# Patient Record
Sex: Female | Born: 1950
Health system: Southern US, Community
[De-identification: ages and names within clinical notes are randomized; demographics above are authoritative.]

## PROBLEM LIST (undated history)

## (undated) DIAGNOSIS — I2510 Atherosclerotic heart disease of native coronary artery without angina pectoris: Secondary | ICD-10-CM

## (undated) DIAGNOSIS — I10 Essential (primary) hypertension: Secondary | ICD-10-CM

## (undated) DIAGNOSIS — M1990 Unspecified osteoarthritis, unspecified site: Secondary | ICD-10-CM

## (undated) DIAGNOSIS — D649 Anemia, unspecified: Secondary | ICD-10-CM

## (undated) DIAGNOSIS — K5790 Diverticulosis of intestine, part unspecified, without perforation or abscess without bleeding: Secondary | ICD-10-CM

## (undated) DIAGNOSIS — K219 Gastro-esophageal reflux disease without esophagitis: Secondary | ICD-10-CM

## (undated) DIAGNOSIS — R519 Headache, unspecified: Secondary | ICD-10-CM

## (undated) DIAGNOSIS — K589 Irritable bowel syndrome without diarrhea: Secondary | ICD-10-CM

## (undated) DIAGNOSIS — M109 Gout, unspecified: Secondary | ICD-10-CM

## (undated) DIAGNOSIS — F40232 Fear of other medical care: Secondary | ICD-10-CM

## (undated) DIAGNOSIS — R000 Tachycardia, unspecified: Secondary | ICD-10-CM

## (undated) DIAGNOSIS — R011 Cardiac murmur, unspecified: Secondary | ICD-10-CM

## (undated) DIAGNOSIS — Z5189 Encounter for other specified aftercare: Secondary | ICD-10-CM

## (undated) DIAGNOSIS — K5792 Diverticulitis of intestine, part unspecified, without perforation or abscess without bleeding: Secondary | ICD-10-CM

## (undated) DIAGNOSIS — Z8719 Personal history of other diseases of the digestive system: Secondary | ICD-10-CM

## (undated) HISTORY — PX: CATARACT EXTRACTION: SUR2

## (undated) HISTORY — DX: Anemia, unspecified: D64.9

## (undated) HISTORY — DX: Irritable bowel syndrome, unspecified: K58.9

## (undated) HISTORY — DX: Diverticulitis of intestine, part unspecified, without perforation or abscess without bleeding: K57.92

## (undated) HISTORY — DX: Personal history of other diseases of the digestive system: Z87.19

## (undated) HISTORY — PX: PTCA: SHX146

## (undated) HISTORY — DX: Gastro-esophageal reflux disease without esophagitis: K21.9

## (undated) HISTORY — PX: REPLACEMENT TOTAL KNEE: SUR1224

## (undated) HISTORY — PX: VAGINAL HYSTERECTOMY: SUR661

## (undated) HISTORY — DX: Atherosclerotic heart disease of native coronary artery without angina pectoris: I25.10

## (undated) HISTORY — DX: Diverticulosis of intestine, part unspecified, without perforation or abscess without bleeding: K57.90

## (undated) HISTORY — DX: Encounter for other specified aftercare: Z51.89

## (undated) HISTORY — PX: COLON SURGERY: SHX602

## (undated) HISTORY — DX: Unspecified osteoarthritis, unspecified site: M19.90

---

## 1998-08-07 HISTORY — PX: COLONOSCOPY: SHX174

## 1999-04-18 ENCOUNTER — Other Ambulatory Visit: Admission: RE | Admit: 1999-04-18 | Discharge: 1999-04-18 | Payer: Self-pay | Admitting: Gastroenterology

## 1999-04-18 ENCOUNTER — Encounter (INDEPENDENT_AMBULATORY_CARE_PROVIDER_SITE_OTHER): Payer: Self-pay | Admitting: Specialist

## 2000-05-23 ENCOUNTER — Emergency Department (HOSPITAL_COMMUNITY): Admission: EM | Admit: 2000-05-23 | Discharge: 2000-05-23 | Payer: Self-pay | Admitting: Emergency Medicine

## 2001-08-07 HISTORY — PX: CHOLECYSTECTOMY: SHX55

## 2003-04-28 ENCOUNTER — Other Ambulatory Visit: Admission: RE | Admit: 2003-04-28 | Discharge: 2003-04-28 | Payer: Self-pay | Admitting: Family Medicine

## 2004-06-14 ENCOUNTER — Emergency Department (HOSPITAL_COMMUNITY): Admission: EM | Admit: 2004-06-14 | Discharge: 2004-06-14 | Payer: Self-pay | Admitting: Emergency Medicine

## 2004-07-15 ENCOUNTER — Ambulatory Visit (HOSPITAL_COMMUNITY): Admission: RE | Admit: 2004-07-15 | Discharge: 2004-07-15 | Payer: Self-pay | Admitting: *Deleted

## 2004-08-07 DIAGNOSIS — Z8719 Personal history of other diseases of the digestive system: Secondary | ICD-10-CM

## 2004-08-07 HISTORY — PX: COLONOSCOPY: SHX174

## 2004-08-07 HISTORY — DX: Personal history of other diseases of the digestive system: Z87.19

## 2004-08-16 ENCOUNTER — Ambulatory Visit: Payer: Self-pay | Admitting: Gastroenterology

## 2004-08-16 ENCOUNTER — Inpatient Hospital Stay (HOSPITAL_COMMUNITY): Admission: EM | Admit: 2004-08-16 | Discharge: 2004-08-22 | Payer: Self-pay | Admitting: Emergency Medicine

## 2004-09-01 ENCOUNTER — Ambulatory Visit: Payer: Self-pay | Admitting: Gastroenterology

## 2005-04-17 ENCOUNTER — Ambulatory Visit: Payer: Self-pay | Admitting: Gastroenterology

## 2005-11-13 ENCOUNTER — Ambulatory Visit: Payer: Self-pay | Admitting: Gastroenterology

## 2005-12-05 ENCOUNTER — Ambulatory Visit: Payer: Self-pay | Admitting: Family Medicine

## 2006-01-02 ENCOUNTER — Ambulatory Visit: Payer: Self-pay | Admitting: Family Medicine

## 2006-09-03 ENCOUNTER — Inpatient Hospital Stay (HOSPITAL_COMMUNITY): Admission: RE | Admit: 2006-09-03 | Discharge: 2006-09-06 | Payer: Self-pay | Admitting: Orthopedic Surgery

## 2006-09-26 ENCOUNTER — Encounter: Admission: RE | Admit: 2006-09-26 | Discharge: 2006-11-08 | Payer: Self-pay | Admitting: Orthopedic Surgery

## 2007-08-08 DIAGNOSIS — IMO0001 Reserved for inherently not codable concepts without codable children: Secondary | ICD-10-CM

## 2007-08-08 DIAGNOSIS — Z5189 Encounter for other specified aftercare: Secondary | ICD-10-CM

## 2007-08-08 HISTORY — PX: JOINT REPLACEMENT: SHX530

## 2007-08-08 HISTORY — DX: Encounter for other specified aftercare: Z51.89

## 2007-08-08 HISTORY — DX: Reserved for inherently not codable concepts without codable children: IMO0001

## 2009-07-21 ENCOUNTER — Encounter (INDEPENDENT_AMBULATORY_CARE_PROVIDER_SITE_OTHER): Payer: Self-pay | Admitting: *Deleted

## 2010-03-23 ENCOUNTER — Telehealth: Payer: Self-pay | Admitting: Gastroenterology

## 2010-08-28 ENCOUNTER — Encounter: Payer: Self-pay | Admitting: Family Medicine

## 2010-09-06 NOTE — Progress Notes (Signed)
Summary: Schedule Colonoscopy  Phone Note Outgoing Call Call back at [facility] Phone [phone]   Call placed by: Harlow Mares CMA [facility]),  March 23, 2010 11:34 AM Call placed to: Patient Summary of Call: called patient she states she never recieved a letter about her colonoscopy. She has no insurance so i gave her the number for patient assistance and she will call them and call our office back to schedule her colonoscopy if she can get some assistance.  Initial call taken by: Harlow Mares CMA [facility]),  March 23, 2010 11:36 AM

## 2010-10-17 ENCOUNTER — Telehealth (INDEPENDENT_AMBULATORY_CARE_PROVIDER_SITE_OTHER): Payer: Self-pay | Admitting: *Deleted

## 2010-10-25 NOTE — Progress Notes (Signed)
  Phone Note Other Incoming   Request: Send information Summary of Call: Request for records received from [facility]. Request forwarded to [employer].  10-2007

## 2010-10-31 ENCOUNTER — Ambulatory Visit (HOSPITAL_COMMUNITY)
Admission: RE | Admit: 2010-10-31 | Discharge: 2010-10-31 | Disposition: A | Discharge status: Home / Self Care | Payer: Self-pay | Source: Ambulatory Visit | Attending: Family Medicine | Admitting: Family Medicine

## 2010-10-31 ENCOUNTER — Other Ambulatory Visit (HOSPITAL_COMMUNITY): Payer: Self-pay | Admitting: Family Medicine

## 2010-10-31 DIAGNOSIS — M545 Low back pain, unspecified: Secondary | ICD-10-CM

## 2010-12-23 NOTE — Op Note (Signed)
NAME:  Stephanie Hammond, Stephanie Hammond             ACCOUNT NO.:  [id number]   MEDICAL RECORD NO.:  [id number]          PATIENT TYPE:  INP   LOCATION:  5029                         FACILITY:  [facility]   PHYSICIAN:  Loreta Ave, M.D. DATE OF BIRTH:  1951-06-14   DATE OF PROCEDURE:  DATE OF DISCHARGE:                               OPERATIVE REPORT   PREOPERATIVE DIAGNOSIS:  Endstage degenerative arthritis, both knees,  right greater than left.   POSTOPERATIVE DIAGNOSIS:  Endstage degenerative arthritis, both knees,  right greater than left.   OPERATION/PROCEDURE:  1. Right total knee replacement with Stryker prosthesis. Cemented #4      Triathlon femoral component pegged, posterior stabilized.  Cemented      #4 tibial component with a 9-mm polyethylene posterior stabilized      insert. Resurfacing 32 mm x 10 mm medial offset patellar component.      Soft tissue balancing.  2. Intraarticular injection left knee with Depo-Medrol and Marcaine.   SURGEON:  Loreta Ave, M.D.   ASSISTANT:  Genene Churn. Barry Dienes, [place] .  Present throughout the entire case.   ANESTHESIA:  General.   ESTIMATED BLOOD LOSS:  Minimal.   TOURNIQUET TIME:  1 hour 30 minutes.   SPECIMENS:  None.   CULTURES:  None.   COMPLICATIONS:  None.   DRESSINGS:  Soft compressive with knee immobilizer.   DRAINS:  Hemovac x1.   PROCEDURE:  The patient brought to the operating room and after adequate  anesthesia had been obtained, both knees examined.  Moderate exogenous  obesity.  Both knees full extension flexion limited to about 100 degrees  both sides, both in slight varus.  Attention turned to the left knee.  Under sterile technique, injected intraarticular with Depo-Medrol 80 mg  and Marcaine 0.5% with epinephrine 4 mL.  Band-aid applied.   Attention to the right knee.  Tourniquet applied.  Prepped and draped in  the usual sterile fashion.  Exsanguinated with elevation Esmarch,  tourniquet to 350 mmHg. A skin incision  above the patella down to the  tibial tubercle.  The skin and subcutaneous tissue divided.  Her body  habitus and obesity really did not allow for a minimally invasive  approach.  Medial arthrotomy extending up into the quadriceps tendon.  Knee exposed.  There are grade IV changes throughout.  Remnants of  menisci, cruciate ligament, loose body.  Articular spurs menisci  excised.  Distal femoral cut with intramedullary guide resecting 10 mm  set at 5 degrees of valgus.  Epicondylar axis marked.  Sized for #4  component.  Jigs put in place, definitive cuts made.   Attention turned to the tibia.  Extramedullary guide, 3-degree posterior  is slope cut, resecting out 13 mm off the peak of the tibial spine.  Sized to a #4 component.  The patella was everted, measured and the  posterior 10 mm resected.  Sized and drilled for a 32-mm component.  All  recess examined.  All remnants of spurs, menisci removed.  Wound  irrigated.  Trials put in place; #4 on the femur, #4 on the tibia and  the  32-mm on the patella with a 9-mm insert.  Full extension and full  flexion.  Good alignment, good stability, good patellofemoral  tracking.  Tibia was marked for rotation and then reamed for the tibial component.  All trials removed. Copious irrigation with the pulse irrigating device.  Cement prepared placed on all components.  All components firmly seated.  Excessive cement removed.  Polyethylene attached to the tibial component  and the knee reduced.  Once the cement hardened, the knee was  reexamined.  Full extension and full flexion good.  Alignment good.  Stability good.  Patellofemoral tracking.  Hemovac placed through the a  simple stab wound.  Wound irrigated.  Arthrotomy closed with #1 Vicryl,  skin and subcutaneous tissue with Vicryl and staples.  Knee injected  with Marcaine and a Hemovac clamp.  Sterile compressive dressing  applied.  Tourniquet deflated and removed.  Knee immobilizer applied.   Anesthesia reversed.  Brought to recovery room.  Tolerated surgery well,  no complications.      Loreta Ave, M.D.  Electronically Signed     DFM/MEDQ  D:  09/04/2006  T:  09/05/2006  Job:  [id number]

## 2010-12-23 NOTE — Discharge Summary (Signed)
NAME:  Stephanie Hammond, Stephanie Hammond             ACCOUNT NO.:  [id number]   MEDICAL RECORD NO.:  [id number]          PATIENT TYPE:  INP   LOCATION:  5029                         FACILITY:  [facility]   PHYSICIAN:  Loreta Ave, M.D. DATE OF BIRTH:  1950-08-17   DATE OF ADMISSION:  09/03/2006  DATE OF DISCHARGE:  09/06/2006                               DISCHARGE SUMMARY   FINAL DIAGNOSES:  1. Status post right total knee replacement for end-stage degenerative      joint disease.  2. Diverticulosis.   HISTORY OF PRESENT ILLNESS:  A 60 year old black female with a history  of end-stage degenerative joint disease, right knee, with chronic pain.  Presented to our office for prep/evaluation for total knee replacement.  She had progressive worsening pain which failed conservative treatment.  Significant decrease in her daily activities.   HOSPITAL COURSE:  On September 03, 2006, the patient was taken to the  [facility] OR, and right total knee replacement procedure performed.   SURGEON:  Dr. Reuel Boom.  __________   ASSISTANT:  Zonia Kief, PA-C.   ANESTHESIA:  General.   SPECIMENS:  None.   ESTIMATED BLOOD LOSS:  Minimal.   TOURNIQUET TIME:  80 minutes.   Hemovac drain placed x1.  There were no surgical or anesthesia  complications, and patient was transferred to recovery in stable  condition.  October 05, 2006 patient doing well.  Vital signs stable.  Afebrile.  Hemoglobin 11.0.  He has a history of diverticulosis and  previous admission for GI bleed.  She was given Lovenox 40 mg subcu  injections for DVT prophylaxis.  __________ Neurovascularly intact.  Started PT/OT.  September 05, 2006 patient doing well, with good pain  control.  No specific complaints __________.  Vital signs stable.  Afebrile.  Hemoglobin 10.1.  Wound looks good.  Staples intact.  Discontinue Hemovac drain.  No signs of infection.  Calf nontender.  Neurovascularly intact.  Discontinue the PCA, Foley.  Saline lock IV.  September 06, 2006 patient doing well.  Progressed good with therapy, and  states that she is ready to go home.  Temp 97.6.  Pulse 104.  Respirations 20.  Blood pressure 135/75.  Wound looked good, with  staples intact.  No drainage or signs of infection.  Calf nontender.   CONDITION:  Good and stable.   DISPOSITION:  Discharged home.   DISCHARGE MEDICATIONS:  1. Percocet 7.5/325 tabs p.o. q.4-6 hours p.r.n. for pain.  2. Lovenox 40 mg subcu injection daily x3 weeks for DVT prophylaxis.  3. Robaxin 500 mg 1 tab p.o. q.6 hours p.r.n. for spasms.   DISCHARGE INSTRUCTIONS:  Patient will work with home health PT and OT to  improve range of motion, strength, and ambulation.  Daily dressing  changes with 4 x 4, gauze and tape.  Follow up in 2 weeks postop for  recheck.  Return sooner if needed.      Loreta Ave, M.D.  Electronically Signed     DFM/MEDQ  D:  01/07/2007  T:  01/07/2007  Job:  [id number]

## 2010-12-23 NOTE — Discharge Summary (Signed)
NAMEMarland Kitchen  SAHANA, GIANNATTASIO             ACCOUNT NO.:  [id number]   MEDICAL RECORD NO.:  [id number]          PATIENT TYPE:  INP   LOCATION:  0163                         FACILITY[facility]  [facility]   PHYSICIAN:  Wilhemina Bonito. Marina Goodell, M.D. [facility] OF BIRTH:  Apr 13, 1951   DATE OF ADMISSION:  08/16/2004  DATE OF DISCHARGE:  08/22/2004                                 DISCHARGE SUMMARY   ADMITTING DIAGNOSES:  47.  A 60 year old female with an acute gastrointestinal bleed, suspect      diverticular, rule out nonsteroidal anti-inflammatory drugs-induced      small bowel or colonic ulcer or possibly arterial venous malformation      with bleeding aggravated by nonsteroidal anti-inflammatory drugs use.  2.  Coronary artery disease, status post PTCA approximately 10 years ago.  3.  Osteoarthritis.  4.  Status post hysterectomy and cholecystectomy.   DISCHARGE DIAGNOSES:  1.  Stable status post acute major lower gastrointestinal bleed,      diverticular.  2.  Severe anemia secondary to above.  3.  Syncopal episode in hospital secondary to above.  4.  History of colon polyps.  5.  Coronary artery disease, status post PTCA approximately 10 years ago.  6.  Osteoarthritis.  7.  Status post hysterectomy and cholecystectomy.   PROCEDURE:  Colonoscopy.   CONSULTATIONS:  None.   PROCEDURES:  None.   BRIEF HISTORY:  Stephanie Hammond is a pleasant 87 year old African-American female,  known to the [facility] and also had had prior  colonoscopy at [facility] within the past few years.  She relates that she had  been taking Advil on a regular basis for knee pain up to 4 tablets per day  and had also been on Naprosyn which she stopped approximately a week ago.  She had a cortisone injection in her knee last week which had been somewhat  helpful.  She had not had any previous problems with bleeding but was told  at the time of previous colonoscopy that she had two polyps which were  removed.  At this time, she  had had abrupt onset of painless, dark red blood  per rectum at about 3 p.m. and then had six further episodes over the next  few hours.  She was seen by Dr. Montey Hora in [place] and then sent to  the [facility] Emergency Room for evaluation and admission.  On admission,  her hemoglobin was 12.8, BUN was 16, coags were unremarkable.  She was  tachycardic but otherwise hemodynamically stable.   LABORATORY STUDIES:  On January 10, hemoglobin 12.8, hematocrit 36.4, MCV  89.5, platelets 383.  Serial values were obtained.  On January 12,  hemoglobin was 9.7, hematocrit of 27.6.  On January 13, hemoglobin 7.4,  hematocrit of 20.  On January 14, hemoglobin 11.5, hematocrit of 32.6 and on  January 15, hemoglobin 10.5, hematocrit of 29.4 and on January 16,  hemoglobin 9.9, hematocrit 28.2, platelets 291, pro time 12.7, INR 0.9,  electrolytes within normal limits.  BUN on admission 16, creatinine 0.9,  albumin 3.  Liver function studies normal.  UA showed 3-6 WBCs, 0-2  RBCs.   HOSPITAL COURSE:  The patient was admitted to the service of Dr. Claudette Head, who was covering on call.  She was initially placed on telemetry  unit, started with an IV fluid bolus, and then continued on maintenance IV  fluids.  She was covered with IV Protonix and had serial H&Hs. drawn.  Initial hemoglobin was 12.8.  The following morning, she was feeling fine,  had had two bloody stools during the night but no dizziness, shortness of  breath, etc.  Hemoglobin had dropped from 12.8 down to 9.8.  We did discuss  transfusion but have held off at her request.  On January 12, hemoglobin was  quite stable at 9.7.  We reviewed her office records showing she had had  colonoscopy in September 2000 showing marked diverticulosis of the left  colon and two right colon polyps which were removed.  It was decided that  she was due for follow up colonoscopy, and the patient preferred to proceed  with this while in the hospital.   On the evening of January 12, she was  prepping for colonoscopy and had an episode of weakness while in the  bathroom and then apparent syncope.  She was attended to by the nurses, was  arousable, actively having bowel movements, and then had an episode of  vomiting and thereafter stabilized.  Her hemoglobin was repeated stat and at  that time, was 7.4.  She was transfused 2 units of packed RBCs and  transferred to the step-down unit, kept at bed rest, and eventually was  given a third unit of packed RBCs.  She did not have any further bleeding  thereafter, underwent colonoscopy with Dr. Marina Goodell on the 13th showing severe  pandiverticulosis, with blood scattered throughout the colon but no  significant active bleeding.  She was watched for another 48 hours in the  hospital, had no further evidence of bleeding.  Her diet was gradually  advanced.  She tolerated this without difficulty and was discharged home on  the 16th in a stable and improved condition.  She was asked to avoid all  aspirin and NSAIDs, to remain out of work until the week of January 23.  She  was to call for any problems with recurrent bleeding and to follow up with  Dr. Russella Dar in the office on Thursday, January 26, at 10 a.m.  She will need  repeat hemoglobin at that time.  Again, hemoglobin on discharge was 9.9.      AE/MEDQ  D:  08/31/2004  T:  08/31/2004  Job:  [id number]

## 2010-12-23 NOTE — H&P (Signed)
NAME:  Stephanie Hammond, Stephanie Hammond             ACCOUNT NO.:  [id number]   MEDICAL RECORD NO.:  [id number]          PATIENT TYPE:  INP   LOCATION:  0345                         FACILITY:  [facility]   PHYSICIAN:  Malcolm T. Russella Dar, M.D. [facility] OF BIRTH:  1951/01/10   DATE OF ADMISSION:  08/16/2004  DATE OF DISCHARGE:                                HISTORY & PHYSICAL   CHIEF COMPLAINT:  Acute onset of rectal bleeding.   HISTORY:  Stephanie Hammond is a pleasant 60 year old African American female known to  [facility], Dr. Elease Hashimoto, and also had had a previous  colonoscopy at [facility] within the past few years.  The patient relates that  she has been taking Advil on a regular basis for knee pain, up to four/day.  She had also been on Naprosyn, which she stopped one week ago.  She had had  a cortisone injection in her knee last week, which was somewhat helpful.  The patient has not had any prior problems with bleeding.  She says that she  was told that she had two polyps at the time of her colonoscopy and that  these were removed.  At this point, she developed abrupt onset of painless  dark red blood and clots/rectum at about three p.m. and had about six  episodes over a four hour period.  She was seen acutely by Dr. Montey Hora  in [place] and then sent to [facility] Emergency Room for evaluation and  admission.  She was seen by Dr. Russella Dar and evaluated in the ER and admitted  with an acute lower GI bleed for supportive management.   The patient did undergo a cardiac catheterization in December 2005 that  showed normal coronaries.  Apparently she did have an angioplasty  approximately 10 years ago.  The patient was hemodynamically stable at the  time of admission but tachycardiac with a pulse of 125.  Admission  hemoglobin was 12.8, BUN 16.  Coags within normal limits.   MEDICATIONS:  None on a regular basis.  1.  Advil and Naprosyn as above, both of which she has for the most part  stopped, though says that she did take three Advil yesterday.   ALLERGIES:  PENICILLIN, which causes hives and swelling.   PAST MEDICAL HISTORY:  Pertinent for:  1.  Osteoarthritis.  2.  Coronary artery disease status post PTCA approximately 10 years ago.  3.  Smoker.  4.  Colon polyps.  5.  She is status post a remote hysterectomy done about 29 years ago.  6.  Cholecystectomy 2003.   SOCIAL HISTORY:  The patient is married and lives with her husband.  She is  employed as a Surveyor, mining for [employer].  She is a  smoker, 1-1/2 packs/day.  No ETOH.   FAMILY HISTORY:  Negative for colon cancer or polyps.  Otherwise negative  per patient.   REVIEW OF SYSTEMS:  CARDIOVASCULAR:  The patient denies any chest pain or  anginal symptoms.  PULMONARY:  Negative for cough, shortness of breath, or  sputum production.  GENITOURINARY:  Denies.  GASTROINTESTINAL:  As  above.  MUSCULOSKELETAL:  Pertinent for bilateral knee pain, for which she has been  taking anti-inflammatories.   LABORATORY STUDIES:  On admission, WBC 7.7, hemoglobin 12.8, hematocrit of  36.4, MCV of 89.5, platelets 383.  Pro time 12.7, INR of 0.9.  Sodium 140,  potassium 4.4, BUN 16, creatinine 0.9, albumin 3.  Liver function studies  normal.   PHYSICAL EXAMINATION:  GENERAL:  A well-developed obese African American  female in no acute distress.  Alert and oriented x3.  Pleasant.  VITAL SIGNS:  Blood pressure 125/70, pulse 125, respirations 16, temperature  99.2.  HEENT:  Nontraumatic, normocephalic.  EOMI.  PERRLA.  Sclerae anicteric.  NECK:  No JVD or bruits.  CARDIOVASCULAR:  Regular rate and rhythm with S1 and S2.  Tachycardiac.  CHEST:  Clear.  ABDOMEN:  Large, soft, nontender, nondistended.  Normal active bowel sounds.  No palpable mass or hepatosplenomegaly.  RECTAL:  Without lesion.  Bright red blood/rectum per Dr. Dimple Casey and not  repeated.  NEUROLOGIC:  Grossly nonfocal.   IMPRESSION:  68.  A  60 year old female with an acute gastrointestinal bleed.  Suspect      diverticular.  Rule out NSAID induced small bowel or colonic ulcer or      possibly arteriovenous malformations, with the bleeding aggravated by      NSAID use.  Doubt upper gastrointestinal bleed at this time but must be      considered with NSAID use.  2.  Coronary artery disease status post percutaneous transluminal coronary      angioplasty 10 years ago.  3.  Osteoarthritis.  4.  Status post hysterectomy and cholecystectomy.   PLAN:  The patient is admitted to the service of Dr. Claudette Head for IV  fluid hydration, serial H&H, and transfusions as indicated.  We will obtain  her previous colonoscopy report and then depending on the timing of this and  her course, decide on the need for repeat colonoscopy.  If her BUN rises, we  will also need to consider an EGD to rule out an upper source, though doubt  at present.  For details please see the orders.      AE/MEDQ  D:  08/17/2004  T:  08/17/2004  Job:  [id number]   cc:   Magnus Sinning. Dimple Casey, M.D.  [address] [place]  [place] [address]  Fax: [phone]

## 2010-12-23 NOTE — H&P (Signed)
NAME:  Stephanie Hammond, Stephanie Hammond             ACCOUNT NO.:  [id number]   MEDICAL RECORD NO.:  [id number]          PATIENT TYPE:  EMS   LOCATION:  MAJO                         FACILITY:  [facility]   PHYSICIAN:  Vesta Mixer, M.D. DATE OF BIRTH:  [birth year]   DATE OF ADMISSION:  06/14/2004  DATE OF DISCHARGE:                                HISTORY & PHYSICAL   Ms. Tolson is a 60 year old female with a previous history of coronary  artery disease. She is status post percutaneous transluminal coronary  angioplasty approximately ten years ago. She is admitted  to the ER now with  episodes of chest pain.   The patient has been seen by her primary medical doctor for years. She has  not had any recurrent chest pain since her angioplasty 10 years ago. She  does not get any regular exercise. She occasionally has some chest  discomfort when she brings the groceries in or if she climbs stairs.   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 The pain was associated with  some diaphoresis, but did not radiate. It was not really associated with any  shortness of breath. The pain subsided after several minutes and she  presented to her doctor's office at [facility]. She  was transferred to the [facility] emergency room for further evaluation.   She has been pain free since she arrived. She did not require any  nitroglycerin.   CURRENT MEDICATIONS:  None.   ALLERGIES:  PENICILLIN.   PAST MEDICAL HISTORY:  History of coronary artery disease. She is status  post PTCA approximately ten years ago.   SOCIAL HISTORY:  The patient smokes one and a half packs of cigarettes a  day. She does not drink alcohol.   REVIEW OF SYSTEMS:  The patient denies having any problems with eyes, nose,  or throat. She denies any heat or cold intolerance, weight gain or weight  loss. She denies any cough or sputum production. Review  of systems is  reviewed and is essentially negative.   PHYSICAL EXAMINATION:  GENERAL: She is a middle-age female in no acute  distress. She is alert and oriented times three, and her mood and affect are  normal.  VITAL SIGNS: Heart rate is 80, blood pressure 124/71, and she is afebrile.  HEENT: 2+ carotids. She has no bruits. There is no JVD or thyromegaly.  LUNGS: Clear to auscultation.  HEART: Regular rate. S1 and S2. No murmurs, rubs, or gallops.  ABDOMEN: Good bowel sounds and is nontender.  EXTREMITIES: She has no clubbing, cyanosis, or edema.  NEUROLOGIC: Nonfocal.   Her EKG reveals a normal sinus rhythm. She has no ST-T wave changes. Her  initial point of care cardiac markers are negative.   Will admit her for observation. Will get a stress Cardiolite study in the  morning (most likely an adenosine Cardiolite). She seems to be at fairly low  risk for the time being, but she does have a history of coronary artery  disease in the past. We will collect  cardiac enzymes and will proceed with  an adenosine Cardiolite tomorrow.       PJN/MEDQ  D:  06/14/2004  T:  06/14/2004  Job:  [id number]   cc:   [facility]

## 2010-12-23 NOTE — Cardiovascular Report (Signed)
NAMEMarland Hammond  AVORY, RAHIMI             ACCOUNT NO.:  [id number]   MEDICAL RECORD NO.:  [id number]          PATIENT TYPE:  OIB   LOCATION:  2899                         FACILITY:  [facility]   PHYSICIAN:  Vesta Mixer, M.D. DATE OF BIRTH:  02-Jan-1951   DATE OF PROCEDURE:  07/15/2004  DATE OF DISCHARGE:                              CARDIAC CATHETERIZATION   Ms. Carbonell is a 60 year old female with history of chest pains.  She  recently had a stress Cardiolite study which revealed an anterior lateral  defect.  It was difficult to tell whether this was due to a breast artifact  versus ischemia or previous infarct.  She reported having had an angioplasty  in the past.  She was scheduled for a left heart catheterization for further  evaluation.   PROCEDURE:  Left heart catheterization with coronary angiography.   The right femoral artery was easily cannulated using the modified Seldinger  technique.   HEMODYNAMICS:  The left ventricular pressure was 133/21 with an aortic  pressure of 133/82.   CORONARY ANGIOGRAPHY:  1.  Left main coronary artery is smooth and normal.  2.  The left anterior descending artery is smooth and normal.  There is      moderate size diagonal vessel which is also smooth and normal.  3.  The left circumflex vessel is normal vessel.  It is fairly tortuous.  4.  The right coronary artery is relatively small, but is otherwise normal.      It gives off a very small posterior lateral branch and a posterior      descending.   The left ventriculogram was performed in a 30 RAO position. It reveals  normal left ventricular systolic function with ejection fraction of around  70%.  There is no mitral regurgitation.   COMPLICATIONS:  None.   CONCLUSION:  1.  Smooth and normal coronary arteries.  2.  Normal left ventricular systolic function.   We will continue with medical therapy.      PJN/MEDQ  D:  07/15/2004  T:  07/15/2004  Job:  [id number]   cc:   [facility]

## 2011-03-02 ENCOUNTER — Encounter: Payer: Self-pay | Admitting: Gastroenterology

## 2011-04-03 ENCOUNTER — Ambulatory Visit: Payer: Self-pay | Admitting: Gastroenterology

## 2011-04-04 ENCOUNTER — Encounter: Payer: Self-pay | Admitting: Gastroenterology

## 2011-04-04 ENCOUNTER — Encounter: Payer: Self-pay | Admitting: Internal Medicine

## 2012-01-18 ENCOUNTER — Other Ambulatory Visit (HOSPITAL_COMMUNITY): Payer: Self-pay | Admitting: Family Medicine

## 2012-01-18 DIAGNOSIS — Z139 Encounter for screening, unspecified: Secondary | ICD-10-CM

## 2012-01-30 ENCOUNTER — Ambulatory Visit (HOSPITAL_COMMUNITY): Payer: Self-pay

## 2012-02-16 ENCOUNTER — Encounter: Payer: Self-pay | Admitting: Gastroenterology

## 2012-03-26 ENCOUNTER — Ambulatory Visit: Payer: Self-pay | Admitting: Family Medicine

## 2012-03-28 ENCOUNTER — Encounter: Payer: Self-pay | Admitting: Family Medicine

## 2012-03-28 ENCOUNTER — Ambulatory Visit (INDEPENDENT_AMBULATORY_CARE_PROVIDER_SITE_OTHER): Payer: Medicare PPO | Admitting: Family Medicine

## 2012-03-28 VITALS — BP 150/90 | HR 110 | Resp 15 | Ht 63.0 in | Wt 223.8 lb

## 2012-03-28 DIAGNOSIS — Z720 Tobacco use: Secondary | ICD-10-CM

## 2012-03-28 DIAGNOSIS — E119 Type 2 diabetes mellitus without complications: Secondary | ICD-10-CM

## 2012-03-28 DIAGNOSIS — M5137 Other intervertebral disc degeneration, lumbosacral region: Secondary | ICD-10-CM

## 2012-03-28 DIAGNOSIS — E1149 Type 2 diabetes mellitus with other diabetic neurological complication: Secondary | ICD-10-CM | POA: Insufficient documentation

## 2012-03-28 DIAGNOSIS — G8929 Other chronic pain: Secondary | ICD-10-CM | POA: Insufficient documentation

## 2012-03-28 DIAGNOSIS — F17200 Nicotine dependence, unspecified, uncomplicated: Secondary | ICD-10-CM | POA: Insufficient documentation

## 2012-03-28 DIAGNOSIS — E669 Obesity, unspecified: Secondary | ICD-10-CM

## 2012-03-28 DIAGNOSIS — M25569 Pain in unspecified knee: Secondary | ICD-10-CM

## 2012-03-28 DIAGNOSIS — IMO0001 Reserved for inherently not codable concepts without codable children: Secondary | ICD-10-CM

## 2012-03-28 DIAGNOSIS — R000 Tachycardia, unspecified: Secondary | ICD-10-CM

## 2012-03-28 DIAGNOSIS — I2510 Atherosclerotic heart disease of native coronary artery without angina pectoris: Secondary | ICD-10-CM

## 2012-03-28 DIAGNOSIS — R030 Elevated blood-pressure reading, without diagnosis of hypertension: Secondary | ICD-10-CM

## 2012-03-28 DIAGNOSIS — M5136 Other intervertebral disc degeneration, lumbar region: Secondary | ICD-10-CM | POA: Insufficient documentation

## 2012-03-28 MED ORDER — TRAMADOL HCL 50 MG PO TABS: 50.0000 mg | ORAL_TABLET | Freq: Three times a day (TID) | ORAL | Status: DC | PRN

## 2012-03-28 NOTE — Assessment & Plan Note (Signed)
Start ultram

## 2012-03-28 NOTE — Assessment & Plan Note (Signed)
Pt with CAD disease, normal Cath in 2005. No ASA no beta blocker no statin. Pt interestingly did not remember her heart catherizations or CAD.

## 2012-03-28 NOTE — Patient Instructions (Signed)
Get the labs done fasting  Start the medication for back pain three times a day as needed Continue current medications  Work on the smoking! F/U 3 weeks for medications and labs

## 2012-03-28 NOTE — Assessment & Plan Note (Signed)
Pt states she has had chronic tachycardia, she would benefit from Beta blockage to with history of CAD

## 2012-03-28 NOTE — Progress Notes (Signed)
Subjective:    Patient ID: Stephanie Hammond, female    DOB: 1951/06/02, 61 y.o.   MRN: [id number]  HPI Pt here to establish care, previous PCP [facility], she has also seen Dr. Mirna Mires for 1 visit. Medications Reviewed and history reviewed Overdue for Mammogram  DM- currently on lantus 30 units and metformin, she takes her insulin sporadically depending on her fasting blood sugar. Last A1C unknown, no ACEI No history of HTN per pt, no hyperlipidemia per pt, though records report history of CAD with PTCA in 1990's, pt states she forgot about that history She also has history of multiple colon polyps and was treated for GI bleed in 2006 from bleeding polyp, she has recently reestablished with Dr. Russella Dar in [place]. 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  Review of Systems  GEN- denies fatigue, fever, weight loss,weakness, recent illness HEENT- denies eye drainage, change in vision, nasal discharge, CVS- denies chest pain, palpitations RESP- denies SOB, cough, wheeze ABD- denies N/V, change in stools, abd pain GU- denies dysuria, hematuria, dribbling, incontinence MSK- +joint pain, muscle aches, injury Neuro- denies headache, dizziness, syncope, seizure activity      Objective:   Physical Exam GEN- NAD, alert and oriented x3 HEENT- PERRL, EOMI, non injected sclera, pink conjunctiva, MMM, oropharynx clear Neck- Supple, no thryomegaly CVS- Tachycardic HR 110, no murmur RESP-CTAB ABD-NABS,soft, NT,ND Back- TTP lumbar region, antalgic gait, Stiff ROM  EXT- No edema Pulses- Radial, DP- 2+ Psych-normal affect and mood        Assessment & Plan:

## 2012-03-28 NOTE — Assessment & Plan Note (Signed)
Based on todays exam she likley has HTN, recheck at visit, labs to be done, will start ACEI next visit

## 2012-03-28 NOTE — Assessment & Plan Note (Signed)
She is not complaint with her Lantus prescribing, obtain A1C and then adjust regimen

## 2012-04-15 ENCOUNTER — Encounter: Payer: Self-pay | Admitting: Gastroenterology

## 2012-04-18 ENCOUNTER — Ambulatory Visit: Payer: Medicare PPO | Admitting: Family Medicine

## 2012-04-25 ENCOUNTER — Encounter: Payer: Self-pay | Admitting: Gastroenterology

## 2012-04-25 ENCOUNTER — Ambulatory Visit (AMBULATORY_SURGERY_CENTER): Payer: Medicare Other | Admitting: *Deleted

## 2012-04-25 VITALS — Ht 63.0 in | Wt 222.0 lb

## 2012-04-25 DIAGNOSIS — Z1211 Encounter for screening for malignant neoplasm of colon: Secondary | ICD-10-CM

## 2012-04-25 MED ORDER — MOVIPREP 100 G PO SOLR: ORAL | Status: DC

## 2012-04-25 NOTE — Progress Notes (Signed)
Stephanie Hammond denies her sister had colon cancer.  I questioned her about it and she said she may have had polyps but not colon cancer.

## 2012-05-02 ENCOUNTER — Ambulatory Visit: Payer: Medicare PPO | Admitting: Family Medicine

## 2012-05-03 ENCOUNTER — Telehealth: Payer: Self-pay | Admitting: Gastroenterology

## 2012-05-03 NOTE — Telephone Encounter (Signed)
No charge this time but will not support another late cancellation without charge

## 2012-05-07 ENCOUNTER — Encounter: Payer: Self-pay | Admitting: Gastroenterology

## 2012-05-09 ENCOUNTER — Telehealth: Payer: Self-pay | Admitting: Gastroenterology

## 2012-05-09 NOTE — Telephone Encounter (Signed)
Yes charge. See note from 9/27

## 2012-05-13 ENCOUNTER — Encounter: Payer: Self-pay | Admitting: Gastroenterology

## 2012-05-27 LAB — COMPREHENSIVE METABOLIC PANEL
ALT: 20 U/L (ref 0–35)
AST: 18 U/L (ref 0–37)
Albumin: 4.5 g/dL (ref 3.5–5.2)
Alkaline Phosphatase: 115 U/L (ref 39–117)
BUN: 13 mg/dL (ref 6–23)
CO2: 27 mEq/L (ref 19–32)
Calcium: 10 mg/dL (ref 8.4–10.5)
Chloride: 105 mEq/L (ref 96–112)
Creat: 0.95 mg/dL (ref 0.50–1.10)
Glucose, Bld: 93 mg/dL (ref 70–99)
Potassium: 5 mEq/L (ref 3.5–5.3)
Sodium: 142 mEq/L (ref 135–145)
Total Bilirubin: 0.4 mg/dL (ref 0.3–1.2)
Total Protein: 7.8 g/dL (ref 6.0–8.3)

## 2012-05-27 LAB — CBC
HCT: 40.2 % (ref 36.0–46.0)
Hemoglobin: 14.1 g/dL (ref 12.0–15.0)
MCH: 31.2 pg (ref 26.0–34.0)
MCHC: 35.1 g/dL (ref 30.0–36.0)
MCV: 88.9 fL (ref 78.0–100.0)
Platelets: 400 10*3/uL (ref 150–400)
RBC: 4.52 MIL/uL (ref 3.87–5.11)
RDW: 14 % (ref 11.5–15.5)
WBC: 5.8 10*3/uL (ref 4.0–10.5)

## 2012-05-27 LAB — LIPID PANEL
Cholesterol: 181 mg/dL (ref 0–200)
HDL: 42 mg/dL (ref >39)
LDL Cholesterol: 119 mg/dL — ABNORMAL HIGH (ref 0–99)
Total CHOL/HDL Ratio: 4.3 Ratio
Triglycerides: 102 mg/dL (ref <150)
VLDL: 20 mg/dL (ref 0–40)

## 2012-05-27 LAB — HEMOGLOBIN A1C
Hgb A1c MFr Bld: 5.5 % (ref <5.7)
Mean Plasma Glucose: 111 mg/dL (ref <117)

## 2012-05-27 LAB — TSH: TSH: 1.384 u[IU]/mL (ref 0.350–4.500)

## 2012-06-07 ENCOUNTER — Ambulatory Visit (INDEPENDENT_AMBULATORY_CARE_PROVIDER_SITE_OTHER): Payer: Medicare Other | Admitting: Family Medicine

## 2012-06-07 ENCOUNTER — Encounter: Payer: Self-pay | Admitting: Family Medicine

## 2012-06-07 VITALS — BP 170/90 | HR 106 | Resp 15 | Ht 63.0 in | Wt 225.0 lb

## 2012-06-07 DIAGNOSIS — I10 Essential (primary) hypertension: Secondary | ICD-10-CM

## 2012-06-07 DIAGNOSIS — M51369 Other intervertebral disc degeneration, lumbar region without mention of lumbar back pain or lower extremity pain: Secondary | ICD-10-CM

## 2012-06-07 DIAGNOSIS — E119 Type 2 diabetes mellitus without complications: Secondary | ICD-10-CM

## 2012-06-07 DIAGNOSIS — I2510 Atherosclerotic heart disease of native coronary artery without angina pectoris: Secondary | ICD-10-CM

## 2012-06-07 DIAGNOSIS — Z1211 Encounter for screening for malignant neoplasm of colon: Secondary | ICD-10-CM

## 2012-06-07 DIAGNOSIS — M5137 Other intervertebral disc degeneration, lumbosacral region: Secondary | ICD-10-CM

## 2012-06-07 DIAGNOSIS — M5136 Other intervertebral disc degeneration, lumbar region: Secondary | ICD-10-CM

## 2012-06-07 DIAGNOSIS — Z1239 Encounter for other screening for malignant neoplasm of breast: Secondary | ICD-10-CM

## 2012-06-07 MED ORDER — SIMVASTATIN 10 MG PO TABS: 10.0000 mg | ORAL_TABLET | Freq: Every evening | ORAL | Status: DC

## 2012-06-07 MED ORDER — HYDROCODONE-ACETAMINOPHEN 5-325 MG PO TABS: 1.0000 | ORAL_TABLET | Freq: Four times a day (QID) | ORAL | Status: DC | PRN

## 2012-06-07 MED ORDER — LISINOPRIL-HYDROCHLOROTHIAZIDE 20-12.5 MG PO TABS: 1.0000 | ORAL_TABLET | Freq: Every day | ORAL | Status: DC

## 2012-06-07 NOTE — Patient Instructions (Addendum)
Stop the lantus Continue metformin twice a day  If your fastings are > 200 call me  New blood pressure medication once a day in morning New pain pill Cholesterol medication at bedtime  Referral to see Dr. Eduard Clos Referral to Dr. Darrick Penna for Colonoscopy  Mammogram ( Tues/Thurs)  F/U Physical with PAP Smear 4 weeks

## 2012-06-07 NOTE — Progress Notes (Signed)
Subjective:    Patient ID: [name], female    DOB: Jun 17, 1951, 61 y.o.   MRN: [id number]  HPI Patient here to follow chronic medical problems. She's also here to review her lab work. She is complaining of worsening back pain. This at the tramadol does not help. She has degenerative disc disease in her lower back. No new radicular symptoms Diabetes mellitus her blood sugars fasting are very good in the low 100s or less. Many days she does not even take the Lantus because she states her blood sugars too low and often she will only take one metformin if she thinks her blood sugars too low. She's been checked her blood pressure at home and noticed it is been very high. She's also been having a mild headache with the elevated blood pressure.   Review of Systems  GEN- denies fatigue, fever, weight loss,weakness, recent illness HEENT- denies eye drainage, change in vision, nasal discharge, CVS- denies chest pain, palpitations RESP- denies SOB, cough, wheeze ABD- denies N/V, change in stools, abd pain GU- denies dysuria, hematuria, dribbling, incontinence MSK- + joint pain, muscle aches, injury Neuro- denies headache, dizziness, syncope, seizure activity      Objective:   Physical Exam GEN- NAD, alert and oriented x3 HEENT- PERRL, EOMI, non injected sclera, pink conjunctiva, MMM, oropharynx clear, fundoscopic  Neck- Supple,  CVS- Tachycardic , no murmur RESP-CTAB ABD-NABS,soft, NT,ND Back- TTP lumbar region, antalgic gait,Decreased ROM , neg SLR EXT- No edema Pulses- Radial, DP- 2+        Assessment & Plan:

## 2012-06-09 DIAGNOSIS — I10 Essential (primary) hypertension: Secondary | ICD-10-CM | POA: Insufficient documentation

## 2012-06-09 NOTE — Assessment & Plan Note (Signed)
A1C shows excellent control, she is being over treated although she is not consistent with medications anyway I will stop her lantus all together, she will continue metformin twice a day Recheck in 3 months

## 2012-06-09 NOTE — Assessment & Plan Note (Signed)
Start lisinopril HCTZ once a day 

## 2012-06-09 NOTE — Assessment & Plan Note (Addendum)
Worsening back pain, low dose narcotic Vicodin given, refer for epidural injections vs other modalities, Dr. West Bali has been on NSAIDS, ultram

## 2012-06-09 NOTE — Assessment & Plan Note (Signed)
LDL > 100, goal around 70-80, start low dose statin

## 2012-06-12 ENCOUNTER — Telehealth: Payer: Self-pay

## 2012-06-12 NOTE — Telephone Encounter (Signed)
Patient aware.

## 2012-06-12 NOTE — Telephone Encounter (Signed)
SHe needs to continue current dose of medication. She can take either 1-2 tablets but be conscious of how many pills she has. She also try taking 1 pain pill with ibuprofen ( 400-600mg ) over the counter

## 2012-06-13 ENCOUNTER — Telehealth: Payer: Self-pay | Admitting: Family Medicine

## 2012-06-13 NOTE — Telephone Encounter (Signed)
Patient is aware 

## 2012-06-19 ENCOUNTER — Telehealth: Payer: Self-pay | Admitting: *Deleted

## 2012-06-19 NOTE — Telephone Encounter (Signed)
Stephanie Hammond called today regarding the letter she received from you regarding her colonoscopy. She would like for you to call her back. Thanks.

## 2012-06-19 NOTE — Telephone Encounter (Signed)
Called pt. She said she has had 3 colonoscopies previously. She is having a lot of diarrhea. OV on 07/11/2012 with Gerrit Halls, NP at 9:00 AM. ( Pt wanted it on that date).

## 2012-06-20 ENCOUNTER — Telehealth: Payer: Self-pay

## 2012-06-20 NOTE — Telephone Encounter (Signed)
She is calling for a refill on hydrocodone. She got #45 on Nov 1 (11 day supply) When can she get a refill?

## 2012-06-20 NOTE — Telephone Encounter (Signed)
There are refills already on the bottle, but like previous message she needs to make this last, She should have appt with Dr. Eduard Clos this week.

## 2012-06-24 ENCOUNTER — Other Ambulatory Visit: Payer: Self-pay

## 2012-06-24 ENCOUNTER — Telehealth: Payer: Self-pay | Admitting: Family Medicine

## 2012-06-24 MED ORDER — METFORMIN HCL 500 MG PO TABS: 500.0000 mg | ORAL_TABLET | Freq: Two times a day (BID) | ORAL | Status: DC

## 2012-06-24 MED ORDER — TRAMADOL HCL 50 MG PO TABS: 50.0000 mg | ORAL_TABLET | Freq: Three times a day (TID) | ORAL | Status: DC | PRN

## 2012-06-24 NOTE — Telephone Encounter (Signed)
Patient only needed metformin and tramadol sent in and decided to have them sent to [facility].

## 2012-06-24 NOTE — Telephone Encounter (Signed)
Stephanie Hammond spoke with patient

## 2012-07-09 ENCOUNTER — Encounter: Payer: Medicare Other | Admitting: Family Medicine

## 2012-07-11 ENCOUNTER — Ambulatory Visit: Payer: Medicare PPO | Admitting: Gastroenterology

## 2012-08-12 ENCOUNTER — Telehealth: Payer: Self-pay | Admitting: Family Medicine

## 2012-08-12 NOTE — Telephone Encounter (Signed)
Patient is supposed to call back and state where she wants her medications sent to through her new insurance

## 2012-08-15 ENCOUNTER — Telehealth: Payer: Self-pay

## 2012-08-15 ENCOUNTER — Other Ambulatory Visit: Payer: Self-pay

## 2012-08-15 ENCOUNTER — Other Ambulatory Visit: Payer: Self-pay | Admitting: Family Medicine

## 2012-08-15 MED ORDER — SIMVASTATIN 10 MG PO TABS: 10.0000 mg | ORAL_TABLET | Freq: Every evening | ORAL | Status: DC

## 2012-08-15 MED ORDER — GLUCOSE BLOOD VI STRP: ORAL_STRIP | Status: DC

## 2012-08-15 MED ORDER — METFORMIN HCL 500 MG PO TABS: 500.0000 mg | ORAL_TABLET | Freq: Two times a day (BID) | ORAL | Status: DC

## 2012-08-15 MED ORDER — OMEPRAZOLE 20 MG PO CPDR: DELAYED_RELEASE_CAPSULE | ORAL | Status: DC

## 2012-08-15 NOTE — Telephone Encounter (Signed)
Advise and send in maxzide 25mg  one daily , stop the zestoretic, may be allergic. Thirty day supply written historically, send to local pharmacy. She needs eval by Dr Jeanice Lim in approx 3 weeks on the new med. She needs to sched that appt. I will also fwd the message to PCP so she is aware of what is happening with the pt

## 2012-08-15 NOTE — Telephone Encounter (Signed)
Pt called and stated that her BP med has been causing her to "cough her head off" Stopped taking it as of yesterday. Advised Dr would not be in until Monday but I would see if there was an alternative that could be sent [facility] (on lisinopril HCTZ)

## 2012-08-15 NOTE — Telephone Encounter (Signed)
Request addressed

## 2012-08-16 ENCOUNTER — Other Ambulatory Visit: Payer: Self-pay

## 2012-08-16 MED ORDER — TRIAMTERENE-HCTZ 37.5-25 MG PO TABS: 1.0000 | ORAL_TABLET | Freq: Every day | ORAL | Status: DC

## 2012-08-19 ENCOUNTER — Telehealth: Payer: Self-pay | Admitting: Family Medicine

## 2012-08-19 NOTE — Telephone Encounter (Signed)
Pt has appt on 1/21 will review BP at that time and CPE

## 2012-08-19 NOTE — Telephone Encounter (Signed)
Sent to [facility]

## 2012-08-22 ENCOUNTER — Other Ambulatory Visit: Payer: Self-pay

## 2012-08-22 ENCOUNTER — Telehealth: Payer: Self-pay | Admitting: Family Medicine

## 2012-08-22 MED ORDER — METFORMIN HCL 500 MG PO TABS: 500.0000 mg | ORAL_TABLET | Freq: Two times a day (BID) | ORAL | Status: DC

## 2012-08-22 NOTE — Telephone Encounter (Signed)
Med sent.

## 2012-08-27 ENCOUNTER — Encounter: Payer: Medicare PPO | Admitting: Family Medicine

## 2012-08-29 ENCOUNTER — Telehealth: Payer: Self-pay | Admitting: Family Medicine

## 2012-08-30 MED ORDER — GLUCOSE BLOOD VI STRP: ORAL_STRIP | Status: DC

## 2012-08-30 NOTE — Telephone Encounter (Signed)
Resent in again with not that pt uses true result meter

## 2012-09-05 ENCOUNTER — Ambulatory Visit: Payer: Medicare PPO | Admitting: Family Medicine

## 2012-09-12 ENCOUNTER — Encounter: Payer: Self-pay | Admitting: Family Medicine

## 2012-09-12 ENCOUNTER — Ambulatory Visit (INDEPENDENT_AMBULATORY_CARE_PROVIDER_SITE_OTHER): Payer: Medicare Other | Admitting: Family Medicine

## 2012-09-12 VITALS — BP 124/78 | HR 98 | Resp 16 | Ht 63.0 in | Wt 216.0 lb

## 2012-09-12 DIAGNOSIS — M51369 Other intervertebral disc degeneration, lumbar region without mention of lumbar back pain or lower extremity pain: Secondary | ICD-10-CM

## 2012-09-12 DIAGNOSIS — F329 Major depressive disorder, single episode, unspecified: Secondary | ICD-10-CM

## 2012-09-12 DIAGNOSIS — I10 Essential (primary) hypertension: Secondary | ICD-10-CM

## 2012-09-12 DIAGNOSIS — I2510 Atherosclerotic heart disease of native coronary artery without angina pectoris: Secondary | ICD-10-CM

## 2012-09-12 DIAGNOSIS — E119 Type 2 diabetes mellitus without complications: Secondary | ICD-10-CM

## 2012-09-12 DIAGNOSIS — F17200 Nicotine dependence, unspecified, uncomplicated: Secondary | ICD-10-CM

## 2012-09-12 DIAGNOSIS — M5137 Other intervertebral disc degeneration, lumbosacral region: Secondary | ICD-10-CM

## 2012-09-12 DIAGNOSIS — E669 Obesity, unspecified: Secondary | ICD-10-CM

## 2012-09-12 DIAGNOSIS — M5136 Other intervertebral disc degeneration, lumbar region: Secondary | ICD-10-CM

## 2012-09-12 DIAGNOSIS — G8929 Other chronic pain: Secondary | ICD-10-CM

## 2012-09-12 DIAGNOSIS — M25569 Pain in unspecified knee: Secondary | ICD-10-CM

## 2012-09-12 DIAGNOSIS — Z720 Tobacco use: Secondary | ICD-10-CM

## 2012-09-12 DIAGNOSIS — R197 Diarrhea, unspecified: Secondary | ICD-10-CM

## 2012-09-12 DIAGNOSIS — M51379 Other intervertebral disc degeneration, lumbosacral region without mention of lumbar back pain or lower extremity pain: Secondary | ICD-10-CM

## 2012-09-12 MED ORDER — FLUOXETINE HCL 10 MG PO CAPS: 10.0000 mg | ORAL_CAPSULE | Freq: Every day | ORAL | Status: DC

## 2012-09-12 MED ORDER — OXYCODONE-ACETAMINOPHEN 5-325 MG PO TABS: 1.0000 | ORAL_TABLET | Freq: Four times a day (QID) | ORAL | Status: DC | PRN

## 2012-09-12 NOTE — Patient Instructions (Signed)
Get the labs done  Stop the metformin Start percocet  Start prozac for your mood  F/U 4 weeks for recheck

## 2012-09-13 ENCOUNTER — Encounter: Payer: Self-pay | Admitting: Family Medicine

## 2012-09-13 DIAGNOSIS — R197 Diarrhea, unspecified: Secondary | ICD-10-CM | POA: Insufficient documentation

## 2012-09-13 DIAGNOSIS — F329 Major depressive disorder, single episode, unspecified: Secondary | ICD-10-CM | POA: Insufficient documentation

## 2012-09-13 NOTE — Progress Notes (Signed)
Subjective:    Patient ID: Stephanie Hammond, female    DOB: 1950-10-27, 62 y.o.   MRN: [id number]  HPI  Patient here to follow chronic medical problems. She's been having pain in all of her joints including her lower back she is out of Vicodin she states it does not last very long in her system. Anti-inflammatories also did not help her She tells me today that she's been on a lot of stress and has been depressed for the past couple months. She was in jeopardy a forklift and on her house therefore she took a part-time job to try to help make ends meet she does not have support from her husband and she is too much prior to ask her children for any help other they have been using steam. She's not had much food at home but did not want to go to food [place]. She has lost 9 pounds mostly due to not eating very well. She is not sleeping as well and finds herself getting very upset.  She admits to diarrhea up to 4-5 times a day for the past couple months as well. She has not been checking her blood sugar on a regular basis but states the past couple weeks she has checked she had a high of 200. ABG in office 167 her last A1c was 5.5%   Review of Systems   GEN- denies fatigue, fever, weight loss,weakness, recent illness HEENT- denies eye drainage, change in vision, nasal discharge, CVS- denies chest pain, palpitations RESP- denies SOB, cough, wheeze ABD- denies N/V, +change in stools, abd pain GU- denies dysuria, hematuria, dribbling, incontinence MSK- +joint pain, muscle aches, injury Neuro- denies headache, dizziness, syncope, seizure activity      Objective:   Physical Exam GEN- NAD, alert and oriented x3 HEENT- PERRL, EOMI, non injected sclera, pink conjunctiva, MMM, oropharynx clear Neck- Supple,  CVS- RRR, no murmur RESP-CTAB ABD-NABS,soft,NT,ND EXT- No edema Pulses- Radial, DP- 2+ Psych-depressed affect, not anxious, no SI, no hallucinations       Assessment & Plan:

## 2012-09-13 NOTE — Assessment & Plan Note (Signed)
This current episode of depression has been from finances and he and told that she will be losing her home. She also has no support with her husband has been a long time discussing her options with her today discuss with her the food [employer] that are available as well as she's taken up the phone calling her son who congenitally help take care of her she was to find her way out of this situation. We will start her on Prozac 10 mg issue really wants something for her nerves I do not think that a benzodiazepine would be good especially with her narcotic use right now so I prefer to  stay away from this

## 2012-09-13 NOTE — Assessment & Plan Note (Signed)
She's not had her colonoscopy but I think that the diarrhea is due to the metformin I will discontinue this. Her exam is benign

## 2012-09-13 NOTE — Assessment & Plan Note (Signed)
She is on pain contract I will change her to Percocet #30 she will take one no more than 2 tablets a day

## 2012-09-13 NOTE — Assessment & Plan Note (Signed)
Unchanged and continues to smoke cigarettes

## 2012-09-13 NOTE — Assessment & Plan Note (Signed)
She has lost 9 pounds but due to depression and not having food to eat

## 2012-09-13 NOTE — Assessment & Plan Note (Signed)
She has been taking her medications on a regular basis even though she's had difficulty with her finances we'll continue her on statin drug as well as aspirin

## 2012-09-13 NOTE — Addendum Note (Signed)
Addended by: Milinda Antis F on: 09/13/2012 01:02 PM   Modules accepted: Orders

## 2012-09-13 NOTE — Assessment & Plan Note (Signed)
Her last A1c was 5.5% she is overcorrected I will stop the metformin we will obtain another A1c and she said she had a few random elevated blood sugars

## 2012-09-13 NOTE — Assessment & Plan Note (Signed)
Blood pressure is well-controlled no change the medication 

## 2012-09-13 NOTE — Assessment & Plan Note (Signed)
Unchanged per above

## 2012-09-23 ENCOUNTER — Other Ambulatory Visit: Payer: Self-pay | Admitting: Family Medicine

## 2012-09-26 ENCOUNTER — Other Ambulatory Visit: Payer: Self-pay | Admitting: Family Medicine

## 2012-09-26 MED ORDER — OXYCODONE-ACETAMINOPHEN 5-325 MG PO TABS: 1.0000 | ORAL_TABLET | Freq: Two times a day (BID) | ORAL | Status: DC | PRN

## 2012-09-26 NOTE — Telephone Encounter (Signed)
Spoke with pt about her pain meds, has been taking Twice a day,on pain contract Reiterated instructions, there was miscommunication

## 2012-10-18 LAB — BASIC METABOLIC PANEL
BUN: 27 mg/dL — ABNORMAL HIGH (ref 6–23)
Creat: 0.98 mg/dL (ref 0.50–1.10)

## 2012-10-18 LAB — CBC
Hemoglobin: 13.7 g/dL (ref 12.0–15.0)
Platelets: 349 10*3/uL (ref 150–400)
RBC: 4.4 MIL/uL (ref 3.87–5.11)
WBC: 5.6 10*3/uL (ref 4.0–10.5)

## 2012-10-18 LAB — HEMOGLOBIN A1C
Hgb A1c MFr Bld: 6.3 % — ABNORMAL HIGH (ref <5.7)
Mean Plasma Glucose: 134 mg/dL — ABNORMAL HIGH (ref <117)

## 2012-10-21 ENCOUNTER — Other Ambulatory Visit: Payer: Self-pay

## 2012-10-21 MED ORDER — OXYCODONE-ACETAMINOPHEN 5-325 MG PO TABS: 1.0000 | ORAL_TABLET | Freq: Two times a day (BID) | ORAL | Status: DC | PRN

## 2012-11-04 ENCOUNTER — Encounter: Payer: Medicare Other | Admitting: Family Medicine

## 2012-11-07 ENCOUNTER — Ambulatory Visit: Payer: Medicare Other | Admitting: Family Medicine

## 2012-11-14 ENCOUNTER — Encounter: Payer: Self-pay | Admitting: Family Medicine

## 2012-11-14 ENCOUNTER — Ambulatory Visit (HOSPITAL_COMMUNITY)
Admission: RE | Admit: 2012-11-14 | Discharge: 2012-11-14 | Disposition: A | Discharge status: Home / Self Care | Payer: Medicare Other | Source: Ambulatory Visit | Attending: Family Medicine | Admitting: Family Medicine

## 2012-11-14 ENCOUNTER — Ambulatory Visit (INDEPENDENT_AMBULATORY_CARE_PROVIDER_SITE_OTHER): Payer: Medicare Other | Admitting: Family Medicine

## 2012-11-14 VITALS — BP 140/72 | HR 101 | Resp 16 | Ht 63.0 in | Wt 218.4 lb

## 2012-11-14 DIAGNOSIS — E1142 Type 2 diabetes mellitus with diabetic polyneuropathy: Secondary | ICD-10-CM

## 2012-11-14 DIAGNOSIS — E1149 Type 2 diabetes mellitus with other diabetic neurological complication: Secondary | ICD-10-CM

## 2012-11-14 DIAGNOSIS — Z0000 Encounter for general adult medical examination without abnormal findings: Secondary | ICD-10-CM

## 2012-11-14 DIAGNOSIS — Z1239 Encounter for other screening for malignant neoplasm of breast: Secondary | ICD-10-CM

## 2012-11-14 DIAGNOSIS — Z720 Tobacco use: Secondary | ICD-10-CM

## 2012-11-14 DIAGNOSIS — Z1231 Encounter for screening mammogram for malignant neoplasm of breast: Secondary | ICD-10-CM | POA: Insufficient documentation

## 2012-11-14 DIAGNOSIS — E119 Type 2 diabetes mellitus without complications: Secondary | ICD-10-CM

## 2012-11-14 DIAGNOSIS — F17200 Nicotine dependence, unspecified, uncomplicated: Secondary | ICD-10-CM

## 2012-11-14 DIAGNOSIS — E1140 Type 2 diabetes mellitus with diabetic neuropathy, unspecified: Secondary | ICD-10-CM

## 2012-11-14 MED ORDER — GLIPIZIDE ER 5 MG PO TB24: 5.0000 mg | ORAL_TABLET | Freq: Every day | ORAL | Status: DC

## 2012-11-14 MED ORDER — GABAPENTIN 300 MG PO CAPS: 300.0000 mg | ORAL_CAPSULE | Freq: Three times a day (TID) | ORAL | Status: DC

## 2012-11-14 MED ORDER — OXYCODONE-ACETAMINOPHEN 5-325 MG PO TABS: 1.0000 | ORAL_TABLET | Freq: Two times a day (BID) | ORAL | Status: DC | PRN

## 2012-11-14 NOTE — Assessment & Plan Note (Signed)
Start gabapentin at bedtime.

## 2012-11-14 NOTE — Assessment & Plan Note (Signed)
Continue to encourage smoking cessation she's not ready to quit at this time

## 2012-11-14 NOTE — Patient Instructions (Addendum)
Stop metformin Continue other medications New life scan meter Check on Mail order Start gabapentin at bedtime F/U 3 months

## 2012-11-14 NOTE — Assessment & Plan Note (Addendum)
Her A1c shows pretty well-controlled. I would discontinue the metformin and put her on long-acting glipizide 5 mg. Urine micro

## 2012-11-14 NOTE — Progress Notes (Signed)
Subjective:    Patient ID: Stephanie Hammond, female    DOB: Oct 01, 1950, 62 y.o.   MRN: [id number]  HPI  Subjective:   Patient presents for Medicare Annual/Subsequent preventive examination.   Mammogram done this AM  Restart metformin because CBG were 300, diarrhea came back after starting. She also complains of worsening tingling and numbness in her feet and hands  Review Past Medical/Family/Social: Please see EMR No PAP Smear needed- hysterecomty    Risk Factors  Current exercise habits: walks some Dietary issues discussed: does not watch diet   Cardiac risk factors: Obesity (BMI >= 30 kg/m2).   Depression Screen  (Note: if answer to either of the following is "Yes", a more complete depression screening is indicated)  Over the past two weeks, have you felt down, depressed or hopeless? Yes Over the past two weeks, have you felt little interest or pleasure in doing things? No Have you lost interest or pleasure in daily life? No Do you often feel hopeless? No Do you cry easily over simple problems? No   Activities of Daily Living  In your present state of health, do you have any difficulty performing the following activities?:  Driving? No  Managing money? No  Feeding yourself? No  Getting from bed to chair? No  Climbing a flight of stairs? No  Preparing food and eating?: No  Bathing or showering? No  Getting dressed: No  Getting to the toilet? No  Using the toilet:No  Moving around from place to place: No  In the past year have you fallen or had a near fall?:No  Are you sexually active? No  Do you have more than one partner? No   Hearing Difficulties: No  Do you often ask people to speak up or repeat themselves? No  Do you experience ringing or noises in your ears? No Do you have difficulty understanding soft or whispered voices? No  Do you feel that you have a problem with memory? No Do you often misplace items? No  Do you feel safe at home? Yes  Cognitive  Testing  Alert? Yes Normal Appearance?Yes  Oriented to person? Yes Place? Yes  Time? Yes  Recall of three objects? Yes  Can perform simple calculations? Yes  Displays appropriate judgment?Yes  Can read the correct time from a watch face?Yes   List the Names of Other Physician/Practitioners you currently use: None    Indicate any recent Medical Services you may have received from other than [facility] providers in the past year (date may be approximate).   Screening Tests / Date Colonoscopy  - UTD         Mammogram - Done today Tetanus/tdap- insurance will not cover   Assessment:    Annual wellness medicare exam   Plan:    During the course of the visit the patient was educated and counseled about appropriate screening and preventive services including:  Screening mammography - today  Screen + for depression. From previous visits, on SSRI doing well, will keep at same dose. Diet review for nutrition referral? Yes ____ Not Indicated __x__  -- will work on with pt- finances and transportation issues Patient Instructions (the written plan) was given to the patient.  Medicare Attestation  I have personally reviewed:  The patient's medical and social history  Their use of alcohol, tobacco or illicit drugs  Their current medications and supplements  The patient's functional ability including ADLs,fall risks, home safety risks, cognitive, and hearing and visual impairment  Diet  and physical activities  The patient's weight, height, BMI, and visual acuity have been recorded in the chart. I have made referrals, counseling, and provided education to the patient based on review of the above and I have provided the patient with a written personalized care plan for preventive services.       Review of Systems  GEN- denies fatigue, fever, weight loss,weakness, recent illness HEENT- denies eye drainage, change in vision, nasal discharge, CVS- denies chest pain, palpitations RESP- denies  SOB, cough, wheeze ABD- denies N/V, change in stools, abd pain GU- denies dysuria, hematuria, dribbling, incontinence MSK- denies joint pain, muscle aches, injury Neuro- denies headache, dizziness, syncope, seizure activity      Objective:   Physical Exam GEN- NAD, alert and oriented x3 HEENT- PERRL, EOMI, non injected sclera, pink conjunctiva, MMM, oropharynx clear Neck- Supple, CVS- RRR, no murmur RESP-CTAB EXT- No edema Pulses- Radial, DP- 2+        Assessment & Plan:

## 2012-11-15 LAB — MICROALBUMIN / CREATININE URINE RATIO
Creatinine, Urine: 35.1 mg/dL
Microalb Creat Ratio: 14.2 mg/g (ref 0.0–30.0)

## 2012-11-21 ENCOUNTER — Telehealth: Payer: Self-pay | Admitting: Family Medicine

## 2012-11-25 ENCOUNTER — Other Ambulatory Visit: Payer: Self-pay

## 2012-11-25 MED ORDER — SIMVASTATIN 10 MG PO TABS: 10.0000 mg | ORAL_TABLET | Freq: Every evening | ORAL | Status: DC

## 2012-11-25 MED ORDER — OMEPRAZOLE 20 MG PO CPDR: DELAYED_RELEASE_CAPSULE | ORAL | Status: DC

## 2012-11-26 ENCOUNTER — Telehealth: Payer: Self-pay | Admitting: Family Medicine

## 2012-11-26 MED ORDER — SIMVASTATIN 10 MG PO TABS: 10.0000 mg | ORAL_TABLET | Freq: Every evening | ORAL | Status: DC

## 2012-11-26 MED ORDER — OMEPRAZOLE 20 MG PO CPDR: DELAYED_RELEASE_CAPSULE | ORAL | Status: DC

## 2012-11-26 NOTE — Telephone Encounter (Signed)
Refill sent in

## 2012-11-26 NOTE — Telephone Encounter (Signed)
Sent in

## 2012-11-28 ENCOUNTER — Other Ambulatory Visit: Payer: Self-pay

## 2012-12-03 ENCOUNTER — Telehealth: Payer: Self-pay | Admitting: Family Medicine

## 2012-12-03 NOTE — Telephone Encounter (Signed)
No I do not do any mail orders for this narcotic, she has to sign for it

## 2012-12-03 NOTE — Telephone Encounter (Signed)
Is this possible? I know she has to sign for it but would she be able to mail it off?

## 2012-12-03 NOTE — Telephone Encounter (Signed)
Patient aware.

## 2012-12-23 ENCOUNTER — Other Ambulatory Visit: Payer: Self-pay

## 2012-12-23 MED ORDER — OXYCODONE-ACETAMINOPHEN 5-325 MG PO TABS: 1.0000 | ORAL_TABLET | Freq: Two times a day (BID) | ORAL | Status: DC | PRN

## 2012-12-26 ENCOUNTER — Telehealth: Payer: Self-pay

## 2012-12-26 NOTE — Telephone Encounter (Signed)
Pt called to schedule colonoscopy. Was referred by Dr. Jeanice Lim in 06/2012. She is having problems with abdominal pain and diarrhea. Ov with Gerrit Halls, NP on 01/07/2013 at 2:00 PM.

## 2013-01-02 ENCOUNTER — Other Ambulatory Visit: Payer: Self-pay | Admitting: Family Medicine

## 2013-01-07 ENCOUNTER — Ambulatory Visit: Payer: Medicare Other | Admitting: Gastroenterology

## 2013-01-09 ENCOUNTER — Other Ambulatory Visit: Payer: Self-pay | Admitting: Gastroenterology

## 2013-01-09 ENCOUNTER — Ambulatory Visit (INDEPENDENT_AMBULATORY_CARE_PROVIDER_SITE_OTHER): Payer: Medicare Other | Admitting: Gastroenterology

## 2013-01-09 ENCOUNTER — Encounter: Payer: Self-pay | Admitting: Gastroenterology

## 2013-01-09 VITALS — BP 109/62 | HR 112 | Temp 97.4°F | Ht 63.0 in | Wt 218.8 lb

## 2013-01-09 DIAGNOSIS — K589 Irritable bowel syndrome without diarrhea: Secondary | ICD-10-CM | POA: Insufficient documentation

## 2013-01-09 MED ORDER — PEG 3350-KCL-NA BICARB-NACL 420 G PO SOLR: 4000.0000 mL | ORAL | Status: DC

## 2013-01-09 MED ORDER — DICYCLOMINE HCL 10 MG PO CAPS: 10.0000 mg | ORAL_CAPSULE | Freq: Three times a day (TID) | ORAL | Status: DC

## 2013-01-09 NOTE — Progress Notes (Signed)
Primary Care Physician:  Milinda Antis, MD Primary Gastroenterologist:  Dr. Darrick Penna   Chief Complaint  Patient presents with  . Colonoscopy  . Diarrhea    HPI:   62 year old female who presents today for a surveillance colonoscopy. She denies a family history of colon cancer. Her last colonoscopy was in 2006 by Dr. Marina Goodell. She reports lower abdominal discomfort, "hurts", has to run to bathroom after she eats. Notes loose stool after eating. Can't eat lettuce. Chronic. At least a year. Has hx of IBS. Has hx of IBS-D. Loose stools not new, but she notes abdominal pain is a new thing. She rocks in the bed at night. Was taking husband's Vicodin to ease the pain. Using back pain medication to ease the pain. No rectal bleeding. Reports weight loss. Gone from size 22-24 to size 18. Feels like stools are much more frequent. Coffee worsens. Per records, she has lost 5 lbs since Aug 2013.   No upper GI symptoms. On PPI.   Past Medical History  Diagnosis Date  . IBS (irritable bowel syndrome)   . GERD (gastroesophageal reflux disease)   . Anemia   . History of GI diverticular bleed 08/2004  . Diverticulitis   . Diverticulosis   . CAD (coronary artery disease)   . Osteoarthritis   . Diabetes mellitus   . Blood transfusion 2009    after GI bleed    Past Surgical History  Procedure Laterality Date  . Ptca    . Vaginal hysterectomy    . Cholecystectomy  2003  . Replacement total knee      right   . Colonoscopy  2000    Dr. Russella Dar: marked diverticulosis, difficult procedure due to adhesions, polyps benign  . Colonoscopy  2006    Dr. Marina Goodell: severe pandiverticulosis    Current Outpatient Prescriptions  Medication Sig Dispense Refill  . FLUoxetine (PROZAC) 10 MG capsule TAKE 1 CAPSULE (10 MG TOTAL)  BY MOUTH DAILY.  30 capsule  1  . gabapentin (NEURONTIN) 300 MG capsule Take 1 capsule (300 mg total) by mouth 3 (three) times daily.  90 capsule  1  . glipiZIDE (GLUCOTROL XL) 5 MG 24 hr  tablet Take 1 tablet (5 mg total) by mouth daily.  30 tablet  6  . omeprazole (PRILOSEC) 20 MG capsule One tab daily  30 capsule  2  . oxyCODONE-acetaminophen (PERCOCET) 5-325 MG per tablet Take 1 tablet by mouth 2 (two) times daily as needed for pain.  60 tablet  0  . simvastatin (ZOCOR) 10 MG tablet Take 1 tablet (10 mg total) by mouth every evening.  30 tablet  3  . triamterene-hydrochlorothiazide (MAXZIDE-25) 37.5-25 MG per tablet TAKE ONE TABLET BY MOUTH ONE TIME DAILY  30 tablet  3   No current facility-administered medications for this visit.    Allergies as of 01/09/2013 - Review Complete 01/09/2013  Allergen Reaction Noted  . Ace inhibitors Cough 08/15/2012  . Penicillins Itching and Swelling 03/28/2011    Family History  Problem Relation Age of Onset  . Colon polyps Sister   . Cancer Mother     lung   . Cancer Father     stomach   . Stomach cancer Father     questionable  . Colon cancer Neg Hx     History   Social History  . Marital Status: Married    Spouse Name: N/A    Number of Children: N/A  . Years of Education: N/A  Occupational History  . Not on file.   Social History Main Topics  . Smoking status: Current Every Day Smoker -- 1.00 packs/day  . Smokeless tobacco: Never Used  . Alcohol Use: No  . Drug Use: No  . Sexually Active: Not on file   Other Topics Concern  . Not on file   Social History Narrative  . No narrative on file    Review of Systems: Gen: SEE HPI CV: Denies chest pain, heart palpitations, peripheral edema, syncope.  Resp: Denies shortness of breath at rest or with exertion. Denies wheezing or cough.  GI: SEE HPI GU : Denies urinary burning, urinary frequency, urinary hesitancy MS: +joint pain, arthritis Derm: Denies rash, itching, dry skin Psych: Denies depression, anxiety, memory loss, and confusion Heme: Denies bruising, bleeding, and enlarged lymph nodes.  Physical Exam: BP 109/62  Pulse 112  Temp(Src) 97.4 F (36.3  C) (Oral)  Ht 5\' 3"  (1.6 m)  Wt 218 lb 12.8 oz (99.247 kg)  BMI 38.77 kg/m2 General:   Alert and oriented. Pleasant and cooperative. Well-nourished and well-developed.  Head:  Normocephalic and atraumatic. Eyes:  Without icterus, sclera clear and conjunctiva pink.  Ears:  Normal auditory acuity. Nose:  No deformity, discharge,  or lesions. Mouth:  No deformity or lesions, oral mucosa pink.  Neck:  Supple, without mass or thyromegaly. Lungs:  Clear to auscultation bilaterally. No wheezes, rales, or rhonchi. No distress.  Heart:  S1, S2 present without murmurs appreciated.  Abdomen:  +BS, soft, reports TTP lower abdomen with palpation but NO rebound or guarding, no peritoneal signs. Non-distended. Obese.   Rectal:  Deferred  Msk:  Symmetrical without gross deformities. Normal posture. Extremities:  Without clubbing or edema. Neurologic:  Alert and  oriented x4;  grossly normal neurologically. Skin:  Intact without significant lesions or rashes. Cervical Nodes:  No significant cervical adenopathy. Psych:  Alert and cooperative. Normal mood and affect.   Lab Results  Component Value Date   WBC 5.6 09/12/2012   HGB 13.7 09/12/2012   HCT 39.8 09/12/2012   MCV 90.5 09/12/2012   PLT 349 09/12/2012   Lab Results  Component Value Date   ALT 20 05/27/2012   AST 18 05/27/2012   ALKPHOS 115 05/27/2012   BILITOT 0.4 05/27/2012   Lab Results  Component Value Date   TSH 1.384 05/27/2012

## 2013-01-09 NOTE — Progress Notes (Signed)
CC PCP 

## 2013-01-09 NOTE — Patient Instructions (Addendum)
Please complete the stool sample as soon as possible and return to the lab.  I have sent a medication called "Bentyl" to your pharmacy. Take this with meals and at bedtime to help reduce stomach cramping, loose stools, urgency to go after you eat. Avoid dairy products and fatty foods.  We have set you up for a colonoscopy with Dr. Darrick Penna in the near future. Further recommendations to follow.

## 2013-01-09 NOTE — Assessment & Plan Note (Signed)
62 year old female presents today with a history of IBS-D per her report, noting an increase in loose stools and constant lower abdominal discomfort that is chronic. Notes postprandial urgency as well. No rectal bleeding noted. 5 lbs weight loss since Aug 2013. 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 No warning signs on physical exam today, and her abdominal exam is fairly benign although she notes diffuse abdominal tenderness with palpation. Likely dealing with IBS issues, unable to rule out infectious process. Labs to include CBC, HFP, and TSH unrevealing. As of note, abdominal pain unchanged by eating. Last TCS in 2006 with pancolonic diverticulosis, remote history of polyps in 2000.   Cdiff PCR, Stool culture, Giardia Proceed with colonoscopy with Dr. Darrick Penna in the near future. The risks, benefits, and alternatives have been discussed in detail with the patient. They state understanding and desire to proceed.  Bentyl qac and hs Consider CT abd/pelvis if no findings on TCS

## 2013-01-14 ENCOUNTER — Encounter (HOSPITAL_COMMUNITY): Payer: Self-pay | Admitting: Pharmacy Technician

## 2013-01-18 LAB — STOOL CULTURE

## 2013-01-20 NOTE — Progress Notes (Signed)
Quick Note:  Stool studies reviewed and are negative. Proceed with TCS as planned. Continue Bentyl as ordered. ______

## 2013-01-20 NOTE — Progress Notes (Signed)
Quick Note:  Called and informed pt. ______ 

## 2013-01-22 ENCOUNTER — Telehealth: Payer: Self-pay | Admitting: Family Medicine

## 2013-01-23 MED ORDER — OXYCODONE-ACETAMINOPHEN 5-325 MG PO TABS: 1.0000 | ORAL_TABLET | Freq: Two times a day (BID) | ORAL | Status: DC | PRN

## 2013-01-23 NOTE — Telephone Encounter (Signed)
Okay to refill, She will have to come to get

## 2013-01-23 NOTE — Telephone Encounter (Signed)
Med printed out and ready for pick up

## 2013-01-23 NOTE — Telephone Encounter (Signed)
OK to refill

## 2013-02-03 ENCOUNTER — Ambulatory Visit (HOSPITAL_COMMUNITY)
Admission: RE | Admit: 2013-02-03 | Discharge: 2013-02-03 | Disposition: A | Discharge status: Home / Self Care | Payer: Medicare Other | Source: Ambulatory Visit | Attending: Gastroenterology | Admitting: Gastroenterology

## 2013-02-03 ENCOUNTER — Encounter (HOSPITAL_COMMUNITY)
Admission: RE | Disposition: A | Discharge status: Home / Self Care | Payer: Self-pay | Source: Ambulatory Visit | Attending: Gastroenterology

## 2013-02-03 ENCOUNTER — Encounter (HOSPITAL_COMMUNITY): Payer: Self-pay | Admitting: *Deleted

## 2013-02-03 DIAGNOSIS — Z801 Family history of malignant neoplasm of trachea, bronchus and lung: Secondary | ICD-10-CM | POA: Insufficient documentation

## 2013-02-03 DIAGNOSIS — I2510 Atherosclerotic heart disease of native coronary artery without angina pectoris: Secondary | ICD-10-CM | POA: Insufficient documentation

## 2013-02-03 DIAGNOSIS — Z880 Allergy status to penicillin: Secondary | ICD-10-CM | POA: Insufficient documentation

## 2013-02-03 DIAGNOSIS — K648 Other hemorrhoids: Secondary | ICD-10-CM | POA: Insufficient documentation

## 2013-02-03 DIAGNOSIS — Z8371 Family history of colonic polyps: Secondary | ICD-10-CM | POA: Insufficient documentation

## 2013-02-03 DIAGNOSIS — K621 Rectal polyp: Secondary | ICD-10-CM

## 2013-02-03 DIAGNOSIS — Z888 Allergy status to other drugs, medicaments and biological substances status: Secondary | ICD-10-CM | POA: Insufficient documentation

## 2013-02-03 DIAGNOSIS — Z8719 Personal history of other diseases of the digestive system: Secondary | ICD-10-CM | POA: Insufficient documentation

## 2013-02-03 DIAGNOSIS — E119 Type 2 diabetes mellitus without complications: Secondary | ICD-10-CM | POA: Insufficient documentation

## 2013-02-03 DIAGNOSIS — K5730 Diverticulosis of large intestine without perforation or abscess without bleeding: Secondary | ICD-10-CM

## 2013-02-03 DIAGNOSIS — Z86010 Personal history of colonic polyps: Secondary | ICD-10-CM

## 2013-02-03 DIAGNOSIS — Z96659 Presence of unspecified artificial knee joint: Secondary | ICD-10-CM | POA: Insufficient documentation

## 2013-02-03 DIAGNOSIS — Z800 Family history of malignant neoplasm of digestive organs: Secondary | ICD-10-CM | POA: Insufficient documentation

## 2013-02-03 DIAGNOSIS — D126 Benign neoplasm of colon, unspecified: Secondary | ICD-10-CM

## 2013-02-03 DIAGNOSIS — Z79899 Other long term (current) drug therapy: Secondary | ICD-10-CM | POA: Insufficient documentation

## 2013-02-03 DIAGNOSIS — K620 Anal polyp: Secondary | ICD-10-CM

## 2013-02-03 DIAGNOSIS — K219 Gastro-esophageal reflux disease without esophagitis: Secondary | ICD-10-CM | POA: Insufficient documentation

## 2013-02-03 DIAGNOSIS — Z9861 Coronary angioplasty status: Secondary | ICD-10-CM | POA: Insufficient documentation

## 2013-02-03 DIAGNOSIS — D649 Anemia, unspecified: Secondary | ICD-10-CM | POA: Insufficient documentation

## 2013-02-03 DIAGNOSIS — Z83719 Family history of colon polyps, unspecified: Secondary | ICD-10-CM | POA: Insufficient documentation

## 2013-02-03 DIAGNOSIS — D129 Benign neoplasm of anus and anal canal: Secondary | ICD-10-CM | POA: Insufficient documentation

## 2013-02-03 DIAGNOSIS — R197 Diarrhea, unspecified: Secondary | ICD-10-CM | POA: Insufficient documentation

## 2013-02-03 DIAGNOSIS — M2420 Disorder of ligament, unspecified site: Secondary | ICD-10-CM | POA: Insufficient documentation

## 2013-02-03 DIAGNOSIS — M629 Disorder of muscle, unspecified: Secondary | ICD-10-CM | POA: Insufficient documentation

## 2013-02-03 DIAGNOSIS — M1990 Unspecified osteoarthritis, unspecified site: Secondary | ICD-10-CM | POA: Insufficient documentation

## 2013-02-03 DIAGNOSIS — F17200 Nicotine dependence, unspecified, uncomplicated: Secondary | ICD-10-CM | POA: Insufficient documentation

## 2013-02-03 DIAGNOSIS — D128 Benign neoplasm of rectum: Secondary | ICD-10-CM | POA: Insufficient documentation

## 2013-02-03 DIAGNOSIS — R634 Abnormal weight loss: Secondary | ICD-10-CM | POA: Insufficient documentation

## 2013-02-03 DIAGNOSIS — K589 Irritable bowel syndrome without diarrhea: Secondary | ICD-10-CM | POA: Insufficient documentation

## 2013-02-03 HISTORY — PX: COLONOSCOPY: SHX5424

## 2013-02-03 SURGERY — COLONOSCOPY
Anesthesia: Moderate Sedation

## 2013-02-03 MED ORDER — MEPERIDINE HCL 100 MG/ML IJ SOLN
INTRAMUSCULAR | Status: DC | PRN
  Administered 2013-02-03: 50 mg via INTRAVENOUS
  Administered 2013-02-03: 25 mg via INTRAVENOUS

## 2013-02-03 MED ORDER — SODIUM CHLORIDE 0.9 % IV SOLN
INTRAVENOUS | Status: DC
  Administered 2013-02-03: 09:00:00 via INTRAVENOUS

## 2013-02-03 MED ORDER — MEPERIDINE HCL 100 MG/ML IJ SOLN
INTRAMUSCULAR | Status: AC
  Filled 2013-02-03: qty 1

## 2013-02-03 MED ORDER — MIDAZOLAM HCL 5 MG/5ML IJ SOLN
INTRAMUSCULAR | Status: DC | PRN
  Administered 2013-02-03: 1 mg via INTRAVENOUS
  Administered 2013-02-03: 2 mg via INTRAVENOUS
  Administered 2013-02-03: 1 mg via INTRAVENOUS
  Administered 2013-02-03: 2 mg via INTRAVENOUS

## 2013-02-03 MED ORDER — MIDAZOLAM HCL 5 MG/5ML IJ SOLN
INTRAMUSCULAR | Status: AC
  Filled 2013-02-03: qty 10

## 2013-02-03 MED ORDER — STERILE WATER FOR IRRIGATION IR SOLN
Status: DC | PRN
  Administered 2013-02-03: 10:00:00

## 2013-02-03 NOTE — H&P (Signed)
Primary Care Physician:  Milinda Antis, MD Primary Gastroenterologist:  Dr. Darrick Penna  Pre-Procedure History & Physical: HPI:  Stephanie Hammond is a 62 y.o. female here for DIARRHEA/WEIGHT LOSS.   Past Medical History  Diagnosis Date  . IBS (irritable bowel syndrome)   . GERD (gastroesophageal reflux disease)   . Anemia   . History of GI diverticular bleed 08/2004  . Diverticulitis   . Diverticulosis   . CAD (coronary artery disease)   . Osteoarthritis   . Diabetes mellitus   . Blood transfusion 2009    after GI bleed    Past Surgical History  Procedure Laterality Date  . Ptca    . Vaginal hysterectomy    . Cholecystectomy  2003  . Replacement total knee      right   . Colonoscopy  2000    Dr. Russella Dar: marked diverticulosis, difficult procedure due to adhesions, polyps benign  . Colonoscopy  2006    Dr. Marina Goodell: severe pandiverticulosis    Prior to Admission medications   Medication Sig Start Date End Date Taking? Authorizing Provider  dicyclomine (BENTYL) 10 MG capsule Take 1 capsule (10 mg total) by mouth 4 (four) times daily -  before meals and at bedtime. 01/09/13  Yes Nira Retort, NP  FLUoxetine (PROZAC) 10 MG capsule TAKE 1 CAPSULE (10 MG TOTAL)  BY MOUTH DAILY. 01/02/13  Yes Salley Scarlet, MD  gabapentin (NEURONTIN) 300 MG capsule Take 300 mg by mouth 3 (three) times daily.   Yes Historical Provider, MD  glipiZIDE (GLUCOTROL XL) 5 MG 24 hr tablet Take 1 tablet (5 mg total) by mouth daily. 11/14/12  Yes Salley Scarlet, MD  metFORMIN (GLUCOPHAGE) 500 MG tablet Take 500 mg by mouth 2 (two) times daily.   Yes Historical Provider, MD  omeprazole (PRILOSEC) 20 MG capsule One tab daily 11/26/12  Yes Salley Scarlet, MD  oxyCODONE-acetaminophen (PERCOCET) 5-325 MG per tablet Take 1 tablet by mouth 2 (two) times daily as needed for pain. 01/23/13  Yes Salley Scarlet, MD  simvastatin (ZOCOR) 10 MG tablet Take 1 tablet (10 mg total) by mouth every evening. 11/26/12 11/26/13 Yes  Salley Scarlet, MD  triamterene-hydrochlorothiazide [facility]) 37.5-25 MG per tablet TAKE ONE TABLET BY MOUTH ONE TIME DAILY 09/23/12  Yes Salley Scarlet, MD    Allergies as of 01/09/2013 - Review Complete 01/09/2013  Allergen Reaction Noted  . Ace inhibitors Cough 08/15/2012  . Penicillins Itching and Swelling 03/28/2011    Family History  Problem Relation Age of Onset  . Colon polyps Sister   . Cancer Mother     lung   . Cancer Father     stomach   . Stomach cancer Father     questionable  . Colon cancer Neg Hx     History   Social History  . Marital Status: Married    Spouse Name: N/A    Number of Children: N/A  . Years of Education: N/A   Occupational History  . Not on file.   Social History Main Topics  . Smoking status: Current Every Day Smoker -- 1.00 packs/day for 35 years  . Smokeless tobacco: Never Used  . Alcohol Use: No  . Drug Use: No  . Sexually Active: Not on file   Other Topics Concern  . Not on file   Social History Narrative  . No narrative on file    Review of Systems: See HPI, otherwise negative ROS   Physical Exam:  BP 161/76  Pulse 84  Temp(Src) 98.2 F (36.8 C) (Oral)  Resp 19  Ht 5\' 3"  (1.6 m)  Wt 218 lb (98.884 kg)  BMI 38.63 kg/m2  SpO2 96% General:   Alert,  pleasant and cooperative in NAD Head:  Normocephalic and atraumatic. Neck:  Supple; Lungs:  Clear throughout to auscultation.    Heart:  Regular rate and rhythm. Abdomen:  Soft, nontender and nondistended. Normal bowel sounds, without guarding, and without rebound.   Neurologic:  Alert and  oriented x4;  grossly normal neurologically.  Impression/Plan:     Diarrhea/WEIGHT LOSS  PLAN: TCS today

## 2013-02-04 ENCOUNTER — Encounter (HOSPITAL_COMMUNITY): Payer: Self-pay | Admitting: Gastroenterology

## 2013-02-04 NOTE — Op Note (Addendum)
[facility] [address] [place], [address]   COLONOSCOPY PROCEDURE REPORT  PATIENT: Stephanie, Hammond  MR#: [id number] BIRTHDATE: [birth year] , 61  yrs. old GENDER: Female ENDOSCOPIST: Jonette Eva, MD REFERRED XB:[id number] [place], M.D. PROCEDURE DATE:  02/03/2013 PROCEDURE:   Colonoscopy with snare/Cold biopsy polypectomy  AND RANDOM COLD FORCEPS BIOPSY INDICATIONS:unexplained diarrhea and Weight loss.  PERSONAL HX POLYPS(TCS 2000, 2006)BENTYL CAUSED BLURRY VISION. MEDICATIONS: Demerol 75 mg IV and Versed 6 mg IV  DESCRIPTION OF PROCEDURE:    Physical exam was performed.  Informed consent was obtained from the patient after explaining the benefits, risks, and alternatives to procedure.  The patient was connected to monitor and placed in left lateral position. Continuous oxygen was provided by nasal cannula and IV medicine administered through an indwelling cannula.  After administration of sedation and rectal exam, the patients rectum was intubated and the EC-[id number] ([id number])  colonoscope was advanced under direct visualization to the cecum.  The scope was removed slowly by carefully examining the color, texture, anatomy, and integrity mucosa on the way out.  The patient was recovered in endoscopy and discharged home in satisfactory condition.    COLON FINDINGS: Two sessile polyps ranging between 3-34mm in size were found at the cecum and in the transverse colon.  A polypectomy was performed with cold forceps and using snare cautery.  , Two sessile polyps ranging between 3-66mm in size were found in the rectum.  A polypectomy was performed with cold forceps and using snare cautery.  , A normal appearing cecum, ileocecal valve, and appendiceal orifice were identified.  The ascending, hepatic flexure, transverse, splenic flexure, descending, sigmoid colon and rectum appeared unremarkable.  No polyps or cancers were seen. Multiple biopsies were performed.  ,  There was moderate diverticulosis noted throughout the entire examined colon with associated muscular hypertrophy and luminal narrowing.  , and Small internal hemorrhoids were found.  PREP QUALITY: good.   CECAL W/D TIME: 21 minutes  COMPLICATIONS:  PT AGITATED DURING TCS WITH NO RECALL IN POST-OP.  ENDOSCOPIC IMPRESSION: 1.   4 COLORECTAL POLYPS REMOVED 2.   Moderate diverticulosis throughout the entire examined colon 3.   Small internal hemorrhoids  RECOMMENDATIONS: TAKE IMODIUM 30 MINS PRIOR TO MEALS THREE TIMES A DAY. CHEW 2 TUMS WITH MEALS 3 TIMES A DAY.  NO MORE THAN 6 TUMS A DAY. FOLLOW A HIGH FIBER/LOW FAT DIET.  AVOID ITEMS THAT CAUSE BLOATING.  BIOPSY RESULTS SHOULD BE BACK IN 7 DAYS. Next colonoscopy/?OVERTUBE in 5 years.       _______________________________ Rosalie DoctorJonette Eva, MD 02/04/2013 9:09 AM Revised: 02/04/2013 9:09 AM    PATIENT NAME:  Stephanie Hammond MR#: [id number]

## 2013-02-09 ENCOUNTER — Emergency Department (HOSPITAL_COMMUNITY)
Admission: EM | Admit: 2013-02-09 | Discharge: 2013-02-09 | Disposition: A | Discharge status: Home / Self Care | Payer: Medicare Other | Attending: Emergency Medicine | Admitting: Emergency Medicine

## 2013-02-09 ENCOUNTER — Encounter (HOSPITAL_COMMUNITY): Payer: Self-pay | Admitting: Emergency Medicine

## 2013-02-09 DIAGNOSIS — Z8719 Personal history of other diseases of the digestive system: Secondary | ICD-10-CM | POA: Insufficient documentation

## 2013-02-09 DIAGNOSIS — F17200 Nicotine dependence, unspecified, uncomplicated: Secondary | ICD-10-CM | POA: Insufficient documentation

## 2013-02-09 DIAGNOSIS — Z880 Allergy status to penicillin: Secondary | ICD-10-CM | POA: Insufficient documentation

## 2013-02-09 DIAGNOSIS — Z79899 Other long term (current) drug therapy: Secondary | ICD-10-CM | POA: Insufficient documentation

## 2013-02-09 DIAGNOSIS — M1990 Unspecified osteoarthritis, unspecified site: Secondary | ICD-10-CM | POA: Insufficient documentation

## 2013-02-09 DIAGNOSIS — Z862 Personal history of diseases of the blood and blood-forming organs and certain disorders involving the immune mechanism: Secondary | ICD-10-CM | POA: Insufficient documentation

## 2013-02-09 DIAGNOSIS — E119 Type 2 diabetes mellitus without complications: Secondary | ICD-10-CM | POA: Insufficient documentation

## 2013-02-09 DIAGNOSIS — M109 Gout, unspecified: Secondary | ICD-10-CM | POA: Insufficient documentation

## 2013-02-09 DIAGNOSIS — Z96659 Presence of unspecified artificial knee joint: Secondary | ICD-10-CM | POA: Insufficient documentation

## 2013-02-09 DIAGNOSIS — K219 Gastro-esophageal reflux disease without esophagitis: Secondary | ICD-10-CM | POA: Insufficient documentation

## 2013-02-09 DIAGNOSIS — I2510 Atherosclerotic heart disease of native coronary artery without angina pectoris: Secondary | ICD-10-CM | POA: Insufficient documentation

## 2013-02-09 LAB — GLUCOSE, CAPILLARY: Glucose-Capillary: 103 mg/dL — ABNORMAL HIGH (ref 70–99)

## 2013-02-09 MED ORDER — DEXAMETHASONE SODIUM PHOSPHATE 10 MG/ML IJ SOLN
10.0000 mg | Freq: Once | INTRAMUSCULAR | Status: AC
  Administered 2013-02-09: 10 mg via INTRAMUSCULAR
  Filled 2013-02-09: qty 1

## 2013-02-09 MED ORDER — ONDANSETRON HCL 8 MG PO TABS: 8.0000 mg | ORAL_TABLET | Freq: Three times a day (TID) | ORAL | Status: DC | PRN

## 2013-02-09 MED ORDER — OXYCODONE-ACETAMINOPHEN 5-325 MG PO TABS
2.0000 | ORAL_TABLET | Freq: Once | ORAL | Status: AC
  Administered 2013-02-09: 2 via ORAL
  Filled 2013-02-09: qty 2

## 2013-02-09 MED ORDER — PREDNISONE 10 MG PO TABS: ORAL_TABLET | ORAL | Status: DC

## 2013-02-09 NOTE — ED Notes (Signed)
Pt started having pain to left ankle then stopped. lpt now c/o r ankle pain and swelling x 3 days now. Denies injury. Pain to touch and movement. Nad. Denies sob. Hx of gout

## 2013-02-10 NOTE — ED Provider Notes (Signed)
History    CSN: [id number] Arrival date & time 02/09/13  1303  First MD Initiated Contact with Patient 02/09/13 1359     Chief Complaint  Patient presents with  . Ankle Pain   (Consider location/radiation/quality/duration/timing/severity/associated sxs/prior Treatment) HPI Comments: Stephanie Hammond is a 62 y.o. Female presenting with pain and swelling in her right ankle which reminds her of previous gouty flares.  She reports having pain in her left ankle which has resolved,  But flared in her right ankle 3 days ago.  The ankle is tender to touch and movement and weight bearing.  She denies injury, including no fall or trauma,  No puncture wounds or other skin injuries.  She does have a history of diabetes which is very well controlled.  She has been elevating her foot as much as possible and has used ibuprofen which has not relieved her pain.  She states that steroid shots in the past have helped greatly.  She denies fevers, chills, nausea or vomiting and has no other complaints.  Pain is deep, throbbing and does not radiate.     The history is provided by the patient.   Past Medical History  Diagnosis Date  . IBS (irritable bowel syndrome)   . GERD (gastroesophageal reflux disease)   . Anemia   . History of GI diverticular bleed 08/2004  . Diverticulitis   . Diverticulosis   . CAD (coronary artery disease)   . Osteoarthritis   . Diabetes mellitus   . Blood transfusion 2009    after GI bleed   Past Surgical History  Procedure Laterality Date  . Ptca    . Vaginal hysterectomy    . Cholecystectomy  2003  . Replacement total knee      right   . Colonoscopy  2000    Dr. Russella Dar: marked diverticulosis, difficult procedure due to adhesions, polyps benign  . Colonoscopy  2006    Dr. Marina Goodell: severe pandiverticulosis  . Colonoscopy N/A 02/03/2013    Procedure: COLONOSCOPY;  Surgeon: West Bali, MD;  Location: [facility];  Service: Endoscopy;  Laterality: N/A;  9:30    Family History  Problem Relation Age of Onset  . Colon polyps Sister   . Cancer Mother     lung   . Cancer Father     stomach   . Stomach cancer Father     questionable  . Colon cancer Neg Hx    History  Substance Use Topics  . Smoking status: Current Every Day Smoker -- 1.00 packs/day for 35 years  . Smokeless tobacco: Never Used  . Alcohol Use: No   OB History   Grav Para Term Preterm Abortions TAB SAB Ect Mult Living                 Review of Systems  Musculoskeletal: Positive for joint swelling and arthralgias.  Skin: Negative for wound.  Neurological: Negative for weakness and numbness.    Allergies  Ace inhibitors; Bentyl; and Penicillins  Home Medications   Current Outpatient Rx  Name  Route  Sig  Dispense  Refill  . FLUoxetine (PROZAC) 10 MG capsule   Oral   Take 10 mg by mouth every morning.         . gabapentin (NEURONTIN) 300 MG capsule   Oral   Take 300 mg by mouth every morning.          [name] glipiZIDE (GLUCOTROL XL) 5 MG 24 hr tablet   Oral  Take 5 mg by mouth every morning.         [name] ibuprofen (ADVIL,MOTRIN) 200 MG tablet   Oral   Take 400 mg by mouth every 6 (six) hours as needed for pain.         . metFORMIN (GLUCOPHAGE) 500 MG tablet   Oral   Take 500 mg by mouth daily as needed (for high blood glucose levels).          [name] omeprazole (PRILOSEC) 20 MG capsule   Oral   Take 20 mg by mouth every morning.         [name] oxyCODONE-acetaminophen (PERCOCET) 5-325 MG per tablet   Oral   Take 1 tablet by mouth 2 (two) times daily as needed for pain.   60 tablet   0   . simvastatin (ZOCOR) 10 MG tablet   Oral   Take 10 mg by mouth every morning.         . triamterene-hydrochlorothiazide (MAXZIDE-25) 37.5-25 MG per tablet   Oral   Take 1 tablet by mouth every morning.         . ondansetron (ZOFRAN) 8 MG tablet   Oral   Take 1 tablet (8 mg total) by mouth every 8 (eight) hours as needed for nausea.   12 tablet   0   .  predniSONE (DELTASONE) 10 MG tablet      5, 4, 3, 2 then 1 tablet by mouth daily for 5 days total.   15 tablet   0    BP 145/82  Pulse 98  Temp(Src) 98 F (36.7 C) (Oral)  Resp 22  Ht 5\' 3"  (1.6 m)  Wt 218 lb (98.884 kg)  BMI 38.63 kg/m2  SpO2 98% Physical Exam  Constitutional: She appears well-developed and well-nourished.  HENT:  Head: Atraumatic.  Neck: Normal range of motion.  Cardiovascular:  Pulses equal bilaterally  Musculoskeletal: She exhibits edema and tenderness.       Right foot: She exhibits tenderness and swelling. She exhibits normal capillary refill and no deformity.  Edema,  Slight erythema, ttp bilateral right ankle, including anterior ankle and proximal foot.  Tender with light touch.  Slight increased warmth.  Distal sensation intact,  Less than 3 sec cap refill.  Pt displays FROM of the ankle joint, but with discomfort.  No skin injury, punctures, abrasions, etc.  Neurological: She is alert. She has normal strength. She displays normal reflexes. No sensory deficit.  Equal strength  Skin: Skin is warm and dry.  Psychiatric: She has a normal mood and affect.    ED Course  Procedures (including critical care time) Labs Reviewed  GLUCOSE, CAPILLARY - Abnormal; Notable for the following:    Glucose-Capillary 103 (*)    All other components within normal limits   No results found. 1. Gout attack     MDM  Pt was given decadron 10 mg injection, oxycodone.  Prescribed prednisone 5 day taper starting tomorrow.  Encouraged warm compresses,  Elevation.  Recheck by pcp if not improving over the next few days.  She does check her cbg's daily,  Asked to continue this.  Advised that prednisone can elevate blood glucose,  Pt aware.  Exam most c/w gout, with pt who has a h/o same.  Doubt septic joint, cellulitis.  Burgess Amor, PA-C 02/10/13 [phone]

## 2013-02-10 NOTE — ED Provider Notes (Signed)
Medical screening examination/treatment/procedure(s) were performed by non-physician practitioner and as supervising physician I was immediately available for consultation/collaboration.  Arley Garant, MD 02/10/13 0819 

## 2013-02-12 ENCOUNTER — Telehealth: Payer: Self-pay | Admitting: Gastroenterology

## 2013-02-12 NOTE — Telephone Encounter (Signed)
Please call pt. She had simple adenomas removed from her colon.   TAKE IMODIUM 30 MINS PRIOR TO MEALS THREE TIMES A DAY. CHEW 2 TUMS WITH MEALS 3 TIMES A DAY. NO MORE THAN 6 TUMS A DAY.  FOLLOW A HIGH FIBER/LOW FAT DIET. AVOID ITEMS THAT CAUSE BLOATING. SEE INFO BELOW.  OPV IN AUG 2014 E30 DIARRHE/ABDOMINAL PAIN  Next colonoscopy in 5 years.

## 2013-02-13 ENCOUNTER — Other Ambulatory Visit: Payer: Self-pay

## 2013-02-13 NOTE — Telephone Encounter (Signed)
Cc PCP 

## 2013-02-13 NOTE — Telephone Encounter (Signed)
Called and informed pt.  

## 2013-02-18 NOTE — Telephone Encounter (Signed)
Pt is aware of OV on 8/13 at 1030 with SF and reminder in epic for 5 yr tcs

## 2013-02-20 ENCOUNTER — Telehealth: Payer: Self-pay | Admitting: Family Medicine

## 2013-02-20 NOTE — Telephone Encounter (Signed)
?  ok to refill °

## 2013-02-21 MED ORDER — OXYCODONE-ACETAMINOPHEN 5-325 MG PO TABS: 1.0000 | ORAL_TABLET | Freq: Two times a day (BID) | ORAL | Status: DC | PRN

## 2013-02-21 NOTE — Telephone Encounter (Signed)
Med refilled, signed and pt aware

## 2013-02-21 NOTE — Telephone Encounter (Signed)
Okay to fill? 

## 2013-03-03 ENCOUNTER — Ambulatory Visit: Payer: Self-pay | Admitting: Family Medicine

## 2013-03-07 ENCOUNTER — Ambulatory Visit: Payer: Self-pay | Admitting: Family Medicine

## 2013-03-10 ENCOUNTER — Telehealth: Payer: Self-pay | Admitting: Family Medicine

## 2013-03-10 MED ORDER — GABAPENTIN 300 MG PO CAPS: 300.0000 mg | ORAL_CAPSULE | Freq: Every morning | ORAL | Status: DC

## 2013-03-10 NOTE — Telephone Encounter (Signed)
Rx Refilled  

## 2013-03-12 ENCOUNTER — Ambulatory Visit (INDEPENDENT_AMBULATORY_CARE_PROVIDER_SITE_OTHER): Payer: Medicare Other | Admitting: Family Medicine

## 2013-03-12 ENCOUNTER — Encounter: Payer: Self-pay | Admitting: Family Medicine

## 2013-03-12 VITALS — BP 126/78 | HR 108 | Temp 98.4°F | Resp 18 | Wt 227.0 lb

## 2013-03-12 DIAGNOSIS — E669 Obesity, unspecified: Secondary | ICD-10-CM

## 2013-03-12 DIAGNOSIS — E1149 Type 2 diabetes mellitus with other diabetic neurological complication: Secondary | ICD-10-CM

## 2013-03-12 DIAGNOSIS — I10 Essential (primary) hypertension: Secondary | ICD-10-CM

## 2013-03-12 DIAGNOSIS — E119 Type 2 diabetes mellitus without complications: Secondary | ICD-10-CM

## 2013-03-12 DIAGNOSIS — E1140 Type 2 diabetes mellitus with diabetic neuropathy, unspecified: Secondary | ICD-10-CM

## 2013-03-12 DIAGNOSIS — E1142 Type 2 diabetes mellitus with diabetic polyneuropathy: Secondary | ICD-10-CM

## 2013-03-12 DIAGNOSIS — J3489 Other specified disorders of nose and nasal sinuses: Secondary | ICD-10-CM | POA: Insufficient documentation

## 2013-03-12 DIAGNOSIS — E785 Hyperlipidemia, unspecified: Secondary | ICD-10-CM | POA: Insufficient documentation

## 2013-03-12 DIAGNOSIS — M5136 Other intervertebral disc degeneration, lumbar region: Secondary | ICD-10-CM

## 2013-03-12 DIAGNOSIS — M5137 Other intervertebral disc degeneration, lumbosacral region: Secondary | ICD-10-CM

## 2013-03-12 LAB — HEMOGLOBIN A1C, FINGERSTICK: Hgb A1C (fingerstick): 5.8 % — ABNORMAL HIGH (ref <5.7)

## 2013-03-12 MED ORDER — GABAPENTIN 300 MG PO CAPS: ORAL_CAPSULE | ORAL | Status: DC

## 2013-03-12 MED ORDER — MUPIROCIN 2 % EX OINT: TOPICAL_OINTMENT | Freq: Two times a day (BID) | CUTANEOUS | Status: DC

## 2013-03-12 MED ORDER — OXYCODONE-ACETAMINOPHEN 5-325 MG PO TABS: 1.0000 | ORAL_TABLET | Freq: Two times a day (BID) | ORAL | Status: DC | PRN

## 2013-03-12 MED ORDER — TRIAMTERENE-HCTZ 37.5-25 MG PO TABS: 1.0000 | ORAL_TABLET | Freq: Every morning | ORAL | Status: DC

## 2013-03-12 NOTE — Assessment & Plan Note (Addendum)
A1c today looks very good at 5.8%. Unfortunately she does not watch her diet very well therefore her sugars will fluctuate on her. I will continue her at that glipizide 5 mg daily. We'll recheck another A1c at her next visit. If this is still down we will discontinue the glipizide altogether or she coughs with any hypoglycemic symptoms Advised to schedule eye exam

## 2013-03-12 NOTE — Assessment & Plan Note (Signed)
Fasting labs to be done at the lab in [place]

## 2013-03-12 NOTE — Progress Notes (Signed)
Subjective:    Patient ID: Stephanie Hammond, female    DOB: Jul 16, 1951, 62 y.o.   MRN: [id number]  HPI  Patient here to follow chronic medical problems. She continues to have low back pain and often will have pain in both hips. 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 She denies any falls. Denies any radiation of pain down her legs. She does have tingling and numbness in her feet which she is taking gabapentin for for diabetic neuropathy. Her Percocet controls her back pain. Diabetes mellitus her blood sugars have been in the low 100s. She's not had any hypoglycemia. Pending the white sheet her sugars can go up into the 200s this morning she was up to 200 she states she had soda and the mother sugary things last night for dinner. She is a spot on her nose which is been present for the past couple months. She denies any pain or tenderness. She denies any bleeding Review of Systems  GEN- denies fatigue, fever, weight loss,weakness, recent illness HEENT- denies eye drainage, change in vision, nasal discharge, CVS- denies chest pain, palpitations RESP- denies SOB, cough, wheeze ABD- denies N/V, change in stools, abd pain GU- denies dysuria, hematuria, dribbling, incontinence MSK- + joint pain, muscle aches, injury Neuro- denies headache, dizziness, syncope, seizure activity       Objective:   Physical Exam GEN- NAD, alert and oriented x3 HEENT- PERRL, EOMI, non injected sclera, pink conjunctiva, MMM, oropharynx clear Neck- Supple,  CVS- RRR, no murmur RESP-CTAB MSK- TTP lumbar spine and paraspinals, neg SLR, good ROM bilateral Hips  Pain with flexion of back EXT- No edema, Feet- no open lesions Pulses- Radial, DP- 2+ Skin- philtrum and bilateral nares, erythema noted with mild friability of skin where nares connects to philtrum on right side, no pustules noted.        Assessment & Plan:

## 2013-03-12 NOTE — Patient Instructions (Addendum)
Gabapentin increased to 2 tablets at bedtime and 1 tablet in AM and in afternoon Use the bactroban  Get shingles vaccine from the pharmacy Get the labs done fasting on Friday F/U 4 months

## 2013-03-12 NOTE — Assessment & Plan Note (Signed)
Appears to be a skin infection. I will go ahead start her on Bactroban twice a day. Other possibility is underlying fungal infection. We will see how she does with the Bactroban first

## 2013-03-12 NOTE — Assessment & Plan Note (Signed)
Increase gabapentin to 600 mg each bedtime 300 mg twice a day

## 2013-03-12 NOTE — Assessment & Plan Note (Signed)
Her weight is playing a significant part of her back pain and knee pain. She has not been diligent about exercising, she has gained 10 pounds since her last visit

## 2013-03-12 NOTE — Assessment & Plan Note (Signed)
Continued pain. She does not want any further intervention. I will increase her gabapentin at bedtime and her Percocet was refilled

## 2013-03-12 NOTE — Assessment & Plan Note (Signed)
Well controlled 

## 2013-03-18 ENCOUNTER — Encounter: Payer: Self-pay | Admitting: Gastroenterology

## 2013-03-19 ENCOUNTER — Ambulatory Visit: Payer: Medicare Other | Admitting: Gastroenterology

## 2013-04-09 ENCOUNTER — Other Ambulatory Visit: Payer: Self-pay | Admitting: Family Medicine

## 2013-04-09 NOTE — Telephone Encounter (Signed)
Med(s) refilled per Dr. Jeanice Lim

## 2013-04-22 ENCOUNTER — Ambulatory Visit (INDEPENDENT_AMBULATORY_CARE_PROVIDER_SITE_OTHER): Payer: Medicare Other | Admitting: Family Medicine

## 2013-04-22 VITALS — BP 122/80 | HR 90 | Temp 97.5°F | Resp 18 | Ht 63.0 in | Wt 228.0 lb

## 2013-04-22 DIAGNOSIS — E119 Type 2 diabetes mellitus without complications: Secondary | ICD-10-CM

## 2013-04-22 LAB — URINALYSIS, ROUTINE W REFLEX MICROSCOPIC
Bilirubin Urine: NEGATIVE
Glucose, UA: NEGATIVE mg/dL
Hgb urine dipstick: NEGATIVE
Leukocytes, UA: NEGATIVE
Protein, ur: NEGATIVE mg/dL
Urobilinogen, UA: 0.2 mg/dL (ref 0.0–1.0)

## 2013-04-22 LAB — CBC
HCT: 36.3 % (ref 36.0–46.0)
Hemoglobin: 12.7 g/dL (ref 12.0–15.0)
MCH: 31.1 pg (ref 26.0–34.0)
MCHC: 35 g/dL (ref 30.0–36.0)

## 2013-04-22 LAB — COMPREHENSIVE METABOLIC PANEL
AST: 13 U/L (ref 0–37)
Alkaline Phosphatase: 114 U/L (ref 39–117)
BUN: 20 mg/dL (ref 6–23)
Creat: 1.08 mg/dL (ref 0.50–1.10)
Potassium: 5.1 mEq/L (ref 3.5–5.3)

## 2013-04-22 LAB — LIPID PANEL
LDL Cholesterol: 50 mg/dL (ref 0–99)
VLDL: 30 mg/dL (ref 0–40)

## 2013-04-22 LAB — GLUCOSE, FINGERSTICK (STAT): Glucose, fingerstick: 229 mg/dL — ABNORMAL HIGH (ref 70–99)

## 2013-04-22 MED ORDER — GLIPIZIDE ER 10 MG PO TB24: 10.0000 mg | ORAL_TABLET | Freq: Every morning | ORAL | Status: DC

## 2013-04-22 MED ORDER — INSULIN PEN NEEDLE 31G X 5 MM MISC: Status: DC

## 2013-04-22 MED ORDER — OXYCODONE-ACETAMINOPHEN 5-325 MG PO TABS: 1.0000 | ORAL_TABLET | Freq: Two times a day (BID) | ORAL | Status: DC | PRN

## 2013-04-22 NOTE — Assessment & Plan Note (Signed)
She is very reliable and her blood sugars have been extremely elevated despite a normal A1c one month ago. I have checked a urinalysis in the office which does not show any ketosis or glucose urea. I will obtain metabolic panel and CBC today as well. I will go ahead and start her on Lantus 10 units she was shown how to use this in the office as her blood sugars have been staying in the 300 to 400s. In the meantime I will increase her glipizide to 10 mg daily. She's to really watch her sugar intake and also increase her water intake to keep her blood sugars down. We will followup via phone with her blood sugars this week.

## 2013-04-22 NOTE — Progress Notes (Signed)
Subjective:    Patient ID: Stephanie Hammond, female    DOB: 1951/05/12, 61 y.o.   MRN: [id number]  HPI  Patient here secondary to elevated glucose. Over the past couple weeks her blood sugars have been upwards of 200s fasting in 3 to 400s during the day. These blood sugars are typically after meals. Over the weekend her blood sugar was 500 after eating dinner. She does admit to some polydipsia but denies polyuria. She states she feels fatigued and occasionally dizzy but denies any chest pain shortness of breath. She denies any UTI symptoms. She did try taking multiple doses of her glipizide one day but did not notice much difference in her blood sugars. Of note her last A1c was 5.8% last month. She states she has not changed anything with her diet does she then tells me that she ate pistachio putting this morning.   Review of Systems - per above  GEN- +fatigue, fever, weight loss,weakness, recent illness HEENT- denies eye drainage, change in vision, nasal discharge, CVS- denies chest pain, palpitations RESP- denies SOB, cough, wheeze ABD- denies N/V, change in stools, abd pain GU- denies dysuria, hematuria, dribbling, incontinence Neuro- denies headache, +dizziness, syncope, seizure activity       Objective:   Physical Exam GEN- NAD, alert and oriented x3 HEENT- PERRL, EOMI, non injected sclera, pink conjunctiva, MMM, oropharynx clear Neck- Supple, no LAD CVS- RRR, no murmur RESP-CTAB EXT- No edema,  Pulses- Radial 2+ Neuro-CNII-XII in tact      Assessment & Plan:

## 2013-04-22 NOTE — Patient Instructions (Addendum)
Glipizide increased to 10mg  daily Start lantus 10 units at bedtime  We will call in pen needles Take your blood sugars in morning fasting and before meals  F/U 4 weeks

## 2013-04-23 ENCOUNTER — Ambulatory Visit: Payer: Medicare Other | Admitting: Family Medicine

## 2013-04-25 ENCOUNTER — Telehealth: Payer: Self-pay | Admitting: Family Medicine

## 2013-04-25 NOTE — Telephone Encounter (Signed)
Left message to call me Monday morning

## 2013-04-25 NOTE — Telephone Encounter (Signed)
Please call pt and see what blood sugars are running? She is on lantus 10 units

## 2013-04-27 ENCOUNTER — Encounter (HOSPITAL_COMMUNITY): Payer: Self-pay

## 2013-04-27 ENCOUNTER — Emergency Department (HOSPITAL_COMMUNITY)
Admission: EM | Admit: 2013-04-27 | Discharge: 2013-04-27 | Disposition: A | Discharge status: Home / Self Care | Payer: Medicare Other | Attending: Emergency Medicine | Admitting: Emergency Medicine

## 2013-04-27 DIAGNOSIS — R109 Unspecified abdominal pain: Secondary | ICD-10-CM | POA: Insufficient documentation

## 2013-04-27 DIAGNOSIS — Z8739 Personal history of other diseases of the musculoskeletal system and connective tissue: Secondary | ICD-10-CM | POA: Insufficient documentation

## 2013-04-27 DIAGNOSIS — M109 Gout, unspecified: Secondary | ICD-10-CM

## 2013-04-27 DIAGNOSIS — E119 Type 2 diabetes mellitus without complications: Secondary | ICD-10-CM | POA: Insufficient documentation

## 2013-04-27 DIAGNOSIS — Z862 Personal history of diseases of the blood and blood-forming organs and certain disorders involving the immune mechanism: Secondary | ICD-10-CM | POA: Insufficient documentation

## 2013-04-27 DIAGNOSIS — K219 Gastro-esophageal reflux disease without esophagitis: Secondary | ICD-10-CM | POA: Insufficient documentation

## 2013-04-27 DIAGNOSIS — F17200 Nicotine dependence, unspecified, uncomplicated: Secondary | ICD-10-CM | POA: Insufficient documentation

## 2013-04-27 DIAGNOSIS — R079 Chest pain, unspecified: Secondary | ICD-10-CM | POA: Insufficient documentation

## 2013-04-27 DIAGNOSIS — Z880 Allergy status to penicillin: Secondary | ICD-10-CM | POA: Insufficient documentation

## 2013-04-27 DIAGNOSIS — Z794 Long term (current) use of insulin: Secondary | ICD-10-CM | POA: Insufficient documentation

## 2013-04-27 DIAGNOSIS — Z79899 Other long term (current) drug therapy: Secondary | ICD-10-CM | POA: Insufficient documentation

## 2013-04-27 DIAGNOSIS — I2510 Atherosclerotic heart disease of native coronary artery without angina pectoris: Secondary | ICD-10-CM | POA: Insufficient documentation

## 2013-04-27 MED ORDER — COLCHICINE 0.6 MG PO TABS
1.2000 mg | ORAL_TABLET | Freq: Once | ORAL | Status: AC
  Administered 2013-04-27: 1.2 mg via ORAL
  Filled 2013-04-27: qty 2

## 2013-04-27 MED ORDER — METHYLPREDNISOLONE SODIUM SUCC 125 MG IJ SOLR
125.0000 mg | Freq: Once | INTRAMUSCULAR | Status: AC
  Administered 2013-04-27: 125 mg via INTRAMUSCULAR
  Filled 2013-04-27: qty 2

## 2013-04-27 MED ORDER — OXYCODONE-ACETAMINOPHEN 5-325 MG PO TABS
1.0000 | ORAL_TABLET | Freq: Once | ORAL | Status: AC
  Administered 2013-04-27: 1 via ORAL
  Filled 2013-04-27: qty 1

## 2013-04-27 MED ORDER — COLCHICINE 0.6 MG PO TABS: 0.6000 mg | ORAL_TABLET | Freq: Every day | ORAL | Status: DC

## 2013-04-27 NOTE — ED Notes (Signed)
Pt c/o pain in r great toe since Friday.  Denies injury.  Reports history of gout.

## 2013-04-27 NOTE — ED Notes (Signed)
Pt taken to car in wheelchair. Per pt. Request.

## 2013-04-27 NOTE — ED Provider Notes (Signed)
CSN: [id number]     Arrival date & time 04/27/13  0800 History   First MD Initiated Contact with Patient 04/27/13 [phone]     Chief Complaint  Patient presents with  . Toe Pain   (Consider location/radiation/quality/duration/timing/severity/associated sxs/prior Treatment) HPI Comments: Pt reports pain of the right 1st toe that is similar to previous gout pain. No hx of injury. No hx of procedures or operations on the 1st toe.  Patient is a 62 y.o. female presenting with toe pain. The history is provided by the patient.  Toe Pain This is a recurrent problem. The current episode started in the past 7 days. The problem occurs daily. The problem has been gradually worsening. Associated symptoms include abdominal pain, arthralgias, chest pain and joint swelling. Pertinent negatives include no coughing, fever or neck pain. The symptoms are aggravated by walking and standing. She has tried nothing for the symptoms. The treatment provided no relief.    Past Medical History  Diagnosis Date  . IBS (irritable bowel syndrome)   . GERD (gastroesophageal reflux disease)   . Anemia   . History of GI diverticular bleed 08/2004  . Diverticulitis   . Diverticulosis   . CAD (coronary artery disease)   . Osteoarthritis   . Diabetes mellitus   . Blood transfusion 2009    after GI bleed   Past Surgical History  Procedure Laterality Date  . Ptca    . Vaginal hysterectomy    . Cholecystectomy  2003  . Replacement total knee      right   . Colonoscopy  2000    Dr. Russella Dar: marked diverticulosis, difficult procedure due to adhesions, polyps benign  . Colonoscopy  2006    Dr. Marina Goodell: severe pandiverticulosis  . Colonoscopy N/A 02/03/2013    SLF: 4 COLORECTAL POLYPS REMOVED/Moderate diverticulosis throughout the entire examined colon/Small internal hemorrhoids  . Joint replacement     Family History  Problem Relation Age of Onset  . Colon polyps Sister   . Cancer Mother     lung   . Cancer Father      stomach   . Stomach cancer Father     questionable  . Colon cancer Neg Hx    History  Substance Use Topics  . Smoking status: Current Every Day Smoker -- 1.00 packs/day for 35 years  . Smokeless tobacco: Never Used  . Alcohol Use: No   OB History   Grav Para Term Preterm Abortions TAB SAB Ect Mult Living                 Review of Systems  Constitutional: Negative for fever and activity change.       All ROS Neg except as noted in HPI  HENT: Negative for nosebleeds and neck pain.   Eyes: Negative for photophobia and discharge.  Respiratory: Negative for cough, shortness of breath and wheezing.   Cardiovascular: Positive for chest pain. Negative for palpitations.  Gastrointestinal: Positive for abdominal pain. Negative for blood in stool.  Genitourinary: Negative for dysuria, frequency and hematuria.  Musculoskeletal: Positive for joint swelling and arthralgias. Negative for back pain.  Skin: Negative.   Neurological: Negative for dizziness, seizures and speech difficulty.  Psychiatric/Behavioral: Negative for hallucinations and confusion.    Allergies  Ace inhibitors; Bentyl; and Penicillins  Home Medications   Current Outpatient Rx  Name  Route  Sig  Dispense  Refill  . FLUoxetine (PROZAC) 10 MG capsule   Oral   Take 10 mg by mouth  every morning.         . gabapentin (NEURONTIN) 300 MG capsule      Take 1 tablet BID and 2 at bedtime   120 capsule   3     Dose change   . gabapentin (NEURONTIN) 300 MG capsule      TAKE ONE CAPSULE BY MOUTH THREE TIMES DAILY   90 capsule   1   . glipiZIDE (GLUCOTROL XL) 10 MG 24 hr tablet   Oral   Take 1 tablet (10 mg total) by mouth every morning.   30 tablet   3   . ibuprofen (ADVIL,MOTRIN) 200 MG tablet   Oral   Take 400 mg by mouth every 6 (six) hours as needed for pain.         . Insulin Pen Needle 31G X 5 MM MISC      Apply to Lantus pen needles   30 each   3   . mupirocin ointment (BACTROBAN) 2 %    Topical   Apply topically 2 (two) times daily. To nose for 2 weeks   15 g   0   . omeprazole (PRILOSEC) 20 MG capsule   Oral   Take 20 mg by mouth every morning.         [name] oxyCODONE-acetaminophen (PERCOCET) 5-325 MG per tablet   Oral   Take 1 tablet by mouth 2 (two) times daily as needed for pain.   60 tablet   0   . simvastatin (ZOCOR) 10 MG tablet   Oral   Take 10 mg by mouth every morning.         . triamterene-hydrochlorothiazide (MAXZIDE-25) 37.5-25 MG per tablet   Oral   Take 1 tablet by mouth every morning.   30 tablet   3    BP 149/71  Pulse 103  Temp(Src) 98 F (36.7 C) (Oral)  Resp 18  SpO2 96% Physical Exam  Nursing note and vitals reviewed. Constitutional: She is oriented to person, place, and time. She appears well-developed and well-nourished.  Non-toxic appearance.  HENT:  Head: Normocephalic.  Right Ear: Tympanic membrane and external ear normal.  Left Ear: Tympanic membrane and external ear normal.  Eyes: EOM and lids are normal. Pupils are equal, round, and reactive to light.  Neck: Normal range of motion. Neck supple. Carotid bruit is not present.  Cardiovascular: Normal rate, regular rhythm, normal heart sounds, intact distal pulses and normal pulses.   Pulmonary/Chest: Breath sounds normal. No respiratory distress.  Abdominal: Soft. Bowel sounds are normal. There is no tenderness. There is no guarding.  Musculoskeletal: Normal range of motion.  There is pain, swelling and mild redness of the right 1st toe, mostly at the DIP. Pain with movement. DP 2+. No other hot area. No lesions between toes.  Lymphadenopathy:       Head (right side): No submandibular adenopathy present.       Head (left side): No submandibular adenopathy present.    She has no cervical adenopathy.  Neurological: She is alert and oriented to person, place, and time. She has normal strength. No cranial nerve deficit or sensory deficit.  Skin: Skin is warm and dry.   Psychiatric: She has a normal mood and affect. Her speech is normal.    ED Course  Procedures (including critical care time) Labs Review Labs Reviewed - No data to display Imaging Review No results found.  MDM  No diagnosis found. **I have reviewed nursing notes, vital signs, and  all appropriate lab and imaging results for this patient.*  Pt has hx of gout. She reports this pain is similar to previous gout episode. No hx of injury. Plan -  Solumedrol 125 and colchicine given in ED. Percocet tablet given to the patient to take home. Pt has Rx for percocet but has not fill the Rx yet. She will follow up with the primary MD next week.  Kathie Dike, PA-C 04/27/13 [phone]

## 2013-04-28 NOTE — Telephone Encounter (Signed)
Pt called back she went to ED on Sunday with gout pain, her BS spiked to 533 later on that day 429 and then 410, this morning it was 269 5 mins later 250 and 2 hrs later with medicine 180. ED doctor gave her colchryls 0.6mg  she has not taken it yet wants to know if its ok to take with her diabetes.

## 2013-04-28 NOTE — Telephone Encounter (Signed)
Pt aware of message

## 2013-04-28 NOTE — Telephone Encounter (Signed)
She can take the colcrys Have her increase lantus to 15 units at bedtime Continue checking blood sugar, drink more water Write down her readings and bring to the next visit

## 2013-04-30 NOTE — ED Provider Notes (Signed)
Medical screening examination/treatment/procedure(s) were performed by non-physician practitioner and as supervising physician I was immediately available for consultation/collaboration.    Roney Marion, MD 04/30/13 1053

## 2013-05-06 ENCOUNTER — Telehealth: Payer: Self-pay | Admitting: Family Medicine

## 2013-05-06 NOTE — Telephone Encounter (Signed)
Go up to lantus 15 units Okay to complete course of colrys for gout. Sugars should start to even out

## 2013-05-06 NOTE — Telephone Encounter (Signed)
Pt is concerned about her BS running too high her BS readings are as follows:  Sept 19- B-120 L- 135 D- 115 Sept 20- B- 165 L-210 D- 140 Sept 21- B- 145 L-533(had steroid shot this day) Sept 22- B- 429 after 7pm 410 Sept 23- B- 269 L- 210 D-188 Sept 24- B- 169 L- 245 D- 305 Sept 25- B-  151 L- 277 D- 310 after 7pm checked again at 10p 195 Sept 26- B-  227 L- 221 D- 190 Sept 27- B-  332 L- 210 D-  175 Sept 28- B-  155 L- 195 D-  225 Sept 29- B-  191 L-  229 D- 289 Sept 30- B-  289 L- 201  She has been taking colcrys 0.6 mg wants to know if this has anything to do with it?

## 2013-05-06 NOTE — Telephone Encounter (Signed)
Pt said that her sugar keeps getting higher and she wants to talk to the nurse about it. Please call when you get a chance.

## 2013-05-07 NOTE — Telephone Encounter (Signed)
Pt aware of message

## 2013-05-12 ENCOUNTER — Telehealth: Payer: Self-pay | Admitting: Family Medicine

## 2013-05-12 MED ORDER — INSULIN PEN NEEDLE 31G X 5 MM MISC: Status: DC

## 2013-05-12 MED ORDER — INSULIN GLARGINE 100 UNIT/ML SOLOSTAR PEN: 10.0000 [IU] | PEN_INJECTOR | Freq: Every day | SUBCUTANEOUS | Status: DC

## 2013-05-12 NOTE — Telephone Encounter (Signed)
Med refilled and sent to [facility] in [place] per pt request

## 2013-05-23 ENCOUNTER — Encounter: Payer: Self-pay | Admitting: Family Medicine

## 2013-05-23 ENCOUNTER — Ambulatory Visit (INDEPENDENT_AMBULATORY_CARE_PROVIDER_SITE_OTHER): Payer: Medicare Other | Admitting: Family Medicine

## 2013-05-23 VITALS — BP 108/60 | HR 88 | Temp 97.6°F | Resp 18 | Wt 228.0 lb

## 2013-05-23 DIAGNOSIS — E119 Type 2 diabetes mellitus without complications: Secondary | ICD-10-CM

## 2013-05-23 DIAGNOSIS — E1149 Type 2 diabetes mellitus with other diabetic neurological complication: Secondary | ICD-10-CM

## 2013-05-23 DIAGNOSIS — M5137 Other intervertebral disc degeneration, lumbosacral region: Secondary | ICD-10-CM

## 2013-05-23 DIAGNOSIS — M5136 Other intervertebral disc degeneration, lumbar region: Secondary | ICD-10-CM

## 2013-05-23 DIAGNOSIS — E1142 Type 2 diabetes mellitus with diabetic polyneuropathy: Secondary | ICD-10-CM

## 2013-05-23 LAB — GLUCOSE, FINGERSTICK (STAT): Glucose, fingerstick: 80 mg/dL (ref 70–99)

## 2013-05-23 MED ORDER — INSULIN GLARGINE 100 UNIT/ML SOLOSTAR PEN: 15.0000 [IU] | PEN_INJECTOR | Freq: Every day | SUBCUTANEOUS | Status: DC

## 2013-05-23 MED ORDER — OXYCODONE-ACETAMINOPHEN 5-325 MG PO TABS: 1.0000 | ORAL_TABLET | Freq: Two times a day (BID) | ORAL | Status: DC | PRN

## 2013-05-23 NOTE — Patient Instructions (Addendum)
Start the gabapentin at bedtime, then you increase to 1 tablet twice a day  Continue lantus 15 units daily Come in 4 weeks for labs- fasting

## 2013-05-25 NOTE — Progress Notes (Signed)
Subjective:    Patient ID: Stephanie Hammond, female    DOB: March 08, 1951, 62 y.o.   MRN: [id number]  HPI  Pt here to f/u DM. Was started on lantus about 4 weeks, secondary to elevated blood sugars. CBG have ranged from 200-300 fasting and post meals. She was seen in ED about 2 weeks ago and given steroids for gout and CBG elevated to 500's. She is now on Lantus 15 units daily along with her glipizide. Her morning CBG have been 150-170's. No hypoglycemia. She tells me she needs a new meter. As the strips are too expensive OTC?? ALso states she took her husbands metformin the past couple of days, though in the past could not tolerate this. She then tells me she has has been having more sharp pains from her back and that she stopped the gabapentin thinking this was for her blood sugar a few months ago and she was taking too much medicine at that time??  Review of Systems  GEN- denies fatigue, fever, weight loss,weakness, recent illness HEENT- denies eye drainage, change in vision, nasal discharge, CVS- denies chest pain, palpitations RESP- denies SOB, cough, wheeze Endo- denies polyuria, polydipsia MSK- + joint pain, muscle aches, injury Neuro- denies headache, dizziness, syncope, seizure activity       Objective:   Physical Exam GEN-NAD,alert and oriented x 3 Back- mild TTP lumbar region, neg SLR Neuro- normal gait. Senstaion in tact LE, motor equal bilat LE       Assessment & Plan:

## 2013-05-25 NOTE — Assessment & Plan Note (Signed)
Her CBG have really been uncontrolled, until addition of insulin Random CBG in office today 80 She did take 15 units this AM Will obtain A1C in 4 weeks fasting For now continue Lantus and glipizide, given new script for meter

## 2013-05-25 NOTE — Assessment & Plan Note (Addendum)
Restart gabapentin which also helps her diabetic neuropathy Percocet refilled

## 2013-05-26 ENCOUNTER — Telehealth: Payer: Self-pay | Admitting: Family Medicine

## 2013-05-26 ENCOUNTER — Encounter (HOSPITAL_COMMUNITY): Payer: Self-pay | Admitting: Emergency Medicine

## 2013-05-26 ENCOUNTER — Emergency Department (HOSPITAL_COMMUNITY)
Admission: EM | Admit: 2013-05-26 | Discharge: 2013-05-26 | Disposition: A | Discharge status: Home / Self Care | Payer: Medicare Other | Attending: Emergency Medicine | Admitting: Emergency Medicine

## 2013-05-26 DIAGNOSIS — Z862 Personal history of diseases of the blood and blood-forming organs and certain disorders involving the immune mechanism: Secondary | ICD-10-CM | POA: Insufficient documentation

## 2013-05-26 DIAGNOSIS — K219 Gastro-esophageal reflux disease without esophagitis: Secondary | ICD-10-CM | POA: Insufficient documentation

## 2013-05-26 DIAGNOSIS — E119 Type 2 diabetes mellitus without complications: Secondary | ICD-10-CM | POA: Insufficient documentation

## 2013-05-26 DIAGNOSIS — Z880 Allergy status to penicillin: Secondary | ICD-10-CM | POA: Insufficient documentation

## 2013-05-26 DIAGNOSIS — M109 Gout, unspecified: Secondary | ICD-10-CM

## 2013-05-26 DIAGNOSIS — Z79899 Other long term (current) drug therapy: Secondary | ICD-10-CM | POA: Insufficient documentation

## 2013-05-26 DIAGNOSIS — Z794 Long term (current) use of insulin: Secondary | ICD-10-CM | POA: Insufficient documentation

## 2013-05-26 DIAGNOSIS — F17200 Nicotine dependence, unspecified, uncomplicated: Secondary | ICD-10-CM | POA: Insufficient documentation

## 2013-05-26 DIAGNOSIS — I2510 Atherosclerotic heart disease of native coronary artery without angina pectoris: Secondary | ICD-10-CM | POA: Insufficient documentation

## 2013-05-26 DIAGNOSIS — M1990 Unspecified osteoarthritis, unspecified site: Secondary | ICD-10-CM | POA: Insufficient documentation

## 2013-05-26 MED ORDER — COLCHICINE 0.6 MG PO TABS: 0.6000 mg | ORAL_TABLET | Freq: Two times a day (BID) | ORAL | Status: DC

## 2013-05-26 MED ORDER — OXYCODONE-ACETAMINOPHEN 5-325 MG PO TABS: 1.0000 | ORAL_TABLET | ORAL | Status: DC | PRN

## 2013-05-26 MED ORDER — OXYCODONE-ACETAMINOPHEN 5-325 MG PO TABS
1.0000 | ORAL_TABLET | Freq: Once | ORAL | Status: AC
  Administered 2013-05-26: 1 via ORAL
  Filled 2013-05-26: qty 1

## 2013-05-26 MED ORDER — PREDNISONE 20 MG PO TABS: 40.0000 mg | ORAL_TABLET | Freq: Every day | ORAL | Status: DC

## 2013-05-26 MED ORDER — METHYLPREDNISOLONE SODIUM SUCC 125 MG IJ SOLR
125.0000 mg | Freq: Once | INTRAMUSCULAR | Status: AC
  Administered 2013-05-26: 125 mg via INTRAMUSCULAR
  Filled 2013-05-26: qty 2

## 2013-05-26 MED ORDER — COLCHICINE 0.6 MG PO TABS
0.6000 mg | ORAL_TABLET | Freq: Once | ORAL | Status: AC
  Administered 2013-05-26: 0.6 mg via ORAL
  Filled 2013-05-26: qty 1

## 2013-05-26 NOTE — ED Provider Notes (Signed)
CSN: [id number]     Arrival date & time 05/26/13  0214 History   First MD Initiated Contact with Patient 05/26/13 0315     Chief Complaint  Patient presents with  . Foot Pain  . gout?    (Consider location/radiation/quality/duration/timing/severity/associated sxs/prior Treatment) Patient is a 62 y.o. female presenting with lower extremity pain. The history is provided by the patient.  Foot Pain   6-year-old female history of gout comes in with progressive pain in her left ankle which has spread to the left foot. Pain started 2 days ago and has been getting progressively worse. It is worse with palpation and with standing. Nothing makes it better. She has tried oral acetaminophen, ibuprofen, and topical icy hot with no relief. Pain is rated at 9/10. She relates that she had a recent ED visit for gout in her right toe and received an injection and a prescription for colchicine which seemed to give relief.  Past Medical History  Diagnosis Date  . IBS (irritable bowel syndrome)   . GERD (gastroesophageal reflux disease)   . Anemia   . History of GI diverticular bleed 08/2004  . Diverticulitis   . Diverticulosis   . CAD (coronary artery disease)   . Osteoarthritis   . Diabetes mellitus   . Blood transfusion 2009    after GI bleed   Past Surgical History  Procedure Laterality Date  . Ptca    . Vaginal hysterectomy    . Cholecystectomy  2003  . Replacement total knee      right   . Colonoscopy  2000    Dr. Russella Dar: marked diverticulosis, difficult procedure due to adhesions, polyps benign  . Colonoscopy  2006    Dr. Marina Goodell: severe pandiverticulosis  . Colonoscopy N/A 02/03/2013    SLF: 4 COLORECTAL POLYPS REMOVED/Moderate diverticulosis throughout the entire examined colon/Small internal hemorrhoids  . Joint replacement     Family History  Problem Relation Age of Onset  . Colon polyps Sister   . Cancer Mother     lung   . Cancer Father     stomach   . Stomach cancer Father    questionable  . Colon cancer Neg Hx    History  Substance Use Topics  . Smoking status: Current Every Day Smoker -- 1.00 packs/day for 35 years  . Smokeless tobacco: Never Used  . Alcohol Use: No   OB History   Grav Para Term Preterm Abortions TAB SAB Ect Mult Living                 Review of Systems  All other systems reviewed and are negative.    Allergies  Ace inhibitors; Bentyl; and Penicillins  Home Medications   Current Outpatient Rx  Name  Route  Sig  Dispense  Refill  . colchicine 0.6 MG tablet   Oral   Take 1 tablet (0.6 mg total) by mouth daily.   15 tablet   0   . FLUoxetine (PROZAC) 10 MG capsule   Oral   Take 10 mg by mouth every morning.         . gabapentin (NEURONTIN) 300 MG capsule   Oral   Take 300-600 mg by mouth 3 (three) times daily. Take 1 capsule by mouth twice daily and 2 capsules at bedtime.         [name] glipiZIDE (GLUCOTROL XL) 10 MG 24 hr tablet   Oral   Take 1 tablet (10 mg total) by mouth every morning.  30 tablet   3   . Insulin Glargine (LANTUS SOLOSTAR) 100 UNIT/ML SOPN   Subcutaneous   Inject 15 Units into the skin at bedtime.   5 pen   3   . Insulin Pen Needle 31G X 5 MM MISC      Apply to Lantus pen needles   30 each   3   . omeprazole (PRILOSEC) 20 MG capsule   Oral   Take 20 mg by mouth every morning.         [name] oxyCODONE-acetaminophen (PERCOCET) 5-325 MG per tablet   Oral   Take 1 tablet by mouth 2 (two) times daily as needed for pain.   60 tablet   0   . simvastatin (ZOCOR) 10 MG tablet   Oral   Take 10 mg by mouth every morning.         . triamterene-hydrochlorothiazide (MAXZIDE-25) 37.5-25 MG per tablet   Oral   Take 1 tablet by mouth every morning.   30 tablet   3    BP 120/66  Pulse 108  Temp(Src) 98.2 F (36.8 C) (Oral)  Resp 20  Ht 5\' 3"  (1.6 m)  Wt 218 lb (98.884 kg)  BMI 38.63 kg/m2  SpO2 97% Physical Exam  Nursing note and vitals reviewed.  62 year old female, resting  comfortably and in no acute distress. Vital signs are significant for tachycardia with heart rate 108. Oxygen saturation is 97%, which is normal. Head is normocephalic and atraumatic. PERRLA, EOMI. Oropharynx is clear. Neck is nontender and supple without adenopathy or JVD. Back is nontender and there is no CVA tenderness. Lungs are clear without rales, wheezes, or rhonchi. Chest is nontender. Heart has regular rate and rhythm without murmur. Abdomen is soft, flat, nontender without masses or hepatosplenomegaly and peristalsis is normoactive. Extremities have no cyanosis or edema, full range of motion is present. There is mild swelling and warmth of the left ankle extending into the proximal aspect of the left foot with marked tenderness present. This is consistent with gout. Skin is warm and dry without rash. Neurologic: Mental status is normal, cranial nerves are intact, there are no motor or sensory deficits.  ED Course  Procedures (including critical care time)  MDM   1. Gout attack    Acute gouty arthritis involving the left ankle. Old records are reviewed and she had a recent ED visit for gout involving the right first toe. She was treated with methylprednisolone and colchicine and got good relief. She's given a dose of colchicine and methylprednisolone in the ED and senna with prescriptions for prednisone, colchicine, and oxycodone-acetaminophen. Given the fact that she has been having fairly frequent attacks, she probably should have acid level checked to be considered for uric acid lowering therapy if appropriate.    Dione Booze, MD 05/26/13 [phone]

## 2013-05-26 NOTE — Telephone Encounter (Signed)
Please tell pt , I noted she was in the ER She can take the colchicine but do not take the steroids Also she can not have the pain medication filled, as this is a violation of her contract and I just gave her a script for percocet 60 at our visit on Friday.  A uric acid level has been ordered, please advise her to get this drawn here at the clinic so we can see if it is truly gout in her foot

## 2013-05-26 NOTE — Telephone Encounter (Signed)
Pt aware of message

## 2013-05-26 NOTE — ED Notes (Signed)
Patient complaining of pain to left foot, denies injury. Reports history of gout. States recently had pain in right foot from gout.

## 2013-05-28 MED FILL — Oxycodone w/ Acetaminophen Tab 5-325 MG: ORAL | Qty: 6 | Status: AC

## 2013-06-19 ENCOUNTER — Telehealth: Payer: Self-pay | Admitting: *Deleted

## 2013-06-19 NOTE — Telephone Encounter (Signed)
?   Ok to refill, last refill 05/26/13 with quanitiy of 15

## 2013-06-19 NOTE — Telephone Encounter (Signed)
Okay to refill, quantity 30, take 1 daily , refill 1

## 2013-06-20 MED ORDER — COLCHICINE 0.6 MG PO TABS: 0.6000 mg | ORAL_TABLET | Freq: Every day | ORAL | Status: DC

## 2013-06-20 NOTE — Telephone Encounter (Signed)
Meds refilled.

## 2013-06-27 ENCOUNTER — Ambulatory Visit: Payer: Medicare Other | Admitting: Family Medicine

## 2013-06-30 ENCOUNTER — Telehealth: Payer: Self-pay | Admitting: Family Medicine

## 2013-06-30 MED ORDER — OXYCODONE-ACETAMINOPHEN 5-325 MG PO TABS: 1.0000 | ORAL_TABLET | Freq: Two times a day (BID) | ORAL | Status: DC | PRN

## 2013-06-30 NOTE — Telephone Encounter (Signed)
Patient needs her percocet refilled.

## 2013-06-30 NOTE — Telephone Encounter (Signed)
Ok to refill 

## 2013-06-30 NOTE — Telephone Encounter (Signed)
Med printed out, ready for provider signature and pt aware.

## 2013-06-30 NOTE — Telephone Encounter (Signed)
Okay to refill? 

## 2013-07-16 ENCOUNTER — Ambulatory Visit: Payer: Medicare Other | Admitting: Family Medicine

## 2013-07-16 ENCOUNTER — Other Ambulatory Visit: Payer: Self-pay | Admitting: Family Medicine

## 2013-07-16 NOTE — Telephone Encounter (Signed)
OKAY...

## 2013-07-16 NOTE — Telephone Encounter (Signed)
Med refilled.

## 2013-07-16 NOTE — Telephone Encounter (Signed)
Ok to refill 

## 2013-08-04 ENCOUNTER — Telehealth: Payer: Self-pay | Admitting: Family Medicine

## 2013-08-04 NOTE — Telephone Encounter (Signed)
Pt is needing a refill on her Percocet Call back number is [phone]

## 2013-08-04 NOTE — Telephone Encounter (Signed)
Okay to refill? 

## 2013-08-04 NOTE — Telephone Encounter (Signed)
.?   OK to Refill  Last refill 11/24

## 2013-08-05 ENCOUNTER — Telehealth: Payer: Self-pay | Admitting: *Deleted

## 2013-08-05 MED ORDER — OXYCODONE-ACETAMINOPHEN 5-325 MG PO TABS: 1.0000 | ORAL_TABLET | Freq: Two times a day (BID) | ORAL | Status: DC | PRN

## 2013-08-05 NOTE — Telephone Encounter (Signed)
Pt would like a call on her cell [phone] once medication is up front

## 2013-08-05 NOTE — Telephone Encounter (Signed)
Script prjnted signed and pt aware will pick up around 2

## 2013-08-05 NOTE — Telephone Encounter (Signed)
Pt called to see if her prescription was refilled and ready for pick up, script printed and ready for dr approval pt will pick up around 2 today.
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
## 2013-08-13 ENCOUNTER — Other Ambulatory Visit: Payer: Self-pay | Admitting: Family Medicine

## 2013-08-15 ENCOUNTER — Ambulatory Visit: Payer: Medicare Other | Admitting: Family Medicine

## 2013-08-19 ENCOUNTER — Encounter: Payer: Self-pay | Admitting: Family Medicine

## 2013-08-19 ENCOUNTER — Ambulatory Visit (INDEPENDENT_AMBULATORY_CARE_PROVIDER_SITE_OTHER): Payer: Medicare HMO | Admitting: Family Medicine

## 2013-08-19 VITALS — BP 130/68 | HR 98 | Temp 98.0°F | Resp 18 | Ht 61.5 in | Wt 228.0 lb

## 2013-08-19 DIAGNOSIS — M109 Gout, unspecified: Secondary | ICD-10-CM

## 2013-08-19 DIAGNOSIS — E669 Obesity, unspecified: Secondary | ICD-10-CM

## 2013-08-19 DIAGNOSIS — E119 Type 2 diabetes mellitus without complications: Secondary | ICD-10-CM

## 2013-08-19 DIAGNOSIS — E1149 Type 2 diabetes mellitus with other diabetic neurological complication: Secondary | ICD-10-CM

## 2013-08-19 DIAGNOSIS — Z720 Tobacco use: Secondary | ICD-10-CM

## 2013-08-19 DIAGNOSIS — F17200 Nicotine dependence, unspecified, uncomplicated: Secondary | ICD-10-CM

## 2013-08-19 DIAGNOSIS — I10 Essential (primary) hypertension: Secondary | ICD-10-CM

## 2013-08-19 LAB — BASIC METABOLIC PANEL
BUN: 19 mg/dL (ref 6–23)
CALCIUM: 9.5 mg/dL (ref 8.4–10.5)
CO2: 26 meq/L (ref 19–32)
CREATININE: 1.05 mg/dL (ref 0.50–1.10)
Chloride: 104 mEq/L (ref 96–112)
GLUCOSE: 196 mg/dL — AB (ref 70–99)
Potassium: 4.8 mEq/L (ref 3.5–5.3)
SODIUM: 137 meq/L (ref 135–145)

## 2013-08-19 LAB — HEMOGLOBIN A1C, FINGERSTICK: Hgb A1C (fingerstick): 7.5 % — ABNORMAL HIGH (ref <5.7)

## 2013-08-19 LAB — URIC ACID: URIC ACID, SERUM: 8.4 mg/dL — AB (ref 2.4–7.0)

## 2013-08-19 MED ORDER — INSULIN GLARGINE 100 UNIT/ML SOLOSTAR PEN: PEN_INJECTOR | SUBCUTANEOUS | Status: DC

## 2013-08-19 MED ORDER — METFORMIN HCL ER 500 MG PO TB24: 500.0000 mg | ORAL_TABLET | Freq: Every day | ORAL | Status: DC

## 2013-08-19 NOTE — Assessment & Plan Note (Signed)
Her blood pressure is still well-controlled. She has an allergy to ACE inhibitors

## 2013-08-19 NOTE — Assessment & Plan Note (Signed)
Continue to encourage tobacco cessation she's not ready to quit

## 2013-08-19 NOTE — Progress Notes (Signed)
Subjective:    Patient ID: Stephanie Hammond, female    DOB: 12-03-1950, 63 y.o.   MRN: [id number]  HPI Patient here to followup diabetes mellitus. Her last A1c was 5.8% but this was about 5 months ago. She is failed to come get her labs done on multiple occasions. She's currently on Lantus 15 units. Her blood sugars fasting have ranged 150-to mid-200s. In the afternoon she has been up to 300. She has not been watching her diet and has gained 10 pounds since her last visit. She states she would like to retry the metformin because it helps control her sugars better although he did give her diarrhea. Denies polyuria, polydipsia.  She declines flu shot and Pneumovax   Review of Systems  GEN- denies fatigue, fever, weight loss,weakness, recent illness HEENT- denies eye drainage, change in vision, nasal discharge, CVS- denies chest pain, palpitations RESP- denies SOB, cough, wheeze MSK- + joint pain, muscle aches, injury Neuro- denies headache, dizziness, syncope, seizure activity      Objective:   Physical Exam GEN- NAD, alert and oriented x3 HEENT- PERRL, EOMI, non injected sclera, pink conjunctiva, MMM, oropharynx clear Neck- Supple,  CVS- RRR, no murmur RESP-CTAB EXT- No edema Pulses- Radial, DP- 2+        Assessment & Plan:

## 2013-08-19 NOTE — Patient Instructions (Signed)
Start Metformin once a day with breakfast Continue glipizide Increase lantus to 20 units Keep sugar fasting ( 90-120), check 2 hours after meals goal ( < 180) F/U 12 weeks

## 2013-08-19 NOTE — Assessment & Plan Note (Signed)
Discussed diabetic diet and need for exercise. Short-term goals were set. She was given handouts regarding diabetic foods. She declines nutritionist at this time

## 2013-08-19 NOTE — Assessment & Plan Note (Signed)
Her A1c has deteriorated unfortunately we do not have an A1c in the middle for treatment. Increase Lantus to 20 units. Start metformin 500 mg extended release once a day continue glipizide extended release once a day

## 2013-08-21 ENCOUNTER — Telehealth: Payer: Self-pay | Admitting: *Deleted

## 2013-08-21 NOTE — Telephone Encounter (Signed)
Ok to refill 

## 2013-08-22 ENCOUNTER — Emergency Department (HOSPITAL_COMMUNITY): Payer: Medicare HMO

## 2013-08-22 ENCOUNTER — Encounter (HOSPITAL_COMMUNITY): Payer: Self-pay | Admitting: Emergency Medicine

## 2013-08-22 ENCOUNTER — Emergency Department (HOSPITAL_COMMUNITY)
Admission: EM | Admit: 2013-08-22 | Discharge: 2013-08-22 | Disposition: A | Discharge status: Home / Self Care | Payer: Medicare HMO | Attending: Emergency Medicine | Admitting: Emergency Medicine

## 2013-08-22 ENCOUNTER — Other Ambulatory Visit: Payer: Self-pay | Admitting: Family Medicine

## 2013-08-22 DIAGNOSIS — I2510 Atherosclerotic heart disease of native coronary artery without angina pectoris: Secondary | ICD-10-CM | POA: Insufficient documentation

## 2013-08-22 DIAGNOSIS — M79672 Pain in left foot: Secondary | ICD-10-CM

## 2013-08-22 DIAGNOSIS — Z880 Allergy status to penicillin: Secondary | ICD-10-CM | POA: Insufficient documentation

## 2013-08-22 DIAGNOSIS — Z862 Personal history of diseases of the blood and blood-forming organs and certain disorders involving the immune mechanism: Secondary | ICD-10-CM | POA: Insufficient documentation

## 2013-08-22 DIAGNOSIS — K219 Gastro-esophageal reflux disease without esophagitis: Secondary | ICD-10-CM | POA: Insufficient documentation

## 2013-08-22 DIAGNOSIS — F17200 Nicotine dependence, unspecified, uncomplicated: Secondary | ICD-10-CM | POA: Insufficient documentation

## 2013-08-22 DIAGNOSIS — M109 Gout, unspecified: Secondary | ICD-10-CM | POA: Insufficient documentation

## 2013-08-22 DIAGNOSIS — Z794 Long term (current) use of insulin: Secondary | ICD-10-CM | POA: Insufficient documentation

## 2013-08-22 DIAGNOSIS — G8929 Other chronic pain: Secondary | ICD-10-CM | POA: Insufficient documentation

## 2013-08-22 DIAGNOSIS — M25579 Pain in unspecified ankle and joints of unspecified foot: Secondary | ICD-10-CM | POA: Insufficient documentation

## 2013-08-22 DIAGNOSIS — E119 Type 2 diabetes mellitus without complications: Secondary | ICD-10-CM | POA: Insufficient documentation

## 2013-08-22 DIAGNOSIS — Z79899 Other long term (current) drug therapy: Secondary | ICD-10-CM | POA: Insufficient documentation

## 2013-08-22 DIAGNOSIS — M1990 Unspecified osteoarthritis, unspecified site: Secondary | ICD-10-CM | POA: Insufficient documentation

## 2013-08-22 MED ORDER — OXYCODONE-ACETAMINOPHEN 5-325 MG PO TABS: 1.0000 | ORAL_TABLET | ORAL | Status: DC | PRN

## 2013-08-22 MED ORDER — COLCHICINE 0.6 MG PO TABS: 0.6000 mg | ORAL_TABLET | Freq: Two times a day (BID) | ORAL | Status: DC

## 2013-08-22 MED ORDER — OLMESARTAN MEDOXOMIL 20 MG PO TABS: 20.0000 mg | ORAL_TABLET | Freq: Every day | ORAL | Status: DC

## 2013-08-22 MED ORDER — ALLOPURINOL 100 MG PO TABS: 100.0000 mg | ORAL_TABLET | Freq: Every day | ORAL | Status: DC

## 2013-08-22 MED ORDER — OXYCODONE HCL 5 MG PO TABS
5.0000 mg | ORAL_TABLET | Freq: Once | ORAL | Status: AC
  Administered 2013-08-22: 5 mg via ORAL
  Filled 2013-08-22: qty 1

## 2013-08-22 NOTE — ED Notes (Signed)
Pt reports "my gout flaring up" to left foot.

## 2013-08-22 NOTE — Telephone Encounter (Signed)
Pt aware of teh percocet, ED has given her 3 pills to help with the pain, Called dr. Buelah Manis to make sure it was ok snce she is on a pain contract, Dr. D verified it is ok to take.

## 2013-08-22 NOTE — ED Provider Notes (Signed)
CSN: [id number]     Arrival date & time 08/22/13  1437 History   First MD Initiated Contact with Patient 08/22/13 1527     Chief Complaint  Patient presents with  . Gout   (Consider location/radiation/quality/duration/timing/severity/associated sxs/prior Treatment) HPI Stephanie Hammond is a 63 y.o. female who presents to the ED with left foot pain. She was evaluated by her doctor 08/19/2013. They discussed her chronic pain and she is unable to get a refill on pain medication until the end of the month. She thinks she is having a flair up of Gout. She called her doctor today and was given Colchicine. She took one but said it hasn't helped and is here for pain management.    Past Medical History  Diagnosis Date  . IBS (irritable bowel syndrome)   . GERD (gastroesophageal reflux disease)   . Anemia   . History of GI diverticular bleed 08/2004  . Diverticulitis   . Diverticulosis   . CAD (coronary artery disease)   . Osteoarthritis   . Diabetes mellitus   . Blood transfusion 2009    after GI bleed   Past Surgical History  Procedure Laterality Date  . Ptca    . Vaginal hysterectomy    . Cholecystectomy  2003  . Replacement total knee      right   . Colonoscopy  2000    Dr. Fuller Plan: marked diverticulosis, difficult procedure due to adhesions, polyps benign  . Colonoscopy  2006    Dr. Henrene Pastor: severe pandiverticulosis  . Colonoscopy N/A 02/03/2013    SLF: 4 COLORECTAL POLYPS REMOVED/Moderate diverticulosis throughout the entire examined colon/Small internal hemorrhoids  . Joint replacement     Family History  Problem Relation Age of Onset  . Colon polyps Sister   . Cancer Mother     lung   . Cancer Father     stomach   . Stomach cancer Father     questionable  . Colon cancer Neg Hx    History  Substance Use Topics  . Smoking status: Current Every Day Smoker -- 1.00 packs/day for 35 years    Types: Cigarettes  . Smokeless tobacco: Never Used  . Alcohol Use: No   OB  History   Grav Para Term Preterm Abortions TAB SAB Ect Mult Living                 Review of Systems Negative except as stated in HPI  Allergies  Ace inhibitors; Bentyl; and Penicillins  Home Medications   Current Outpatient Rx  Name  Route  Sig  Dispense  Refill  . colchicine 0.6 MG tablet   Oral   Take 1 tablet (0.6 mg total) by mouth 2 (two) times daily.   15 tablet   2   . gabapentin (NEURONTIN) 300 MG capsule   Oral   Take 300-600 mg by mouth 3 (three) times daily. Take 1 capsule by mouth twice daily and 2 capsules at bedtime.         [name] glipiZIDE (GLUCOTROL XL) 10 MG 24 hr tablet   Oral   Take 1 tablet (10 mg total) by mouth every morning.   30 tablet   3   . Insulin Glargine (LANTUS SOLOSTAR) 100 UNIT/ML Solostar Pen      Lantus 20 units at bedtime   5 pen   3   . Insulin Pen Needle 31G X 5 MM MISC      Apply to Lantus pen needles  30 each   3   . metFORMIN (GLUCOPHAGE XR) 500 MG 24 hr tablet   Oral   Take 1 tablet (500 mg total) by mouth daily with breakfast.   30 tablet   3   . olmesartan (BENICAR) 20 MG tablet   Oral   Take 1 tablet (20 mg total) by mouth daily.   30 tablet   3   . omeprazole (PRILOSEC) 20 MG capsule   Oral   Take 20 mg by mouth every morning.         [name] oxyCODONE-acetaminophen (PERCOCET) 5-325 MG per tablet   Oral   Take 1 tablet by mouth 2 (two) times daily as needed.   60 tablet   0   . simvastatin (ZOCOR) 10 MG tablet   Oral   Take 10 mg by mouth every morning.          BP 130/61  Pulse 115  Temp(Src) 98.7 F (37.1 C) (Oral)  Resp 20  Ht 5\' 2"  (1.575 m)  Wt 228 lb (103.42 kg)  BMI 41.69 kg/m2  SpO2 96% Physical Exam  Nursing note and vitals reviewed. Constitutional: She is oriented to person, place, and time. She appears well-developed and well-nourished. No distress.  HENT:  Head: Normocephalic and atraumatic.  Eyes: EOM are normal.  Neck: Neck supple.  Pulmonary/Chest: Effort normal.    Musculoskeletal:       Left foot: She exhibits tenderness. She exhibits normal range of motion, no swelling, no deformity and no laceration.       Feet:  There is tenderness of the dorsal aspect of the foot with palpation. There is no joint tenderness. There is full range of motion of the ankle. Good touch sensation. Patient ambulatory to the ED. Pedal pulse strong, adequate circulation.   Neurological: She is alert and oriented to person, place, and time. No cranial nerve deficit.  Skin: Skin is warm and dry.  Psychiatric: She has a normal mood and affect. Her behavior is normal.    Patient states she usually comes and gets a shot when her pain is this bad. She said she needs one for pain and a shot of cortisone. She request to speak with the MD in charge. Dr. Wyvonnia Dusky in to talk with the patient. Will x-ray the foot. Percocet given for pain.  Dg Foot Complete Left  08/22/2013   CLINICAL DATA:  Pain across top of foot for 1 day with no known injury  EXAM: LEFT FOOT - COMPLETE 3+ VIEW  COMPARISON:  None.  FINDINGS: There is no evidence of fracture or dislocation. There is no evidence of arthropathy or other focal bone abnormality. Soft tissues are unremarkable. Incidental note is made of a small to moderate heel spur.  IMPRESSION: No significant abnormalities   Electronically Signed   By: Skipper Cliche M.D.   On: 08/22/2013 16:00     Patient asked again about a steroid shot. I discussed with the patient that since she is taking Colchicine that I would not want to give cortisone because it will decrease the effect of the Colchicine. She voices understanding. I will give her Percocet # 3 tablets with instructions to follow up with PCP.   ED Course  Procedures   MDM  63 y.o. female with left foot pain. Stable for discharge to follow up with her PCP for further pain management.     Ashley Mur[place], [place] 08/22/13 [phone]

## 2013-08-22 NOTE — Telephone Encounter (Signed)
Having gout flair up need RF Colichicine.  Rf done.  Also still wanting to know about refill Percocet.

## 2013-08-22 NOTE — ED Notes (Signed)
Pain lt foot, thinks is from gout,  Seen by her MD 1/13 . Says she has taken percocet without relief.  Has not taken today. Also took 1 colchicine today.

## 2013-08-22 NOTE — Telephone Encounter (Signed)
Percocet can not be filled until Jan 28th Please give her the lab results, there are some new medications because of her gout

## 2013-08-22 NOTE — Progress Notes (Signed)
LMTRC

## 2013-08-22 NOTE — ED Provider Notes (Signed)
Medical screening examination/treatment/procedure(s) were conducted as a shared visit with non-physician practitioner(s) and myself.  I personally evaluated the patient during the encounter.  TTP L dorsal foot. +2 DP and PT pulse. No erythema or edema.  FROM MTP and ankle joint.  Does not appear consistent with gout as does not involve joint.  EKG Interpretation   None         Ezequiel Essex, MD 08/22/13 2306

## 2013-08-27 ENCOUNTER — Telehealth: Payer: Self-pay | Admitting: *Deleted

## 2013-08-27 MED ORDER — LOSARTAN POTASSIUM 50 MG PO TABS: 50.0000 mg | ORAL_TABLET | Freq: Every day | ORAL | Status: DC

## 2013-08-27 NOTE — Telephone Encounter (Signed)
Pharmacy sent over fax stating that her insurance does not cover the medication and pt has asked to change to generic, per RX benefit plan alternative medication includes Losartan,Diovan, Micardis. Dr. Buelah Manis approved Losartan 50mg  one tablet po qd.

## 2013-08-28 ENCOUNTER — Telehealth: Payer: Self-pay | Admitting: *Deleted

## 2013-08-28 MED ORDER — SIMVASTATIN 10 MG PO TABS: 10.0000 mg | ORAL_TABLET | Freq: Every morning | ORAL | Status: DC

## 2013-08-28 NOTE — Telephone Encounter (Signed)
Medication refilled

## 2013-09-01 ENCOUNTER — Telehealth: Payer: Self-pay | Admitting: Family Medicine

## 2013-09-01 NOTE — Telephone Encounter (Signed)
Okay to refill? 

## 2013-09-01 NOTE — Telephone Encounter (Signed)
?   Ok to refill percocet and gabapentin last refill on percocet 08/05/13 last ov 08/19/13

## 2013-09-01 NOTE — Telephone Encounter (Signed)
Call back number is [phone] Pt is needing a refill on her percocet, gabapentin Pharmacy is [facility] in [place]

## 2013-09-02 MED ORDER — GABAPENTIN 300 MG PO CAPS: ORAL_CAPSULE | ORAL | Status: DC

## 2013-09-02 MED ORDER — OXYCODONE-ACETAMINOPHEN 5-325 MG PO TABS: 1.0000 | ORAL_TABLET | Freq: Two times a day (BID) | ORAL | Status: DC | PRN

## 2013-09-02 NOTE — Telephone Encounter (Signed)
Pt aware of approval and told Rx will be ready for pick up this afternoon.

## 2013-09-04 ENCOUNTER — Telehealth: Payer: Self-pay | Admitting: Family Medicine

## 2013-09-04 NOTE — Telephone Encounter (Signed)
Pt was suppose to have got some lancets she states from when she had her last OV but she never got it Call back number is [phone]

## 2013-09-04 NOTE — Telephone Encounter (Signed)
Pt came in to office today to receive lantus solostar pen

## 2013-09-18 ENCOUNTER — Other Ambulatory Visit: Payer: Self-pay | Admitting: *Deleted

## 2013-09-18 MED ORDER — INSULIN DETEMIR 100 UNIT/ML FLEXPEN: 20.0000 [IU] | PEN_INJECTOR | Freq: Every day | SUBCUTANEOUS | Status: DC

## 2013-09-18 NOTE — Telephone Encounter (Signed)
pts insurance [facility] has provided temporary supply of LANTUS INJ SOLOSTAR pts insurance no longer covers changed med to [facility] pen, pt is aware.

## 2013-09-29 ENCOUNTER — Telehealth: Payer: Self-pay | Admitting: Family Medicine

## 2013-09-29 MED ORDER — OXYCODONE-ACETAMINOPHEN 5-325 MG PO TABS: 1.0000 | ORAL_TABLET | Freq: Two times a day (BID) | ORAL | Status: DC | PRN

## 2013-09-29 NOTE — Telephone Encounter (Signed)
Call back number is [phone] Pt is needing a refill on her percocet and she is wanting to know if it can be ready by the time her friend is seen her today around 10ish

## 2013-09-29 NOTE — Telephone Encounter (Signed)
MED REFILLED AND PT AWARE CAN NOT GET REFILLED UNTIL 02/26

## 2013-09-29 NOTE — Telephone Encounter (Signed)
?   Ok to refill, last refill 09/02/13, last ov 08/19/13

## 2013-09-29 NOTE — Telephone Encounter (Signed)
Okay to refill, can not get filled  until 2/26

## 2013-10-06 ENCOUNTER — Emergency Department (HOSPITAL_COMMUNITY)
Admission: EM | Admit: 2013-10-06 | Discharge: 2013-10-06 | Disposition: A | Discharge status: Home / Self Care | Payer: Medicare HMO | Attending: Emergency Medicine | Admitting: Emergency Medicine

## 2013-10-06 ENCOUNTER — Encounter (HOSPITAL_COMMUNITY): Payer: Self-pay | Admitting: Emergency Medicine

## 2013-10-06 ENCOUNTER — Telehealth: Payer: Self-pay | Admitting: Family Medicine

## 2013-10-06 DIAGNOSIS — Z79899 Other long term (current) drug therapy: Secondary | ICD-10-CM | POA: Insufficient documentation

## 2013-10-06 DIAGNOSIS — Z862 Personal history of diseases of the blood and blood-forming organs and certain disorders involving the immune mechanism: Secondary | ICD-10-CM | POA: Insufficient documentation

## 2013-10-06 DIAGNOSIS — F17200 Nicotine dependence, unspecified, uncomplicated: Secondary | ICD-10-CM | POA: Insufficient documentation

## 2013-10-06 DIAGNOSIS — E119 Type 2 diabetes mellitus without complications: Secondary | ICD-10-CM | POA: Insufficient documentation

## 2013-10-06 DIAGNOSIS — M109 Gout, unspecified: Secondary | ICD-10-CM | POA: Insufficient documentation

## 2013-10-06 DIAGNOSIS — Z794 Long term (current) use of insulin: Secondary | ICD-10-CM | POA: Insufficient documentation

## 2013-10-06 DIAGNOSIS — K219 Gastro-esophageal reflux disease without esophagitis: Secondary | ICD-10-CM | POA: Insufficient documentation

## 2013-10-06 DIAGNOSIS — M1990 Unspecified osteoarthritis, unspecified site: Secondary | ICD-10-CM | POA: Insufficient documentation

## 2013-10-06 DIAGNOSIS — I2510 Atherosclerotic heart disease of native coronary artery without angina pectoris: Secondary | ICD-10-CM | POA: Insufficient documentation

## 2013-10-06 DIAGNOSIS — Z880 Allergy status to penicillin: Secondary | ICD-10-CM | POA: Insufficient documentation

## 2013-10-06 HISTORY — DX: Gout, unspecified: M10.9

## 2013-10-06 LAB — CBG MONITORING, ED: Glucose-Capillary: 117 mg/dL — ABNORMAL HIGH (ref 70–99)

## 2013-10-06 MED ORDER — INDOMETHACIN 50 MG PO CAPS: ORAL_CAPSULE | ORAL | Status: DC

## 2013-10-06 MED ORDER — OMEPRAZOLE 20 MG PO CPDR: 20.0000 mg | DELAYED_RELEASE_CAPSULE | Freq: Every morning | ORAL | Status: DC

## 2013-10-06 MED ORDER — OXYCODONE-ACETAMINOPHEN 5-325 MG PO TABS
1.0000 | ORAL_TABLET | Freq: Once | ORAL | Status: AC
Start: 2013-10-06 — End: 2013-10-06
  Administered 2013-10-06: 1 via ORAL
  Filled 2013-10-06: qty 1

## 2013-10-06 MED ORDER — INDOMETHACIN 25 MG PO CAPS
50.0000 mg | ORAL_CAPSULE | Freq: Once | ORAL | Status: AC
  Administered 2013-10-06: 50 mg via ORAL
  Filled 2013-10-06: qty 2

## 2013-10-06 MED ORDER — OXYCODONE-ACETAMINOPHEN 5-325 MG PO TABS: 1.0000 | ORAL_TABLET | ORAL | Status: DC | PRN

## 2013-10-06 MED ORDER — KETOROLAC TROMETHAMINE 60 MG/2ML IM SOLN
60.0000 mg | Freq: Once | INTRAMUSCULAR | Status: AC
  Administered 2013-10-06: 60 mg via INTRAMUSCULAR
  Filled 2013-10-06: qty 2

## 2013-10-06 NOTE — ED Provider Notes (Signed)
CSN: [id number]     Arrival date & time 10/06/13  0901 History   First MD Initiated Contact with Patient 10/06/13 [phone]     Chief Complaint  Patient presents with  . Gout     (Consider location/radiation/quality/duration/timing/severity/associated sxs/prior Treatment) HPI Comments: Stephanie Hammond is a 63 y.o. Female presenting with a 1 day history of right ankle pain, swelling and redness which is similar to previous flare ups of her gout. She takes allopurinol daily but still has occasional breakthrough flare ups.  She denies injury to the joint and also denies fevers, chills, rash or skin injury at the site.  She has used elevation and warm compresses without relief, stating the weight of the warm towels makes her pain worse.  She is a diabetic,  Her cbgs have been well controlled.     The history is provided by the patient.    Past Medical History  Diagnosis Date  . IBS (irritable bowel syndrome)   . GERD (gastroesophageal reflux disease)   . Anemia   . History of GI diverticular bleed 08/2004  . Diverticulitis   . Diverticulosis   . CAD (coronary artery disease)   . Osteoarthritis   . Diabetes mellitus   . Blood transfusion 2009    after GI bleed  . Gout    Past Surgical History  Procedure Laterality Date  . Ptca    . Vaginal hysterectomy    . Cholecystectomy  2003  . Replacement total knee      right   . Colonoscopy  2000    Dr. Fuller Plan: marked diverticulosis, difficult procedure due to adhesions, polyps benign  . Colonoscopy  2006    Dr. Henrene Pastor: severe pandiverticulosis  . Colonoscopy N/A 02/03/2013    SLF: 4 COLORECTAL POLYPS REMOVED/Moderate diverticulosis throughout the entire examined colon/Small internal hemorrhoids  . Joint replacement     Family History  Problem Relation Age of Onset  . Colon polyps Sister   . Cancer Mother     lung   . Cancer Father     stomach   . Stomach cancer Father     questionable  . Colon cancer Neg Hx    History  Substance  Use Topics  . Smoking status: Current Every Day Smoker -- 1.00 packs/day for 35 years    Types: Cigarettes  . Smokeless tobacco: Never Used  . Alcohol Use: No   OB History   Grav Para Term Preterm Abortions TAB SAB Ect Mult Living   2 2 2       2      Review of Systems  Musculoskeletal: Positive for arthralgias and joint swelling.  Skin: Positive for color change. Negative for rash and wound.  Neurological: Negative for weakness and numbness.      Allergies  Ace inhibitors; Bentyl; and Penicillins  Home Medications   Current Outpatient Rx  Name  Route  Sig  Dispense  Refill  . allopurinol (ZYLOPRIM) 100 MG tablet   Oral   Take 100 mg by mouth daily.         [name] gabapentin (NEURONTIN) 300 MG capsule   Oral   Take 300 mg by mouth daily.         [name] ibuprofen (ADVIL,MOTRIN) 200 MG tablet   Oral   Take 400 mg by mouth every 6 (six) hours as needed for moderate pain.         . Insulin Glargine (LANTUS SOLOSTAR) 100 UNIT/ML Solostar Pen  Lantus 20 units at bedtime   5 pen   3   . losartan (COZAAR) 50 MG tablet   Oral   Take 1 tablet (50 mg total) by mouth daily.   30 tablet   3   . metFORMIN (GLUCOPHAGE XR) 500 MG 24 hr tablet   Oral   Take 1 tablet (500 mg total) by mouth daily with breakfast.   30 tablet   3   . olmesartan (BENICAR) 20 MG tablet   Oral   Take 1 tablet (20 mg total) by mouth daily.   30 tablet   3   . oxyCODONE-acetaminophen (PERCOCET) 5-325 MG per tablet   Oral   Take 1 tablet by mouth 2 (two) times daily as needed.   60 tablet   0     DO NOT REFILL UNTIL 10/02/13   . simvastatin (ZOCOR) 10 MG tablet   Oral   Take 1 tablet (10 mg total) by mouth every morning.   30 tablet   3   . indomethacin (INDOCIN) 50 MG capsule      Take one capsule by mouth every 8 hours until pain is resolved.   15 capsule   0   . Insulin Pen Needle 31G X 5 MM MISC      Apply to Lantus pen needles   30 each   3   . omeprazole (PRILOSEC)  20 MG capsule   Oral   Take 1 capsule (20 mg total) by mouth every morning.   60 capsule   3   . oxyCODONE-acetaminophen (PERCOCET/ROXICET) 5-325 MG per tablet   Oral   Take 1 tablet by mouth every 4 (four) hours as needed for severe pain.   5 tablet   0    BP 149/92  Pulse 108  Temp(Src) 98.5 F (36.9 C) (Oral)  Resp 16  Ht 5\' 3"  (1.6 m)  Wt 228 lb (103.42 kg)  BMI 40.40 kg/m2  SpO2 96% Physical Exam  Nursing note and vitals reviewed. Constitutional: She appears well-developed and well-nourished.  HENT:  Head: Normocephalic.  Cardiovascular: Normal rate and intact distal pulses.  Exam reveals no decreased pulses.   Pulses:      Dorsalis pedis pulses are 2+ on the right side, and 2+ on the left side.       Posterior tibial pulses are 2+ on the right side, and 2+ on the left side.  Musculoskeletal: She exhibits edema and tenderness.       Right ankle: She exhibits decreased range of motion and swelling. She exhibits normal pulse. Tenderness. Lateral malleolus tenderness found. No head of 5th metatarsal and no proximal fibula tenderness found. Achilles tendon normal.  Localized tenderness along lateral and anterior right ankle joint. Erythema without red streaking.  Tender to even light touch.  Pedal pulses intact.  Neurological: She is alert. No sensory deficit.  Skin: Skin is warm, dry and intact. No abrasion and no laceration noted.    ED Course  Procedures (including critical care time) Labs Review Labs Reviewed  CBG MONITORING, ED - Abnormal; Notable for the following:    Glucose-Capillary 117 (*)    All other components within normal limits   Imaging Review No results found.   EKG Interpretation None      MDM   Final diagnoses:  Gout attack    Exam c/w gout as is patients history of previous episodes of same.  No injury, pt denies trauma.  Skin is intact,  Doubt infection.  Pt was placed on indomethacin, few oxycodone given (she can fill her normal  monthly dose of oxycodone from pcp tomorrow, took her last dose this am with this gouty flare).plan f/u with pcp for worsened or prolonged sx,  Advised elevation, heat tx.  The patient appears reasonably screened and/or stabilized for discharge and I doubt any other medical condition or other [facility] requiring further screening, evaluation, or treatment in the ED at this time prior to discharge.     Evalee Jefferson, PA-C 10/07/13 2243

## 2013-10-06 NOTE — Discharge Instructions (Signed)

## 2013-10-06 NOTE — Telephone Encounter (Signed)
Refill appropriate and filled per protocol. 

## 2013-10-06 NOTE — Telephone Encounter (Signed)
Call back number is Hammond Pt is needing a refill on omeprazole (PRILOSEC) 20 MG capsule

## 2013-10-06 NOTE — Addendum Note (Signed)
Addended by: Sheral Flow on: 10/06/2013 04:45 PM   Modules accepted: Orders

## 2013-10-06 NOTE — ED Notes (Signed)
Patient c/o pain to right ankle. Per patient "gout pain." Patient reports taking Allopurinol.

## 2013-10-10 NOTE — ED Provider Notes (Signed)
Medical screening examination/treatment/procedure(s) were performed by non-physician practitioner and as supervising physician I was immediately available for consultation/collaboration.   EKG Interpretation None       Orlie Dakin, MD 10/10/13 1454

## 2013-10-30 ENCOUNTER — Telehealth: Payer: Self-pay | Admitting: *Deleted

## 2013-10-30 MED ORDER — OXYCODONE-ACETAMINOPHEN 5-325 MG PO TABS: 1.0000 | ORAL_TABLET | Freq: Two times a day (BID) | ORAL | Status: DC | PRN

## 2013-10-30 NOTE — Telephone Encounter (Signed)
Ok to refill??  Last office visit 08/19/2013.  Last refill 10/02/2013.

## 2013-10-30 NOTE — Telephone Encounter (Signed)
Okay to refill? 

## 2013-10-30 NOTE — Telephone Encounter (Signed)
Prescription printed and patient made aware to come to office to pick up.  

## 2013-10-31 ENCOUNTER — Other Ambulatory Visit: Payer: Self-pay | Admitting: *Deleted

## 2013-11-05 ENCOUNTER — Encounter: Payer: Self-pay | Admitting: Family Medicine

## 2013-11-11 ENCOUNTER — Other Ambulatory Visit: Payer: Self-pay | Admitting: *Deleted

## 2013-11-11 MED ORDER — SIMVASTATIN 10 MG PO TABS: 10.0000 mg | ORAL_TABLET | Freq: Every morning | ORAL | Status: DC

## 2013-11-11 MED ORDER — LOSARTAN POTASSIUM 50 MG PO TABS: 50.0000 mg | ORAL_TABLET | Freq: Every day | ORAL | Status: DC

## 2013-11-11 NOTE — Telephone Encounter (Signed)
Refill appropriate and filled per protocol. 

## 2013-11-14 ENCOUNTER — Ambulatory Visit: Payer: Medicare HMO | Admitting: Family Medicine

## 2013-11-21 ENCOUNTER — Ambulatory Visit: Payer: Medicare HMO | Admitting: Family Medicine

## 2013-12-01 ENCOUNTER — Ambulatory Visit (INDEPENDENT_AMBULATORY_CARE_PROVIDER_SITE_OTHER): Payer: Medicare HMO | Admitting: Family Medicine

## 2013-12-01 ENCOUNTER — Encounter: Payer: Self-pay | Admitting: Family Medicine

## 2013-12-01 VITALS — BP 138/82 | HR 98 | Temp 98.5°F | Resp 16 | Ht 63.0 in | Wt 223.0 lb

## 2013-12-01 DIAGNOSIS — M5137 Other intervertebral disc degeneration, lumbosacral region: Secondary | ICD-10-CM

## 2013-12-01 DIAGNOSIS — I10 Essential (primary) hypertension: Secondary | ICD-10-CM

## 2013-12-01 DIAGNOSIS — E1140 Type 2 diabetes mellitus with diabetic neuropathy, unspecified: Secondary | ICD-10-CM

## 2013-12-01 DIAGNOSIS — E785 Hyperlipidemia, unspecified: Secondary | ICD-10-CM

## 2013-12-01 DIAGNOSIS — E1149 Type 2 diabetes mellitus with other diabetic neurological complication: Secondary | ICD-10-CM

## 2013-12-01 DIAGNOSIS — E1142 Type 2 diabetes mellitus with diabetic polyneuropathy: Secondary | ICD-10-CM

## 2013-12-01 DIAGNOSIS — M5136 Other intervertebral disc degeneration, lumbar region: Secondary | ICD-10-CM

## 2013-12-01 MED ORDER — OXYCODONE-ACETAMINOPHEN 5-325 MG PO TABS: 1.0000 | ORAL_TABLET | Freq: Two times a day (BID) | ORAL | Status: DC | PRN

## 2013-12-01 NOTE — Assessment & Plan Note (Signed)
Blood pressure looks okay continue current medication

## 2013-12-01 NOTE — Assessment & Plan Note (Signed)
Pain medication refilled

## 2013-12-01 NOTE — Patient Instructions (Signed)
Continue current medications We will call with the lab results F/U 3 months

## 2013-12-01 NOTE — Assessment & Plan Note (Signed)
I will check her A1c continue Levemir as well as metformin for now. She is on ARB and statin drug

## 2013-12-01 NOTE — Progress Notes (Signed)
Patient ID: ABRISH ERNY, female   DOB: 11/05/1950, 63 y.o.   MRN: [id number]   Subjective:    Patient ID: [name], female    DOB: [birth year], 63 y.o.   MRN: [id number]  Patient presents for not feeling well and medication refill  Patient here to follow chronic medical problems. She has no specific concerns. She was seen by her podiatrist and continued to complain of ankle and knee pain he was concern for rheumatoid arthritis therefore she's been set up with rheumatology in May. She still taking her pain medication as prescribed and requests a refill on these.  Diabetes mellitus states that her sugar has been in the 130s fasting she's not had any hypoglycemia. She's been taking Levemir 20 units most nights. She's also taking her metformin  Occasions were reviewed she states she does not have the gabapentin because it cost too much. She is taking her blood pressure medicine cholesterol medicine as prescribed.   Review Of Systems:  GEN- denies fatigue, fever, weight loss,weakness, recent illness HEENT- denies eye drainage, change in vision, nasal discharge, CVS- denies chest pain, palpitations RESP- denies SOB, cough, wheeze ABD- denies N/V, change in stools, abd pain GU- denies dysuria, hematuria, dribbling, incontinence MSK- + joint pain, muscle aches, injury Neuro- denies headache, dizziness, syncope, seizure activity       Objective:    BP 138/82  Pulse 98  Temp(Src) 98.5 F (36.9 C) (Oral)  Resp 16  Ht 5\' 3"  (1.6 m)  Wt 223 lb (101.152 kg)  BMI 39.51 kg/m2 GEN- NAD, alert and oriented x3 HEENT- PERRL, EOMI, non injected sclera, pink conjunctiva, MMM, oropharynx clear Neck- Supple, no thyromegaly CVS- mild tachycardia, no murmur RESP-CTAB EXT- No edema Pulses- Radial, DP- 2+        Assessment & Plan:      Problem List Items Addressed This Visit   Hyperlipidemia   Relevant Orders      LDL Cholesterol, Direct   Essential hypertension, benign   Diabetic neuropathy   Relevant Medications      insulin detemir (LEVEMIR) 100 UNIT/ML injection   Diabetes with neurologic complications - Primary   Relevant Medications      insulin detemir (LEVEMIR) 100 UNIT/ML injection   Other Relevant Orders      CBC with Differential      Comprehensive metabolic panel      Hemoglobin A1c      Microalbumin / creatinine urine ratio      Note: This dictation was prepared with Dragon dictation along with smaller phrase technology. Any transcriptional errors that result from this process are unintentional.

## 2013-12-01 NOTE — Assessment & Plan Note (Signed)
I will have my nurse check into her gabapentin to see why this as not being covered

## 2013-12-02 LAB — CBC WITH DIFFERENTIAL/PLATELET
BASOS ABS: 0.1 10*3/uL (ref 0.0–0.1)
BASOS PCT: 1 % (ref 0–1)
Eosinophils Absolute: 0.2 10*3/uL (ref 0.0–0.7)
Eosinophils Relative: 3 % (ref 0–5)
HEMATOCRIT: 36 % (ref 36.0–46.0)
Hemoglobin: 12.3 g/dL (ref 12.0–15.0)
LYMPHS PCT: 30 % (ref 12–46)
Lymphs Abs: 1.7 10*3/uL (ref 0.7–4.0)
MCH: 30.1 pg (ref 26.0–34.0)
MCHC: 34.2 g/dL (ref 30.0–36.0)
MCV: 88 fL (ref 78.0–100.0)
MONO ABS: 0.4 10*3/uL (ref 0.1–1.0)
Monocytes Relative: 7 % (ref 3–12)
Neutro Abs: 3.4 10*3/uL (ref 1.7–7.7)
Neutrophils Relative %: 59 % (ref 43–77)
PLATELETS: 354 10*3/uL (ref 150–400)
RBC: 4.09 MIL/uL (ref 3.87–5.11)
RDW: 14.4 % (ref 11.5–15.5)
WBC: 5.7 10*3/uL (ref 4.0–10.5)

## 2013-12-02 LAB — COMPREHENSIVE METABOLIC PANEL
ALK PHOS: 117 U/L (ref 39–117)
ALT: 14 U/L (ref 0–35)
AST: 16 U/L (ref 0–37)
Albumin: 4 g/dL (ref 3.5–5.2)
BUN: 15 mg/dL (ref 6–23)
CALCIUM: 9.3 mg/dL (ref 8.4–10.5)
CHLORIDE: 105 meq/L (ref 96–112)
CO2: 23 mEq/L (ref 19–32)
Creat: 0.84 mg/dL (ref 0.50–1.10)
Glucose, Bld: 204 mg/dL — ABNORMAL HIGH (ref 70–99)
Potassium: 4.5 mEq/L (ref 3.5–5.3)
Sodium: 139 mEq/L (ref 135–145)
Total Bilirubin: 0.3 mg/dL (ref 0.2–1.2)
Total Protein: 6.9 g/dL (ref 6.0–8.3)

## 2013-12-02 LAB — LDL CHOLESTEROL, DIRECT: LDL DIRECT: 68 mg/dL

## 2013-12-02 LAB — MICROALBUMIN / CREATININE URINE RATIO
Creatinine, Urine: 128.9 mg/dL
MICROALB UR: 0.5 mg/dL (ref 0.00–1.89)
MICROALB/CREAT RATIO: 3.9 mg/g (ref 0.0–30.0)

## 2013-12-02 LAB — HEMOGLOBIN A1C
Hgb A1c MFr Bld: 6 % — ABNORMAL HIGH (ref <5.7)
Mean Plasma Glucose: 126 mg/dL — ABNORMAL HIGH (ref <117)

## 2013-12-05 NOTE — Progress Notes (Signed)
LMTRC

## 2013-12-29 ENCOUNTER — Other Ambulatory Visit: Payer: Self-pay | Admitting: Family Medicine

## 2013-12-30 NOTE — Telephone Encounter (Signed)
Refill appropriate and filled per protocol. 

## 2013-12-31 ENCOUNTER — Telehealth: Payer: Self-pay | Admitting: Family Medicine

## 2013-12-31 MED ORDER — OXYCODONE-ACETAMINOPHEN 5-325 MG PO TABS: 1.0000 | ORAL_TABLET | Freq: Two times a day (BID) | ORAL | Status: DC | PRN

## 2013-12-31 NOTE — Telephone Encounter (Signed)
Ok to refill??  Last office visit/ refill 12/01/2013.

## 2013-12-31 NOTE — Telephone Encounter (Signed)
Patient requesting rx for her percocet [phone] number to call when ready

## 2013-12-31 NOTE — Telephone Encounter (Signed)
Okay to refill? 

## 2013-12-31 NOTE — Telephone Encounter (Signed)
Prescription printed and patient made aware to come to office to pick up per VM.  

## 2014-01-01 ENCOUNTER — Emergency Department (HOSPITAL_COMMUNITY)
Admission: EM | Admit: 2014-01-01 | Discharge: 2014-01-01 | Disposition: A | Discharge status: Home / Self Care | Payer: Medicare HMO | Attending: Emergency Medicine | Admitting: Emergency Medicine

## 2014-01-01 ENCOUNTER — Encounter (HOSPITAL_COMMUNITY): Payer: Self-pay | Admitting: Emergency Medicine

## 2014-01-01 DIAGNOSIS — Z862 Personal history of diseases of the blood and blood-forming organs and certain disorders involving the immune mechanism: Secondary | ICD-10-CM | POA: Insufficient documentation

## 2014-01-01 DIAGNOSIS — E119 Type 2 diabetes mellitus without complications: Secondary | ICD-10-CM | POA: Insufficient documentation

## 2014-01-01 DIAGNOSIS — Z79899 Other long term (current) drug therapy: Secondary | ICD-10-CM | POA: Insufficient documentation

## 2014-01-01 DIAGNOSIS — Z794 Long term (current) use of insulin: Secondary | ICD-10-CM | POA: Insufficient documentation

## 2014-01-01 DIAGNOSIS — I2510 Atherosclerotic heart disease of native coronary artery without angina pectoris: Secondary | ICD-10-CM | POA: Insufficient documentation

## 2014-01-01 DIAGNOSIS — Z9861 Coronary angioplasty status: Secondary | ICD-10-CM | POA: Insufficient documentation

## 2014-01-01 DIAGNOSIS — Z880 Allergy status to penicillin: Secondary | ICD-10-CM | POA: Insufficient documentation

## 2014-01-01 DIAGNOSIS — M109 Gout, unspecified: Secondary | ICD-10-CM | POA: Insufficient documentation

## 2014-01-01 DIAGNOSIS — F17200 Nicotine dependence, unspecified, uncomplicated: Secondary | ICD-10-CM | POA: Insufficient documentation

## 2014-01-01 DIAGNOSIS — Z8719 Personal history of other diseases of the digestive system: Secondary | ICD-10-CM | POA: Insufficient documentation

## 2014-01-01 DIAGNOSIS — M1990 Unspecified osteoarthritis, unspecified site: Secondary | ICD-10-CM | POA: Insufficient documentation

## 2014-01-01 DIAGNOSIS — Z791 Long term (current) use of non-steroidal anti-inflammatories (NSAID): Secondary | ICD-10-CM | POA: Insufficient documentation

## 2014-01-01 LAB — CBG MONITORING, ED: GLUCOSE-CAPILLARY: 94 mg/dL (ref 70–99)

## 2014-01-01 MED ORDER — METHYLPREDNISOLONE SODIUM SUCC 125 MG IJ SOLR
125.0000 mg | Freq: Once | INTRAMUSCULAR | Status: AC
  Administered 2014-01-01: 125 mg via INTRAMUSCULAR
  Filled 2014-01-01: qty 2

## 2014-01-01 MED ORDER — PREDNISONE 10 MG PO TABS: 20.0000 mg | ORAL_TABLET | Freq: Every day | ORAL | Status: DC

## 2014-01-01 NOTE — Discharge Instructions (Signed)
Gout °Gout is an inflammatory arthritis caused by a buildup of uric acid crystals in the joints. Uric acid is a chemical that is normally present in the blood. When the level of uric acid in the blood is too high it can form crystals that deposit in your joints and tissues. This causes joint redness, soreness, and swelling (inflammation). Repeat attacks are common. Over time, uric acid crystals can form into masses (tophi) near a joint, destroying bone and causing disfigurement. Gout is treatable and often preventable. °CAUSES  °The disease begins with elevated levels of uric acid in the blood. Uric acid is produced by your body when it breaks down a naturally found substance called purines. Certain foods you eat, such as meats and fish, contain high amounts of purines. Causes of an elevated uric acid level include: °· Being passed down from parent to child (heredity). °· Diseases that cause increased uric acid production (such as obesity, psoriasis, and certain cancers). °· Excessive alcohol use. °· Diet, especially diets rich in meat and seafood. °· Medicines, including certain cancer-fighting medicines (chemotherapy), water pills (diuretics), and aspirin. °· Chronic kidney disease. The kidneys are no longer able to remove uric acid well. °· Problems with metabolism. °Conditions strongly associated with gout include: °· Obesity. °· High blood pressure. °· High cholesterol. °· Diabetes. °Not everyone with elevated uric acid levels gets gout. It is not understood why some people get gout and others do not. Surgery, joint injury, and eating too much of certain foods are some of the factors that can lead to gout attacks. °SYMPTOMS  °· An attack of gout comes on quickly. It causes intense pain with redness, swelling, and warmth in a joint. °· Fever can occur. °· Often, only one joint is involved. Certain joints are more commonly involved: °· Base of the big toe. °· Knee. °· Ankle. °· Wrist. °· Finger. °Without  treatment, an attack usually goes away in a few days to weeks. Between attacks, you usually will not have symptoms, which is different from many other forms of arthritis. °DIAGNOSIS  °Your caregiver will suspect gout based on your symptoms and exam. In some cases, tests may be recommended. The tests may include: °· Blood tests. °· Urine tests. °· X-rays. °· Joint fluid exam. This exam requires a needle to remove fluid from the joint (arthrocentesis). Using a microscope, gout is confirmed when uric acid crystals are seen in the joint fluid. °TREATMENT  °There are two phases to gout treatment: treating the sudden onset (acute) attack and preventing attacks (prophylaxis). °· Treatment of an Acute Attack. °· Medicines are used. These include anti-inflammatory medicines or steroid medicines. °· An injection of steroid medicine into the affected joint is sometimes necessary. °· The painful joint is rested. Movement can worsen the arthritis. °· You may use warm or cold treatments on painful joints, depending which works best for you. °· Treatment to Prevent Attacks. °· If you suffer from frequent gout attacks, your caregiver may advise preventive medicine. These medicines are started after the acute attack subsides. These medicines either help your kidneys eliminate uric acid from your body or decrease your uric acid production. You may need to stay on these medicines for a very long time. °· The early phase of treatment with preventive medicine can be associated with an increase in acute gout attacks. For this reason, during the first few months of treatment, your caregiver may also advise you to take medicines usually used for acute gout treatment. Be sure you   understand your caregiver's directions. Your caregiver may make several adjustments to your medicine dose before these medicines are effective.  Discuss dietary treatment with your caregiver or dietitian. Alcohol and drinks high in sugar and fructose and foods  such as meat, poultry, and seafood can increase uric acid levels. Your caregiver or dietician can advise you on drinks and foods that should be limited. HOME CARE INSTRUCTIONS   Do not take aspirin to relieve pain. This raises uric acid levels.  Only take over-the-counter or prescription medicines for pain, discomfort, or fever as directed by your caregiver.  Rest the joint as much as possible. When in bed, keep sheets and blankets off painful areas.  Keep the affected joint raised (elevated).  Apply warm or cold treatments to painful joints. Use of warm or cold treatments depends on which works best for you.  Use crutches if the painful joint is in your leg.  Drink enough fluids to keep your urine clear or pale yellow. This helps your body get rid of uric acid. Limit alcohol, sugary drinks, and fructose drinks.  Follow your dietary instructions. Pay careful attention to the amount of protein you eat. Your daily diet should emphasize fruits, vegetables, whole grains, and fat-free or low-fat milk products. Discuss the use of coffee, vitamin C, and cherries with your caregiver or dietician. These may be helpful in lowering uric acid levels.  Maintain a healthy body weight. SEEK MEDICAL CARE IF:   You develop diarrhea, vomiting, or any side effects from medicines.  You do not feel better in 24 hours, or you are getting worse. SEEK IMMEDIATE MEDICAL CARE IF:   Your joint becomes suddenly more tender, and you have chills or a fever. MAKE SURE YOU:   Understand these instructions.  Will watch your condition.  Will get help right away if you are not doing well or get worse. Document Released: 07/21/2000 Document Revised: 11/18/2012 Document Reviewed: 03/06/2012 [facility] Patient Information 2014 [place].  Elevate and use warm compresses on your foot and toe as discussed.  Use your home oxycodone for pain relief.  Take your next dose of prednisone tomorrow.  Follow up with your  Dr. if symptoms are not improving.

## 2014-01-01 NOTE — ED Provider Notes (Signed)
CSN: [id number]     Arrival date & time 01/01/14  2706 History   First MD Initiated Contact with Patient 01/01/14 1201     Chief Complaint  Patient presents with  . Gout     (Consider location/radiation/quality/duration/timing/severity/associated sxs/prior Treatment) HPI Comments: Stephanie Hammond is a 63 y.o. Female presenting with a 2 day history of pain,  Swelling and increased warmth at her left proximal great toe consistent with prior episodes of gout. She is on daily allopurinol.  She states that when she has a flare, the only helpful medicine has been a steroid shot.  She denies injury.  She is diabetic,  Reporting her blood glucose levels are well controlled, last checked this morning.  She has taken ibuprofen with no relief of symptoms.       The history is provided by the patient.    Past Medical History  Diagnosis Date  . IBS (irritable bowel syndrome)   . GERD (gastroesophageal reflux disease)   . Anemia   . History of GI diverticular bleed 08/2004  . Diverticulitis   . Diverticulosis   . CAD (coronary artery disease)   . Osteoarthritis   . Diabetes mellitus   . Blood transfusion 2009    after GI bleed  . Gout    Past Surgical History  Procedure Laterality Date  . Ptca    . Vaginal hysterectomy    . Cholecystectomy  2003  . Replacement total knee      right   . Colonoscopy  2000    Dr. Fuller Plan: marked diverticulosis, difficult procedure due to adhesions, polyps benign  . Colonoscopy  2006    Dr. Henrene Pastor: severe pandiverticulosis  . Colonoscopy N/A 02/03/2013    SLF: 4 COLORECTAL POLYPS REMOVED/Moderate diverticulosis throughout the entire examined colon/Small internal hemorrhoids  . Joint replacement     Family History  Problem Relation Age of Onset  . Colon polyps Sister   . Cancer Mother     lung   . Cancer Father     stomach   . Stomach cancer Father     questionable  . Colon cancer Neg Hx    History  Substance Use Topics  . Smoking status:  Current Every Day Smoker -- 1.00 packs/day for 35 years    Types: Cigarettes  . Smokeless tobacco: Never Used  . Alcohol Use: No   OB History   Grav Para Term Preterm Abortions TAB SAB Ect Mult Living   2 2 2       2      Review of Systems  Constitutional: Negative for fever and chills.  Gastrointestinal: Negative for nausea.  Musculoskeletal: Positive for arthralgias and joint swelling. Negative for myalgias.  Neurological: Negative for weakness and numbness.      Allergies  Ace inhibitors; Bentyl; and Penicillins  Home Medications   Prior to Admission medications   Medication Sig Start Date End Date Taking? Authorizing Provider  allopurinol (ZYLOPRIM) 100 MG tablet Take 100 mg by mouth daily.   Yes Historical Provider, MD  insulin detemir (LEVEMIR) 100 UNIT/ML injection Inject 15 Units into the skin at bedtime.    Yes Historical Provider, MD  losartan (COZAAR) 50 MG tablet Take 1 tablet (50 mg total) by mouth daily. 11/11/13  Yes Alycia Rossetti, MD  metFORMIN (GLUCOPHAGE-XR) 500 MG 24 hr tablet TAKE 1 TABLET BY MOUTH EVERY DAY WITH BREAKFAST   Yes Alycia Rossetti, MD  simvastatin (ZOCOR) 10 MG tablet Take 1 tablet (10  mg total) by mouth every morning. 11/11/13  Yes Alycia Rossetti, MD  ibuprofen (ADVIL,MOTRIN) 200 MG tablet Take 400 mg by mouth every 6 (six) hours as needed for moderate pain.    Historical Provider, MD  oxyCODONE-acetaminophen (PERCOCET) 5-325 MG per tablet Take 1 tablet by mouth 2 (two) times daily as needed. 12/31/13   Alycia Rossetti, MD  predniSONE (DELTASONE) 10 MG tablet Take 2 tablets (20 mg total) by mouth daily. 01/02/14   Evalee Jefferson, PA-C   BP 149/87  Pulse 95  Temp(Src) 98.7 F (37.1 C)  Resp 20  Ht 5\' 3"  (1.6 m)  Wt 218 lb (98.884 kg)  BMI 38.63 kg/m2  SpO2 96% Physical Exam  Constitutional: She appears well-developed and well-nourished.  HENT:  Head: Atraumatic.  Neck: Normal range of motion.  Cardiovascular:  Pulses equal bilaterally   Musculoskeletal: She exhibits edema and tenderness.  Mild edema at the left mtp joint of left great toe.  Tender to slight touch.  No red streaking,  Mild erythema localized over the joint. Skin intact.  Dorsalis pedal pulse full.    Neurological: She is alert. She has normal strength. She displays normal reflexes. No sensory deficit.  Skin: Skin is warm and dry.  Psychiatric: She has a normal mood and affect.    ED Course  Procedures (including critical care time) Labs Review Labs Reviewed  CBG MONITORING, ED    Imaging Review No results found.   EKG Interpretation None      MDM   Final diagnoses:  Gout attack    Pt with exam and history c/w gouty flare. She was given a solumedrol injection.  Prescribed prednisone short pulse dosing.  Encouraged elevation, warm compresses. She has oxycodone at home for pain.  Prn f/u with pcp recommended if not improving.  Advised to watch cbg's closely while on prednisone.  Pt understands and agrees with plan.    Evalee Jefferson, PA-C 01/02/14 1012

## 2014-01-01 NOTE — ED Notes (Signed)
Complain of gout in left big toe

## 2014-01-02 NOTE — ED Provider Notes (Signed)
Medical screening examination/treatment/procedure(s) were performed by non-physician practitioner and as supervising physician I was immediately available for consultation/collaboration.   EKG Interpretation None        Vincentina Sollers, MD 01/02/14 1543 

## 2014-01-05 ENCOUNTER — Other Ambulatory Visit: Payer: Self-pay | Admitting: Family Medicine

## 2014-01-05 NOTE — Telephone Encounter (Signed)
Medication refilled per protocol. 

## 2014-01-13 ENCOUNTER — Other Ambulatory Visit: Payer: Self-pay | Admitting: Family Medicine

## 2014-01-28 ENCOUNTER — Other Ambulatory Visit: Payer: Self-pay | Admitting: Family Medicine

## 2014-01-28 ENCOUNTER — Telehealth: Payer: Self-pay | Admitting: Family Medicine

## 2014-01-28 MED ORDER — OXYCODONE-ACETAMINOPHEN 5-325 MG PO TABS: 1.0000 | ORAL_TABLET | Freq: Two times a day (BID) | ORAL | Status: DC | PRN

## 2014-01-28 NOTE — Telephone Encounter (Signed)
RX printed, left up front and patient aware to pick up  

## 2014-01-28 NOTE — Telephone Encounter (Signed)
?   OK to Refill - last refill 12/31/2013

## 2014-01-28 NOTE — Telephone Encounter (Signed)
ok 

## 2014-01-28 NOTE — Telephone Encounter (Signed)
[phone]  PT is needing a refill on her oxyCODONE-acetaminophen (PERCOCET) 5-325 MG per tablet

## 2014-02-03 ENCOUNTER — Telehealth: Payer: Self-pay | Admitting: *Deleted

## 2014-02-03 ENCOUNTER — Other Ambulatory Visit: Payer: Self-pay | Admitting: Gastroenterology

## 2014-02-03 DIAGNOSIS — R1032 Left lower quadrant pain: Secondary | ICD-10-CM

## 2014-02-03 DIAGNOSIS — R1031 Right lower quadrant pain: Secondary | ICD-10-CM

## 2014-02-03 DIAGNOSIS — G8929 Other chronic pain: Secondary | ICD-10-CM

## 2014-02-03 NOTE — Telephone Encounter (Signed)
CT ABD/PEL is scheduled for Wednesday July 1st at 2:30 and she is aware

## 2014-02-03 NOTE — Telephone Encounter (Signed)
She needs a CT abd/pelvis due to persistent pain. When I saw her last year, we proceed with a colonoscopy. Per my plan, consider colonoscopy if persistent pain.  Avoid pepto due to constipation.  Make sure she has no shellfish, seafood, iodine allergies.  Proceed with CT abd/pelvis.

## 2014-02-03 NOTE — Telephone Encounter (Signed)
Pt called stating she is having a lot of trouble with her lower abd her BMs are normal she was taking pepto to help but it started stopping her up, pt said she has lower abd pain. I made pt a appt 03/05/14 with Vicente Males and pt is aware, pt would like to know if there is something she can take until this appointment. Please advise [phone]

## 2014-02-04 ENCOUNTER — Ambulatory Visit (HOSPITAL_COMMUNITY): Payer: Medicare HMO

## 2014-02-04 NOTE — Telephone Encounter (Signed)
I called pt and she does not have any allergies to shellfish, seafood or iodine. ( but she said the CT has been rescheduled to [facility] 02/09/2014. ).

## 2014-02-05 ENCOUNTER — Ambulatory Visit (HOSPITAL_COMMUNITY): Payer: Medicare HMO

## 2014-02-09 ENCOUNTER — Encounter (HOSPITAL_COMMUNITY): Payer: Self-pay

## 2014-02-09 ENCOUNTER — Ambulatory Visit (HOSPITAL_COMMUNITY)
Admission: RE | Admit: 2014-02-09 | Discharge: 2014-02-09 | Disposition: A | Discharge status: Home / Self Care | Payer: Medicare HMO | Source: Ambulatory Visit | Attending: Gastroenterology | Admitting: Gastroenterology

## 2014-02-09 DIAGNOSIS — G8929 Other chronic pain: Secondary | ICD-10-CM

## 2014-02-09 DIAGNOSIS — R1032 Left lower quadrant pain: Secondary | ICD-10-CM

## 2014-02-09 DIAGNOSIS — K5730 Diverticulosis of large intestine without perforation or abscess without bleeding: Secondary | ICD-10-CM | POA: Insufficient documentation

## 2014-02-09 DIAGNOSIS — R1031 Right lower quadrant pain: Secondary | ICD-10-CM | POA: Insufficient documentation

## 2014-02-09 DIAGNOSIS — D739 Disease of spleen, unspecified: Secondary | ICD-10-CM | POA: Insufficient documentation

## 2014-02-09 DIAGNOSIS — K449 Diaphragmatic hernia without obstruction or gangrene: Secondary | ICD-10-CM | POA: Insufficient documentation

## 2014-02-09 LAB — POCT I-STAT CREATININE: CREATININE: 0.9 mg/dL (ref 0.50–1.10)

## 2014-02-09 MED ORDER — IOHEXOL 300 MG/ML  SOLN
100.0000 mL | Freq: Once | INTRAMUSCULAR | Status: AC | PRN
Start: 2014-02-09 — End: 2014-02-09
  Administered 2014-02-09: 100 mL via INTRAVENOUS

## 2014-02-10 ENCOUNTER — Other Ambulatory Visit: Payer: Self-pay | Admitting: Gastroenterology

## 2014-02-10 MED ORDER — CIPROFLOXACIN HCL 500 MG PO TABS: 500.0000 mg | ORAL_TABLET | Freq: Two times a day (BID) | ORAL | Status: DC

## 2014-02-10 MED ORDER — METRONIDAZOLE 500 MG PO TABS: 500.0000 mg | ORAL_TABLET | Freq: Three times a day (TID) | ORAL | Status: DC

## 2014-02-10 NOTE — Progress Notes (Signed)
Quick Note:  Called and informed pt. ______ 

## 2014-02-10 NOTE — Progress Notes (Signed)
Quick Note:  With her abdominal discomfort and CT findings unable to exclude diverticulitis, will provide course of abx.  Will do Cipro and Flagyl. Let's obtain PR on Thursday. ______

## 2014-02-14 ENCOUNTER — Other Ambulatory Visit: Payer: Self-pay | Admitting: *Deleted

## 2014-02-14 DIAGNOSIS — E119 Type 2 diabetes mellitus without complications: Secondary | ICD-10-CM

## 2014-02-14 DIAGNOSIS — E1140 Type 2 diabetes mellitus with diabetic neuropathy, unspecified: Secondary | ICD-10-CM | POA: Insufficient documentation

## 2014-02-14 DIAGNOSIS — I10 Essential (primary) hypertension: Secondary | ICD-10-CM

## 2014-02-14 DIAGNOSIS — E785 Hyperlipidemia, unspecified: Secondary | ICD-10-CM

## 2014-02-27 ENCOUNTER — Telehealth: Payer: Self-pay | Admitting: Family Medicine

## 2014-02-27 MED ORDER — OXYCODONE-ACETAMINOPHEN 5-325 MG PO TABS: 1.0000 | ORAL_TABLET | Freq: Two times a day (BID) | ORAL | Status: DC | PRN

## 2014-02-27 NOTE — Telephone Encounter (Signed)
Patient is requesting refill on percocet said she did not need it until Monday  Please call her when ready at [phone]

## 2014-02-27 NOTE — Telephone Encounter (Signed)
Prescription printed and patient made aware to come to office to pick up.  

## 2014-02-27 NOTE — Telephone Encounter (Signed)
Okay 

## 2014-02-27 NOTE — Telephone Encounter (Signed)
Ok to refill??  Last office visit 12/01/2013.  Last refill 01/28/2014.

## 2014-03-04 NOTE — Telephone Encounter (Signed)
REVIEWED.  

## 2014-03-05 ENCOUNTER — Ambulatory Visit: Payer: Medicare HMO | Admitting: Gastroenterology

## 2014-03-09 ENCOUNTER — Telehealth: Payer: Self-pay | Admitting: Family Medicine

## 2014-03-09 NOTE — Telephone Encounter (Signed)
noted 

## 2014-03-09 NOTE — Telephone Encounter (Signed)
Bonnita Nasuti a nurse from [place] calling to talk with you regarding patients simvastatin  Please call her back at [phone] ext [phone]

## 2014-03-09 NOTE — Telephone Encounter (Signed)
Call placed to [facility] with [place].   Reports that patient has not filled Zocor since 11/2013.  Advised that she has placed calls to patient with no return call.   MD to be made aware.

## 2014-03-19 ENCOUNTER — Other Ambulatory Visit: Payer: Medicare HMO

## 2014-03-23 ENCOUNTER — Other Ambulatory Visit: Payer: Medicare HMO

## 2014-03-23 ENCOUNTER — Ambulatory Visit: Payer: Medicare HMO | Admitting: Family Medicine

## 2014-03-23 ENCOUNTER — Telehealth: Payer: Self-pay | Admitting: Family Medicine

## 2014-03-23 DIAGNOSIS — M109 Gout, unspecified: Secondary | ICD-10-CM

## 2014-03-23 DIAGNOSIS — E119 Type 2 diabetes mellitus without complications: Secondary | ICD-10-CM

## 2014-03-23 DIAGNOSIS — E1149 Type 2 diabetes mellitus with other diabetic neurological complication: Secondary | ICD-10-CM

## 2014-03-23 DIAGNOSIS — E785 Hyperlipidemia, unspecified: Secondary | ICD-10-CM

## 2014-03-23 DIAGNOSIS — I10 Essential (primary) hypertension: Secondary | ICD-10-CM

## 2014-03-23 LAB — BASIC METABOLIC PANEL
BUN: 15 mg/dL (ref 6–23)
CHLORIDE: 106 meq/L (ref 96–112)
CO2: 25 meq/L (ref 19–32)
CREATININE: 0.78 mg/dL (ref 0.50–1.10)
Calcium: 9.2 mg/dL (ref 8.4–10.5)
GLUCOSE: 138 mg/dL — AB (ref 70–99)
Potassium: 4.8 mEq/L (ref 3.5–5.3)
Sodium: 139 mEq/L (ref 135–145)

## 2014-03-23 LAB — LIPID PANEL
CHOL/HDL RATIO: 2.9 ratio
CHOLESTEROL: 111 mg/dL (ref 0–200)
HDL: 38 mg/dL — AB (ref >39)
LDL Cholesterol: 52 mg/dL (ref 0–99)
Triglycerides: 103 mg/dL (ref <150)
VLDL: 21 mg/dL (ref 0–40)

## 2014-03-23 LAB — CBC WITH DIFFERENTIAL/PLATELET
Basophils Absolute: 0.1 10*3/uL (ref 0.0–0.1)
Basophils Relative: 1 % (ref 0–1)
Eosinophils Absolute: 0.1 10*3/uL (ref 0.0–0.7)
Eosinophils Relative: 2 % (ref 0–5)
HEMATOCRIT: 35.1 % — AB (ref 36.0–46.0)
HEMOGLOBIN: 12.2 g/dL (ref 12.0–15.0)
Lymphocytes Relative: 33 % (ref 12–46)
Lymphs Abs: 2 10*3/uL (ref 0.7–4.0)
MCH: 30.7 pg (ref 26.0–34.0)
MCHC: 34.8 g/dL (ref 30.0–36.0)
MCV: 88.4 fL (ref 78.0–100.0)
MONO ABS: 0.4 10*3/uL (ref 0.1–1.0)
MONOS PCT: 7 % (ref 3–12)
NEUTROS ABS: 3.5 10*3/uL (ref 1.7–7.7)
Neutrophils Relative %: 57 % (ref 43–77)
Platelets: 346 10*3/uL (ref 150–400)
RBC: 3.97 MIL/uL (ref 3.87–5.11)
RDW: 14.3 % (ref 11.5–15.5)
WBC: 6.2 10*3/uL (ref 4.0–10.5)

## 2014-03-23 LAB — HEMOGLOBIN A1C
Hgb A1c MFr Bld: 5.9 % — ABNORMAL HIGH (ref <5.7)
Mean Plasma Glucose: 123 mg/dL — ABNORMAL HIGH (ref <117)

## 2014-03-23 LAB — URIC ACID: Uric Acid, Serum: 4.3 mg/dL (ref 2.4–7.0)

## 2014-03-23 NOTE — Telephone Encounter (Signed)
She can not fill until the 22nd

## 2014-03-23 NOTE — Telephone Encounter (Signed)
Ok to refill??  Last office visit 12/01/2013.  Last refill 02/27/2014.

## 2014-03-23 NOTE — Telephone Encounter (Signed)
Patient has appointment on 03/27/2014.  Will print prescription at that time.

## 2014-03-23 NOTE — Telephone Encounter (Signed)
[phone]  Pt has a apt Friday and she would like to pick up her refill on oxyCODONE-acetaminophen (PERCOCET) 5-325 MG per tablet

## 2014-03-25 ENCOUNTER — Ambulatory Visit: Payer: Medicare HMO | Admitting: Family Medicine

## 2014-03-27 ENCOUNTER — Ambulatory Visit (INDEPENDENT_AMBULATORY_CARE_PROVIDER_SITE_OTHER): Payer: Medicare HMO | Admitting: Family Medicine

## 2014-03-27 ENCOUNTER — Encounter: Payer: Self-pay | Admitting: Family Medicine

## 2014-03-27 VITALS — BP 140/64 | HR 90 | Temp 98.4°F | Resp 16 | Ht 62.0 in | Wt 222.0 lb

## 2014-03-27 DIAGNOSIS — E1142 Type 2 diabetes mellitus with diabetic polyneuropathy: Secondary | ICD-10-CM

## 2014-03-27 DIAGNOSIS — G589 Mononeuropathy, unspecified: Secondary | ICD-10-CM

## 2014-03-27 DIAGNOSIS — E785 Hyperlipidemia, unspecified: Secondary | ICD-10-CM

## 2014-03-27 DIAGNOSIS — I10 Essential (primary) hypertension: Secondary | ICD-10-CM

## 2014-03-27 DIAGNOSIS — E1140 Type 2 diabetes mellitus with diabetic neuropathy, unspecified: Secondary | ICD-10-CM

## 2014-03-27 DIAGNOSIS — M5136 Other intervertebral disc degeneration, lumbar region: Secondary | ICD-10-CM

## 2014-03-27 DIAGNOSIS — M5137 Other intervertebral disc degeneration, lumbosacral region: Secondary | ICD-10-CM

## 2014-03-27 DIAGNOSIS — E1141 Type 2 diabetes mellitus with diabetic mononeuropathy: Secondary | ICD-10-CM

## 2014-03-27 DIAGNOSIS — E1149 Type 2 diabetes mellitus with other diabetic neurological complication: Secondary | ICD-10-CM

## 2014-03-27 MED ORDER — LOSARTAN POTASSIUM 100 MG PO TABS: ORAL_TABLET | ORAL | Status: DC

## 2014-03-27 MED ORDER — SIMVASTATIN 10 MG PO TABS: ORAL_TABLET | ORAL | Status: DC

## 2014-03-27 MED ORDER — OXYCODONE-ACETAMINOPHEN 5-325 MG PO TABS: 1.0000 | ORAL_TABLET | Freq: Two times a day (BID) | ORAL | Status: DC | PRN

## 2014-03-27 NOTE — Assessment & Plan Note (Signed)
Back pain controlled with pain medication. Of note the prescription was predicted twice however patient was only given one prescription

## 2014-03-27 NOTE — Assessment & Plan Note (Signed)
With her history of coronary artery disease and diabetes her blood pressure is uncontrolled on increased it was started 100 mg once a day

## 2014-03-27 NOTE — Patient Instructions (Addendum)
Release of records- [facility] in  [facility]) Decrease levemir to 10units Increase losartan to 100mg  once a day  F/U 3 months

## 2014-03-27 NOTE — Addendum Note (Signed)
Addended by: Sheral Flow on: 03/27/2014 02:54 PM   Modules accepted: Orders

## 2014-03-27 NOTE — Progress Notes (Signed)
Patient ID: DEVAN BABINO, female   DOB: 1951-08-05, 63 y.o.   MRN: [id number]   Subjective:    Patient ID: [name], female    DOB: 1951-06-11, 63 y.o.   MRN: [id number]  Patient presents for DM F/U  patient here to follow chronic medical problems. I discussed with her received multiple medications for her insurance company as well as her pharmacy that she was not taken off her medications on a regular basis. She states that she had 90 day supplies of medications and sometimes may be awful week or 2 she is unable to have the money to get them has all of her medicines with the exception of her simvastatin. No specific problems today. One diabetes mellitus her A1c returned at 5.9% she's been taking Levemir 15 units and metformin she's concerned about going off of the Levemir because when she does her blood sugars fluctuate even further. Her blood sugars on her meter today range from 120 to 300s throughout the day. She denies any hypoglycemia symptoms.  Hypertension she does not check her blood pressure on arrival basis she's been taking her losartan as prescribed per report.  She was seen by ophthalmology I do not have the report from this. She requests a refill on her pain medication    Review Of Systems:  GEN- denies fatigue, fever, weight loss,weakness, recent illness HEENT- denies eye drainage, change in vision, nasal discharge, CVS- denies chest pain, palpitations RESP- denies SOB, cough, wheeze ABD- denies N/V, change in stools, abd pain GU- denies dysuria, hematuria, dribbling, incontinence MSK- +joint pain, muscle aches, injury Neuro- denies headache, dizziness, syncope, seizure activity       Objective:    BP 140/64  Pulse 90  Temp(Src) 98.4 F (36.9 C) (Oral)  Resp 16  Ht 5\' 2"  (1.575 m)  Wt 222 lb (100.699 kg)  BMI 40.59 kg/m2 GEN- NAD, alert and oriented x3 HEENT- PERRL, EOMI, non injected sclera, pink conjunctiva, MMM, oropharynx clear Neck- Supple,   CVS- RRR, no murmur RESP-CTAB EXT- No edema Pulses- Radial, DP- 2+        Assessment & Plan:      Problem List Items Addressed This Visit   Diabetes with neurologic complications - Primary   Relevant Medications      losartan (COZAAR) tablet      simvastatin (ZOCOR) tablet      Note: This dictation was prepared with Dragon dictation along with smaller phrase technology. Any transcriptional errors that result from this process are unintentional.

## 2014-03-27 NOTE — Assessment & Plan Note (Signed)
Her cholesterol is at goal. Based on her lab results it appears that she is taking her medication as prescribed

## 2014-03-27 NOTE — Assessment & Plan Note (Signed)
Her diabetes as this is very good control with an A1c of 5.9% however her blood sugars do fluctuate heavily she does not watch what she eats on a regular basis. I will decrease her Levemir 10 units she is very fearful, and off of the Lantus which is what her blood sugars do.

## 2014-04-01 ENCOUNTER — Other Ambulatory Visit: Payer: Self-pay | Admitting: Family Medicine

## 2014-04-01 MED ORDER — INSULIN PEN NEEDLE 31G X 5 MM MISC: Status: DC

## 2014-04-01 NOTE — Telephone Encounter (Signed)
Med sent to pharm and pt aware 

## 2014-04-08 ENCOUNTER — Ambulatory Visit: Payer: Medicare HMO | Admitting: Gastroenterology

## 2014-04-20 ENCOUNTER — Encounter: Payer: Self-pay | Admitting: Gastroenterology

## 2014-05-04 ENCOUNTER — Telehealth: Payer: Self-pay | Admitting: Family Medicine

## 2014-05-04 MED ORDER — OXYCODONE-ACETAMINOPHEN 5-325 MG PO TABS: 1.0000 | ORAL_TABLET | Freq: Two times a day (BID) | ORAL | Status: DC | PRN

## 2014-05-04 NOTE — Telephone Encounter (Signed)
Ok to refill Percocet??  Last office visit/ refill 03/27/2014.

## 2014-05-04 NOTE — Telephone Encounter (Signed)
Prescription printed and patient made aware to come to office to pick up.  

## 2014-05-04 NOTE — Telephone Encounter (Signed)
Patient is calling to get rx for her pain medication  [phone] she said please have this ready for her tomorrow if possible

## 2014-05-04 NOTE — Telephone Encounter (Signed)
ok 

## 2014-05-08 ENCOUNTER — Ambulatory Visit: Payer: Medicare HMO | Admitting: Gastroenterology

## 2014-05-13 ENCOUNTER — Telehealth: Payer: Self-pay | Admitting: Family Medicine

## 2014-05-13 NOTE — Telephone Encounter (Signed)
[facility] Patient needs an rx for blood pressure machine if possible  [employer] told her all we needed to do is fax it over to them

## 2014-05-13 NOTE — Telephone Encounter (Signed)
Prescription sent to pharmacy.

## 2014-05-13 NOTE — Telephone Encounter (Signed)
OK to send order

## 2014-05-13 NOTE — Telephone Encounter (Signed)
Okay to send 

## 2014-05-14 ENCOUNTER — Ambulatory Visit: Payer: Medicare HMO | Admitting: Gastroenterology

## 2014-05-20 ENCOUNTER — Telehealth: Payer: Self-pay | Admitting: Family Medicine

## 2014-05-20 NOTE — Telephone Encounter (Signed)
Patient is calling to talk with you regarding her high blood pressure readings please call her back at [phone]

## 2014-05-20 NOTE — Telephone Encounter (Signed)
Call placed to patient. LMTRC.  

## 2014-05-21 NOTE — Telephone Encounter (Signed)
Call placed to patient.   States that her BP was elevated on 05/20/2014. States that her initial reading was 190/101, and after her medications it was 180/115.  Reports that her readings today were much better at 175/91, and 125/87 after her medications.   Advised to continue to monitor and contact our office if they elevate again.

## 2014-06-01 ENCOUNTER — Other Ambulatory Visit (HOSPITAL_COMMUNITY): Payer: Self-pay | Admitting: Internal Medicine

## 2014-06-01 ENCOUNTER — Other Ambulatory Visit: Payer: Self-pay | Admitting: Family Medicine

## 2014-06-01 DIAGNOSIS — N644 Mastodynia: Secondary | ICD-10-CM

## 2014-06-03 ENCOUNTER — Telehealth: Payer: Self-pay | Admitting: Family Medicine

## 2014-06-03 MED ORDER — OXYCODONE-ACETAMINOPHEN 5-325 MG PO TABS: 1.0000 | ORAL_TABLET | Freq: Two times a day (BID) | ORAL | Status: DC | PRN

## 2014-06-03 NOTE — Telephone Encounter (Signed)
Ok to refill??  Last office visit 03/27/2014.  Last refill 05/04/2014.

## 2014-06-03 NOTE — Telephone Encounter (Signed)
Okay to refill? 

## 2014-06-03 NOTE — Telephone Encounter (Signed)
Prescription printed and patient made aware to come to office to pick up per VM.  

## 2014-06-03 NOTE — Telephone Encounter (Signed)
Patient calling for refill on her percocet  [phone]

## 2014-06-05 ENCOUNTER — Ambulatory Visit: Payer: Medicare HMO | Admitting: Gastroenterology

## 2014-06-08 ENCOUNTER — Encounter: Payer: Self-pay | Admitting: Family Medicine

## 2014-06-09 ENCOUNTER — Encounter: Payer: Self-pay | Admitting: Gastroenterology

## 2014-06-10 ENCOUNTER — Ambulatory Visit: Payer: Medicare HMO | Admitting: Family Medicine

## 2014-06-11 ENCOUNTER — Other Ambulatory Visit: Payer: Self-pay | Admitting: Family Medicine

## 2014-06-11 NOTE — Telephone Encounter (Signed)
Refill appropriate and filled per protocol. 

## 2014-06-14 ENCOUNTER — Encounter (HOSPITAL_COMMUNITY): Payer: Self-pay | Admitting: Emergency Medicine

## 2014-06-14 ENCOUNTER — Emergency Department (HOSPITAL_COMMUNITY)
Admission: EM | Admit: 2014-06-14 | Discharge: 2014-06-14 | Disposition: A | Discharge status: Home / Self Care | Payer: Medicare HMO | Attending: Emergency Medicine | Admitting: Emergency Medicine

## 2014-06-14 ENCOUNTER — Emergency Department (HOSPITAL_COMMUNITY): Payer: Medicare HMO

## 2014-06-14 DIAGNOSIS — M1990 Unspecified osteoarthritis, unspecified site: Secondary | ICD-10-CM | POA: Diagnosis not present

## 2014-06-14 DIAGNOSIS — Z862 Personal history of diseases of the blood and blood-forming organs and certain disorders involving the immune mechanism: Secondary | ICD-10-CM | POA: Diagnosis not present

## 2014-06-14 DIAGNOSIS — M109 Gout, unspecified: Secondary | ICD-10-CM | POA: Insufficient documentation

## 2014-06-14 DIAGNOSIS — Z720 Tobacco use: Secondary | ICD-10-CM | POA: Insufficient documentation

## 2014-06-14 DIAGNOSIS — I2510 Atherosclerotic heart disease of native coronary artery without angina pectoris: Secondary | ICD-10-CM | POA: Insufficient documentation

## 2014-06-14 DIAGNOSIS — E119 Type 2 diabetes mellitus without complications: Secondary | ICD-10-CM | POA: Insufficient documentation

## 2014-06-14 DIAGNOSIS — K589 Irritable bowel syndrome without diarrhea: Secondary | ICD-10-CM

## 2014-06-14 DIAGNOSIS — R1030 Lower abdominal pain, unspecified: Secondary | ICD-10-CM | POA: Diagnosis present

## 2014-06-14 DIAGNOSIS — K580 Irritable bowel syndrome with diarrhea: Secondary | ICD-10-CM | POA: Diagnosis not present

## 2014-06-14 DIAGNOSIS — K219 Gastro-esophageal reflux disease without esophagitis: Secondary | ICD-10-CM | POA: Insufficient documentation

## 2014-06-14 DIAGNOSIS — Z794 Long term (current) use of insulin: Secondary | ICD-10-CM | POA: Insufficient documentation

## 2014-06-14 DIAGNOSIS — Z9889 Other specified postprocedural states: Secondary | ICD-10-CM | POA: Insufficient documentation

## 2014-06-14 DIAGNOSIS — Z79899 Other long term (current) drug therapy: Secondary | ICD-10-CM | POA: Insufficient documentation

## 2014-06-14 DIAGNOSIS — Z9049 Acquired absence of other specified parts of digestive tract: Secondary | ICD-10-CM | POA: Diagnosis not present

## 2014-06-14 DIAGNOSIS — Z880 Allergy status to penicillin: Secondary | ICD-10-CM | POA: Insufficient documentation

## 2014-06-14 DIAGNOSIS — R109 Unspecified abdominal pain: Secondary | ICD-10-CM

## 2014-06-14 DIAGNOSIS — Z90710 Acquired absence of both cervix and uterus: Secondary | ICD-10-CM | POA: Diagnosis not present

## 2014-06-14 LAB — CBC WITH DIFFERENTIAL/PLATELET
BASOS ABS: 0.1 10*3/uL (ref 0.0–0.1)
BASOS PCT: 1 % (ref 0–1)
EOS ABS: 0.2 10*3/uL (ref 0.0–0.7)
EOS PCT: 3 % (ref 0–5)
HEMATOCRIT: 36.4 % (ref 36.0–46.0)
Hemoglobin: 13.3 g/dL (ref 12.0–15.0)
Lymphocytes Relative: 28 % (ref 12–46)
Lymphs Abs: 1.8 10*3/uL (ref 0.7–4.0)
MCH: 31.8 pg (ref 26.0–34.0)
MCHC: 36.5 g/dL — AB (ref 30.0–36.0)
MCV: 87.1 fL (ref 78.0–100.0)
MONO ABS: 0.4 10*3/uL (ref 0.1–1.0)
Monocytes Relative: 7 % (ref 3–12)
Neutro Abs: 4 10*3/uL (ref 1.7–7.7)
Neutrophils Relative %: 61 % (ref 43–77)
Platelets: 347 10*3/uL (ref 150–400)
RBC: 4.18 MIL/uL (ref 3.87–5.11)
RDW: 12.5 % (ref 11.5–15.5)
WBC: 6.5 10*3/uL (ref 4.0–10.5)

## 2014-06-14 LAB — URINALYSIS, ROUTINE W REFLEX MICROSCOPIC
Bilirubin Urine: NEGATIVE
Glucose, UA: NEGATIVE mg/dL
Hgb urine dipstick: NEGATIVE
Ketones, ur: NEGATIVE mg/dL
LEUKOCYTES UA: NEGATIVE
Nitrite: NEGATIVE
PROTEIN: NEGATIVE mg/dL
Specific Gravity, Urine: 1.01 (ref 1.005–1.030)
UROBILINOGEN UA: 0.2 mg/dL (ref 0.0–1.0)
pH: 5.5 (ref 5.0–8.0)

## 2014-06-14 LAB — BASIC METABOLIC PANEL
ANION GAP: 13 (ref 5–15)
BUN: 14 mg/dL (ref 6–23)
CALCIUM: 9.1 mg/dL (ref 8.4–10.5)
CO2: 22 mEq/L (ref 19–32)
CREATININE: 0.81 mg/dL (ref 0.50–1.10)
Chloride: 102 mEq/L (ref 96–112)
GFR, EST AFRICAN AMERICAN: 89 mL/min — AB (ref >90)
GFR, EST NON AFRICAN AMERICAN: 77 mL/min — AB (ref >90)
Glucose, Bld: 162 mg/dL — ABNORMAL HIGH (ref 70–99)
Potassium: 4.1 mEq/L (ref 3.7–5.3)
Sodium: 137 mEq/L (ref 137–147)

## 2014-06-14 MED ORDER — DIPHENOXYLATE-ATROPINE 2.5-0.025 MG PO TABS: 1.0000 | ORAL_TABLET | Freq: Four times a day (QID) | ORAL | Status: DC | PRN

## 2014-06-14 MED ORDER — ONDANSETRON 4 MG PO TBDP: 4.0000 mg | ORAL_TABLET | Freq: Three times a day (TID) | ORAL | Status: DC | PRN

## 2014-06-14 MED ORDER — ONDANSETRON HCL 4 MG/2ML IJ SOLN
4.0000 mg | Freq: Once | INTRAMUSCULAR | Status: AC
  Administered 2014-06-14: 4 mg via INTRAVENOUS
  Filled 2014-06-14: qty 2

## 2014-06-14 MED ORDER — HYDROCODONE-ACETAMINOPHEN 5-325 MG PO TABS: 1.0000 | ORAL_TABLET | ORAL | Status: DC | PRN

## 2014-06-14 MED ORDER — MORPHINE SULFATE 4 MG/ML IJ SOLN
4.0000 mg | INTRAMUSCULAR | Status: DC | PRN
  Administered 2014-06-14: 4 mg via INTRAVENOUS
  Filled 2014-06-14: qty 1

## 2014-06-14 NOTE — ED Notes (Signed)
Patient c/o intermittent diarrhea with lower and pain x2 weeks. Patient does report vomiting x1 on Friday. Denies any nausea or fevers. Per patient occasional pain with urination. Patient reports that she does see a gastroenterologist-Dr Fields and reports a family hx of Chron's disease

## 2014-06-14 NOTE — ED Provider Notes (Signed)
CSN: [id number]     Arrival date & time 06/14/14  1002 History  This chart was scribed for Tanna Furry, MD by Martinique Peace, ED Scribe. The patient was seen in [facility]. The patient's care was started at 10:30 AM.    Chief Complaint  Patient presents with  . Abdominal Pain      The history is provided by the patient. No language interpreter was used.    HPI Comments: Stephanie Hammond is a 63 y.o. female who presents to the Emergency Department complaining of non-radiating lower abdominal pain with associated diarrhea for 2 weeks and one episode of vomiting on Friday. She states whatever she eats, goes right through her. Pt reports she has been told she has diaverticulitis and irritable bowel syndrome. No complaints blood in stools, fever, or urinary issues. Pt has history of gout. She denies any changes in medications, changes in diet, or traveling out of the country. Allergic to penicillin.    Past Medical History  Diagnosis Date  . IBS (irritable bowel syndrome)   . GERD (gastroesophageal reflux disease)   . Anemia   . History of GI diverticular bleed 08/2004  . Diverticulitis   . Diverticulosis   . CAD (coronary artery disease)   . Osteoarthritis   . Diabetes mellitus   . Blood transfusion 2009    after GI bleed  . Gout    Past Surgical History  Procedure Laterality Date  . Ptca    . Vaginal hysterectomy    . Cholecystectomy  2003  . Replacement total knee      right   . Colonoscopy  2000    Dr. Fuller Plan: marked diverticulosis, difficult procedure due to adhesions, polyps benign  . Colonoscopy  2006    Dr. Henrene Pastor: severe pandiverticulosis  . Colonoscopy N/A 02/03/2013    SLF: 4 COLORECTAL POLYPS REMOVED/Moderate diverticulosis throughout the entire examined colon/Small internal hemorrhoids  . Joint replacement     Family History  Problem Relation Age of Onset  . Colon polyps Sister   . Cancer Mother     lung   . Cancer Father     stomach   . Stomach cancer Father      questionable  . Colon cancer Neg Hx    History  Substance Use Topics  . Smoking status: Current Every Day Smoker -- 1.00 packs/day for 35 years    Types: Cigarettes  . Smokeless tobacco: Never Used  . Alcohol Use: No   OB History    Gravida Para Term Preterm AB TAB SAB Ectopic Multiple Living   2 2 2       2      Review of Systems  Constitutional: Negative for fever, chills, diaphoresis, appetite change and fatigue.  HENT: Negative for mouth sores, sore throat and trouble swallowing.   Eyes: Negative for visual disturbance.  Respiratory: Negative for cough, chest tightness, shortness of breath and wheezing.   Cardiovascular: Negative for chest pain.  Gastrointestinal: Positive for vomiting, abdominal pain and diarrhea. Negative for nausea, blood in stool and abdominal distention.  Endocrine: Negative for polydipsia, polyphagia and polyuria.  Genitourinary: Negative for dysuria, frequency and hematuria.  Musculoskeletal: Negative for gait problem.  Skin: Negative for color change, pallor and rash.  Neurological: Negative for dizziness, syncope, light-headedness and headaches.  Hematological: Does not bruise/bleed easily.  Psychiatric/Behavioral: Negative for behavioral problems and confusion.      Allergies  Ace inhibitors; Bentyl; and Penicillins  Home Medications   Prior to Admission  medications   Medication Sig Start Date End Date Taking? Authorizing Provider  allopurinol (ZYLOPRIM) 300 MG tablet Take 300 mg by mouth daily.  05/21/14  Yes Historical Provider, MD  insulin detemir (LEVEMIR) 100 UNIT/ML injection Inject 10 Units into the skin at bedtime.    Yes Historical Provider, MD  losartan (COZAAR) 100 MG tablet Take 100 mg by mouth daily.   Yes Historical Provider, MD  losartan (COZAAR) 100 MG tablet Take 100 mg by mouth daily.   Yes Historical Provider, MD  metFORMIN (GLUCOPHAGE-XR) 500 MG 24 hr tablet Take 500 mg by mouth daily with breakfast.   Yes Historical  Provider, MD  omeprazole (PRILOSEC) 20 MG capsule Take 20 mg by mouth daily.   Yes Historical Provider, MD  oxyCODONE-acetaminophen (PERCOCET/ROXICET) 5-325 MG per tablet Take 1 tablet by mouth 2 (two) times daily as needed (pain).   Yes Historical Provider, MD  simvastatin (ZOCOR) 10 MG tablet Take 10 mg by mouth daily.   Yes Historical Provider, MD  diphenoxylate-atropine (LOMOTIL) 2.5-0.025 MG per tablet Take 1 tablet by mouth 4 (four) times daily as needed for diarrhea or loose stools. 06/14/14   Tanna Furry, MD  HYDROcodone-acetaminophen (NORCO/VICODIN) 5-325 MG per tablet Take 1 tablet by mouth every 4 (four) hours as needed. 06/14/14   Tanna Furry, MD  ibuprofen (ADVIL,MOTRIN) 200 MG tablet Take 400 mg by mouth every 6 (six) hours as needed for moderate pain.    Historical Provider, MD  Insulin Pen Needle 31G X 5 MM MISC Use as Directed QD for insulin injections - DX- 250.00 Patient not taking: Reported on 06/14/2014 04/01/14   Alycia Rossetti, MD  losartan (COZAAR) 100 MG tablet TAKE 1 TABLET BY MOUTH EVERY DAY Patient not taking: Reported on 06/14/2014 03/27/14   Alycia Rossetti, MD  metFORMIN (GLUCOPHAGE-XR) 500 MG 24 hr tablet TAKE 1 TABLET BY MOUTH EVERY DAY WITH BREAKFAST Patient not taking: Reported on 06/14/2014    Alycia Rossetti, MD  ondansetron (ZOFRAN ODT) 4 MG disintegrating tablet Take 1 tablet (4 mg total) by mouth every 8 (eight) hours as needed for nausea. 06/14/14   Tanna Furry, MD  ONE TOUCH ULTRA TEST test strip USE TO CHECK BLOOD GLUCOSE THREE TIMES A DAY AS INSTRUCTED Patient not taking: Reported on 06/14/2014 06/11/14   Alycia Rossetti, MD  oxyCODONE-acetaminophen (PERCOCET) 5-325 MG per tablet Take 1 tablet by mouth 2 (two) times daily as needed. Patient not taking: Reported on 06/14/2014 06/03/14   Alycia Rossetti, MD  simvastatin (ZOCOR) 10 MG tablet TAKE 1 TABLET BY MOUTH EVERY MORNING Patient not taking: Reported on 06/14/2014 03/27/14   Alycia Rossetti, MD   BP 157/80  mmHg  Pulse 93  Temp(Src) 97.8 F (36.6 C) (Oral)  Resp 16  Ht 5\' 3"  (1.6 m)  Wt 218 lb (98.884 kg)  BMI 38.63 kg/m2  SpO2 99% Physical Exam  Constitutional: She is oriented to person, place, and time. She appears well-developed and well-nourished. No distress.  HENT:  Head: Normocephalic.  Eyes: Conjunctivae are normal. Pupils are equal, round, and reactive to light. No scleral icterus.  Neck: Normal range of motion. Neck supple. No thyromegaly present.  Cardiovascular: Normal rate and regular rhythm.  Exam reveals no gallop and no friction rub.   No murmur heard. Pulmonary/Chest: Effort normal and breath sounds normal. No respiratory distress. She has no wheezes. She has no rales.  Abdominal: Soft. Bowel sounds are normal. She exhibits no distension. There is tenderness.  There is no rebound.  Tenderness to suprapubic region. Well healed hysterectomy scar. No obvious ventral hernia.   Musculoskeletal: Normal range of motion.  Neurological: She is alert and oriented to person, place, and time.  Skin: Skin is warm and dry. No rash noted.  Psychiatric: She has a normal mood and affect. Her behavior is normal.    ED Course  Procedures (including critical care time) Labs Review Labs Reviewed  CBC WITH DIFFERENTIAL - Abnormal; Notable for the following:    MCHC 36.5 (*)    All other components within normal limits  BASIC METABOLIC PANEL - Abnormal; Notable for the following:    Glucose, Bld 162 (*)    GFR calc non Af Amer 77 (*)    GFR calc Af Amer 89 (*)    All other components within normal limits  URINE CULTURE  URINALYSIS, ROUTINE W REFLEX MICROSCOPIC    Imaging Review Ct Abdomen Pelvis Wo Contrast  06/14/2014   CLINICAL DATA:  63 year old female with 2 week history of lower abdominal pain and diarrhea  EXAM: CT ABDOMEN AND PELVIS WITHOUT CONTRAST  TECHNIQUE: Multidetector CT imaging of the abdomen and pelvis was performed following the standard protocol without IV  contrast.  COMPARISON:  Prior CT abdomen/pelvis 02/09/2014  FINDINGS: Lower Chest: Small 3 mm pulmonary nodule in the periphery of the left lower lobe is unchanged compared to the most recent CT scan dated 02/09/2014. Correlation with prior CT imaging was not helpful as the region was not covered on the prior imaging. Calcified left infrahilar and periesophageal and gastrohepatic ligament adenopathy suggests old granulomatous disease.  Abdomen: Unremarkable CT appearance of the stomach, duodenum, spleen, adrenal glands and pancreas. Normal hepatic contour and morphology. No discrete hepatic lesion. The gallbladder is surgically absent. No intra or extrahepatic biliary ductal dilatation. Unremarkable appearance of the bilateral kidneys. No focal solid lesion, hydronephrosis or nephrolithiasis.  Extensive colonic diverticulosis. No definite active inflammation. No focal bowel wall thickening or evidence of obstruction.  Pelvis: Surgical changes of prior hysterectomy. The bladder is unremarkable. Pelvic floor laxity.  Bones/Soft Tissues: No acute fracture or aggressive appearing lytic or blastic osseous lesion. Right L5-S1 facet arthropathy.  Vascular: Limited evaluation in the absence of intravenous contrast. Scattered mild atherosclerotic vascular calcifications without aneurysmal dilatation.  IMPRESSION: 1. No acute abnormality in the abdomen or pelvis. 2. Extensive colonic diverticulosis without evidence of active diverticulitis. 3. Incidental note is made of a small 3 mm nodule in the periphery of the left lower lobe which is unchanged dating back to July of 2015. If the patient is at high risk for bronchogenic carcinoma, follow-up chest CT at 1 year is recommended. If the patient is at low risk, no follow-up is needed. This recommendation follows the consensus statement: Guidelines for Management of Small Pulmonary Nodules Detected on CT Scans: A Statement from the Fleischner Society as published in Radiology  2005; 237:395-400. 4. Sequelae of old granulomatous disease involving upper abdominal, paraesophageal and left infrahilar nodal stations. 5. Pelvic floor laxity. 6. Right L5-S1 facet arthropathy. 7. Mild atherosclerotic vascular calcifications.   Electronically Signed   By: Jacqulynn Cadet M.D.   On: 06/14/2014 12:18     EKG Interpretation None     Medications  morphine 4 MG/ML injection 4 mg (4 mg Intravenous Given 06/14/14 1057)  ondansetron (ZOFRAN) injection 4 mg (4 mg Intravenous Given 06/14/14 1057)    10:35 AM- Treatment plan was discussed with patient who verbalizes understanding and agrees.   MDM  Final diagnoses:  Abdominal pain  Irritable bowel syndrome    Findings on ct. Think she is appropriate for outpatient treatment for probable exacerbation of chronic pain, irritable bowel syndrome. Multiple diverticuli, however no diverticulitis, and no white blood cell count elevation.  I personally performed the services described in this documentation, which was scribed in my presence. The recorded information has been reviewed and is accurate.    Tanna Furry, MD 06/14/14 [phone]

## 2014-06-16 ENCOUNTER — Encounter (HOSPITAL_COMMUNITY): Payer: Medicare HMO

## 2014-06-16 LAB — URINE CULTURE

## 2014-06-22 ENCOUNTER — Telehealth: Payer: Self-pay | Admitting: Gastroenterology

## 2014-06-22 ENCOUNTER — Telehealth: Payer: Self-pay

## 2014-06-22 NOTE — Telephone Encounter (Signed)
Patient called back again saying she missed a call from [place]. I told her that DS was at lunch and will have to call her back. [phone]

## 2014-06-22 NOTE — Telephone Encounter (Signed)
PT is aware and is scheduled for urgent ov with Neil Crouch, PA on 06/25/2014.

## 2014-06-22 NOTE — Telephone Encounter (Signed)
LMOM to call.

## 2014-06-22 NOTE — Telephone Encounter (Addendum)
Reviewed records.  CT A/P WITHOUT contrast on 06/14/14. No evidence of diverticulitis. WBC 6500.  Last seen here over one year ago.  We can offer her an urgent spot. But no recommendations without being seen first.

## 2014-06-22 NOTE — Telephone Encounter (Signed)
I spoke to pt and she said she is having a lot of abdominal pain and was told she had a flare of diverticulitis at the ED. They gave her Vicodin and antidiarrhea medication but no antibiotics. I offered her an appt at 2:00 pm on Wed 11/18 ( we had a cancellation). She declined and said her brother in law died and she would have to go to the funeral. Then she said the only time she could come would be Friday and I told her we do not have appt for then.  I told her I would let Neil Crouch, PA advise!

## 2014-06-22 NOTE — Telephone Encounter (Signed)
Patient is scheduled for OV on 12/3 and she is having a flare up with her diverticulitis and wants to know if she can be seen sooner.  She has been to her PCP and to the ED and is not getting any relief. Please advise. [phone]

## 2014-06-22 NOTE — Telephone Encounter (Signed)
See previous note of 06/22/2014.

## 2014-06-22 NOTE — Telephone Encounter (Signed)
I cancelled the appt on 07/09/2014.

## 2014-06-24 ENCOUNTER — Telehealth: Payer: Self-pay | Admitting: Family Medicine

## 2014-06-24 NOTE — Telephone Encounter (Signed)
Call placed to patient. LMTRC.  

## 2014-06-24 NOTE — Telephone Encounter (Signed)
[facility] [address] [phone] Patient is calling to see if dr Buelah Manis could call her something in for bladder infection

## 2014-06-24 NOTE — Telephone Encounter (Signed)
Call placed to patient.   Reports that she has discomfort to vaginal area and urinary frequency.   Denies urinary pain, discharge, foul smelling urine, or discolored urine.   Patient has appointment scheduled for Monday, 06/30/2014.  MD please advise.

## 2014-06-24 NOTE — Telephone Encounter (Signed)
Needs appt to be seen, she can take AZO if she cant come in now

## 2014-06-25 ENCOUNTER — Telehealth: Payer: Self-pay | Admitting: Gastroenterology

## 2014-06-25 ENCOUNTER — Encounter (HOSPITAL_COMMUNITY): Payer: Self-pay | Admitting: Emergency Medicine

## 2014-06-25 ENCOUNTER — Ambulatory Visit (INDEPENDENT_AMBULATORY_CARE_PROVIDER_SITE_OTHER): Payer: Medicare HMO | Admitting: Gastroenterology

## 2014-06-25 ENCOUNTER — Emergency Department (HOSPITAL_COMMUNITY)
Admission: EM | Admit: 2014-06-25 | Discharge: 2014-06-25 | Disposition: A | Discharge status: Home / Self Care | Payer: Medicare HMO | Attending: Emergency Medicine | Admitting: Emergency Medicine

## 2014-06-25 ENCOUNTER — Encounter: Payer: Self-pay | Admitting: Gastroenterology

## 2014-06-25 VITALS — BP 151/82 | HR 104 | Temp 98.2°F | Ht 62.5 in | Wt 220.0 lb

## 2014-06-25 DIAGNOSIS — Z720 Tobacco use: Secondary | ICD-10-CM | POA: Diagnosis not present

## 2014-06-25 DIAGNOSIS — Z90710 Acquired absence of both cervix and uterus: Secondary | ICD-10-CM | POA: Insufficient documentation

## 2014-06-25 DIAGNOSIS — E119 Type 2 diabetes mellitus without complications: Secondary | ICD-10-CM | POA: Diagnosis not present

## 2014-06-25 DIAGNOSIS — M25551 Pain in right hip: Secondary | ICD-10-CM

## 2014-06-25 DIAGNOSIS — M109 Gout, unspecified: Secondary | ICD-10-CM | POA: Diagnosis not present

## 2014-06-25 DIAGNOSIS — Z79899 Other long term (current) drug therapy: Secondary | ICD-10-CM | POA: Insufficient documentation

## 2014-06-25 DIAGNOSIS — Z880 Allergy status to penicillin: Secondary | ICD-10-CM | POA: Insufficient documentation

## 2014-06-25 DIAGNOSIS — K219 Gastro-esophageal reflux disease without esophagitis: Secondary | ICD-10-CM | POA: Insufficient documentation

## 2014-06-25 DIAGNOSIS — Z862 Personal history of diseases of the blood and blood-forming organs and certain disorders involving the immune mechanism: Secondary | ICD-10-CM | POA: Insufficient documentation

## 2014-06-25 DIAGNOSIS — Z794 Long term (current) use of insulin: Secondary | ICD-10-CM | POA: Diagnosis not present

## 2014-06-25 DIAGNOSIS — Z9889 Other specified postprocedural states: Secondary | ICD-10-CM | POA: Diagnosis not present

## 2014-06-25 DIAGNOSIS — Z9089 Acquired absence of other organs: Secondary | ICD-10-CM | POA: Insufficient documentation

## 2014-06-25 DIAGNOSIS — M25559 Pain in unspecified hip: Secondary | ICD-10-CM | POA: Insufficient documentation

## 2014-06-25 DIAGNOSIS — R1031 Right lower quadrant pain: Secondary | ICD-10-CM | POA: Diagnosis present

## 2014-06-25 DIAGNOSIS — I2510 Atherosclerotic heart disease of native coronary artery without angina pectoris: Secondary | ICD-10-CM | POA: Insufficient documentation

## 2014-06-25 DIAGNOSIS — M1990 Unspecified osteoarthritis, unspecified site: Secondary | ICD-10-CM | POA: Insufficient documentation

## 2014-06-25 MED ORDER — PROMETHAZINE HCL 25 MG/ML IJ SOLN
12.5000 mg | Freq: Once | INTRAMUSCULAR | Status: AC
Start: 2014-06-25 — End: 2014-06-25
  Administered 2014-06-25: 12.5 mg via INTRAMUSCULAR

## 2014-06-25 MED ORDER — DEXAMETHASONE SODIUM PHOSPHATE 4 MG/ML IJ SOLN: 10.0000 mg | Freq: Once | INTRAMUSCULAR | Status: DC

## 2014-06-25 MED ORDER — PROMETHAZINE HCL 25 MG/ML IJ SOLN
12.5000 mg | Freq: Once | INTRAMUSCULAR | Status: DC
  Filled 2014-06-25: qty 1

## 2014-06-25 MED ORDER — HYDROMORPHONE HCL 1 MG/ML IJ SOLN
1.0000 mg | Freq: Once | INTRAMUSCULAR | Status: AC
  Administered 2014-06-25: 1 mg via INTRAMUSCULAR
  Filled 2014-06-25: qty 1

## 2014-06-25 NOTE — Assessment & Plan Note (Addendum)
63 year old female with 3 week history of acute onset pain in the pelvic joint area. Aggravated by ambulation. Unrelated to bowel function. CT scan without evidence of diverticulitis or other intra-abdominal normality. I spoke with Dr. Laurence Ferrari, radiologist, who reviewed her CT scan from November 8 and the one in July. There is no change in her pelvic bone area. No evidence of fracture. I discussed the case with Dr. Oneida Alar who also agreed that patient's pain unlikely to be GI in origin. Recommendations were to consider MRI of the pelvic area versus bone scan. Patient has an appointment tomorrow to see her PCP, Dr. Buelah Manis. . She recommended Patient has appointment tomorrow with her PCP. Patient related that she needed immediate relief of her pain and had plans to go to the emergency department. Spoke with the triage nurse and advised of recent workup and current recommendations. Patient was offered a ride to the emergency department versus calling her spouse versus calling 911 but she refused and opted to drive herself to the ER.

## 2014-06-25 NOTE — Telephone Encounter (Signed)
Pt called today asking to be seen earlier today than having to wait until 230. Patient has no showed several times and actually today's OV was moved up from [place] 3 to today. Patient said that she would try to be here this afternoon.

## 2014-06-25 NOTE — Discharge Instructions (Signed)
Your exam today shows that you are having pain in the right inguinal area. We are giving you an injection for pain. Follow up with your doctor tomorrow as scheduled for further evaluation.

## 2014-06-25 NOTE — ED Provider Notes (Signed)
Medical screening examination/treatment/procedure(s) were conducted as a shared visit with non-physician practitioner(s) and myself.  I personally evaluated the patient during the encounter.   EKG Interpretation None      Patient with right groin pain with no history of injury for 3 weeks. Patient had a CT scan of the abdomen and pelvis that was done on November 8 that was negative. Also showed no bony injuries or problems with the hips. Patient will need pain control. Patient has follow-up with her primary care doctor tomorrow. Orthopedic follow-up may be appropriate to further evaluate. CT scan rules out any intra-abdominal process any hernias. And in review with the radiologist no evidence of any bony problems. Patient has good range of motion of the right leg. The pain is in the right groin area.  Fredia Sorrow, MD 06/25/14 1728

## 2014-06-25 NOTE — ED Notes (Signed)
Pt c/o rt groin pain for approximately 3 weeks. Was seen at MDs office today for pain medicine but they couldn't give her any.

## 2014-06-25 NOTE — Patient Instructions (Signed)
1. Please follow up with Dr. Buelah Manis tomorrow. Your symptoms are not likely GI related. I am concerned you may have a bone/tendon/muscle issue. You may ask Dr. Buelah Manis to consider a MRI of the pelvis or bone scan.  2. You should take ibuprofen 600mg  three times per day for inflammation for the next 5 days. Take your percocet as needed.

## 2014-06-25 NOTE — Progress Notes (Signed)
Primary Care Physician: Vic Blackbird, MD  Primary Gastroenterologist:  Barney Drain, MD   Chief Complaint  Patient presents with  . Abdominal Pain  . Diverticulitis    HPI: Stephanie Hammond is a 63 y.o. female here for urgent office visit regarding abdominal pain. She thinks she may have diverticulitis. Last treated for diverticulitis in July based on CT findings. She also has a history of IBS. Last seen, colonoscopy in June 2014. She had diverticulosis, multiple colonic polyps (tubular adenomas). Next colonoscopy planned for 5 year follow-up.  Patient states she began having symptoms 3 weeks ago. Symptoms started acutely without any type of known injury. Pain in the suprapubic region/right groin. Feels like a hot poker. Rates her pain currently as a 50 on a scale of 1-10. Can't walk due to pain. Noted difficulty walking in her office. She had a hard time getting up on the exam table. Symptom has been persistent without any relief. She has tried Advil, Aleve, oxycodone which she has for chronic intermittent back pain. When symptoms first started she did have some postprandial loose stools but this resolved. She is having 1-2 formed stools daily. Denies melena, rectal bleeding. Denies any dysuria. No heartburn. Her pain is brought on by ambulation.  She was seen in emergency department on 06/14/2014. She had a CT of the abdomen and pelvis without contrast without any acute findings. She did have an incidentally noted small 3 mm nodule in the periphery of the left lower lobe stable since July 2015. Further evaluation recommended if at increased risk for bronchogenic carcinoma, patient is a smoker. Her labs are unremarkable.  Current Outpatient Prescriptions  Medication Sig Dispense Refill  . allopurinol (ZYLOPRIM) 300 MG tablet Take 300 mg by mouth daily.   2  . ibuprofen (ADVIL,MOTRIN) 200 MG tablet Take 400 mg by mouth every 6 (six) hours as needed for moderate pain.    [name] insulin  detemir (LEVEMIR) 100 UNIT/ML injection Inject 10 Units into the skin at bedtime.     . Insulin Pen Needle 31G X 5 MM MISC Use as Directed QD for insulin injections - DX- 250.00 100 each 2  . losartan (COZAAR) 100 MG tablet TAKE 1 TABLET BY MOUTH EVERY DAY 30 tablet 6  . metFORMIN (GLUCOPHAGE-XR) 500 MG 24 hr tablet TAKE 1 TABLET BY MOUTH EVERY DAY WITH BREAKFAST 90 tablet 3  . omeprazole (PRILOSEC) 20 MG capsule Take 20 mg by mouth daily.    . ondansetron (ZOFRAN ODT) 4 MG disintegrating tablet Take 1 tablet (4 mg total) by mouth every 8 (eight) hours as needed for nausea. 20 tablet 0  . ONE TOUCH ULTRA TEST test strip USE TO CHECK BLOOD GLUCOSE THREE TIMES A DAY AS INSTRUCTED 100 each 5  . oxyCODONE-acetaminophen (PERCOCET) 5-325 MG per tablet Take 1 tablet by mouth 2 (two) times daily as needed. 60 tablet 0  . simvastatin (ZOCOR) 10 MG tablet TAKE 1 TABLET BY MOUTH EVERY MORNING 90 tablet 2   No current facility-administered medications for this visit.    Allergies as of 06/25/2014 - Review Complete 06/25/2014  Allergen Reaction Noted  . Ace inhibitors Cough 08/15/2012  . Bentyl [dicyclomine hcl] Other (See Comments) 02/03/2013  . Penicillins Itching and Swelling 03/28/2011   Past Medical History  Diagnosis Date  . IBS (irritable bowel syndrome)   . GERD (gastroesophageal reflux disease)   . Anemia   . History of GI diverticular bleed 08/2004  . Diverticulitis   .  Diverticulosis   . CAD (coronary artery disease)   . Osteoarthritis   . Diabetes mellitus   . Blood transfusion 2009    after GI bleed  . Gout    Past Surgical History  Procedure Laterality Date  . Ptca    . Vaginal hysterectomy    . Cholecystectomy  2003  . Replacement total knee      right   . Colonoscopy  2000    Dr. Fuller Plan: marked diverticulosis, difficult procedure due to adhesions, polyps benign  . Colonoscopy  2006    Dr. Henrene Pastor: severe pandiverticulosis  . Colonoscopy N/A 02/03/2013    SLF: 4 COLORECTAL  POLYPS REMOVED/Moderate diverticulosis throughout the entire examined colon/Small internal hemorrhoids  . Joint replacement      ROS:  General: Negative for anorexia, weight loss, fever, chills, fatigue, weakness. ENT: Negative for hoarseness, difficulty swallowing , nasal congestion. CV: Negative for chest pain, angina, palpitations, dyspnea on exertion, peripheral edema.  Respiratory: Negative for dyspnea at rest, dyspnea on exertion, cough, sputum, wheezing.  GI: See history of present illness. GU:  Negative for dysuria, hematuria, urinary incontinence, urinary frequency, nocturnal urination.  Endo: Negative for unusual weight change.    Physical Examination:   BP 151/82 mmHg  Pulse 104  Temp(Src) 98.2 F (36.8 C) (Oral)  Ht 5' 2.5" (1.588 m)  Wt 220 lb (99.791 kg)  BMI 39.57 kg/m2  General: Well-nourished, well-developed in no acute distress. Difficulty ambulating across the room. Patient appeared uncomfortable. Eyes: No icterus. Mouth: Oropharyngeal mucosa moist and pink , no lesions erythema or exudate. Lungs: Clear to auscultation bilaterally.  Heart: Regular rate and rhythm, no murmurs rubs or gallops.  Abdomen: Bowel sounds are normal, nontender, nondistended, no hepatosplenomegaly or masses, no abdominal bruits or hernia , no rebound or guarding.  Pain noted with palpation of the suprapubic area, right groin. No abdominal tenderness noted. Extremities: No lower extremity edema. No clubbing or deformities. Neuro: Alert and oriented x 4   Skin: Warm and dry, no jaundice.   Psych: Alert and cooperative, normal mood and affect.  Labs:  Lab Results  Component Value Date   WBC 6.5 06/14/2014   HGB 13.3 06/14/2014   HCT 36.4 06/14/2014   MCV 87.1 06/14/2014   PLT 347 06/14/2014   Lab Results  Component Value Date   CREATININE 0.81 06/14/2014   BUN 14 06/14/2014   NA 137 06/14/2014   K 4.1 06/14/2014   CL 102 06/14/2014   CO2 22 06/14/2014    Imaging  Studies: Ct Abdomen Pelvis Wo Contrast  06/14/2014   CLINICAL DATA:  63 year old female with 2 week history of lower abdominal pain and diarrhea  EXAM: CT ABDOMEN AND PELVIS WITHOUT CONTRAST  TECHNIQUE: Multidetector CT imaging of the abdomen and pelvis was performed following the standard protocol without IV contrast.  COMPARISON:  Prior CT abdomen/pelvis 02/09/2014  FINDINGS: Lower Chest: Small 3 mm pulmonary nodule in the periphery of the left lower lobe is unchanged compared to the most recent CT scan dated 02/09/2014. Correlation with prior CT imaging was not helpful as the region was not covered on the prior imaging. Calcified left infrahilar and periesophageal and gastrohepatic ligament adenopathy suggests old granulomatous disease.  Abdomen: Unremarkable CT appearance of the stomach, duodenum, spleen, adrenal glands and pancreas. Normal hepatic contour and morphology. No discrete hepatic lesion. The gallbladder is surgically absent. No intra or extrahepatic biliary ductal dilatation. Unremarkable appearance of the bilateral kidneys. No focal solid lesion, hydronephrosis or nephrolithiasis.  Extensive colonic diverticulosis. No definite active inflammation. No focal bowel wall thickening or evidence of obstruction.  Pelvis: Surgical changes of prior hysterectomy. The bladder is unremarkable. Pelvic floor laxity.  Bones/Soft Tissues: No acute fracture or aggressive appearing lytic or blastic osseous lesion. Right L5-S1 facet arthropathy.  Vascular: Limited evaluation in the absence of intravenous contrast. Scattered mild atherosclerotic vascular calcifications without aneurysmal dilatation.  IMPRESSION: 1. No acute abnormality in the abdomen or pelvis. 2. Extensive colonic diverticulosis without evidence of active diverticulitis. 3. Incidental note is made of a small 3 mm nodule in the periphery of the left lower lobe which is unchanged dating back to July of 2015. If the patient is at high risk for  bronchogenic carcinoma, follow-up chest CT at 1 year is recommended. If the patient is at low risk, no follow-up is needed. This recommendation follows the consensus statement: Guidelines for Management of Small Pulmonary Nodules Detected on CT Scans: A Statement from the Fleischner Society as published in Radiology 2005; 237:395-400. 4. Sequelae of old granulomatous disease involving upper abdominal, paraesophageal and left infrahilar nodal stations. 5. Pelvic floor laxity. 6. Right L5-S1 facet arthropathy. 7. Mild atherosclerotic vascular calcifications.   Electronically Signed   By: Jacqulynn Cadet M.D.   On: 06/14/2014 12:18

## 2014-06-25 NOTE — Progress Notes (Signed)
cc'ed to pcp °

## 2014-06-25 NOTE — ED Provider Notes (Signed)
CSN: [id number]     Arrival date & time 06/25/14  1551 History   First MD Initiated Contact with Patient 06/25/14 1651     Chief Complaint  Patient presents with  . Groin Pain     (Consider location/radiation/quality/duration/timing/severity/associated sxs/prior Treatment) Patient is a 63 y.o. female presenting with groin pain. The history is provided by the patient.  Groin Pain This is a new problem. The current episode started 1 to 4 weeks ago. The problem occurs constantly. The problem has been gradually worsening. Pertinent negatives include no abdominal pain, chest pain, chills, coughing, fever, headaches, nausea, rash or vomiting. The symptoms are aggravated by walking and bending. The treatment provided no relief.   Stephanie Hammond is a 63 y.o. female who presents to the ED with right groin pain that started 3 weeks ago. The pain is brought on by ambulation. She was evaluated by her GI doctor today to rule out GI problems. She had a CT on Nov. 8th that showed no diverticulitis or other findings. The doctor spoke with the radiologist today to discuss pelvic bone and hip bone views on the last CT and the radiologist sees noting abnormal on the CT. It was recommended to the patient that she keep her appointment with her doctor tomorrow and discuss possible MR to assess for ligament injury or other problems since the GI work up was negative. The patient told the doctor that she was going to come to the ED for pain medication. The patient took Percocet yesterday without relief.   Past Medical History  Diagnosis Date  . IBS (irritable bowel syndrome)   . GERD (gastroesophageal reflux disease)   . Anemia   . History of GI diverticular bleed 08/2004  . Diverticulitis   . Diverticulosis   . CAD (coronary artery disease)   . Osteoarthritis   . Diabetes mellitus   . Blood transfusion 2009    after GI bleed  . Gout    Past Surgical History  Procedure Laterality Date  . Ptca    .  Vaginal hysterectomy    . Cholecystectomy  2003  . Replacement total knee      right   . Colonoscopy  2000    Dr. Fuller Plan: marked diverticulosis, difficult procedure due to adhesions, polyps benign  . Colonoscopy  2006    Dr. Henrene Pastor: severe pandiverticulosis  . Colonoscopy N/A 02/03/2013    SLF: 4 COLORECTAL POLYPS REMOVED/Moderate diverticulosis throughout the entire examined colon/Small internal hemorrhoids  . Joint replacement     Family History  Problem Relation Age of Onset  . Colon polyps Sister   . Cancer Mother     lung   . Cancer Father     stomach   . Stomach cancer Father     questionable  . Colon cancer Neg Hx    History  Substance Use Topics  . Smoking status: Current Every Day Smoker -- 1.00 packs/day for 35 years    Types: Cigarettes  . Smokeless tobacco: Never Used  . Alcohol Use: No   OB History    Gravida Para Term Preterm AB TAB SAB Ectopic Multiple Living   2 2 2       2      Review of Systems  Constitutional: Negative for fever and chills.  Eyes: Negative for pain and visual disturbance.  Respiratory: Negative for cough, choking and shortness of breath.   Cardiovascular: Negative for chest pain.  Gastrointestinal: Negative for nausea, vomiting and abdominal pain.  Genitourinary: Negative for dysuria, urgency and frequency.  Musculoskeletal: Negative for back pain.       Right inguinal pain  Skin: Negative for rash.  Neurological: Negative for dizziness and headaches.  Psychiatric/Behavioral: Negative for confusion. The patient is not nervous/anxious.       Allergies  Ace inhibitors; Bentyl; and Penicillins  Home Medications   Prior to Admission medications   Medication Sig Start Date End Date Taking? Authorizing Provider  allopurinol (ZYLOPRIM) 300 MG tablet Take 300 mg by mouth daily.  05/21/14   Historical Provider, MD  ibuprofen (ADVIL,MOTRIN) 200 MG tablet Take 400 mg by mouth every 6 (six) hours as needed for moderate pain.    Historical  Provider, MD  insulin detemir (LEVEMIR) 100 UNIT/ML injection Inject 10 Units into the skin at bedtime.     Historical Provider, MD  Insulin Pen Needle 31G X 5 MM MISC Use as Directed QD for insulin injections - DX- 250.00 04/01/14   Alycia Rossetti, MD  losartan (COZAAR) 100 MG tablet TAKE 1 TABLET BY MOUTH EVERY DAY 03/27/14   Alycia Rossetti, MD  metFORMIN (GLUCOPHAGE-XR) 500 MG 24 hr tablet TAKE 1 TABLET BY MOUTH EVERY DAY WITH BREAKFAST    Alycia Rossetti, MD  omeprazole (PRILOSEC) 20 MG capsule Take 20 mg by mouth daily.    Historical Provider, MD  ondansetron (ZOFRAN ODT) 4 MG disintegrating tablet Take 1 tablet (4 mg total) by mouth every 8 (eight) hours as needed for nausea. 06/14/14   Tanna Furry, MD  ONE TOUCH ULTRA TEST test strip USE TO CHECK BLOOD GLUCOSE THREE TIMES A DAY AS INSTRUCTED 06/11/14   Alycia Rossetti, MD  oxyCODONE-acetaminophen (PERCOCET) 5-325 MG per tablet Take 1 tablet by mouth 2 (two) times daily as needed. 06/03/14   Alycia Rossetti, MD  simvastatin (ZOCOR) 10 MG tablet TAKE 1 TABLET BY MOUTH EVERY MORNING 03/27/14   Alycia Rossetti, MD   BP 165/80 mmHg  Pulse 96  Temp(Src) 98.3 F (36.8 C) (Oral)  Resp 18  Ht 5\' 3"  (1.6 m)  Wt 218 lb (98.884 kg)  BMI 38.63 kg/m2  SpO2 99% Physical Exam  Constitutional: She is oriented to person, place, and time. She appears well-developed and well-nourished. No distress.  HENT:  Head: Normocephalic.  Eyes: EOM are normal.  Neck: Neck supple.  Cardiovascular: Normal rate.   Pulmonary/Chest: Effort normal.  Abdominal: Soft. Bowel sounds are normal. There is tenderness.    Tender with palpation and range of motion right inguinal area. No hernia palpated. Increased pain with range of motion of the hip.   Musculoskeletal: Normal range of motion.  Neurological: She is alert and oriented to person, place, and time. No cranial nerve deficit.  Skin: Skin is warm and dry.  Psychiatric: She has a normal mood and affect. Her  behavior is normal.  Nursing note and vitals reviewed.   ED Course  Procedures (including critical care time) Labs Review I discussed this case with Dr. Rogene Houston and he did see the patient as well  MDM  63 y.o. female with right inguinal pain with ambulation and range of motion of the right hip. Will treat pain here in the ED and patient will keep her appointment for follow up with her PCP tomorrow. She will return as needed for worsening symptoms. Stable for discharge with decreased pain after medications. Ambulatory at discharge.      [place], [place] 06/26/14 [phone]

## 2014-06-25 NOTE — Telephone Encounter (Signed)
Patient returned call and made aware.   Appointment scheduled for Friday.

## 2014-06-25 NOTE — ED Notes (Signed)
Pt aware that she can not drive home after medication administration. Patient has called her husband who is on the way to pick her up, states she will wait for him in the waiting room.

## 2014-06-25 NOTE — Telephone Encounter (Signed)
noted 

## 2014-06-26 ENCOUNTER — Encounter: Payer: Self-pay | Admitting: Family Medicine

## 2014-06-26 ENCOUNTER — Ambulatory Visit (INDEPENDENT_AMBULATORY_CARE_PROVIDER_SITE_OTHER): Payer: Medicare HMO | Admitting: Family Medicine

## 2014-06-26 VITALS — BP 122/68 | HR 84 | Temp 98.6°F | Resp 16 | Ht 63.0 in | Wt 216.0 lb

## 2014-06-26 DIAGNOSIS — K5732 Diverticulitis of large intestine without perforation or abscess without bleeding: Secondary | ICD-10-CM

## 2014-06-26 DIAGNOSIS — R1031 Right lower quadrant pain: Secondary | ICD-10-CM

## 2014-06-26 DIAGNOSIS — R309 Painful micturition, unspecified: Secondary | ICD-10-CM

## 2014-06-26 LAB — URINALYSIS, ROUTINE W REFLEX MICROSCOPIC
Glucose, UA: NEGATIVE mg/dL
Hgb urine dipstick: NEGATIVE
Leukocytes, UA: NEGATIVE
Nitrite: NEGATIVE
Protein, ur: NEGATIVE mg/dL
SPECIFIC GRAVITY, URINE: 1.025 (ref 1.005–1.030)
UROBILINOGEN UA: 0.2 mg/dL (ref 0.0–1.0)
pH: 6 (ref 5.0–8.0)

## 2014-06-26 MED ORDER — OXYCODONE-ACETAMINOPHEN 7.5-325 MG PO TABS: 1.0000 | ORAL_TABLET | Freq: Two times a day (BID) | ORAL | Status: DC | PRN

## 2014-06-26 MED ORDER — CIPROFLOXACIN HCL 500 MG PO TABS: 500.0000 mg | ORAL_TABLET | Freq: Two times a day (BID) | ORAL | Status: DC

## 2014-06-26 MED ORDER — METRONIDAZOLE 500 MG PO TABS: 500.0000 mg | ORAL_TABLET | Freq: Two times a day (BID) | ORAL | Status: DC

## 2014-06-26 NOTE — Patient Instructions (Signed)
Take antibiotics as prescribed Get xray of hip F/U as previous

## 2014-06-26 NOTE — Progress Notes (Signed)
Patient ID: Stephanie Hammond, female   DOB: Jul 10, 1951, 63 y.o.   MRN: [id number]   Subjective:    Patient ID: Stephanie Hammond, female    DOB: Aug 31, 1950, 63 y.o.   MRN: [id number]  Patient presents for Painful Urination patient here with ongoing right lower quadrant pain as well as right groin pain. She was seen in the ER on November after he goes similar symptoms the pain is very severe and she can't walk. She had a CT scan done which showed diverticulosis but no diverticulitis as well as her arthritis and back other lesions. A urine culture was done which was normal. She then went to her gastroenterologist couple days ago and she continues pain she's also had diarrhea on and off. Did not think it was GI in origin as she does have underlying syndrome. She was sent to the emergency room because of difficulty walking she was evaluated and given a shot probably will. He has been taking ibuprofen as well as her Percocet with minimal relief. She denies any change in her urine she occasionally has some pressure but no blood in urine.   No injuries, denies falls, no change in back pain Review Of Systems:  GEN- denies fatigue, fever, weight loss,weakness, recent illness HEENT- denies eye drainage, change in vision, nasal discharge, CVS- denies chest pain, palpitations RESP- denies SOB, cough, wheeze ABD- denies N/V, change in stools,+ abd pain GU- denies dysuria, hematuria, dribbling, incontinence MSK- +joint pain, muscle aches, injury Neuro- denies headache, dizziness, syncope, seizure activity       Objective:    BP 122/68 mmHg  Pulse 84  Temp(Src) 98.6 F (37 C) (Oral)  Resp 16  Ht 5\' 3"  (1.6 m)  Wt 216 lb (97.977 kg)  BMI 38.27 kg/m2 GEN- NAD, alert and oriented x3 HEENT- PERRL, EOMI, non injected sclera, pink conjunctiva, MMM, oropharynx clear Neck- Supple, no LAD CVS- RRR, no murmur RESP-CTAB ABD-NABS,soft,TTP RLQ, no rebound, no guarding, no masses,  MSK- SPine NT, TTP right  groin, no hernia no masses, no rash, pain with ROM Right HIP- IR, antalgic gait, strength decreased RLE compared to left EXT- No edema Pulses- Radial 2+        Assessment & Plan:      Problem List Items Addressed This Visit    None    Visit Diagnoses    Pain with urination    -  Primary    Relevant Orders       Urinalysis, Routine w reflex microscopic (Completed)    Diverticulitis of large intestine without perforation or abscess without bleeding        Will treat for possible diverticulitis with the chagne in stools, she is having some financial problems, advised to get Cipro at least, pain meds also increased    Relevant Medications       ciprofloxacin (CIPRO) tablet       metroNIDAZOLE (FLAGYL) tablet       PERCOCET 7.5-325 MG PO TABS    Groin pain, right        possible related to Hip pathology, no injury, vs above for abd pain, CT neg, no appendicitis    Relevant Orders       DG Hip Bilateral W/Pelvis       Note: This dictation was prepared with Dragon dictation along with smaller phrase technology. Any transcriptional errors that result from this process are unintentional.

## 2014-06-29 ENCOUNTER — Ambulatory Visit: Payer: Medicare HMO | Admitting: Family Medicine

## 2014-07-01 NOTE — Progress Notes (Signed)
REVIEWED. AGREE. NO ADDITIONAL RECOMMENDATIONS. 

## 2014-07-01 NOTE — Telephone Encounter (Signed)
REVIEWED.  

## 2014-07-07 ENCOUNTER — Encounter (HOSPITAL_COMMUNITY): Payer: Medicare HMO

## 2014-07-09 ENCOUNTER — Ambulatory Visit: Payer: Medicare HMO | Admitting: Gastroenterology

## 2014-07-14 ENCOUNTER — Other Ambulatory Visit: Payer: Self-pay | Admitting: Family Medicine

## 2014-07-14 DIAGNOSIS — Z1231 Encounter for screening mammogram for malignant neoplasm of breast: Secondary | ICD-10-CM

## 2014-07-21 ENCOUNTER — Encounter (HOSPITAL_COMMUNITY): Payer: Medicare HMO

## 2014-07-21 ENCOUNTER — Telehealth: Payer: Self-pay | Admitting: Family Medicine

## 2014-07-21 MED ORDER — OXYCODONE-ACETAMINOPHEN 7.5-325 MG PO TABS: 1.0000 | ORAL_TABLET | Freq: Two times a day (BID) | ORAL | Status: DC | PRN

## 2014-07-21 MED ORDER — ALLOPURINOL 300 MG PO TABS: 300.0000 mg | ORAL_TABLET | Freq: Every day | ORAL | Status: DC

## 2014-07-21 NOTE — Telephone Encounter (Signed)
Patient is calling to get refill on her percocet and her allopurinol  Please call her at  [phone]

## 2014-07-21 NOTE — Telephone Encounter (Signed)
Okay to refill? 

## 2014-07-21 NOTE — Telephone Encounter (Signed)
Ok to refill Percocet??  Last office visit/ refill 06/26/2014.

## 2014-07-21 NOTE — Telephone Encounter (Signed)
Prescription printed and patient made aware to come to office to pick up.  

## 2014-07-22 ENCOUNTER — Ambulatory Visit (HOSPITAL_COMMUNITY): Payer: Medicare HMO

## 2014-07-28 ENCOUNTER — Encounter: Payer: Self-pay | Admitting: *Deleted

## 2014-08-14 ENCOUNTER — Ambulatory Visit (HOSPITAL_COMMUNITY)
Admission: RE | Admit: 2014-08-14 | Discharge: 2014-08-14 | Disposition: A | Discharge status: Home / Self Care | Payer: Medicare HMO | Source: Ambulatory Visit | Attending: Family Medicine | Admitting: Family Medicine

## 2014-08-14 ENCOUNTER — Other Ambulatory Visit: Payer: Self-pay | Admitting: Family Medicine

## 2014-08-14 DIAGNOSIS — Z1231 Encounter for screening mammogram for malignant neoplasm of breast: Secondary | ICD-10-CM | POA: Diagnosis present

## 2014-08-14 DIAGNOSIS — M25551 Pain in right hip: Secondary | ICD-10-CM | POA: Diagnosis not present

## 2014-08-14 DIAGNOSIS — R1031 Right lower quadrant pain: Secondary | ICD-10-CM

## 2014-08-26 ENCOUNTER — Telehealth: Payer: Self-pay | Admitting: Family Medicine

## 2014-08-26 NOTE — Telephone Encounter (Signed)
Since prescription co-pay cost is being used to go towards deductible, changing prescription will not help.   Call placed to patient to advise. Stephanie Hammond.

## 2014-08-26 NOTE — Telephone Encounter (Signed)
Her LOV and last Labs regarding Diabetes was 03/23/2014 ([facility]) Have pt schedule OV with Dr. Buelah Manis.

## 2014-08-26 NOTE — Telephone Encounter (Signed)
Patient returned call and made aware.   Patient states that she can not afford to pay for insulin.   No other insulin noted on lower tier in formulary.   Please advise.

## 2014-08-26 NOTE — Telephone Encounter (Signed)
Patient is having to pay in full for her insulin which is over 200.00 because of deductible, would like to know if there is anything we can do different for her insulin  [phone]

## 2014-08-26 NOTE — Telephone Encounter (Signed)
Call placed to patient and patient made aware.   Appointment scheduled for 09/04/2014.  MD to be made aware.

## 2014-08-31 NOTE — Telephone Encounter (Signed)
Noted last A1C 5.9%, due for fasting labs anyway, will see at [facility] on Friday

## 2014-09-04 ENCOUNTER — Ambulatory Visit: Payer: Self-pay | Admitting: Family Medicine

## 2014-09-07 ENCOUNTER — Encounter: Payer: Self-pay | Admitting: Family Medicine

## 2014-09-07 ENCOUNTER — Ambulatory Visit (INDEPENDENT_AMBULATORY_CARE_PROVIDER_SITE_OTHER): Payer: Medicare HMO | Admitting: Family Medicine

## 2014-09-07 VITALS — BP 132/74 | HR 88 | Temp 98.4°F | Resp 16 | Ht 63.0 in | Wt 213.0 lb

## 2014-09-07 DIAGNOSIS — E1141 Type 2 diabetes mellitus with diabetic mononeuropathy: Secondary | ICD-10-CM

## 2014-09-07 DIAGNOSIS — M109 Gout, unspecified: Secondary | ICD-10-CM | POA: Insufficient documentation

## 2014-09-07 DIAGNOSIS — E785 Hyperlipidemia, unspecified: Secondary | ICD-10-CM

## 2014-09-07 DIAGNOSIS — I10 Essential (primary) hypertension: Secondary | ICD-10-CM

## 2014-09-07 DIAGNOSIS — M5136 Other intervertebral disc degeneration, lumbar region: Secondary | ICD-10-CM

## 2014-09-07 DIAGNOSIS — M1000 Idiopathic gout, unspecified site: Secondary | ICD-10-CM

## 2014-09-07 DIAGNOSIS — E1140 Type 2 diabetes mellitus with diabetic neuropathy, unspecified: Secondary | ICD-10-CM

## 2014-09-07 DIAGNOSIS — M5410 Radiculopathy, site unspecified: Secondary | ICD-10-CM

## 2014-09-07 LAB — CBC WITH DIFFERENTIAL/PLATELET
BASOS ABS: 0.1 10*3/uL (ref 0.0–0.1)
BASOS PCT: 1 % (ref 0–1)
EOS ABS: 0.1 10*3/uL (ref 0.0–0.7)
EOS PCT: 2 % (ref 0–5)
HEMATOCRIT: 37.1 % (ref 36.0–46.0)
Hemoglobin: 12.7 g/dL (ref 12.0–15.0)
LYMPHS ABS: 1.9 10*3/uL (ref 0.7–4.0)
Lymphocytes Relative: 30 % (ref 12–46)
MCH: 31 pg (ref 26.0–34.0)
MCHC: 34.2 g/dL (ref 30.0–36.0)
MCV: 90.5 fL (ref 78.0–100.0)
MONO ABS: 0.4 10*3/uL (ref 0.1–1.0)
MPV: 9.6 fL (ref 8.6–12.4)
Monocytes Relative: 6 % (ref 3–12)
NEUTROS PCT: 61 % (ref 43–77)
Neutro Abs: 3.8 10*3/uL (ref 1.7–7.7)
Platelets: 377 10*3/uL (ref 150–400)
RBC: 4.1 MIL/uL (ref 3.87–5.11)
RDW: 13.9 % (ref 11.5–15.5)
WBC: 6.3 10*3/uL (ref 4.0–10.5)

## 2014-09-07 LAB — LIPID PANEL
CHOLESTEROL: 113 mg/dL (ref 0–200)
HDL: 38 mg/dL — ABNORMAL LOW (ref >39)
LDL CALC: 53 mg/dL (ref 0–99)
TRIGLYCERIDES: 110 mg/dL (ref <150)
Total CHOL/HDL Ratio: 3 Ratio
VLDL: 22 mg/dL (ref 0–40)

## 2014-09-07 LAB — COMPREHENSIVE METABOLIC PANEL
ALBUMIN: 4.1 g/dL (ref 3.5–5.2)
ALT: 13 U/L (ref 0–35)
AST: 12 U/L (ref 0–37)
Alkaline Phosphatase: 123 U/L — ABNORMAL HIGH (ref 39–117)
BUN: 14 mg/dL (ref 6–23)
CO2: 24 mEq/L (ref 19–32)
CREATININE: 0.8 mg/dL (ref 0.50–1.10)
Calcium: 9.5 mg/dL (ref 8.4–10.5)
Chloride: 105 mEq/L (ref 96–112)
Glucose, Bld: 142 mg/dL — ABNORMAL HIGH (ref 70–99)
Potassium: 4.6 mEq/L (ref 3.5–5.3)
Sodium: 138 mEq/L (ref 135–145)
TOTAL PROTEIN: 6.9 g/dL (ref 6.0–8.3)
Total Bilirubin: 0.4 mg/dL (ref 0.2–1.2)

## 2014-09-07 LAB — HEMOGLOBIN A1C, FINGERSTICK: HEMOGLOBIN A1C, FINGERSTICK: 6.7 % — AB (ref <5.7)

## 2014-09-07 MED ORDER — OXYCODONE-ACETAMINOPHEN 7.5-325 MG PO TABS: 1.0000 | ORAL_TABLET | Freq: Two times a day (BID) | ORAL | Status: DC | PRN

## 2014-09-07 MED ORDER — PREGABALIN 75 MG PO CAPS: 75.0000 mg | ORAL_CAPSULE | Freq: Two times a day (BID) | ORAL | Status: DC

## 2014-09-07 NOTE — Progress Notes (Signed)
Patient ID: Stephanie Hammond, female   DOB: 1951/06/20, 64 y.o.   MRN: [id number]   Subjective:    Patient ID: Stephanie Hammond, female    DOB: 04-20-1951, 64 y.o.   MRN: [id number]  Patient presents for Medication Review  Patient here for medication review. Fourthly she is unable to afford her insulin. She did try taking 2 tablets of metformin which will be 1000 mg however this caused significant diarrhea. She did not bring her meter with her today therefore I do not know what her sugars have been running. She is due for fasting labs for her chronic medical problem including cholesterol and renal function. He agrees to go to diabetes education to also assist her with her weight loss  She has history of gout had a flare over the weekend in her toe she is out of her allopurinol  He continues to have worsening back pain is mostly on the right side with radiates into the buttocks however she gets sharp pain radiating down her left leg which makes her very weak and feels like her legs are going to give out on her. She does have known degenerative changes in her spine. The hydrocodone helps some however she continues to have significant pain which is debilitating, she has neuropathy in feet .there is been no change in bowel or bladder   Review Of Systems:  GEN- denies fatigue, fever, weight loss,weakness, recent illness HEENT- denies eye drainage, change in vision, nasal discharge, CVS- denies chest pain, palpitations RESP- denies SOB, cough, wheeze ABD- denies N/V, change in stools, abd pain GU- denies dysuria, hematuria, dribbling, incontinence MSK- +joint pain, +muscle aches, injury Neuro- denies headache, dizziness, syncope, seizure activity       Objective:    BP 132/74 mmHg  Pulse 88  Temp(Src) 98.4 F (36.9 C) (Oral)  Resp 16  Ht 5\' 3"  (1.6 m)  Wt 213 lb (96.616 kg)  BMI 37.74 kg/m2 GEN- NAD, alert and oriented x3 HEENT- PERRL, EOMI, non injected sclera, pink conjunctiva,  MMM, oropharynx clear CVS- RRR, no murmur RESP-CTAB MSK- TTP lumbar spine, SLR left side, fair ROM hips, spine EXT- No edema Pulses- Radial, DP- 2+        Assessment & Plan:      Problem List Items Addressed This Visit      Unprioritized   Type 2 diabetes mellitus with diabetic neuropathy - Primary   Relevant Orders   Hemoglobin A1C, fingerstick (Completed)   Comprehensive metabolic panel (Completed)   CBC with Differential/Platelet (Completed)   Microalbumin / creatinine urine ratio   Hyperlipidemia   Relevant Orders   Lipid panel (Completed)   Gout   Diabetic neuropathy   DDD (degenerative disc disease), lumbar   Relevant Medications   oxyCODONE-acetaminophen (PERCOCET) 7.5-325 MG per tablet   Back pain with left-sided radiculopathy   Relevant Medications   oxyCODONE-acetaminophen (PERCOCET) 7.5-325 MG per tablet   pregabalin (LYRICA) capsule      Note: This dictation was prepared with Dragon dictation along with smaller phrase technology. Any transcriptional errors that result from this process are unintentional.

## 2014-09-07 NOTE — Assessment & Plan Note (Signed)
MRI lumbar spine Trial of lyrica 75mg  BID Pain meds refilled R/O spinal stenosis, nerve impingment

## 2014-09-07 NOTE — Assessment & Plan Note (Signed)
Restart allopurinol

## 2014-09-07 NOTE — Assessment & Plan Note (Signed)
Well controlled no change to meds 

## 2014-09-07 NOTE — Patient Instructions (Addendum)
Referral to diabetes education in [place] MRI of spine to be done We will call with lab results Low carb, low sugar diet- handouts given Try the Lyrica for nerve pain Your A1C is 6.7% - Continue the Metformin once a day  F/U 3 months

## 2014-09-07 NOTE — Assessment & Plan Note (Signed)
a1c shows good control. Since she is unable to afford the insulin I will have her just take the metformin once a day and we will also put her in diabetic classes and have her watch her diet. She has been known to not watch her diet when she has just been on oral medications and her sugars have shot up

## 2014-09-08 ENCOUNTER — Encounter: Payer: Self-pay | Admitting: *Deleted

## 2014-09-08 ENCOUNTER — Ambulatory Visit: Payer: Medicare HMO | Admitting: Family Medicine

## 2014-09-08 LAB — MICROALBUMIN / CREATININE URINE RATIO: Creatinine, Urine: 19.4 mg/dL

## 2014-09-16 ENCOUNTER — Other Ambulatory Visit (HOSPITAL_COMMUNITY): Payer: Medicare HMO

## 2014-09-18 ENCOUNTER — Ambulatory Visit (HOSPITAL_COMMUNITY)
Admission: RE | Admit: 2014-09-18 | Discharge: 2014-09-18 | Disposition: A | Discharge status: Home / Self Care | Payer: Medicare HMO | Source: Ambulatory Visit | Attending: Family Medicine | Admitting: Family Medicine

## 2014-09-18 DIAGNOSIS — M5416 Radiculopathy, lumbar region: Secondary | ICD-10-CM | POA: Diagnosis not present

## 2014-09-18 DIAGNOSIS — M545 Low back pain: Secondary | ICD-10-CM | POA: Insufficient documentation

## 2014-09-18 DIAGNOSIS — M5410 Radiculopathy, site unspecified: Secondary | ICD-10-CM

## 2014-09-18 DIAGNOSIS — M5136 Other intervertebral disc degeneration, lumbar region: Secondary | ICD-10-CM

## 2014-09-22 ENCOUNTER — Telehealth: Payer: Self-pay | Admitting: Family Medicine

## 2014-09-22 NOTE — Telephone Encounter (Signed)
Patient is calling about mri results  [phone]

## 2014-09-22 NOTE — Telephone Encounter (Signed)
Please review notes on imaging.

## 2014-09-30 ENCOUNTER — Telehealth: Payer: Self-pay | Admitting: Family Medicine

## 2014-09-30 NOTE — Telephone Encounter (Signed)
Call placed to patient.   States that she took BP last night around 4:30pm and noted that her BP was elevated at 149/101. States that she took extra Losartan, and her BP was WNL this morning.   Advised to monitor FSBS BID x1 week and to call with readings.   Verbalized understanding.

## 2014-09-30 NOTE — Telephone Encounter (Signed)
Patient calling with questions about her elevated bp  Please call her at [phone]

## 2014-10-05 ENCOUNTER — Telehealth: Payer: Self-pay | Admitting: Family Medicine

## 2014-10-05 MED ORDER — OXYCODONE-ACETAMINOPHEN 7.5-325 MG PO TABS: 1.0000 | ORAL_TABLET | Freq: Two times a day (BID) | ORAL | Status: DC | PRN

## 2014-10-05 NOTE — Telephone Encounter (Signed)
Patient is calling about getting rx for her percocet   [phone]

## 2014-10-05 NOTE — Telephone Encounter (Signed)
Ok to refill??  Last office visit/ refill 09/07/2014.

## 2014-10-05 NOTE — Telephone Encounter (Signed)
Okay to refill? 

## 2014-10-05 NOTE — Telephone Encounter (Signed)
Prescription printed and patient made aware to come to office to pick up per VM.  

## 2014-10-10 ENCOUNTER — Other Ambulatory Visit: Payer: Self-pay | Admitting: Family Medicine

## 2014-10-12 ENCOUNTER — Telehealth: Payer: Self-pay | Admitting: Family Medicine

## 2014-10-12 NOTE — Telephone Encounter (Signed)
[phone] Patient calling for refill on her metformin  [facility] [place]

## 2014-10-12 NOTE — Telephone Encounter (Signed)
Refill appropriate and filled per protocol. 

## 2014-10-12 NOTE — Telephone Encounter (Signed)
Medication request handled by e-scribe from pharmacy.

## 2014-10-17 ENCOUNTER — Other Ambulatory Visit: Payer: Self-pay | Admitting: Family Medicine

## 2014-10-19 NOTE — Telephone Encounter (Signed)
Medication refilled per protocol. 

## 2014-10-29 ENCOUNTER — Ambulatory Visit: Payer: Medicare HMO | Admitting: Nutrition

## 2014-11-03 ENCOUNTER — Telehealth: Payer: Self-pay | Admitting: Family Medicine

## 2014-11-03 ENCOUNTER — Other Ambulatory Visit: Payer: Self-pay | Admitting: Family Medicine

## 2014-11-03 MED ORDER — OXYCODONE-ACETAMINOPHEN 7.5-325 MG PO TABS: 1.0000 | ORAL_TABLET | Freq: Two times a day (BID) | ORAL | Status: DC | PRN

## 2014-11-03 NOTE — Telephone Encounter (Signed)
Okay to refill? 

## 2014-11-03 NOTE — Telephone Encounter (Signed)
Ok to refill??  Last office visit 09/07/2014.  Last refill 10/05/2014.

## 2014-11-03 NOTE — Telephone Encounter (Signed)
Patient needs refill on percocet  [phone]

## 2014-11-03 NOTE — Telephone Encounter (Signed)
Prescription printed and patient made aware to come to office to pick up per VM.  

## 2014-11-03 NOTE — Telephone Encounter (Signed)
Medication refilled per protocol. 

## 2014-11-12 ENCOUNTER — Ambulatory Visit (INDEPENDENT_AMBULATORY_CARE_PROVIDER_SITE_OTHER): Payer: Medicare HMO

## 2014-11-12 ENCOUNTER — Ambulatory Visit (INDEPENDENT_AMBULATORY_CARE_PROVIDER_SITE_OTHER): Payer: Medicare HMO | Admitting: Orthopedic Surgery

## 2014-11-12 VITALS — BP 161/93 | Ht 63.0 in | Wt 218.0 lb

## 2014-11-12 DIAGNOSIS — M25562 Pain in left knee: Secondary | ICD-10-CM

## 2014-11-12 DIAGNOSIS — S76112A Strain of left quadriceps muscle, fascia and tendon, initial encounter: Secondary | ICD-10-CM

## 2014-11-12 NOTE — Patient Instructions (Signed)
We will schedule MRI for you and call you with results 

## 2014-11-14 ENCOUNTER — Encounter: Payer: Self-pay | Admitting: Orthopedic Surgery

## 2014-11-14 NOTE — Progress Notes (Signed)
Patient ID: Stephanie Hammond, female   DOB: January 22, 1951, 64 y.o.   MRN: [id number] Chief Complaint  Patient presents with  . Knee Pain    left knee pain, no known injury    History: This patient had a knee arthroplasty in 2009 in [place] but no longer wishes to go therefore orthopedic care. She complains of left knee pain locking giving out with dull aching medial and lateral pain. Interestingly though her pain is primarily at the quadriceps tendon insertion although she denies any trauma. She complains of constant 6 out of 10 pain. No treatment today. Medication oxycodone. She has not noticed any improvement. It's worse when she is on it for a significant period of time. She reported for [place] the following review of systems dental issues joint pain back pain stiff joints muscle weakness burning pain in her legs but no other abnormalities  Past Medical History  Diagnosis Date  . IBS (irritable bowel syndrome)   . GERD (gastroesophageal reflux disease)   . Anemia   . History of GI diverticular bleed 08/2004  . Diverticulitis   . Diverticulosis   . CAD (coronary artery disease)   . Osteoarthritis   . Diabetes mellitus   . Blood transfusion 2009    after GI bleed  . Gout     BP 161/93 mmHg  Ht 5\' 3"  (1.6 m)  Wt 218 lb (98.884 kg)  BMI 38.63 kg/m2 Her appearance shows well-developed developed well-nourished female no acute distress She is oriented person place and time Her mood is pleasant her affect is normal She has reasonable gait pattern without significant limp  She has tenderness at the quadriceps tendon primarily and then mild medial joint line and lateral joint line tenderness without effusion she can extend the knee with some weakness there is no defect. The ligaments are stable medial lateral and sagittal plane and motor exam is normal. She has no skin defects sensation intact and pulses are good  Her x-ray I interpreted as 50% loss of cartilage primarily medial  compartment  However this unusual amount of quadriceps tenderness makes me think that she may have a partial quadriceps tendon tear may be degenerative as she does diabetic  Recommend MRI to rule out a quadriceps tendon tear before proceeding with any joint procedures

## 2014-11-16 ENCOUNTER — Encounter: Payer: Self-pay | Admitting: Family Medicine

## 2014-11-24 ENCOUNTER — Ambulatory Visit: Payer: Medicare HMO | Admitting: Family Medicine

## 2014-11-25 ENCOUNTER — Encounter: Payer: Self-pay | Admitting: Family Medicine

## 2014-11-25 ENCOUNTER — Ambulatory Visit (INDEPENDENT_AMBULATORY_CARE_PROVIDER_SITE_OTHER): Payer: Medicare HMO | Admitting: Family Medicine

## 2014-11-25 ENCOUNTER — Other Ambulatory Visit: Payer: Self-pay | Admitting: Family Medicine

## 2014-11-25 VITALS — BP 156/94 | HR 82 | Temp 98.2°F | Resp 14 | Ht 63.0 in | Wt 218.0 lb

## 2014-11-25 DIAGNOSIS — E669 Obesity, unspecified: Secondary | ICD-10-CM | POA: Diagnosis not present

## 2014-11-25 DIAGNOSIS — I10 Essential (primary) hypertension: Secondary | ICD-10-CM

## 2014-11-25 MED ORDER — AMLODIPINE BESYLATE 10 MG PO TABS: 10.0000 mg | ORAL_TABLET | Freq: Every day | ORAL | Status: DC

## 2014-11-25 MED ORDER — OXYCODONE-ACETAMINOPHEN 7.5-325 MG PO TABS: 1.0000 | ORAL_TABLET | Freq: Two times a day (BID) | ORAL | Status: DC | PRN

## 2014-11-25 NOTE — Progress Notes (Signed)
Patient ID: Stephanie Hammond, female   DOB: 1950-11-25, 64 y.o.   MRN: [id number]   Subjective:    Patient ID: Stephanie Hammond, female    DOB: Mar 11, 1951, 64 y.o.   MRN: [id number]  Patient presents for HTN  patient follow-up her blood pressure. For the past week her blood pressure has been quite elevated running 762-263 systolic over 33L to 456Y. She's also had a mild headache but that is now resolved with the use of Tylenol. I'll review her previous blood pressures when she was at the orthopedics office earlier this month her blood pressure was also elevated with systolic 563S. She states that she's been taking her losartan as prescribed I reviewed her medication bottle she has been picking up on a regular basis.  She requested a refill on her pain medication   Review Of Systems:  GEN- denies fatigue, fever, weight loss,weakness, recent illness HEENT- denies eye drainage, change in vision, nasal discharge, CVS- denies chest pain, palpitations RESP- denies SOB, cough, wheeze ABD- denies N/V, change in stools, abd pain GU- denies dysuria, hematuria, dribbling, incontinence MSK- + joint pain, muscle aches, injury Neuro- + headache, dizziness, syncope, seizure activity       Objective:    BP 156/94 mmHg  Pulse 82  Temp(Src) 98.2 F (36.8 C) (Oral)  Resp 14  Ht 5\' 3"  (1.6 m)  Wt 218 lb (98.884 kg)  BMI 38.63 kg/m2 GEN- NAD, alert and oriented x3 HEENT- PERRL, EOMI, non injected sclera, pink conjunctiva, MMM, oropharynx clear Neck- Supple, no thyromegaly CVS- RRR, no murmur RESP-CTAB EXT- No edema Pulses- Radial,  2+        Assessment & Plan:      Problem List Items Addressed This Visit    Obesity   Essential hypertension, benign - Primary   Relevant Medications   amLODipine (NORVASC) 10 MG tablet   Other Relevant Orders   CBC with Differential/Platelet   Basic metabolic panel      Note: This dictation was prepared with Dragon dictation along with smaller  phrase technology. Any transcriptional errors that result from this process are unintentional.

## 2014-11-25 NOTE — Assessment & Plan Note (Signed)
I will add amlodipine 10 mg to her current regimen of losartan. Her headache is now resolved. There is no evidence of any fluid overload. We discussed dietary changes she is also gaining some weight.

## 2014-11-25 NOTE — Patient Instructions (Signed)
We will call with lab results Start norvasc 10mg  along with losartan for blood pressure F/U as previous

## 2014-11-26 NOTE — Telephone Encounter (Signed)
Medication refilled per protocol. 

## 2014-11-27 ENCOUNTER — Ambulatory Visit (HOSPITAL_COMMUNITY): Payer: Medicare HMO

## 2014-12-01 ENCOUNTER — Telehealth: Payer: Self-pay | Admitting: Family Medicine

## 2014-12-01 MED ORDER — METFORMIN HCL ER 500 MG PO TB24: 1000.0000 mg | ORAL_TABLET | Freq: Every day | ORAL | Status: DC

## 2014-12-01 MED ORDER — METFORMIN HCL ER 500 MG PO TB24: 500.0000 mg | ORAL_TABLET | Freq: Every day | ORAL | Status: DC

## 2014-12-01 NOTE — Telephone Encounter (Signed)
Pharmacy is telling her next 90 day Rx can not be filled until 12/20/14. She has already used current 90 day supply.  Unless the get a new prescription for increased doses, they can not fill and pt is out.

## 2014-12-01 NOTE — Telephone Encounter (Signed)
Okay to change script to 2 tablets daily ( 1000mg ) so they will fill

## 2014-12-01 NOTE — Telephone Encounter (Signed)
Pt calling says she has been out of her metformin since Saturday.  Says is not taking QD as instructed because when her sugar is high she will take 2.  Says BS today is 240.  I asked her, does the doctor know she is doing that?  She said YES.  Med list shows metformin was refill 4/21 #90. She said the pharmacy did not have a refill for her and refill not due until 5/15??  I sent new refill to pharmacy, told pt to call back if problem.

## 2014-12-01 NOTE — Telephone Encounter (Signed)
Prescription sent to pharmacy. .   Call placed to patient and patient made aware.  

## 2014-12-03 ENCOUNTER — Ambulatory Visit (HOSPITAL_COMMUNITY)
Admission: RE | Admit: 2014-12-03 | Discharge: 2014-12-03 | Disposition: A | Discharge status: Home / Self Care | Payer: Medicare HMO | Source: Ambulatory Visit | Attending: Orthopedic Surgery | Admitting: Orthopedic Surgery

## 2014-12-03 DIAGNOSIS — S83282A Other tear of lateral meniscus, current injury, left knee, initial encounter: Secondary | ICD-10-CM | POA: Diagnosis not present

## 2014-12-03 DIAGNOSIS — S83242A Other tear of medial meniscus, current injury, left knee, initial encounter: Secondary | ICD-10-CM | POA: Diagnosis not present

## 2014-12-03 DIAGNOSIS — M7122 Synovial cyst of popliteal space [Baker], left knee: Secondary | ICD-10-CM | POA: Insufficient documentation

## 2014-12-03 DIAGNOSIS — M25462 Effusion, left knee: Secondary | ICD-10-CM | POA: Insufficient documentation

## 2014-12-03 DIAGNOSIS — S76112A Strain of left quadriceps muscle, fascia and tendon, initial encounter: Secondary | ICD-10-CM | POA: Diagnosis not present

## 2014-12-03 DIAGNOSIS — M1712 Unilateral primary osteoarthritis, left knee: Secondary | ICD-10-CM | POA: Insufficient documentation

## 2014-12-03 DIAGNOSIS — X58XXXA Exposure to other specified factors, initial encounter: Secondary | ICD-10-CM | POA: Diagnosis not present

## 2014-12-07 ENCOUNTER — Telehealth: Payer: Self-pay | Admitting: Orthopedic Surgery

## 2014-12-07 NOTE — Telephone Encounter (Signed)
Results given   tka vs scope   Needs medical clearance   Knee gives way / h/o back problems   Will call us back   Needs med clearance if tka

## 2014-12-07 NOTE — Telephone Encounter (Signed)
She ahs arthritis and torn cartilage   i ll call her today

## 2014-12-07 NOTE — Telephone Encounter (Signed)
Call received from patient regarding MRI results, states had done 12/03/14, at [facility]  Her ph# is [phone]

## 2014-12-21 ENCOUNTER — Telehealth: Payer: Self-pay | Admitting: Orthopedic Surgery

## 2014-12-21 ENCOUNTER — Encounter: Payer: Self-pay | Admitting: Family Medicine

## 2014-12-21 NOTE — Telephone Encounter (Signed)
Patient called, states wishes to schedule surgery for the end of June, per previous notes.  Need to schedule office visit, or can surgery be set up for approximately then?  Her ph# [phone].  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

## 2014-12-22 NOTE — Telephone Encounter (Signed)
Patient called back to check in regarding whether another appointment is needed, or okay to be added to surgery schedule for end of June.  Since her last appointment was early April, I have scheduled for an appointment in the office, for re-check and discussing of surgery.

## 2014-12-23 NOTE — Telephone Encounter (Signed)
Patient no longer has appointment scheduled and was advised per Arbie Cookey that medical clearance from her PCP

## 2014-12-25 ENCOUNTER — Encounter: Payer: Self-pay | Admitting: *Deleted

## 2014-12-29 ENCOUNTER — Telehealth: Payer: Self-pay | Admitting: Family Medicine

## 2014-12-29 NOTE — Telephone Encounter (Signed)
Ok to refill??  Last office visit/ refill 11/25/2014.

## 2014-12-29 NOTE — Telephone Encounter (Signed)
Patient calling for percocet prescription  [phone]

## 2014-12-30 MED ORDER — OXYCODONE-ACETAMINOPHEN 7.5-325 MG PO TABS: 1.0000 | ORAL_TABLET | Freq: Two times a day (BID) | ORAL | Status: DC | PRN

## 2014-12-30 NOTE — Telephone Encounter (Signed)
Okay to refill? 

## 2014-12-30 NOTE — Telephone Encounter (Signed)
Prescription printed and patient made aware to come to office to pick up after lunch on 12/30/2014.

## 2015-01-07 ENCOUNTER — Ambulatory Visit: Payer: Medicare HMO | Admitting: Orthopedic Surgery

## 2015-01-08 ENCOUNTER — Ambulatory Visit: Payer: Medicare HMO | Admitting: Family Medicine

## 2015-01-08 ENCOUNTER — Other Ambulatory Visit: Payer: Self-pay | Admitting: Family Medicine

## 2015-01-08 ENCOUNTER — Ambulatory Visit: Payer: Medicare HMO | Admitting: Nutrition

## 2015-01-12 ENCOUNTER — Encounter: Payer: Self-pay | Admitting: Family Medicine

## 2015-01-12 ENCOUNTER — Ambulatory Visit (INDEPENDENT_AMBULATORY_CARE_PROVIDER_SITE_OTHER): Payer: Medicare HMO | Admitting: Family Medicine

## 2015-01-12 VITALS — BP 140/90 | HR 88 | Temp 98.8°F | Resp 16 | Ht 63.0 in | Wt 216.0 lb

## 2015-01-12 DIAGNOSIS — E1140 Type 2 diabetes mellitus with diabetic neuropathy, unspecified: Secondary | ICD-10-CM | POA: Diagnosis not present

## 2015-01-12 DIAGNOSIS — Z01818 Encounter for other preprocedural examination: Secondary | ICD-10-CM | POA: Diagnosis not present

## 2015-01-12 DIAGNOSIS — I10 Essential (primary) hypertension: Secondary | ICD-10-CM

## 2015-01-12 DIAGNOSIS — K589 Irritable bowel syndrome without diarrhea: Secondary | ICD-10-CM | POA: Diagnosis not present

## 2015-01-12 LAB — CBC WITH DIFFERENTIAL/PLATELET
BASOS ABS: 0.1 10*3/uL (ref 0.0–0.1)
Basophils Relative: 1 % (ref 0–1)
EOS ABS: 0.2 10*3/uL (ref 0.0–0.7)
EOS PCT: 3 % (ref 0–5)
HCT: 35.7 % — ABNORMAL LOW (ref 36.0–46.0)
Hemoglobin: 12 g/dL (ref 12.0–15.0)
Lymphocytes Relative: 30 % (ref 12–46)
Lymphs Abs: 2.1 10*3/uL (ref 0.7–4.0)
MCH: 31 pg (ref 26.0–34.0)
MCHC: 33.6 g/dL (ref 30.0–36.0)
MCV: 92.2 fL (ref 78.0–100.0)
MPV: 9.3 fL (ref 8.6–12.4)
Monocytes Absolute: 0.4 10*3/uL (ref 0.1–1.0)
Monocytes Relative: 6 % (ref 3–12)
Neutro Abs: 4.2 10*3/uL (ref 1.7–7.7)
Neutrophils Relative %: 60 % (ref 43–77)
Platelets: 341 10*3/uL (ref 150–400)
RBC: 3.87 MIL/uL (ref 3.87–5.11)
RDW: 14.5 % (ref 11.5–15.5)
WBC: 7 10*3/uL (ref 4.0–10.5)

## 2015-01-12 LAB — COMPREHENSIVE METABOLIC PANEL
ALT: 13 U/L (ref 0–35)
AST: 13 U/L (ref 0–37)
Albumin: 3.8 g/dL (ref 3.5–5.2)
Alkaline Phosphatase: 110 U/L (ref 39–117)
BUN: 14 mg/dL (ref 6–23)
CHLORIDE: 104 meq/L (ref 96–112)
CO2: 25 mEq/L (ref 19–32)
Calcium: 9.4 mg/dL (ref 8.4–10.5)
Creat: 0.74 mg/dL (ref 0.50–1.10)
Glucose, Bld: 127 mg/dL — ABNORMAL HIGH (ref 70–99)
Potassium: 4.1 mEq/L (ref 3.5–5.3)
Sodium: 137 mEq/L (ref 135–145)
TOTAL PROTEIN: 6.9 g/dL (ref 6.0–8.3)
Total Bilirubin: 0.3 mg/dL (ref 0.2–1.2)

## 2015-01-12 LAB — HEMOGLOBIN A1C
Hgb A1c MFr Bld: 6.7 % — ABNORMAL HIGH (ref <5.7)
Mean Plasma Glucose: 146 mg/dL — ABNORMAL HIGH (ref <117)

## 2015-01-12 MED ORDER — FLORA-Q 2 PO CAPS: ORAL_CAPSULE | ORAL | Status: DC

## 2015-01-12 NOTE — Assessment & Plan Note (Signed)
D/c pepto bismol Advised to use probiotics

## 2015-01-12 NOTE — Progress Notes (Signed)
Patient ID: Stephanie Hammond, female   DOB: [birth year], 64 y.o.   MRN: [id number]   Subjective:    Patient ID: Stephanie Hammond, female    DOB: October 29, 1950, 64 y.o.   MRN: [id number]  Patient presents for Surgical Clearence  patient here for surgical clearance for knee replacement. She is history of hypertension diabetes mellitus hyperlipidemia. She states that her blood sugars have been good whenever when she brought her meter her fasting blood sugars have ranged from 1:30 to 160s her evening blood sugars post-meals 200-300. She denies any hypoglycemia. She is taking her metformin as prescribed. She is not watching her diet and is eating a significant amount of sweets. Her last A1c was 6.7% in February.  Hypertension she did not take any of her medications today because she knew she needed fasting blood work.  He denies any chest pain shortness of breath with activities.  Continues to have problems with bowels after eating, long standing, taking PeptoBismol, meds from GI in the past have not helped. No blood in stool  Review Of Systems:  GEN- denies fatigue, fever, weight loss,weakness, recent illness HEENT- denies eye drainage, change in vision, nasal discharge, CVS- denies chest pain, palpitations RESP- denies SOB, cough, wheeze ABD- denies N/V, change in stools, abd pain GU- denies dysuria, hematuria, dribbling, incontinence MSK- +joint pain, muscle aches, injury Neuro- denies headache, dizziness, syncope, seizure activity       Objective:    BP 140/90 mmHg  Pulse 88  Temp(Src) 98.8 F (37.1 C) (Oral)  Resp 16  Ht 5\' 3"  (1.6 m)  Wt 216 lb (97.977 kg)  BMI 38.27 kg/m2 GEN- NAD, alert and oriented x3 HEENT- PERRL, EOMI, non injected sclera, pink conjunctiva, MMM, oropharynx clear Neck- Supple, no thyromegaly CVS- RRR, no murmur RESP-CTAB ABD-NABS,soft,NT,ND EXT- No edema Pulses- Radial, DP- 2+  EKG-NSR      Assessment & Plan:      Problem List Items Addressed  This Visit    Type 2 diabetes mellitus with diabetic neuropathy - Primary    Concern that A1C is now uncontrolled, compliance with diet is an issue  Goal A1C < 7%      Relevant Orders   CBC with Differential/Platelet   Comprehensive metabolic panel   Hemoglobin A1c   Preoperative clearance    BP controlled when meds are taken EKG normal A1C less than 8% will clear for surgery      IBS (irritable bowel syndrome)    D/c pepto bismol Advised to use probiotics      Relevant Medications   Probiotic Product (FLORA-Q 2) CAPS   Essential hypertension, benign   Relevant Orders   EKG 12-Lead (Completed)      Note: This dictation was prepared with Dragon dictation along with smaller phrase technology. Any transcriptional errors that result from this process are unintentional.

## 2015-01-12 NOTE — Assessment & Plan Note (Signed)
Concern that A1C is now uncontrolled, compliance with diet is an issue  Goal A1C < 7%

## 2015-01-12 NOTE — Patient Instructions (Signed)
Continue current medications Stop the pepto Bismol Take the Probiotics once a day, if insurance does not cover get Over the counter- Culturelle  F/U 4 months

## 2015-01-12 NOTE — Assessment & Plan Note (Signed)
BP controlled when meds are taken EKG normal A1C less than 8% will clear for surgery

## 2015-01-13 ENCOUNTER — Encounter: Payer: Self-pay | Admitting: Family Medicine

## 2015-01-14 ENCOUNTER — Telehealth: Payer: Self-pay | Admitting: Orthopedic Surgery

## 2015-01-14 NOTE — Telephone Encounter (Signed)
Patient called to relay that Dr Buelah Manis was to have faxed a note of medical clearance for her surgery. She would like to schedule it for the beginning of August.  Please advise. Her home ph# is [phone]; cell# is [phone].

## 2015-01-15 ENCOUNTER — Telehealth: Payer: Self-pay | Admitting: *Deleted

## 2015-01-15 NOTE — Telephone Encounter (Signed)
Received call from patient.   Reports that she has been having some vaginal cramping. States that intermittently it feels like there is a hot poker in her vagina.   Advised that patient needs to be seen. States that she would like to wait over the weekend and will call back if it happens again.   MD to be made aware.

## 2015-01-15 NOTE — Telephone Encounter (Signed)
noted 

## 2015-01-19 ENCOUNTER — Other Ambulatory Visit: Payer: Self-pay | Admitting: Family Medicine

## 2015-01-19 NOTE — Telephone Encounter (Signed)
Refill appropriate and filled per protocol. 

## 2015-01-19 NOTE — Telephone Encounter (Signed)
LAST OV HERE 11/12/14, REQUESTING TOTAL KNEE 03/12/15, HAVE RECEIVED CLEARANCE FROM PCP, WOULD YOU LIKE HER TO HAVE A PRE OP HERE AS WELL?

## 2015-01-20 ENCOUNTER — Other Ambulatory Visit: Payer: Self-pay | Admitting: *Deleted

## 2015-02-01 ENCOUNTER — Telehealth: Payer: Self-pay | Admitting: Family Medicine

## 2015-02-01 MED ORDER — OXYCODONE-ACETAMINOPHEN 7.5-325 MG PO TABS: 1.0000 | ORAL_TABLET | Freq: Two times a day (BID) | ORAL | Status: DC | PRN

## 2015-02-01 NOTE — Telephone Encounter (Signed)
Ok to refill??  Last office visit 01/12/2015.  Last refill 12/30/2014.

## 2015-02-01 NOTE — Telephone Encounter (Signed)
Patient is calling for refill on percocet  [phone] when ready

## 2015-02-01 NOTE — Telephone Encounter (Signed)
Prescription printed and patient made aware to come to office to pick up on 02/02/2015. 

## 2015-02-01 NOTE — Telephone Encounter (Signed)
Okay to refill? 

## 2015-02-05 ENCOUNTER — Other Ambulatory Visit: Payer: Self-pay | Admitting: Family Medicine

## 2015-02-05 NOTE — Telephone Encounter (Signed)
Medication refilled per protocol. 

## 2015-02-25 ENCOUNTER — Encounter: Payer: Self-pay | Admitting: Nutrition

## 2015-03-01 ENCOUNTER — Telehealth: Payer: Self-pay | Admitting: Family Medicine

## 2015-03-01 MED ORDER — OXYCODONE-ACETAMINOPHEN 7.5-325 MG PO TABS: 1.0000 | ORAL_TABLET | Freq: Two times a day (BID) | ORAL | Status: DC | PRN

## 2015-03-01 NOTE — Telephone Encounter (Signed)
Prescription printed and patient made aware to come to office to pick up on 03/01/2015 after 2pm via VM.

## 2015-03-01 NOTE — Telephone Encounter (Signed)
Patient called in requesting a prescription for oxyCODONE-acetaminophen (PERCOCET)

## 2015-03-01 NOTE — Telephone Encounter (Signed)
Ok to refill??  Last office visit 01/12/2015.  Last refill 02/01/2015.

## 2015-03-01 NOTE — Telephone Encounter (Signed)
Okay to refill? 

## 2015-03-03 ENCOUNTER — Telehealth: Payer: Self-pay | Admitting: *Deleted

## 2015-03-03 ENCOUNTER — Telehealth: Payer: Self-pay | Admitting: Orthopedic Surgery

## 2015-03-03 NOTE — Telephone Encounter (Signed)
REFERRAL FAXED TO [facility] S/P KNEE REPLACEMENT 8/5  REFERRAL FAXED TO [place] S/P KNEE REPLACEMENT 8/5

## 2015-03-03 NOTE — Telephone Encounter (Signed)
Regarding in-patient/admit surgery 03/12/15 at [facility], total arthroplasty,left knee, CPT [id number], ICD-10 M25.562 and M17.121- Per on-line portal for [employer], need to contact via phone: direct # [phone].  Per Legrand Como, received CASE ID# [id number].  Next connected to nurse reviewer, Patrecia Pour, at which time, Baldomero Lamy, LPN completed the clinical questionnaire verbally. Approval received: AUTHORIZATION# [id number].

## 2015-03-04 NOTE — Patient Instructions (Signed)
Stephanie Hammond  03/04/2015     @PREFPERIOPPHARMACY @   Your procedure is scheduled on 03/12/2015   Report to [facility] at [place].M.  Call this number if you have problems the morning of surgery:  [phone]   Remember:  Do not eat food or drink liquids after midnight.  Take these medicines the morning of surgery with A SIP OF WATER Allopurinol, Prilosec, Oxycodone if needed, Amlodipine, Losartan   DO NOT TAKE METFORMIN AM OF SURGERY     Do not wear jewelry, make-up or nail polish.  Do not wear lotions, powders, or perfumes.  You may wear deodorant.  Do not shave 48 hours prior to surgery.  Men may shave face and neck.  Do not bring valuables to the hospital.  [facility] is not responsible for any belongings or valuables.  Contacts, dentures or bridgework may not be worn into surgery.  Leave your suitcase in the car.  After surgery it may be brought to your room.  For patients admitted to the hospital, discharge time will be determined by your treatment team.  Patients discharged the day of surgery will not be allowed to drive home.   Please read over the following fact sheets that you were given. Surgical Site Infection Prevention and Anesthesia Post-op Instructions       Total Knee Replacement Total knee replacement is a procedure to replace your knee joint with an artificial knee joint (prosthetic knee joint). The purpose of this surgery is to reduce pain and improve your knee function. LET [facility] CARE PROVIDER KNOW ABOUT:   Any allergies you have.  All medicines you are taking, including vitamins, herbs, eye drops, creams, and over-the-counter medicines.  Previous problems you or members of your family have had with the use of anesthetics.  Any blood disorders you have.  Previous surgeries you have had.  Medical conditions you have. RISKS AND COMPLICATIONS  Generally, total knee replacement is a safe procedure. However, problems can occur and  include:  Loss of range of motion of the knee or instability.  Loosening of the prosthesis.  Infection.  Persistent pain. BEFORE THE PROCEDURE   Do not eat or drink anything after midnight on the night before the procedure or as directed by your health care provider.  Ask your health care provider about changing or stopping your regular medicines. This is especially important if you are taking diabetes medicines or blood thinners. PROCEDURE   Just before the procedure, you will receive medicine that will make you drowsy (sedative). This will be given through a tube that is inserted into one of your veins (IV tube).  Then you will be given one of the following:  A medicine injected into your spine that numbs your body below the waist (spinal anesthetic).  A medicine that makes you fall asleep (general anesthetic).  You may also receive medicine to block feeling in your leg (nerve block) to help ease pain after surgery.  An incision will be made in your knee. Your surgeon will take out any damaged cartilage and bone by sawing off the damaged surfaces.  The surgeon will then put a new metal liner over the sawed-off portion of your thigh bone (femur) and a plastic liner over the sawed-off portion of one of the bones of your lower leg (tibia). This is to restore alignment and function to your knee. A plastic piece is often used to restore the surface of your knee cap. AFTER THE PROCEDURE  You will be taken to the recovery area.  You may have drainage tubes to drain excess fluid from your knee. These tubes attach to a device that removes these fluids.  Once you are awake, stable, and taking fluids well, you will be taken to your hospital room.  You will receive physical therapy as prescribed by your health care provider.  Your surgeon may recommend that you spend time (usually an additional 10-14 days) in an extended-care facility to help you begin walking again and improve your  range of motion before you go home.  You may also be prescribed blood-thinning medicine to decrease your risk of developing blood clots in your leg. Document Released: 10/30/2000 Document Revised: 12/08/2013 Document Reviewed: 09/03/2011 [facility] Patient Information 2015 [place], [place]. This information is not intended to replace advice given to you by your health care provider. Make sure you discuss any questions you have with your health care provider.  General Anesthesia General anesthesia is a sleep-like state of non-feeling produced by medicines (anesthetics). General anesthesia prevents you from being alert and feeling pain during a medical procedure. Your caregiver may recommend general anesthesia if your procedure:  Is long.  Is painful or uncomfortable.  Would be frightening to see or hear.  Requires you to be still.  Affects your breathing.  Causes significant blood loss. LET YOUR CAREGIVER KNOW ABOUT:  Allergies to food or medicine.  Medicines taken, including vitamins, herbs, eyedrops, over-the-counter medicines, and creams.  Use of steroids (by mouth or creams).  Previous problems with anesthetics or numbing medicines, including problems experienced by relatives.  History of bleeding problems or blood clots.  Previous surgeries and types of anesthetics received.  Possibility of pregnancy, if this applies.  Use of cigarettes, alcohol, or illegal drugs.  Any health condition(s), especially diabetes, sleep apnea, and high blood pressure. RISKS AND COMPLICATIONS General anesthesia rarely causes complications. However, if complications do occur, they can be life threatening. Complications include:  A lung infection.  A stroke.  A heart attack.  Waking up during the procedure. When this occurs, the patient may be unable to move and communicate that he or she is awake. The patient may feel severe pain. Older adults and adults with serious medical problems are  more likely to have complications than adults who are young and healthy. Some complications can be prevented by answering all of your caregiver's questions thoroughly and by following all pre-procedure instructions. It is important to tell your caregiver if any of the pre-procedure instructions, especially those related to diet, were not followed. Any food or liquid in the stomach can cause problems when you are under general anesthesia. BEFORE THE PROCEDURE  Ask your caregiver if you will have to spend the night at the hospital. If you will not have to spend the night, arrange to have an adult drive you and stay with you for 24 hours.  Follow your caregiver's instructions if you are taking dietary supplements or medicines. Your caregiver may tell you to stop taking them or to reduce your dosage.  Do not smoke for as long as possible before your procedure. If possible, stop smoking 3-6 weeks before the procedure.  Do not take new dietary supplements or medicines within 1 week of your procedure unless your caregiver approves them.  Do not eat within 8 hours of your procedure or as directed by your caregiver. Drink only clear liquids, such as water, black coffee (without milk or cream), and fruit juices (without pulp).  Do not  drink within 3 hours of your procedure or as directed by your caregiver.  You may brush your teeth on the morning of the procedure, but make sure to spit out the toothpaste and water when finished. PROCEDURE  You will receive anesthetics through a mask, through an intravenous (IV) access tube, or through both. A doctor who specializes in anesthesia (anesthesiologist) or a nurse who specializes in anesthesia (nurse anesthetist) or both will stay with you throughout the procedure to make sure you remain unconscious. He or she will also watch your blood pressure, pulse, and oxygen levels to make sure that the anesthetics do not cause any problems. Once you are asleep, a breathing  tube or mask may be used to help you breathe. AFTER THE PROCEDURE You will wake up after the procedure is complete. You may be in the room where the procedure was performed or in a recovery area. You may have a sore throat if a breathing tube was used. You may also feel:  Dizzy.  Weak.  Drowsy.  Confused.  Nauseous.  Cold. These are all normal responses and can be expected to last for up to 24 hours after the procedure is complete. A caregiver will tell you when you are ready to go home. This will usually be when you are fully awake and in stable condition. Document Released: 10/31/2007 Document Revised: 12/08/2013 Document Reviewed: 11/22/2011 [facility] Patient Information 2015 [place], [place]. This information is not intended to replace advice given to you by your health care provider. Make sure you discuss any questions you have with your health care provider.

## 2015-03-08 ENCOUNTER — Encounter (HOSPITAL_COMMUNITY): Payer: Self-pay

## 2015-03-08 ENCOUNTER — Encounter (HOSPITAL_COMMUNITY)
Admission: RE | Admit: 2015-03-08 | Discharge: 2015-03-08 | Disposition: A | Discharge status: Home / Self Care | Payer: Medicare HMO | Source: Ambulatory Visit | Attending: Orthopedic Surgery | Admitting: Orthopedic Surgery

## 2015-03-08 DIAGNOSIS — M179 Osteoarthritis of knee, unspecified: Secondary | ICD-10-CM | POA: Insufficient documentation

## 2015-03-08 DIAGNOSIS — Z01818 Encounter for other preprocedural examination: Secondary | ICD-10-CM | POA: Insufficient documentation

## 2015-03-08 HISTORY — DX: Essential (primary) hypertension: I10

## 2015-03-08 LAB — CBC WITH DIFFERENTIAL/PLATELET
Basophils Absolute: 0.1 10*3/uL (ref 0.0–0.1)
Basophils Relative: 1 % (ref 0–1)
Eosinophils Absolute: 0.2 10*3/uL (ref 0.0–0.7)
Eosinophils Relative: 3 % (ref 0–5)
HEMATOCRIT: 34 % — AB (ref 36.0–46.0)
Hemoglobin: 12.1 g/dL (ref 12.0–15.0)
LYMPHS ABS: 1.6 10*3/uL (ref 0.7–4.0)
Lymphocytes Relative: 28 % (ref 12–46)
MCH: 31.6 pg (ref 26.0–34.0)
MCHC: 35.6 g/dL (ref 30.0–36.0)
MCV: 88.8 fL (ref 78.0–100.0)
MONO ABS: 0.4 10*3/uL (ref 0.1–1.0)
Monocytes Relative: 7 % (ref 3–12)
NEUTROS ABS: 3.3 10*3/uL (ref 1.7–7.7)
Neutrophils Relative %: 61 % (ref 43–77)
Platelets: 322 10*3/uL (ref 150–400)
RBC: 3.83 MIL/uL — ABNORMAL LOW (ref 3.87–5.11)
RDW: 13 % (ref 11.5–15.5)
WBC: 5.5 10*3/uL (ref 4.0–10.5)

## 2015-03-08 LAB — BASIC METABOLIC PANEL
Anion gap: 9 (ref 5–15)
BUN: 13 mg/dL (ref 6–20)
CALCIUM: 9.1 mg/dL (ref 8.9–10.3)
CO2: 23 mmol/L (ref 22–32)
Chloride: 105 mmol/L (ref 101–111)
Creatinine, Ser: 0.76 mg/dL (ref 0.44–1.00)
GLUCOSE: 175 mg/dL — AB (ref 65–99)
Potassium: 4.2 mmol/L (ref 3.5–5.1)
Sodium: 137 mmol/L (ref 135–145)

## 2015-03-08 LAB — SURGICAL PCR SCREEN
MRSA, PCR: NEGATIVE
Staphylococcus aureus: NEGATIVE

## 2015-03-08 LAB — ABO/RH: ABO/RH(D): B POS

## 2015-03-08 LAB — PROTIME-INR
INR: 1.07 (ref 0.00–1.49)
PROTHROMBIN TIME: 14.1 s (ref 11.6–15.2)

## 2015-03-08 LAB — APTT: aPTT: 27 seconds (ref 24–37)

## 2015-03-09 NOTE — H&P (Addendum)
TOTAL KNEE ADMISSION H&P  Patient is being admitted for left total knee arthroplasty.  Subjective:  Chief Complaint:left knee pain.  HPI: Stephanie Hammond, 64 y.o. female, has a history of pain and functional disability in the left knee due to arthritis and has failed non-surgical conservative treatments for greater than 12 weeks to includeNSAID's and/or analgesics, use of assistive devices and activity modification.  Onset of symptoms was gradual, starting several years ago  years ago with gradually worsening course since that time. The patient noted no past surgery on the left knee(s).  Patient currently rates pain in the left knee(s) at 6 out of 10 with activity. Patient has night pain, worsening of pain with activity and weight bearing, pain that interferes with activities of daily living, pain with passive range of motion, crepitus and joint swelling.  Patient has evidence of subchondral cysts, subchondral sclerosis, periarticular osteophytes and joint space narrowing by imaging studies. There is no active infection.  The patient is on Percocet and has not had relief of her symptoms. Initially her symptoms were thought to be related to quadriceps tendinitis but the MRI does indeed show that she has significant and severe osteoarthritis. We discussed possible arthroscopy versus knee replacement and based on her symptoms and desire for definitive care she opted for knee replacement.  Patient Active Problem List   Diagnosis Date Noted  . Preoperative clearance 01/12/2015  . Gout 09/07/2014  . Back pain with left-sided radiculopathy 09/07/2014  . Pain in joint, pelvic region and thigh 06/25/2014  . Type 2 diabetes mellitus with diabetic neuropathy 02/14/2014  . Hyperlipidemia 03/12/2013  . IBS (irritable bowel syndrome) 01/09/2013  . Diabetic neuropathy 11/14/2012  . Diarrhea 09/13/2012  . Major depression, single episode 09/13/2012  . Essential hypertension, benign 06/09/2012  .  Tachycardia 03/28/2012  . Diabetes with neurologic complications 50/04/3817  . DDD (degenerative disc disease), lumbar 03/28/2012  . Chronic knee pain 03/28/2012  . Obesity 03/28/2012  . Tobacco user 03/28/2012  . CAD (coronary artery disease) 03/28/2012   Past Medical History  Diagnosis Date  . IBS (irritable bowel syndrome)   . GERD (gastroesophageal reflux disease)   . Anemia   . History of GI diverticular bleed 08/2004  . Diverticulitis   . Diverticulosis   . Osteoarthritis   . Diabetes mellitus   . Blood transfusion 2009    after GI bleed  . Gout   . CAD (coronary artery disease)   . Hypertension     Past Surgical History  Procedure Laterality Date  . Ptca    . Vaginal hysterectomy    . Cholecystectomy  2003  . Replacement total knee      right   . Colonoscopy  2000    Dr. Fuller Plan: marked diverticulosis, difficult procedure due to adhesions, polyps benign  . Colonoscopy  2006    Dr. Henrene Pastor: severe pandiverticulosis  . Colonoscopy N/A 02/03/2013    SLF: 4 COLORECTAL POLYPS REMOVED/Moderate diverticulosis throughout the entire examined colon/Small internal hemorrhoids  . Joint replacement Right 2009    Knee Replacement Right     No prescriptions prior to admission   Allergies  Allergen Reactions  . Ace Inhibitors Cough  . Bentyl [Dicyclomine Hcl] Other (See Comments)    Blurry vision  . Penicillins Itching and Swelling    History  Substance Use Topics  . Smoking status: Current Every Day Smoker -- 1.00 packs/day for 35 years    Types: Cigarettes  . Smokeless tobacco: Never Used  .  Alcohol Use: No    Family History  Problem Relation Age of Onset  . Colon polyps Sister   . Cancer Mother     lung   . Cancer Father     stomach   . Stomach cancer Father     questionable  . Colon cancer Neg Hx      ROS We note that she complains of back pain, stiff joints, muscle weakness, burning pain in her legs. So we know that she does have some degree of spinal  stenosis as well. Objective:  Physical Exam Our office vital signs were 161/93. Patient sent for preoperative clearance and blood pressure management. Height 53. Weight 218. BMI 38.  Her appearance was otherwise normal in terms of development grooming and hygiene she was not in any acute distress. Her mood was normal. She had no significant gait abnormality  She had tenderness in the quadriceps tendon medial joint line lateral joint line but no effusion. She had extension weakness but no defect ligaments were stable knee flexion 132 skin had no defects his sensation was normal pulses are excellent.  Her other extremities were within normal limits neurovascular exam intact   Vital signs in last 24 hours:    Labs:   Estimated body mass index is 38.27 kg/(m^2) as calculated from the following:   Height as of 01/12/15: 5\' 3"  (1.6 m).   Weight as of 01/12/15: 216 lb (97.977 kg).   Imaging Review Plain radiographs demonstrate moderate degenerative joint disease of the left knee(s). The overall alignment ismild varus. The bone quality appears to be good for age and reported activity level.  Assessment/Plan:  End stage arthritis, left knee   The patient history, physical examination, clinical judgment of the provider and imaging studies are consistent with end stage degenerative joint disease of the left knee(s) and total knee arthroplasty is deemed medically necessary. The treatment options including medical management, injection therapy arthroscopy and arthroplasty were discussed at length. The risks and benefits of total knee arthroplasty were presented and reviewed. The risks due to aseptic loosening, infection, stiffness, patella tracking problems, thromboembolic complications and other imponderables were discussed. The patient acknowledged the explanation, agreed to proceed with the plan and consent was signed. Patient is being admitted for inpatient treatment for surgery, pain control, PT,  OT, prophylactic antibiotics, VTE prophylaxis, progressive ambulation and ADL's and discharge planning. The patient is planning to be discharged home with home health services

## 2015-03-12 ENCOUNTER — Encounter (HOSPITAL_COMMUNITY)
Admission: AD | Disposition: A | Discharge status: Home Health | Payer: Self-pay | Source: Ambulatory Visit | Attending: Orthopedic Surgery

## 2015-03-12 ENCOUNTER — Inpatient Hospital Stay (HOSPITAL_COMMUNITY): Payer: Medicare HMO

## 2015-03-12 ENCOUNTER — Inpatient Hospital Stay (HOSPITAL_COMMUNITY): Payer: Medicare HMO | Admitting: Anesthesiology

## 2015-03-12 ENCOUNTER — Encounter (HOSPITAL_COMMUNITY): Payer: Self-pay

## 2015-03-12 ENCOUNTER — Inpatient Hospital Stay (HOSPITAL_COMMUNITY)
Admission: AD | Admit: 2015-03-12 | Discharge: 2015-03-15 | DRG: 470 | Disposition: A | Discharge status: Home Health | Payer: Medicare HMO | Source: Ambulatory Visit | Attending: Orthopedic Surgery | Admitting: Orthopedic Surgery

## 2015-03-12 DIAGNOSIS — Z6838 Body mass index (BMI) 38.0-38.9, adult: Secondary | ICD-10-CM

## 2015-03-12 DIAGNOSIS — R112 Nausea with vomiting, unspecified: Secondary | ICD-10-CM | POA: Diagnosis not present

## 2015-03-12 DIAGNOSIS — Z888 Allergy status to other drugs, medicaments and biological substances status: Secondary | ICD-10-CM

## 2015-03-12 DIAGNOSIS — M129 Arthropathy, unspecified: Secondary | ICD-10-CM

## 2015-03-12 DIAGNOSIS — R739 Hyperglycemia, unspecified: Secondary | ICD-10-CM | POA: Diagnosis not present

## 2015-03-12 DIAGNOSIS — M2540 Effusion, unspecified joint: Secondary | ICD-10-CM | POA: Diagnosis present

## 2015-03-12 DIAGNOSIS — Z09 Encounter for follow-up examination after completed treatment for conditions other than malignant neoplasm: Secondary | ICD-10-CM

## 2015-03-12 DIAGNOSIS — M179 Osteoarthritis of knee, unspecified: Principal | ICD-10-CM | POA: Diagnosis present

## 2015-03-12 DIAGNOSIS — E669 Obesity, unspecified: Secondary | ICD-10-CM | POA: Diagnosis present

## 2015-03-12 DIAGNOSIS — Z880 Allergy status to penicillin: Secondary | ICD-10-CM | POA: Diagnosis not present

## 2015-03-12 DIAGNOSIS — M1710 Unilateral primary osteoarthritis, unspecified knee: Secondary | ICD-10-CM | POA: Diagnosis present

## 2015-03-12 DIAGNOSIS — M1712 Unilateral primary osteoarthritis, left knee: Secondary | ICD-10-CM | POA: Diagnosis not present

## 2015-03-12 DIAGNOSIS — F17210 Nicotine dependence, cigarettes, uncomplicated: Secondary | ICD-10-CM | POA: Diagnosis present

## 2015-03-12 HISTORY — PX: TOTAL KNEE ARTHROPLASTY: SHX125

## 2015-03-12 LAB — GLUCOSE, CAPILLARY
GLUCOSE-CAPILLARY: 235 mg/dL — AB (ref 65–99)
GLUCOSE-CAPILLARY: 384 mg/dL — AB (ref 65–99)
GLUCOSE-CAPILLARY: 410 mg/dL — AB (ref 65–99)
Glucose-Capillary: 237 mg/dL — ABNORMAL HIGH (ref 65–99)
Glucose-Capillary: 294 mg/dL — ABNORMAL HIGH (ref 65–99)

## 2015-03-12 LAB — GLUCOSE, RANDOM: Glucose, Bld: 408 mg/dL — ABNORMAL HIGH (ref 65–99)

## 2015-03-12 SURGERY — ARTHROPLASTY, KNEE, TOTAL
Anesthesia: General | Site: Knee | Laterality: Left

## 2015-03-12 MED ORDER — SUCCINYLCHOLINE CHLORIDE 20 MG/ML IJ SOLN
INTRAMUSCULAR | Status: AC
  Filled 2015-03-12: qty 1

## 2015-03-12 MED ORDER — DEXAMETHASONE SODIUM PHOSPHATE 4 MG/ML IJ SOLN
4.0000 mg | Freq: Once | INTRAMUSCULAR | Status: AC
  Administered 2015-03-12: 4 mg via INTRAVENOUS

## 2015-03-12 MED ORDER — DEXAMETHASONE SODIUM PHOSPHATE 4 MG/ML IJ SOLN
INTRAMUSCULAR | Status: AC
  Filled 2015-03-12: qty 1

## 2015-03-12 MED ORDER — PHENOL 1.4 % MT LIQD: 1.0000 | OROMUCOSAL | Status: DC | PRN

## 2015-03-12 MED ORDER — SODIUM CHLORIDE 0.9 % IJ SOLN
INTRAMUSCULAR | Status: AC
  Filled 2015-03-12: qty 40

## 2015-03-12 MED ORDER — ALLOPURINOL 300 MG PO TABS
300.0000 mg | ORAL_TABLET | Freq: Every day | ORAL | Status: DC
Start: 2015-03-12 — End: 2015-03-15
  Administered 2015-03-13 – 2015-03-15 (×3): 300 mg via ORAL
  Filled 2015-03-12 (×4): qty 1

## 2015-03-12 MED ORDER — MIDAZOLAM HCL 2 MG/2ML IJ SOLN
1.0000 mg | INTRAMUSCULAR | Status: DC | PRN
Start: 2015-03-12 — End: 2015-03-12
  Administered 2015-03-12: 2 mg via INTRAVENOUS

## 2015-03-12 MED ORDER — FENTANYL CITRATE (PF) 100 MCG/2ML IJ SOLN
INTRAMUSCULAR | Status: DC | PRN
  Administered 2015-03-12: 20 ug via INTRAVENOUS
  Administered 2015-03-12 (×2): 25 ug via INTRAVENOUS

## 2015-03-12 MED ORDER — VANCOMYCIN HCL IN DEXTROSE 1-5 GM/200ML-% IV SOLN
1000.0000 mg | INTRAVENOUS | Status: AC
  Administered 2015-03-12: 1000 mg via INTRAVENOUS

## 2015-03-12 MED ORDER — RISAQUAD PO CAPS
1.0000 | ORAL_CAPSULE | Freq: Every day | ORAL | Status: DC
  Administered 2015-03-12 – 2015-03-15 (×4): 1 via ORAL
  Filled 2015-03-12 (×4): qty 1

## 2015-03-12 MED ORDER — METHOCARBAMOL 500 MG PO TABS: 500.0000 mg | ORAL_TABLET | Freq: Four times a day (QID) | ORAL | Status: DC | PRN

## 2015-03-12 MED ORDER — KETOROLAC TROMETHAMINE 15 MG/ML IJ SOLN
15.0000 mg | Freq: Four times a day (QID) | INTRAMUSCULAR | Status: AC
  Administered 2015-03-13: 15 mg via INTRAVENOUS
  Filled 2015-03-12 (×4): qty 1

## 2015-03-12 MED ORDER — SIMVASTATIN 10 MG PO TABS
10.0000 mg | ORAL_TABLET | Freq: Every day | ORAL | Status: DC
  Administered 2015-03-12 – 2015-03-14 (×3): 10 mg via ORAL
  Filled 2015-03-12 (×3): qty 1

## 2015-03-12 MED ORDER — DEXAMETHASONE SODIUM PHOSPHATE 4 MG/ML IJ SOLN
10.0000 mg | Freq: Once | INTRAMUSCULAR | Status: DC
  Filled 2015-03-12 (×2): qty 3

## 2015-03-12 MED ORDER — ROCURONIUM BROMIDE 100 MG/10ML IV SOLN
INTRAVENOUS | Status: DC | PRN
  Administered 2015-03-12: 10 mg via INTRAVENOUS
  Administered 2015-03-12: 5 mg via INTRAVENOUS

## 2015-03-12 MED ORDER — PROPOFOL 10 MG/ML IV BOLUS
INTRAVENOUS | Status: AC
  Filled 2015-03-12: qty 20

## 2015-03-12 MED ORDER — ONDANSETRON HCL 4 MG/2ML IJ SOLN
4.0000 mg | Freq: Once | INTRAMUSCULAR | Status: AC
  Administered 2015-03-12: 4 mg via INTRAVENOUS

## 2015-03-12 MED ORDER — NEOSTIGMINE METHYLSULFATE 10 MG/10ML IV SOLN
INTRAVENOUS | Status: AC
  Filled 2015-03-12: qty 1

## 2015-03-12 MED ORDER — DOCUSATE SODIUM 100 MG PO CAPS
100.0000 mg | ORAL_CAPSULE | Freq: Two times a day (BID) | ORAL | Status: DC
  Administered 2015-03-12 – 2015-03-14 (×4): 100 mg via ORAL
  Filled 2015-03-12 (×7): qty 1

## 2015-03-12 MED ORDER — ONDANSETRON HCL 4 MG/2ML IJ SOLN
INTRAMUSCULAR | Status: AC
  Filled 2015-03-12: qty 2

## 2015-03-12 MED ORDER — LACTATED RINGERS IV SOLN
INTRAVENOUS | Status: DC
Start: 2015-03-12 — End: 2015-03-12
  Administered 2015-03-12: 07:00:00 via INTRAVENOUS

## 2015-03-12 MED ORDER — VANCOMYCIN HCL IN DEXTROSE 1-5 GM/200ML-% IV SOLN
1000.0000 mg | Freq: Two times a day (BID) | INTRAVENOUS | Status: AC
  Administered 2015-03-12: 1000 mg via INTRAVENOUS
  Filled 2015-03-12: qty 200

## 2015-03-12 MED ORDER — SODIUM CHLORIDE 0.9 % IJ SOLN
INTRAMUSCULAR | Status: AC
  Filled 2015-03-12: qty 10

## 2015-03-12 MED ORDER — METOCLOPRAMIDE HCL 10 MG PO TABS
5.0000 mg | ORAL_TABLET | Freq: Three times a day (TID) | ORAL | Status: DC | PRN
  Administered 2015-03-13: 10 mg via ORAL
  Filled 2015-03-12: qty 1

## 2015-03-12 MED ORDER — VANCOMYCIN HCL IN DEXTROSE 1-5 GM/200ML-% IV SOLN
INTRAVENOUS | Status: AC
  Filled 2015-03-12: qty 200

## 2015-03-12 MED ORDER — METHOCARBAMOL 1000 MG/10ML IJ SOLN
500.0000 mg | Freq: Four times a day (QID) | INTRAVENOUS | Status: DC | PRN
  Filled 2015-03-12: qty 5

## 2015-03-12 MED ORDER — ACETAMINOPHEN 500 MG PO TABS
500.0000 mg | ORAL_TABLET | Freq: Four times a day (QID) | ORAL | Status: DC
  Administered 2015-03-12 – 2015-03-15 (×8): 500 mg via ORAL
  Filled 2015-03-12 (×9): qty 1

## 2015-03-12 MED ORDER — PREGABALIN 50 MG PO CAPS
50.0000 mg | ORAL_CAPSULE | Freq: Once | ORAL | Status: AC
  Administered 2015-03-12: 50 mg via ORAL

## 2015-03-12 MED ORDER — ONDANSETRON HCL 4 MG/2ML IJ SOLN: 4.0000 mg | Freq: Once | INTRAMUSCULAR | Status: DC | PRN

## 2015-03-12 MED ORDER — VANCOMYCIN HCL IN DEXTROSE 1-5 GM/200ML-% IV SOLN: 1000.0000 mg | INTRAVENOUS | Status: DC

## 2015-03-12 MED ORDER — CELECOXIB 100 MG PO CAPS
200.0000 mg | ORAL_CAPSULE | Freq: Two times a day (BID) | ORAL | Status: DC
Start: 2015-03-13 — End: 2015-03-15
  Administered 2015-03-13 – 2015-03-15 (×2): 200 mg via ORAL
  Filled 2015-03-12 (×5): qty 2

## 2015-03-12 MED ORDER — ASPIRIN EC 325 MG PO TBEC
325.0000 mg | DELAYED_RELEASE_TABLET | Freq: Two times a day (BID) | ORAL | Status: DC
  Administered 2015-03-13 – 2015-03-15 (×4): 325 mg via ORAL
  Filled 2015-03-12 (×5): qty 1

## 2015-03-12 MED ORDER — MIDAZOLAM HCL 2 MG/2ML IJ SOLN
INTRAMUSCULAR | Status: AC
  Filled 2015-03-12: qty 2

## 2015-03-12 MED ORDER — PREGABALIN 50 MG PO CAPS
50.0000 mg | ORAL_CAPSULE | Freq: Three times a day (TID) | ORAL | Status: DC
  Administered 2015-03-13 (×2): 50 mg via ORAL
  Filled 2015-03-12 (×6): qty 1

## 2015-03-12 MED ORDER — INSULIN ASPART 100 UNIT/ML ~~LOC~~ SOLN
0.0000 [IU] | Freq: Three times a day (TID) | SUBCUTANEOUS | Status: DC
  Administered 2015-03-12: 15 [IU] via SUBCUTANEOUS
  Administered 2015-03-13: 8 [IU] via SUBCUTANEOUS
  Administered 2015-03-13: 5 [IU] via SUBCUTANEOUS
  Administered 2015-03-13: 2 [IU] via SUBCUTANEOUS
  Administered 2015-03-14: 5 [IU] via SUBCUTANEOUS
  Administered 2015-03-14: 3 [IU] via SUBCUTANEOUS
  Administered 2015-03-14: 5 [IU] via SUBCUTANEOUS
  Administered 2015-03-15: 2 [IU] via SUBCUTANEOUS
  Administered 2015-03-15: 3 [IU] via SUBCUTANEOUS

## 2015-03-12 MED ORDER — OXYCODONE HCL 5 MG PO TABS
ORAL_TABLET | ORAL | Status: AC
  Filled 2015-03-12: qty 1

## 2015-03-12 MED ORDER — SODIUM CHLORIDE 0.9 % IV SOLN
INTRAVENOUS | Status: DC
  Administered 2015-03-12: 14:00:00 via INTRAVENOUS

## 2015-03-12 MED ORDER — AMLODIPINE BESYLATE 5 MG PO TABS
10.0000 mg | ORAL_TABLET | Freq: Every day | ORAL | Status: DC
  Administered 2015-03-12 – 2015-03-15 (×4): 10 mg via ORAL
  Filled 2015-03-12 (×4): qty 2

## 2015-03-12 MED ORDER — PROPOFOL INFUSION 10 MG/ML OPTIME
INTRAVENOUS | Status: DC | PRN
  Administered 2015-03-12: 75 ug/kg/min via INTRAVENOUS

## 2015-03-12 MED ORDER — GLYCOPYRROLATE 0.2 MG/ML IJ SOLN
INTRAMUSCULAR | Status: DC | PRN
  Administered 2015-03-12: 0.6 mg via INTRAVENOUS
  Administered 2015-03-12: 0.2 mg via INTRAVENOUS

## 2015-03-12 MED ORDER — PHENYLEPHRINE HCL 10 MG/ML IJ SOLN
INTRAMUSCULAR | Status: DC | PRN
  Administered 2015-03-12 (×4): 40 ug via INTRAVENOUS

## 2015-03-12 MED ORDER — METOCLOPRAMIDE HCL 5 MG/ML IJ SOLN: 5.0000 mg | Freq: Three times a day (TID) | INTRAMUSCULAR | Status: DC | PRN

## 2015-03-12 MED ORDER — SODIUM CHLORIDE 0.9 % IR SOLN
Status: DC | PRN
  Administered 2015-03-12: 1000 mL
  Administered 2015-03-12: 3000 mL

## 2015-03-12 MED ORDER — TRANEXAMIC ACID 1000 MG/10ML IV SOLN
1000.0000 mg | INTRAVENOUS | Status: AC
  Administered 2015-03-12: 1000 mg via INTRAVENOUS
  Filled 2015-03-12: qty 10

## 2015-03-12 MED ORDER — CELECOXIB 400 MG PO CAPS
ORAL_CAPSULE | ORAL | Status: AC
  Filled 2015-03-12: qty 1

## 2015-03-12 MED ORDER — FENTANYL CITRATE (PF) 100 MCG/2ML IJ SOLN
25.0000 ug | INTRAMUSCULAR | Status: DC
  Administered 2015-03-12: 25 ug via INTRAVENOUS

## 2015-03-12 MED ORDER — OXYCODONE HCL 5 MG PO TABS
5.0000 mg | ORAL_TABLET | Freq: Once | ORAL | Status: AC
  Administered 2015-03-12: 5 mg via ORAL

## 2015-03-12 MED ORDER — INSULIN ASPART 100 UNIT/ML ~~LOC~~ SOLN
0.0000 [IU] | Freq: Every day | SUBCUTANEOUS | Status: DC
  Administered 2015-03-12: 5 [IU] via SUBCUTANEOUS
  Administered 2015-03-13 – 2015-03-14 (×2): 2 [IU] via SUBCUTANEOUS

## 2015-03-12 MED ORDER — LIDOCAINE HCL (PF) 1 % IJ SOLN
INTRAMUSCULAR | Status: AC
  Filled 2015-03-12: qty 5

## 2015-03-12 MED ORDER — FENTANYL CITRATE (PF) 100 MCG/2ML IJ SOLN
INTRAMUSCULAR | Status: AC
Start: 2015-03-12 — End: 2015-03-12
  Filled 2015-03-12: qty 2

## 2015-03-12 MED ORDER — ALBUTEROL SULFATE HFA 108 (90 BASE) MCG/ACT IN AERS
INHALATION_SPRAY | RESPIRATORY_TRACT | Status: DC | PRN
  Administered 2015-03-12: 5 via RESPIRATORY_TRACT

## 2015-03-12 MED ORDER — NEOSTIGMINE METHYLSULFATE 10 MG/10ML IV SOLN
INTRAVENOUS | Status: DC | PRN
  Administered 2015-03-12: 4 mg via INTRAVENOUS

## 2015-03-12 MED ORDER — SUCCINYLCHOLINE CHLORIDE 20 MG/ML IJ SOLN
INTRAMUSCULAR | Status: DC | PRN
  Administered 2015-03-12: 100 mg via INTRAVENOUS

## 2015-03-12 MED ORDER — FENTANYL CITRATE (PF) 100 MCG/2ML IJ SOLN
INTRAMUSCULAR | Status: AC
  Filled 2015-03-12: qty 4

## 2015-03-12 MED ORDER — CELECOXIB 400 MG PO CAPS
400.0000 mg | ORAL_CAPSULE | Freq: Once | ORAL | Status: AC
  Administered 2015-03-12: 400 mg via ORAL

## 2015-03-12 MED ORDER — CHLORHEXIDINE GLUCONATE 4 % EX LIQD: 60.0000 mL | Freq: Once | CUTANEOUS | Status: DC

## 2015-03-12 MED ORDER — SODIUM CHLORIDE 0.9 % IV SOLN
INTRAVENOUS | Status: DC | PRN
  Administered 2015-03-12: 60 mL

## 2015-03-12 MED ORDER — BUPIVACAINE-EPINEPHRINE (PF) 0.5% -1:200000 IJ SOLN
INTRAMUSCULAR | Status: AC
  Filled 2015-03-12: qty 30

## 2015-03-12 MED ORDER — METHOCARBAMOL 1000 MG/10ML IJ SOLN
500.0000 mg | Freq: Once | INTRAVENOUS | Status: AC
  Administered 2015-03-12: 500 mg via INTRAVENOUS
  Filled 2015-03-12: qty 5

## 2015-03-12 MED ORDER — PREGABALIN 50 MG PO CAPS
ORAL_CAPSULE | ORAL | Status: AC
  Filled 2015-03-12: qty 1

## 2015-03-12 MED ORDER — GLYCOPYRROLATE 0.2 MG/ML IJ SOLN
INTRAMUSCULAR | Status: AC
  Filled 2015-03-12: qty 3

## 2015-03-12 MED ORDER — HYDROMORPHONE HCL 1 MG/ML IJ SOLN
0.5000 mg | INTRAMUSCULAR | Status: DC | PRN
  Administered 2015-03-12: 0.5 mg via INTRAVENOUS
  Filled 2015-03-12 (×3): qty 1

## 2015-03-12 MED ORDER — LACTATED RINGERS IV SOLN
INTRAVENOUS | Status: DC | PRN
  Administered 2015-03-12 (×3): via INTRAVENOUS

## 2015-03-12 MED ORDER — DEXTROSE 5 % IV SOLN
INTRAVENOUS | Status: DC | PRN
  Administered 2015-03-12: 08:00:00 via INTRAVENOUS

## 2015-03-12 MED ORDER — BUPIVACAINE LIPOSOME 1.3 % IJ SUSP
20.0000 mL | Freq: Once | INTRAMUSCULAR | Status: DC
  Filled 2015-03-12: qty 20

## 2015-03-12 MED ORDER — ALBUTEROL SULFATE HFA 108 (90 BASE) MCG/ACT IN AERS
INHALATION_SPRAY | RESPIRATORY_TRACT | Status: AC
  Filled 2015-03-12: qty 6.7

## 2015-03-12 MED ORDER — ALUM & MAG HYDROXIDE-SIMETH 200-200-20 MG/5ML PO SUSP
30.0000 mL | ORAL | Status: DC | PRN
  Filled 2015-03-12: qty 30

## 2015-03-12 MED ORDER — POLYETHYLENE GLYCOL 3350 17 G PO PACK
17.0000 g | PACK | Freq: Every day | ORAL | Status: DC
  Administered 2015-03-13 – 2015-03-14 (×2): 17 g via ORAL
  Filled 2015-03-12 (×4): qty 1

## 2015-03-12 MED ORDER — DIPHENHYDRAMINE HCL 12.5 MG/5ML PO ELIX: 12.5000 mg | ORAL_SOLUTION | ORAL | Status: DC | PRN

## 2015-03-12 MED ORDER — ROCURONIUM BROMIDE 50 MG/5ML IV SOLN
INTRAVENOUS | Status: AC
  Filled 2015-03-12: qty 1

## 2015-03-12 MED ORDER — ONDANSETRON HCL 4 MG/2ML IJ SOLN
INTRAMUSCULAR | Status: AC
Start: 2015-03-12 — End: 2015-03-12
  Filled 2015-03-12: qty 2

## 2015-03-12 MED ORDER — BUPIVACAINE IN DEXTROSE 0.75-8.25 % IT SOLN
INTRATHECAL | Status: AC
  Filled 2015-03-12: qty 2

## 2015-03-12 MED ORDER — MENTHOL 3 MG MT LOZG
1.0000 | LOZENGE | OROMUCOSAL | Status: DC | PRN
Start: 2015-03-12 — End: 2015-03-15
  Administered 2015-03-14: 3 mg via ORAL
  Filled 2015-03-12: qty 9

## 2015-03-12 MED ORDER — PROPOFOL 10 MG/ML IV BOLUS
INTRAVENOUS | Status: DC | PRN
  Administered 2015-03-12: 100 mg via INTRAVENOUS

## 2015-03-12 MED ORDER — PANTOPRAZOLE SODIUM 40 MG PO TBEC
40.0000 mg | DELAYED_RELEASE_TABLET | Freq: Every day | ORAL | Status: DC
  Administered 2015-03-13 – 2015-03-15 (×3): 40 mg via ORAL
  Filled 2015-03-12 (×3): qty 1

## 2015-03-12 MED ORDER — BUPIVACAINE IN DEXTROSE 0.75-8.25 % IT SOLN
INTRATHECAL | Status: DC | PRN
  Administered 2015-03-12: 14 mg via INTRATHECAL

## 2015-03-12 MED ORDER — OXYCODONE HCL 5 MG PO TABS
10.0000 mg | ORAL_TABLET | ORAL | Status: DC
  Administered 2015-03-12 – 2015-03-15 (×13): 10 mg via ORAL
  Filled 2015-03-12 (×14): qty 2

## 2015-03-12 MED ORDER — EPHEDRINE SULFATE 50 MG/ML IJ SOLN
INTRAMUSCULAR | Status: AC
  Filled 2015-03-12: qty 1

## 2015-03-12 MED ORDER — BUPIVACAINE LIPOSOME 1.3 % IJ SUSP
INTRAMUSCULAR | Status: AC
  Filled 2015-03-12: qty 20

## 2015-03-12 MED ORDER — ONDANSETRON HCL 4 MG PO TABS
4.0000 mg | ORAL_TABLET | Freq: Four times a day (QID) | ORAL | Status: DC | PRN
  Administered 2015-03-13: 4 mg via ORAL
  Filled 2015-03-12: qty 1

## 2015-03-12 MED ORDER — LOSARTAN POTASSIUM 50 MG PO TABS
100.0000 mg | ORAL_TABLET | Freq: Every day | ORAL | Status: DC
  Administered 2015-03-12 – 2015-03-15 (×4): 100 mg via ORAL
  Filled 2015-03-12 (×4): qty 2

## 2015-03-12 MED ORDER — MIDAZOLAM HCL 2 MG/2ML IJ SOLN
INTRAMUSCULAR | Status: AC
  Filled 2015-03-12: qty 4

## 2015-03-12 MED ORDER — BUPIVACAINE-EPINEPHRINE (PF) 0.5% -1:200000 IJ SOLN
INTRAMUSCULAR | Status: DC | PRN
  Administered 2015-03-12: 30 mL

## 2015-03-12 MED ORDER — ONDANSETRON HCL 4 MG/2ML IJ SOLN
4.0000 mg | Freq: Four times a day (QID) | INTRAMUSCULAR | Status: DC | PRN
  Filled 2015-03-12: qty 2

## 2015-03-12 MED ORDER — METFORMIN HCL ER 500 MG PO TB24
500.0000 mg | ORAL_TABLET | Freq: Once | ORAL | Status: AC
Start: 2015-03-12 — End: 2015-03-12
  Administered 2015-03-12: 500 mg via ORAL
  Filled 2015-03-12: qty 1

## 2015-03-12 MED ORDER — DEXAMETHASONE SODIUM PHOSPHATE 4 MG/ML IJ SOLN
INTRAMUSCULAR | Status: DC | PRN
  Administered 2015-03-12: 4 mg via INTRAVENOUS

## 2015-03-12 MED ORDER — FENTANYL CITRATE (PF) 100 MCG/2ML IJ SOLN
25.0000 ug | INTRAMUSCULAR | Status: DC | PRN
  Administered 2015-03-12 (×2): 50 ug via INTRAVENOUS
  Filled 2015-03-12 (×2): qty 2

## 2015-03-12 MED ORDER — METFORMIN HCL ER 500 MG PO TB24
1000.0000 mg | ORAL_TABLET | Freq: Every day | ORAL | Status: DC
  Administered 2015-03-13 – 2015-03-15 (×3): 1000 mg via ORAL
  Filled 2015-03-12 (×3): qty 2

## 2015-03-12 SURGICAL SUPPLY — 72 items
BAG HAMPER (MISCELLANEOUS) ×2 IMPLANT
BANDAGE ESMARK 6X9 LF (GAUZE/BANDAGES/DRESSINGS) ×1 IMPLANT
BIT DRILL 3.2X128 (BIT) ×1 IMPLANT
BLADE HEX COATED 2.75 (ELECTRODE) ×2 IMPLANT
BNDG CMPR 9X6 STRL LF SNTH (GAUZE/BANDAGES/DRESSINGS) ×1
BNDG ESMARK 6X9 LF (GAUZE/BANDAGES/DRESSINGS) ×2
BOWL SMART MIX CTS (DISPOSABLE) IMPLANT
CAP KNEE TOTAL 3 SIGMA ×1 IMPLANT
CEMENT HV SMART SET (Cement) ×4 IMPLANT
CLOTH BEACON ORANGE TIMEOUT ST (SAFETY) ×2 IMPLANT
COOLER CRYO CUFF IC AND MOTOR (MISCELLANEOUS) ×2 IMPLANT
COVER LIGHT HANDLE STERIS (MISCELLANEOUS) ×4 IMPLANT
COVER PROBE W GEL 5X96 (DRAPES) ×2 IMPLANT
CUFF CRYO KNEE LG 20X31 COOLER (ORTHOPEDIC SUPPLIES) ×1 IMPLANT
CUFF CRYO KNEE18X23 MED (MISCELLANEOUS) ×2 IMPLANT
CUFF TOURNIQUET SINGLE 34IN LL (TOURNIQUET CUFF) ×1 IMPLANT
CUFF TOURNIQUET SINGLE 44IN (TOURNIQUET CUFF) IMPLANT
DECANTER SPIKE VIAL GLASS SM (MISCELLANEOUS) ×4 IMPLANT
DRAPE BACK TABLE (DRAPES) ×2 IMPLANT
DRAPE EXTREMITY T 121X128X90 (DRAPE) ×2 IMPLANT
DRSG AQUACEL AG ADV 3.5X10 (GAUZE/BANDAGES/DRESSINGS) ×1 IMPLANT
DRSG MEPILEX BORDER 4X12 (GAUZE/BANDAGES/DRESSINGS) ×1 IMPLANT
DURAPREP 26ML APPLICATOR (WOUND CARE) ×4 IMPLANT
ELECT REM PT RETURN 9FT ADLT (ELECTROSURGICAL) ×2
ELECTRODE REM PT RTRN 9FT ADLT (ELECTROSURGICAL) ×1 IMPLANT
EVACUATOR 3/16  PVC DRAIN (DRAIN) ×1
EVACUATOR 3/16 PVC DRAIN (DRAIN) ×1 IMPLANT
GLOVE BIO SURGEON STRL SZ 6.5 (GLOVE) ×3 IMPLANT
GLOVE BIOGEL PI IND STRL 7.0 (GLOVE) IMPLANT
GLOVE BIOGEL PI IND STRL 7.5 (GLOVE) IMPLANT
GLOVE BIOGEL PI INDICATOR 7.0 (GLOVE) ×5
GLOVE BIOGEL PI INDICATOR 7.5 (GLOVE) ×1
GLOVE OPTIFIT SS 8.0 STRL (GLOVE) ×2 IMPLANT
GLOVE SKINSENSE NS SZ8.0 LF (GLOVE) ×2
GLOVE SKINSENSE STRL SZ8.0 LF (GLOVE) ×2 IMPLANT
GLOVE SS N UNI LF 8.5 STRL (GLOVE) ×2 IMPLANT
GOWN STRL REUS W/ TWL LRG LVL3 (GOWN DISPOSABLE) ×1 IMPLANT
GOWN STRL REUS W/TWL LRG LVL3 (GOWN DISPOSABLE) ×5 IMPLANT
GOWN STRL REUS W/TWL XL LVL3 (GOWN DISPOSABLE) ×2 IMPLANT
HANDPIECE INTERPULSE COAX TIP (DISPOSABLE) ×2
HOOD W/PEELAWAY (MISCELLANEOUS) ×8 IMPLANT
INST SET MAJOR BONE (KITS) ×2 IMPLANT
IV NS IRRIG 3000ML ARTHROMATIC (IV SOLUTION) ×2 IMPLANT
KIT BLADEGUARD II DBL (SET/KITS/TRAYS/PACK) ×2 IMPLANT
KIT ROOM TURNOVER APOR (KITS) ×2 IMPLANT
MANIFOLD NEPTUNE II (INSTRUMENTS) ×2 IMPLANT
MARKER SKIN DUAL TIP RULER LAB (MISCELLANEOUS) ×2 IMPLANT
NDL HYPO 21X1.5 SAFETY (NEEDLE) ×1 IMPLANT
NDL HYPO 25X1 1.5 SAFETY (NEEDLE) ×1 IMPLANT
NEEDLE HYPO 21X1.5 SAFETY (NEEDLE) IMPLANT
NEEDLE HYPO 25X1 1.5 SAFETY (NEEDLE) ×2 IMPLANT
NS IRRIG 1000ML POUR BTL (IV SOLUTION) ×2 IMPLANT
PACK TOTAL JOINT (CUSTOM PROCEDURE TRAY) ×2 IMPLANT
PAD ARMBOARD 7.5X6 YLW CONV (MISCELLANEOUS) ×2 IMPLANT
PAD DANNIFLEX CPM (ORTHOPEDIC SUPPLIES) ×2 IMPLANT
PIN TROCAR 3 INCH (PIN) ×2 IMPLANT
SAW OSC TIP CART 19.5X105X1.3 (SAW) ×3 IMPLANT
SET BASIN LINEN APH (SET/KITS/TRAYS/PACK) ×2 IMPLANT
SET HNDPC FAN SPRY TIP SCT (DISPOSABLE) ×1 IMPLANT
STAPLER VISISTAT 35W (STAPLE) ×2 IMPLANT
SUT BRALON NAB BRD #1 30IN (SUTURE) ×4 IMPLANT
SUT MON AB 0 CT1 (SUTURE) ×3 IMPLANT
SUT MON AB 2-0 CT1 36 (SUTURE) IMPLANT
SYR 20CC LL (SYRINGE) ×3 IMPLANT
SYR 30ML LL (SYRINGE) ×3 IMPLANT
SYR BULB IRRIGATION 50ML (SYRINGE) ×3 IMPLANT
TAPE CLOTH SURG 4X10 WHT LF (GAUZE/BANDAGES/DRESSINGS) ×1 IMPLANT
TOWEL OR 17X26 4PK STRL BLUE (TOWEL DISPOSABLE) ×2 IMPLANT
TOWER CARTRIDGE SMART MIX (DISPOSABLE) ×1 IMPLANT
TRAY FOLEY CATH SILVER 16FR (SET/KITS/TRAYS/PACK) ×2 IMPLANT
WATER STERILE IRR 1000ML POUR (IV SOLUTION) ×6 IMPLANT
YANKAUER SUCT 12FT TUBE ARGYLE (SUCTIONS) ×2 IMPLANT

## 2015-03-12 NOTE — Evaluation (Signed)
Occupational Therapy Evaluation Patient Details Name: Stephanie Hammond MRN: [id number] DOB: 21-Dec-1950 Today's Date: 03/12/2015    History of Present Illness Pt is a 64 y.o. female, has a history of pain and functional disability in the left knee due to arthritis and has failed non-surgical conservative treatments for greater than 12 weeks to includeNSAID's and/or analgesics, use of assistive devices and activity modification. Onset of symptoms was gradual, starting several years ago  years ago with gradually worsening course since that time. The patient noted no past surgery on the left knee(s). Patient currently rates pain in the left knee(s) at 6 out of 10 with activity. Patient has night pain, worsening of pain with activity and weight bearing, pain that interferes with activities of daily living, pain with passive range of motion, crepitus and joint swelling. Patient has evidence of subchondral cysts, subchondral sclerosis, periarticular osteophytes and joint space narrowing by imaging studies. There is no active infection   Clinical Impression   Pt awake and alert this afternoon. Pt reports pain as 10/10, even with ice and pain medication, therefore did not mobilize pt in bed. Pt reports she is fully aware of recovery, due to having right TKA in 2009. Pt husband is with patient all day every day and will be assisting with B/IADL tasks as needed. Pt demonstrates BUE range of motion and strength WNL. No further OT services required, as pt has great support system to assist with BADL tasks during recovery phase.       Follow Up Recommendations  No OT follow up;Supervision/Assistance - 24 hour    Equipment Recommendations  None recommended by OT           Mobility Bed Mobility               General bed mobility comments: Unable to assess, due to pain  Transfers                 General transfer comment: unable to assess, due to pain         ADL Overall ADL's :  Needs assistance/impaired;At baseline                                       General ADL Comments: Pt is pain limited during evaluation.      Vision Vision Assessment?: No apparent visual deficits          Pertinent Vitals/Pain Pain Assessment: 0-10 Pain Score: 10-Worst pain ever Pain Location: Left knee Pain Descriptors / Indicators: Aching;Constant Pain Intervention(s): Limited activity within patient's tolerance;Monitored during session     Hand Dominance Right   Extremity/Trunk Assessment Upper Extremity Assessment Upper Extremity Assessment: Overall WFL for tasks assessed   Lower Extremity Assessment Lower Extremity Assessment: Defer to PT evaluation       Communication Communication Communication: No difficulties   Cognition Arousal/Alertness: Awake/alert Behavior During Therapy: WFL for tasks assessed/performed Overall Cognitive Status: Within Functional Limits for tasks assessed                                Home Living Family/patient expects to be discharged to:: Private residence Living Arrangements: Spouse/significant other Available Help at Discharge: Family;Available 24 hours/day Type of Home: House             Bathroom Shower/Tub: Chief Strategy Officer: Standard  Home Equipment: Walker - 2 wheels;Bedside commode          Prior Functioning/Environment Level of Independence: Independent             OT Diagnosis: Acute pain    End of Session    Activity Tolerance: Patient limited by pain Patient left: in bed;with call bell/phone within reach;with family/visitor present   Time: 2841-3244 OT Time Calculation (min): 12 min Charges:  OT General Charges $OT Visit: 1 Procedure OT Evaluation $Initial OT Evaluation Tier I: 1 Procedure  Ezra Sites, OTR/L  [phone]  03/12/2015, 2:54 PM

## 2015-03-12 NOTE — Anesthesia Postprocedure Evaluation (Signed)
  Anesthesia Post-op Note  Patient: Stephanie Hammond  Procedure(s) Performed: Procedure(s): LEFT TOTAL KNEE REPLACEMENT (Left)  Patient Location: PACU  Anesthesia Type:General  Level of Consciousness: awake, alert , oriented and patient cooperative  Airway and Oxygen Therapy: Patient Spontanous Breathing and Patient connected to face mask oxygen  Post-op Pain: none  Post-op Assessment: Post-op Vital signs reviewed, Patient's Cardiovascular Status Stable, Respiratory Function Stable, Patent Airway, No signs of Nausea or vomiting and Pain level controlled              Post-op Vital Signs: Reviewed and stable  Last Vitals:  Filed Vitals:   03/12/15 1017  BP:   Pulse: 100  Temp: 36.6 C  Resp: 16    Complications: No apparent anesthesia complications

## 2015-03-12 NOTE — Clinical Social Work Note (Signed)
CSW received referral for possible SNF. Met with pt at bedside along with son. Pt states she had other knee done in past and knows what to expect. She plans to return home with home health at d/c. Pt indicates her husband will be with her when she returns home. CSW will sign off, but can be reconsulted if needed.  Benay Pike, [place]

## 2015-03-12 NOTE — Interval H&P Note (Signed)
History and Physical Interval Note:  03/12/2015 7:29 AM  [name]  has presented today for surgery, with the diagnosis of left knee osteoarthritis  The various methods of treatment have been discussed with the patient and family. After consideration of risks, benefits and other options for treatment, the patient has consented to  Procedure(s): TOTAL KNEE ARTHROPLASTY (Left) as a surgical intervention .  The patient's history has been reviewed, patient examined, no change in status, stable for surgery.  I have reviewed the patient's chart and labs.  Questions were answered to the patient's satisfaction.     Arther Abbott

## 2015-03-12 NOTE — Anesthesia Procedure Notes (Addendum)
Spinal Patient location during procedure: OR Staffing Resident/CRNA: ADAMS, AMY A Preanesthetic Checklist Completed: patient identified, site marked, surgical consent, pre-op evaluation, timeout performed, IV checked, risks and benefits discussed and monitors and equipment checked Spinal Block Patient position: left lateral decubitus Prep: Betadine Patient monitoring: heart rate, cardiac monitor, continuous pulse ox and blood pressure Approach: left paramedian Location: L3-4 Injection technique: single-shot Needle Needle type: Spinocan  Needle gauge: 22 G Needle length: 12.7 cm Assessment Sensory level: T8 Additional Notes  ATTEMPTS:2 TRAY JT:[id number] TRAY EXPIRATION DATE:02/2016  Procedure Name: MAC Date/Time: 03/12/2015 7:34 AM Performed by: Andree Elk, AMY A Pre-anesthesia Checklist: Patient identified, Timeout performed, Emergency Drugs available, Suction available and Patient being monitored Oxygen Delivery Method: Simple face mask    Procedure Name: Intubation Date/Time: 03/12/2015 8:26 AM Performed by: Andree Elk, AMY A Pre-anesthesia Checklist: Patient identified, Patient being monitored, Timeout performed, Emergency Drugs available and Suction available Patient Re-evaluated:Patient Re-evaluated prior to inductionOxygen Delivery Method: Circle System Utilized Preoxygenation: Pre-oxygenation with 100% oxygen Intubation Type: IV induction, Rapid sequence and Cricoid Pressure applied Laryngoscope Size: 3 and Miller Grade View: Grade I Tube type: Oral Tube size: 7.0 mm Number of attempts: 1 Airway Equipment and Method: Stylet Placement Confirmation: ETT inserted through vocal cords under direct vision,  positive ETCO2 and breath sounds checked- equal and bilateral Secured at: 21 cm Tube secured with: Tape Dental Injury: Teeth and Oropharynx as per pre-operative assessment

## 2015-03-12 NOTE — Transfer of Care (Signed)
Immediate Anesthesia Transfer of Care Note  Patient: Stephanie Hammond  Procedure(s) Performed: Procedure(s): LEFT TOTAL KNEE REPLACEMENT (Left)  Patient Location: PACU  Anesthesia Type:General  Level of Consciousness: awake, alert , oriented and patient cooperative  Airway & Oxygen Therapy: Patient Spontanous Breathing and Patient connected to face mask oxygen  Post-op Assessment: Report given to RN and Post -op Vital signs reviewed and stable  Post vital signs: Reviewed and stable  Last Vitals:  Filed Vitals:   03/12/15 0658  BP: 130/76  Pulse: 122  Temp: 36.8 C  Resp: 22    Complications: No apparent anesthesia complications

## 2015-03-12 NOTE — Anesthesia Preprocedure Evaluation (Signed)
Anesthesia Evaluation  Patient identified by MRN, date of birth, ID band Patient awake    Reviewed: Allergy & Precautions, NPO status , Patient's Chart, lab work & pertinent test results  Airway Mallampati: II  TM Distance: >3 FB     Dental  (+) Teeth Intact, Dental Advisory Given   Pulmonary Current Smoker,  breath sounds clear to auscultation        Cardiovascular hypertension, Pt. on medications - angina+ CAD Rhythm:Regular Rate:Normal     Neuro/Psych PSYCHIATRIC DISORDERS Depression Back pain with left-sided radiculopathy  Neuromuscular disease    GI/Hepatic GERD-  Controlled and Medicated,  Endo/Other  diabetes, Type 2, Oral Hypoglycemic Agents  Renal/GU      Musculoskeletal  (+) Arthritis -,   Abdominal   Peds  Hematology  (+) anemia ,   Anesthesia Other Findings   Reproductive/Obstetrics                             Anesthesia Physical Anesthesia Plan  ASA: III  Anesthesia Plan: Spinal   Post-op Pain Management:    Induction:   Airway Management Planned: Simple Face Mask  Additional Equipment:   Intra-op Plan:   Post-operative Plan:   Informed Consent: I have reviewed the patients History and Physical, chart, labs and discussed the procedure including the risks, benefits and alternatives for the proposed anesthesia with the patient or authorized representative who has indicated his/her understanding and acceptance.     Plan Discussed with:   Anesthesia Plan Comments:         Anesthesia Quick Evaluation

## 2015-03-12 NOTE — Progress Notes (Signed)
Inpatient Diabetes Program Recommendations  AACE/ADA: New Consensus Statement on Inpatient Glycemic Control (2013)  Target Ranges:  Prepandial:   less than 140 mg/dL      Peak postprandial:   less than 180 mg/dL (1-2 hours)      Critically ill patients:  140 - 180 mg/dL   Results for [name], Stephanie Hammond (MRN [id number]) as of 03/12/2015 12:07  Ref. Range 03/12/2015 07:01 03/12/2015 10:21  Glucose-Capillary Latest Ref Range: 65-99 mg/dL 237 (H) 235 (H)    Diabetes history: DM2 Outpatient Diabetes medications: Metformin XR 1000 mg QAM with breakfast Current orders for Inpatient glycemic control: Metformin XR 1000 mg QAM with breakfast  Inpatient Diabetes Program Recommendations Correction (SSI): Patient has a history of diabetes and takes Metformin as an outpatient. Noted patient has recieved 2 doses of Decadron 4 mg today which is contributing to hyperglycemiia. While inpatient please order CBGs with Novolog correction scale ACHS.  Thanks, Barnie Alderman, RN, MSN, CCRN, CDE Diabetes Coordinator Inpatient Diabetes Program [phone] (Team Pager from [place] to [place]) [phone] ([place] office) [phone] [facility] office) [phone] [facility] office)

## 2015-03-12 NOTE — Op Note (Signed)
03/12/2015  Surgical dictation for left total knee  Preop diagnosis osteoarthritis left knee  Postop diagnosis osteoarthritis left knee3  Surgeon Dr. Aline Brochure [phone]  Assisted by Simonne Maffucci and [facility]  Anesthetic spinal also converted to general anesthesia due to patient coughing  Implants DEPUY  SIGMA PS FB   SIZES:    F 2   T sized to   P size 38  Poly size 15  Drains: one Hemovac drain in the joint   Exparel   Marcaine with epinephrine    Operative findings : The patient had medial lateral arthritis of the compartments primarily on the medial femoral condyles with grade 2 chondral loss she had multiple osteophytes around the joint she had a moderate varus deformity and a small flexion contracture.    details of procedure:   The patient was identified in the preop holding area and the surgical site was confirmed as the left knee. Chart review and update were completed. The patient was taken to the operating room for spinal anesthesia. After successful spinal anesthesia Foley catheter was inserted. The patient was placed supine on the operating table.   the left leg was prepped with ChloraPrep and draped sterilely. Timeout was completed. The limb was then exsanguinated a  6 inch Esmarch. The tourniquet was elevated to 300 mmHg.   A midline incision was made and taken down to the extensor mechanism followed by medial arthrotomy. The patella was everted. Synovial resection was performed as needed. The osteophytes were resected.  Anterior cruciate ligament and PCL and medial and lateral meniscus were resected.   a 3/8 inch drill bit was used to enter the femoral canal which was suctioned and irrigated until the fluid was clear. The distal femoral cut was set for 11 millimeter resection with a 5   Left Valgus angle. This cut was completed and checked for flatness.   the femur was then measured to a size between a 2 and a 3.  The cutting block was placed to match the  epicondyles and the 4 distal cuts were made.   the tibia was subluxated forward and the external alignment guide was placed. We removed 8 mm of bone from the higher lateral side. We set the guide for neutral varus valgus cut related to the  Mechanical axis of the tibia and for slope matching the patient's anatomy. Rotational alignment was set using the tibial tubercle, tibial spine and second metatarsal. The cutting block was pinned and the proximal tibia was resected.    spacer blocks were placed starting with a 10 mm insert to confirm equal flexion-extension gaps. A size  15  mm insert balanced the gaps.   We placed the femoral notch cutting guide size 2  and resected the notch.   Trial implants replaced using appropriate size femur , appropriate size tibial baseplate which was measured after the proximal tibia resection. Tibial rotation was set patella tracking was normal   The tibia was then punched per manufacture technique making sure to avoid internal rotation.   The patella measured a size 21   We resected down to a size 14 using a size 38 x 8.5 button.   Final range of motion check was performed with the appropriate size trials as mentioned above. Satisfactory reduction and motion were obtained.   Trial implants were removed. The bone was irrigated and dried and the cement was mixed on the back table. The exparel was mixed and divided doses and injected around the knee  joint including the posterior capsule. Marcaine was injected once the extensor mechanism was closed  These implants were then cemented in place. Excess cement was removed. The cement was allowed to cure. Second irrigation was performed.    FInal range of motion check and stability check was completed  The wound was irrigated third time Hemovac drain was placed, extensor mechanism was closed with #1 Nurolon followed by 0 Monocryl and staples to reapproximate the skin edges and subcutaneous tissue.   Sterile dressing was  applied  The patient was taken recovery in stable condition

## 2015-03-12 NOTE — Interval H&P Note (Signed)
History and Physical Interval Note:  03/12/2015 7:29 AM  Stephanie Hammond  has presented today for surgery, with the diagnosis of left knee osteoarthritis  The various methods of treatment have been discussed with the patient and family. After consideration of risks, benefits and other options for treatment, the patient has consented to  Procedure(s): TOTAL KNEE ARTHROPLASTY (Left) as a surgical intervention .  The patient's history has been reviewed, patient examined, no change in status, stable for surgery.  I have reviewed the patient's chart and labs.  Questions were answered to the patient's satisfaction.     Arther Abbott

## 2015-03-12 NOTE — Brief Op Note (Signed)
03/12/2015  10:07 AM  PATIENT:  Stephanie Hammond  64 y.o. female  PRE-OPERATIVE DIAGNOSIS:  left knee osteoarthritis  POST-OPERATIVE DIAGNOSIS:  left knee osteoarthritis  PROCEDURE:  Procedure(s): LEFT TOTAL KNEE REPLACEMENT (Left)  SURGEON:  Surgeon(s) and Role:    * Carole Civil, MD - Primary  PHYSICIAN ASSISTANT:   ASSISTANTS: betty ashley and debbie dallas   ANESTHESIA:   spinal and general  EBL:  Total I/O In: 2200 [I.V.:2200] Out: 60 [Urine:50; Blood:10]  BLOOD ADMINISTERED:none  DRAINS: 1 hemovac   LOCAL MEDICATIONS USED:  MARCAINE   , Amount: 30 with epi in the joint  ml and OTHER exparel 266mg   SPECIMEN:  No Specimen  DISPOSITION OF SPECIMEN:  N/A  COUNTS:  YES  TOURNIQUET:   Total Tourniquet Time Documented: Thigh (Left) - 87 minutes Total: Thigh (Left) - 87 minutes   DICTATION: .Viviann Spare Dictation  PLAN OF CARE: Admit to inpatient   PATIENT DISPOSITION:  PACU - hemodynamically stable.   Delay start of Pharmacological VTE agent (>24hrs) due to surgical blood loss or risk of bleeding: not applicable

## 2015-03-13 LAB — BASIC METABOLIC PANEL
Anion gap: 9 (ref 5–15)
BUN: 17 mg/dL (ref 6–20)
CALCIUM: 8.9 mg/dL (ref 8.9–10.3)
CO2: 27 mmol/L (ref 22–32)
Chloride: 103 mmol/L (ref 101–111)
Creatinine, Ser: 0.82 mg/dL (ref 0.44–1.00)
GFR calc Af Amer: >60 mL/min (ref >60)
GFR calc non Af Amer: >60 mL/min (ref >60)
Glucose, Bld: 225 mg/dL — ABNORMAL HIGH (ref 65–99)
Potassium: 4.6 mmol/L (ref 3.5–5.1)
Sodium: 139 mmol/L (ref 135–145)

## 2015-03-13 LAB — CBC
HCT: 31 % — ABNORMAL LOW (ref 36.0–46.0)
Hemoglobin: 11.2 g/dL — ABNORMAL LOW (ref 12.0–15.0)
MCH: 31.9 pg (ref 26.0–34.0)
MCHC: 36.1 g/dL — AB (ref 30.0–36.0)
MCV: 88.3 fL (ref 78.0–100.0)
Platelets: 286 10*3/uL (ref 150–400)
RBC: 3.51 MIL/uL — AB (ref 3.87–5.11)
RDW: 12.9 % (ref 11.5–15.5)
WBC: 12.2 10*3/uL — ABNORMAL HIGH (ref 4.0–10.5)

## 2015-03-13 LAB — GLUCOSE, CAPILLARY
GLUCOSE-CAPILLARY: 219 mg/dL — AB (ref 65–99)
GLUCOSE-CAPILLARY: 134 mg/dL — AB (ref 65–99)
Glucose-Capillary: 252 mg/dL — ABNORMAL HIGH (ref 65–99)
Glucose-Capillary: 276 mg/dL — ABNORMAL HIGH (ref 65–99)
Glucose-Capillary: 202 mg/dL — ABNORMAL HIGH (ref 65–99)

## 2015-03-13 NOTE — Evaluation (Signed)
Physical Therapy Evaluation Patient Details Name: Stephanie Hammond MRN: [id number] DOB: 1951/07/31 Today's Date: 03/13/2015   History of Present Illness  Stephanie Hammond, 64 y.o. female, has a history of pain and functional disability in the left knee due to arthritis and has failed non-surgical conservative treatments for greater than 12 weeks to includeNSAID's and/or analgesics, use of assistive devices and activity modification. Onset of symptoms was gradual, starting several years ago  years ago with gradually worsening course since that time. The patient noted no past surgery on the left knee(s). Patient currently rates pain in the left knee(s) at 6 out of 10 with activity. Patient has night pain, worsening of pain with activity and weight bearing, pain that interferes with activities of daily living, pain with passive range of motion, crepitus and joint swelling. Patient has evidence of subchondral cysts, subchondral sclerosis, periarticular osteophytes and joint space narrowing by imaging studies. There is no active infection.  Clinical Impression  Patient found supine in bed, very pleasant and willing to participate in skilled PT services today. Noted weakness especially in L LE post surgery, as patient did display difficulty moving leg during mobility and did state that "I cannot pick it up", but this did improve after active exercise for muscles surrounding knee. Min guard-Min(A) for bed mobility today. Able to ambulate approximately 73ft with walker and cues for form, although pain did increase to 5/10 with ambulation and returned back to 0/10 when patient returned to sitting at edge of bed. Measured AAROM to be 14 degrees extension and 70 degrees flexion today, for a total range of 56 degrees. Placed CPM on patient at setting of 0-60 degrees and at a rate of 30 degrees per minute; remained with patient through one full cycle and patient confirmed that CPM was comfortable and not causing  pain. Educated nursing staff regarding placement/application/use of CPM machine, as well as length of time in machine and parameters; also educated that patient should not use bedpan anymore, needs to be getting up with CNAs to either bedside commode or to bathroom in room and can come out of CPM to do so as long as she is put back in afterwards. CNA also educated regarding time in CPM and how to turn machine off if patient has pain or discomfort. Patient to remain in CPM for 6-8 hours today. At this time patient is in need of skilled PT services during her stay at this facility, and is most appropriate for HHPT upon discharge by MD.     Follow Up Recommendations Home health PT    Equipment Recommendations  Standard walker    Recommendations for Other Services       Precautions / Restrictions Precautions Precautions: Knee;Fall Precaution Booklet Issued: No Precaution Comments: WBAT L LE  Restrictions Weight Bearing Restrictions: Yes LLE Weight Bearing: Weight bearing as tolerated      Mobility  Bed Mobility Overal bed mobility: Needs Assistance Bed Mobility: Supine to Sit;Sit to Supine   Sidelying to sit: Min assist Supine to sit: Min guard     General bed mobility comments: assist to stabilize LE and control ROM   Transfers Overall transfer level: Needs assistance Equipment used: Rolling walker (2 wheeled) Transfers: Sit to/from Stand Sit to Stand: Min guard            Ambulation/Gait Ambulation/Gait assistance: Min guard Ambulation Distance (Feet): 20 Feet Assistive device: Rolling walker (2 wheeled) Gait Pattern/deviations: Step-to pattern;Decreased step length - right;Decreased stance time - left;Decreased dorsiflexion -  left;Decreased weight shift to left;Trunk flexed Gait velocity: appropriate for situation       Stairs            Wheelchair Mobility    Modified Rankin (Stroke Patients Only)       Balance Overall balance assessment: No apparent  balance deficits (not formally assessed)                                           Pertinent Vitals/Pain Pain Assessment: No/denies pain Pain Location: L knee  Pain Intervention(s): Monitored during session;Limited activity within patient's tolerance    Home Living Family/patient expects to be discharged to:: Private residence Living Arrangements: Spouse/significant other Available Help at Discharge: Family;Available 24 hours/day Type of Home: House Home Access: Stairs to enter Entrance Stairs-Rails: None Entrance Stairs-Number of Steps: 2 Home Layout: One level Home Equipment: Walker - 2 wheels;Bedside commode      Prior Function Level of Independence: Independent               Hand Dominance        Extremity/Trunk Assessment   Upper Extremity Assessment: Defer to OT evaluation           Lower Extremity Assessment: Generalized weakness      Cervical / Trunk Assessment: Normal  Communication   Communication: No difficulties  Cognition Arousal/Alertness: Awake/alert Behavior During Therapy: WFL for tasks assessed/performed Overall Cognitive Status: Within Functional Limits for tasks assessed                      General Comments General comments (skin integrity, edema, etc.): patient very pleasant and willing to participate today    Exercises Total Joint Exercises Quad Sets: AROM;Left;10 reps Gluteal Sets: Both;10 reps Heel Slides: AAROM;Left;10 reps Goniometric ROM: 14 degrees extension, 70 degrees flexion (for a total of 56 degrees of ROM today)      Assessment/Plan    PT Assessment Patient needs continued PT services  PT Diagnosis Difficulty walking;Abnormality of gait;Generalized weakness;Acute pain   PT Problem List Decreased strength;Decreased coordination;Pain;Decreased range of motion;Decreased activity tolerance;Decreased knowledge of use of DME;Decreased balance;Decreased safety awareness;Decreased mobility   PT Treatment Interventions DME instruction;Therapeutic exercise;Gait training;Balance training;Stair training;Neuromuscular re-education;Functional mobility training;Therapeutic activities;Patient/family education   PT Goals (Current goals can be found in the Care Plan section) Acute Rehab PT Goals Patient Stated Goal: to go home, get to PLOF  PT Goal Formulation: With patient Time For Goal Achievement: 03/27/15 Potential to Achieve Goals: Good    Frequency BID   Barriers to discharge        Co-evaluation               End of Session Equipment Utilized During Treatment: Gait belt Activity Tolerance: Patient tolerated treatment well Patient left: in bed;in CPM;with call bell/phone within reach;with family/visitor present Nurse Communication: Mobility status;Other (comment) (how to set up CPM, CPM parameters and time in machine )    Functional Assessment Tool Used: Skilled clinical assessment of strength, mobility, ROM, gait Functional Limitation: Mobility: Walking and moving around Mobility: Walking and Moving Around Current Status (B2841): At least 40 percent but less than 60 percent impaired, limited or restricted Mobility: Walking and Moving Around Goal Status [phone]): At least 20 percent but less than 40 percent impaired, limited or restricted    Time: 1030-1115 PT Time Calculation (min) (ACUTE ONLY): 45 min  Charges:   PT Evaluation $Initial PT Evaluation Tier I: 1 Procedure PT Treatments $Gait Training: 8-22 mins $Self Care/Home Management: 8-22   PT G Codes:   PT G-Codes **NOT FOR INPATIENT CLASS** Functional Assessment Tool Used: Skilled clinical assessment of strength, mobility, ROM, gait Functional Limitation: Mobility: Walking and moving around Mobility: Walking and Moving Around Current Status (Z6109): At least 40 percent but less than 60 percent impaired, limited or restricted Mobility: Walking and Moving Around Goal Status [phone]): At least 20 percent  but less than 40 percent impaired, limited or restricted    Nedra Hai PT, DPT [phone]

## 2015-03-13 NOTE — Anesthesia Postprocedure Evaluation (Signed)
  Anesthesia Post-op Note  Patient: Stephanie Hammond  Procedure(s) Performed: Procedure(s): LEFT TOTAL KNEE REPLACEMENT (Left)  Patient Location: Nursing Unit  Anesthesia Type:General and Spinal  Level of Consciousness: awake and alert   Airway and Oxygen Therapy: Patient Spontanous Breathing  Post-op Pain: mild  Post-op Assessment: Post-op Vital signs reviewed, Patient's Cardiovascular Status Stable, Respiratory Function Stable, Patent Airway and No signs of Nausea or vomiting LLE Motor Response: No movement due to regional block LLE Sensation: Full sensation RLE Motor Response: Purposeful movement RLE Sensation: Full sensation L Sensory Level: S1-Sole of foot, small toes R Sensory Level: S1-Sole of foot, small toes  Post-op Vital Signs: Reviewed and stable  Last Vitals:  Filed Vitals:   03/13/15 1019  BP: 121/61  Pulse: 79  Temp: 36.7 C  Resp: 20    Complications: No apparent anesthesia complications

## 2015-03-13 NOTE — Addendum Note (Signed)
Addendum  created 03/13/15 1324 by Vista Deck, CRNA   Modules edited: Notes Section   Notes Section:  File: [id number]

## 2015-03-13 NOTE — Progress Notes (Signed)
Postoperative note  Postoperative day number  1  Status post left tka  Vital signs  BP 121/61 mmHg  Pulse 79  Temp(Src) 98 F (36.7 C) (Oral)  Resp 20  Ht 5\' 3"  (1.6 m)  SpO2 97%   Pertinent labs   CBC Latest Ref Rng 03/13/2015 03/08/2015 01/12/2015  WBC 4.0 - 10.5 K/uL 12.2(H) 5.5 7.0  Hemoglobin 12.0 - 15.0 g/dL 11.2(L) 12.1 12.0  Hematocrit 36.0 - 46.0 % 31.0(L) 34.0(L) 35.7(L)  Platelets 150 - 400 K/uL 286 322 341    BMET    Component Value Date/Time   NA 139 03/13/2015 0622   K 4.6 03/13/2015 0622   CL 103 03/13/2015 0622   CO2 27 03/13/2015 0622   GLUCOSE 225* 03/13/2015 0622   GLUCOSE 80 05/23/2013 1703   BUN 17 03/13/2015 0622   CREATININE 0.82 03/13/2015 0622   CREATININE 0.74 01/12/2015 1229   CALCIUM 8.9 03/13/2015 0622   GFRNONAA >60 03/13/2015 0622   GFRAA >60 03/13/2015 0622      Patient complaints  None, ready for therapy   Physical exam  Normal drain removed   Assessment and plan   heplock iv Start PT Monitor glucose

## 2015-03-13 NOTE — Progress Notes (Addendum)
Pt had 1 vomiting episode at 1830 and was given Zofran 4 mg by Delia Chimes. At [place], pt still c.o nausea and reports no relief. Administered 10 mg po Reglan. Re-assessed at 2045. Pt reports no relief still. Pt also c/o cramping pain in abdominal region, but refuses pain medication at this time. Paged Dr Aline Brochure regarding this. Waiting to hear back from dr. Continuing to monitor pt. Bed remains in lowest position and call bell is within reach.  2200 Dr Aline Brochure called back and ordered Soap Suds Enema x 1. Will administer as soon as possible.

## 2015-03-14 LAB — CBC
HEMATOCRIT: 30.9 % — AB (ref 36.0–46.0)
Hemoglobin: 11.1 g/dL — ABNORMAL LOW (ref 12.0–15.0)
MCH: 31.9 pg (ref 26.0–34.0)
MCHC: 35.9 g/dL (ref 30.0–36.0)
MCV: 88.8 fL (ref 78.0–100.0)
Platelets: 287 10*3/uL (ref 150–400)
RBC: 3.48 MIL/uL — ABNORMAL LOW (ref 3.87–5.11)
RDW: 13 % (ref 11.5–15.5)
WBC: 10.1 10*3/uL (ref 4.0–10.5)

## 2015-03-14 LAB — GLUCOSE, CAPILLARY
GLUCOSE-CAPILLARY: 211 mg/dL — AB (ref 65–99)
GLUCOSE-CAPILLARY: 224 mg/dL — AB (ref 65–99)
GLUCOSE-CAPILLARY: 221 mg/dL — AB (ref 65–99)
Glucose-Capillary: 153 mg/dL — ABNORMAL HIGH (ref 65–99)

## 2015-03-14 LAB — PREPARE RBC (CROSSMATCH)

## 2015-03-14 MED ORDER — ONDANSETRON HCL 4 MG/2ML IJ SOLN: 4.0000 mg | INTRAMUSCULAR | Status: DC

## 2015-03-14 MED ORDER — METOCLOPRAMIDE HCL 10 MG PO TABS
5.0000 mg | ORAL_TABLET | Freq: Three times a day (TID) | ORAL | Status: DC
  Administered 2015-03-14 – 2015-03-15 (×4): 10 mg via ORAL
  Filled 2015-03-14 (×4): qty 1

## 2015-03-14 MED ORDER — METOCLOPRAMIDE HCL 5 MG/ML IJ SOLN: 5.0000 mg | Freq: Three times a day (TID) | INTRAMUSCULAR | Status: DC

## 2015-03-14 MED ORDER — ONDANSETRON HCL 4 MG PO TABS
4.0000 mg | ORAL_TABLET | ORAL | Status: DC
  Administered 2015-03-14 – 2015-03-15 (×4): 4 mg via ORAL
  Filled 2015-03-14 (×5): qty 1

## 2015-03-14 NOTE — Progress Notes (Signed)
Physical Therapy Treatment Patient Details Name: Stephanie Hammond MRN: [id number] DOB: Mar 03, 1951 Today's Date: 03/14/2015    History of Present Illness Stephanie Hammond, 64 y.o. female, has a history of pain and functional disability in the left knee due to arthritis and has failed non-surgical conservative treatments for greater than 12 weeks to includeNSAID's and/or analgesics, use of assistive devices and activity modification. Onset of symptoms was gradual, starting several years ago  years ago with gradually worsening course since that time. The patient noted no past surgery on the left knee(s). Patient currently rates pain in the left knee(s) at 6 out of 10 with activity. Patient has night pain, worsening of pain with activity and weight bearing, pain that interferes with activities of daily living, pain with passive range of motion, crepitus and joint swelling. Patient has evidence of subchondral cysts, subchondral sclerosis, periarticular osteophytes and joint space narrowing by imaging studies. There is no active infection.    Subjective  PT Comments    Pain scale 8/10, pt stated knee feels really tight and heavy today.  Pt stated she got sick on stomach last night.    Pt pleasant and eager to participate with therapy today.  Pt limited by pain this treatment with increased time to complete exercises with pauses between due to pain.  Pt stated she had rough night last night due to sick on stomach.  Pt sugar level taken during treatment with 211.  Gait training with RW with cueing to improve stance phase Lt LE and improve posture with min guard.  Pt c/o lightheadness during gait training, no LOB episodes, upon compliant pt back to room and supine.  Assistance required for lifting Lt LE onto bed.  Pt stated knee very tight and unable to lift independently.  Pt left in bed with CPM set for 0-70 degrees, call bell within reach and ice applied to knee for pain and edema control.  Pain scale  at end of session 8/10.  Nursing informed and pain medication given.  Follow Up Recommendations        Equipment Recommendations       Recommendations for Other Services       Precautions / Restrictions Precautions Precautions: Knee;Fall Precaution Booklet Issued: No Precaution Comments: WBAT L LE  Restrictions Weight Bearing Restrictions: No LLE Weight Bearing: Weight bearing as tolerated    Mobility  Bed Mobility Overal bed mobility: Needs Assistance (With Lt LE) Bed Mobility: Sit to Supine       Sit to supine: Min assist   General bed mobility comments: assist to stabilize LE and control ROM   Transfers Overall transfer level: Needs assistance Equipment used: Rolling walker (2 wheeled) Transfers: Sit to/from Stand Sit to Stand: Min guard         General transfer comment: Min cueing for handplacement and to slide Lt LE forward for pain control with sit to stand  Ambulation/Gait Ambulation/Gait assistance: Min guard Ambulation Distance (Feet): 30 Feet Assistive device: Rolling walker (2 wheeled) Gait Pattern/deviations: Step-through pattern;Decreased step length - right;Decreased stance time - left Gait velocity: appropriate for situation        Stairs            Wheelchair Mobility    Modified Rankin (Stroke Patients Only)       Balance  Cognition Arousal/Alertness: Awake/alert Behavior During Therapy: WFL for tasks assessed/performed Overall Cognitive Status: Within Functional Limits for tasks assessed                      Exercises Total Joint Exercises Ankle Circles/Pumps: AROM;Both;20 reps;Seated Quad Sets: AROM;Left;10 reps;Seated Gluteal Sets: Both;10 reps;Seated Short Arc Quad: AAROM;Left;10 reps;Seated Heel Slides: AAROM;Left;10 reps Goniometric ROM: AROM 15-70 degrees     General Comments        Pertinent Vitals/Pain Pain Score: 8  Pain Location: L knee Pain  Descriptors / Indicators: Aching;Constant Pain Intervention(s): Monitored during session;Limited activity within patient's tolerance    Home Living                      Prior Function            PT Goals (current goals can now be found in the care plan section) Progress towards PT goals: Progressing toward goals    Frequency       PT Plan Current plan remains appropriate    Co-evaluation             End of Session Equipment Utilized During Treatment: Gait belt Activity Tolerance: Patient tolerated treatment well Patient left: in bed;in CPM;with call bell/phone within reach;with nursing/sitter in room     Time: 1050-1145 PT Time Calculation (min) (ACUTE ONLY): 55 min  Charges:  $Gait Training: 8-22 mins $Therapeutic Exercise: 8-22 mins $Therapeutic Activity: 8-22 mins                    G Codes:      Juel Burrow 03/14/2015, 12:02 PM

## 2015-03-14 NOTE — Progress Notes (Signed)
Postoperative note  Postoperative day number  2   Status post left tka   Vital signs  BP 109/58 mmHg  Pulse 104  Temp(Src) 98.6 F (37 C) (Oral)  Resp 20  Ht 5\' 3"  (1.6 m)  SpO2 97%   Pertinent labs   CBC Latest Ref Rng 03/14/2015 03/13/2015 03/08/2015  WBC 4.0 - 10.5 K/uL 10.1 12.2(H) 5.5  Hemoglobin 12.0 - 15.0 g/dL 11.1(L) 11.2(L) 12.1  Hematocrit 36.0 - 46.0 % 30.9(L) 31.0(L) 34.0(L)  Platelets 150 - 400 K/uL 287 286 322   cbg 153  Patient complaints  [place] and vomiting yesterday   Physical exam  Dressing intact (aquacel dressing stays on x 7 days )   Assessment and plan   Monitor glucose  Stop celebrex and lyrica Adjust reglan and zofran Continue PT

## 2015-03-15 ENCOUNTER — Telehealth: Payer: Self-pay | Admitting: *Deleted

## 2015-03-15 ENCOUNTER — Encounter (HOSPITAL_COMMUNITY): Payer: Self-pay | Admitting: Orthopedic Surgery

## 2015-03-15 LAB — GLUCOSE, CAPILLARY
GLUCOSE-CAPILLARY: 193 mg/dL — AB (ref 65–99)
Glucose-Capillary: 144 mg/dL — ABNORMAL HIGH (ref 65–99)

## 2015-03-15 LAB — CBC
HCT: 30 % — ABNORMAL LOW (ref 36.0–46.0)
Hemoglobin: 10.5 g/dL — ABNORMAL LOW (ref 12.0–15.0)
MCH: 31.2 pg (ref 26.0–34.0)
MCHC: 35 g/dL (ref 30.0–36.0)
MCV: 89 fL (ref 78.0–100.0)
Platelets: 298 10*3/uL (ref 150–400)
RBC: 3.37 MIL/uL — ABNORMAL LOW (ref 3.87–5.11)
RDW: 13 % (ref 11.5–15.5)
WBC: 9.1 10*3/uL (ref 4.0–10.5)

## 2015-03-15 MED ORDER — OXYCODONE-ACETAMINOPHEN 10-325 MG PO TABS: 1.0000 | ORAL_TABLET | ORAL | Status: DC | PRN

## 2015-03-15 MED ORDER — ASPIRIN 325 MG PO TBEC: 325.0000 mg | DELAYED_RELEASE_TABLET | Freq: Two times a day (BID) | ORAL | Status: DC

## 2015-03-15 NOTE — Progress Notes (Signed)
PT Cancellation Note  Patient Details Name: Stephanie Hammond MRN: [id number] DOB: 10/11/1950   Cancelled Treatment:    Reason Eval/Treat Not Completed: Patient declined, no reason specified  Pt refused therapy, no reason specified.  Session focus was to instruct proper mechanics with stairs.  Pt was educated on proper technique utilizing step to pattern with non-surgical LE leading with ascending and surgical LE descending, pt able to verbalize understanding of appropriate pattern with stairs.  Pt also educated on technique with cryocuff for pain and edema control.  [address] LPTA; [place]  Aldona Lento 03/15/2015, 2:45 PM

## 2015-03-15 NOTE — Discharge Instructions (Signed)
NO CPM   USE CRYOCUFF TO ICE THE KNEE AS NEEDED FOR SWELLING

## 2015-03-15 NOTE — Progress Notes (Signed)
Physical Therapy Treatment Patient Details Name: Stephanie Hammond MRN: [id number] DOB: [birth year] Today's Date: 03/15/2015    History of Present Illness [name], 64 y.o. female, has a history of pain and functional disability in the left knee due to arthritis and has failed non-surgical conservative treatments for greater than 12 weeks to includeNSAID's and/or analgesics, use of assistive devices and activity modification. Onset of symptoms was gradual, starting several years ago  years ago with gradually worsening course since that time. The patient noted no past surgery on the left knee(s). Patient currently rates pain in the left knee(s) at 6 out of 10 with activity. Patient has night pain, worsening of pain with activity and weight bearing, pain that interferes with activities of daily living, pain with passive range of motion, crepitus and joint swelling. Patient has evidence of subchondral cysts, subchondral sclerosis, periarticular osteophytes and joint space narrowing by imaging studies. There is no active infection.    PT Comments    Patient found sitting up at edge of bed, watching TV; patient reported that she was feeling a bit off yesterday and didn't get to do much, does not like CPM as it is very uncomfortable, continues to be concerned about not being able to bend her knee on her own. Performed gait approximately 50 feet today with rolling walker; discussed importance of stair training but patient did not want to attempt today, stating that she would do them tomorrow. Performed AAROM of knee extension and flexion at bedside and provided patient with extensive education regarding possible reasons for not being able to fully extend/flex knee on her own yet including edema, acute muscle guarding effects of recent surgery, etc. Patient reported that she felt much better after this education and after moving the knee but continued to refuse CPM. Strongly encouraged patient to  continue with exercises including quad sets, glut squeezes, heel slides, ankle pumps; also encouraged patient to perform AAROM to L knee using R LE when she is sitting up in the chair or at edge of bed. Stair training to occur next visit.   Follow Up Recommendations  Home health PT     Equipment Recommendations  Standard walker    Recommendations for Other Services       Precautions / Restrictions Precautions Precautions: Knee;Fall Precaution Booklet Issued: No Precaution Comments: WBAT L LE  Restrictions Weight Bearing Restrictions: No LLE Weight Bearing: Weight bearing as tolerated    Mobility  Bed Mobility                  Transfers Overall transfer level: Needs assistance Equipment used: Rolling walker (2 wheeled)   Sit to Stand: Min guard         General transfer comment: Min cueing for handplacement and to slide Lt LE forward for pain control with sit to stand  Ambulation/Gait Ambulation/Gait assistance: Supervision Ambulation Distance (Feet): 50 Feet Assistive device: Rolling walker (2 wheeled) Gait Pattern/deviations: Step-through pattern;Decreased step length - right;Decreased stance time - left;Trunk flexed Gait velocity: appropriate for situation        Stairs            Wheelchair Mobility    Modified Rankin (Stroke Patients Only)       Balance Overall balance assessment: No apparent balance deficits (not formally assessed)  Cognition Arousal/Alertness: Awake/alert Behavior During Therapy: WFL for tasks assessed/performed Overall Cognitive Status: Within Functional Limits for tasks assessed                      Exercises Total Joint Exercises Long Arc Quad: AAROM;Left;20 reps;Seated Knee Flexion: AAROM;Left;20 reps;Seated    General Comments        Pertinent Vitals/Pain Pain Assessment: No/denies pain    Home Living                      Prior Function             PT Goals (current goals can now be found in the care plan section) Acute Rehab PT Goals Patient Stated Goal: to go home, get to PLOF  PT Goal Formulation: With patient Time For Goal Achievement: 03/27/15 Potential to Achieve Goals: Good    Frequency  BID    PT Plan Current plan remains appropriate    Co-evaluation             End of Session Equipment Utilized During Treatment: Gait belt Activity Tolerance: Patient tolerated treatment well Patient left: in bed;with call bell/phone within reach     Time: 1115-1140 PT Time Calculation (min) (ACUTE ONLY): 25 min  Charges:  $Gait Training: 8-22 mins $Therapeutic Exercise: 8-22 mins                    G Codes:      Nedra Hai PT, DPT [phone]

## 2015-03-15 NOTE — Care Management Note (Addendum)
Case Management Note  Patient Details  Name: Stephanie Hammond MRN: [id number] Date of Birth: 03-17-1951   Expected Discharge Date:  03/15/15               Expected Discharge Plan:  [place]  In-House Referral:  NA  Discharge planning Services  CM Consult  Post Acute Care Choice:    Choice offered to:  Patient  DME Arranged:    DME Agency:     HH Arranged:  PT [place]:  [facility]  Status of Service:  Completed, signed off  Medicare Important Message Given:  Yes-second notification given Date Medicare IM Given:    Medicare IM give by:    Date Additional Medicare IM Given:    Additional Medicare Important Message give by:     If discussed at [place] of Stay Meetings, dates discussed:    Additional Comments: Pt is form home, lives with her husband and is independent at baseline. Pt discharging today with [place] services. PT has chosen [facility] for [facility] agency. Romualdo Bolk, of [facility] made aware and will obtain pt info from chart. Pt aware [facility] has 48 hours to make first visit. Pt has also mentioned that she refused CPM machine that was delivered by [facility] [employer]. (Referred by Dr. Ruthe Mannan office.)Standard walker recommended by PT, pt says she has a walker at home. No further CM needs.  Sherald Barge, RN 03/15/2015, 1:26 PM

## 2015-03-15 NOTE — Telephone Encounter (Signed)
ANDY WITH [employer] IS CALLING STATING PATIENT IS REFUSING THE CPM MACHINE

## 2015-03-15 NOTE — Progress Notes (Signed)
Discharge instructions given on medications,and follow up visits,patient verbalized understanding. Prescriptions sent with patient. No c/o pain or discomfort noted. Accompanied by staff to an awaiting vehicle

## 2015-03-15 NOTE — Discharge Summary (Signed)
Physician Discharge Summary  Patient ID: Stephanie Hammond MRN: [id number] DOB/AGE: [birth year] 64 y.o.  Admit date: 03/12/2015 Discharge date: 03/15/2015  Admission Diagnoses: Osteoarthritis left knee  Discharge Diagnoses: Osteoarthritis left knee Active Problems:   Arthritis of left knee   Arthritis of knee, degenerative   Discharged Condition: stable  Hospital Course:  The patient was discharged on 03/15/2015 after morning physical therapy On August 7 patient had physical therapy did well On August 6 the patient had nausea vomiting was treated with Zofran and Phenergan. We corrected her hyperglycemia with insulin and her oral medications On August 5 the patient underwent uncomplicated left total knee arthroplasty under spinal metastatic with the Depew posterior stabilized implant       Discharge Exam: Blood pressure 144/66, pulse 116, temperature 98.8 F (37.1 C), temperature source Oral, resp. rate 20, height 5\' 3"  (1.6 m), SpO2 98 %. Incision/Wound: clean  Disposition: 01-Home or Self Care  Discharge Instructions    Call MD / Call 911    Complete by:  As directed   If you experience chest pain or shortness of breath, CALL 911 and be transported to the hospital emergency room.  If you develope a fever above 101 F, pus (white drainage) or increased drainage or redness at the wound, or calf pain, call your surgeon's office.     Change dressing    Complete by:  As directed   Change dressing on wednes, then change the dressing daily with sterile 4 x 4 inch gauze dressing and apply TED hose.  You may clean the incision with alcohol prior to redressing.     Constipation Prevention    Complete by:  As directed   Drink plenty of fluids.  Prune juice may be helpful.  You may use a stool softener, such as Colace (over the counter) 100 mg twice a day.  Use MiraLax (over the counter) for constipation as needed.     Diet - low sodium heart healthy    Complete by:  As directed      Face-to-face encounter (required for Medicare/Medicaid patients)    Complete by:  As directed   I Stephanie Hammond certify that this patient is under my care and that I, or a nurse practitioner or physician's assistant working with me, had a face-to-face encounter that meets the physician face-to-face encounter requirements with this patient on 03/15/2015. The encounter with the patient was in whole, or in part for the following medical condition(s) which is the primary reason for home health care (List medical condition): left knee tka  NURSE CHANGE DRESSING WEDS  The encounter with the patient was in whole, or in part, for the following medical condition, which is the primary reason for home health care:  tka left knee  I certify that, based on my findings, the following services are medically necessary home health services:  Nursing  Reason for Medically Necessary Home Health Services:  Therapy- Investment banker, operational, Patent examiner  My clinical findings support the need for the above services:  Unsafe ambulation due to balance issues  Further, I certify that my clinical findings support that this patient is homebound due to:  Ambulates short distances less than 300 feet     Home Health    Complete by:  As directed   To provide the following care/treatments:   PT RN       Increase activity slowly as tolerated    Complete by:  As directed  TED hose    Complete by:  As directed   Use stockings (TED hose) for 4  weeks on both leg(s).  You may remove them at night for sleeping.            Medication List    STOP taking these medications        acetaminophen 500 MG tablet  Commonly known as:  TYLENOL     oxyCODONE-acetaminophen 7.5-325 MG per tablet  Commonly known as:  PERCOCET  Replaced by:  oxyCODONE-acetaminophen 10-325 MG per tablet      TAKE these medications        allopurinol 300 MG tablet  Commonly known as:  ZYLOPRIM  TAKE 1 TABLET BY MOUTH EVERY DAY      amLODipine 10 MG tablet  Commonly known as:  NORVASC  TAKE 1 TABLET BY MOUTH EVERY DAY     aspirin 325 MG EC tablet  Take 1 tablet (325 mg total) by mouth 2 (two) times daily.     FLORA-Q 2 Caps  Take 1 tablet daily for IBS     losartan 100 MG tablet  Commonly known as:  COZAAR  TAKE 1 TABLET BY MOUTH EVERY DAY     metFORMIN 500 MG 24 hr tablet  Commonly known as:  GLUCOPHAGE-XR  Take 2 tablets (1,000 mg total) by mouth daily with breakfast.     omeprazole 20 MG capsule  Commonly known as:  PRILOSEC  TAKE ONE CAPSULE BY MOUTH EVERY MORNING     oxyCODONE-acetaminophen 10-325 MG per tablet  Commonly known as:  PERCOCET  Take 1 tablet by mouth every 4 (four) hours as needed for pain.     simvastatin 10 MG tablet  Commonly known as:  ZOCOR  TAKE 1 TABLET BY MOUTH EVERY MORNING        the patient has refused a CPM at home  The patient will take the Cryo/Cuff home and use as needed to apply ice to the knee  Aspirin will be taken for 30 days  Home compression devices will be used for DVT prevention along with aspirin    Signed: [name] 03/15/2015, 1:17 PM

## 2015-03-15 NOTE — Care Management Important Message (Signed)
Important Message  Patient Details  Name: Stephanie Hammond MRN: [id number] Date of Birth: 04-Oct-1950   Medicare Important Message Given:  Yes-second notification given    Sherald Barge, RN 03/15/2015, 1:26 PM

## 2015-03-18 LAB — TYPE AND SCREEN
ABO/RH(D): B POS
Antibody Screen: NEGATIVE
Unit division: 0
Unit division: 0

## 2015-03-19 ENCOUNTER — Other Ambulatory Visit: Payer: Self-pay | Admitting: Family Medicine

## 2015-03-22 ENCOUNTER — Ambulatory Visit (INDEPENDENT_AMBULATORY_CARE_PROVIDER_SITE_OTHER): Payer: Medicare HMO | Admitting: Family Medicine

## 2015-03-22 ENCOUNTER — Ambulatory Visit: Payer: Medicare HMO | Admitting: Orthopedic Surgery

## 2015-03-22 ENCOUNTER — Ambulatory Visit: Payer: Medicare HMO | Admitting: Family Medicine

## 2015-03-22 ENCOUNTER — Encounter: Payer: Self-pay | Admitting: Family Medicine

## 2015-03-22 VITALS — BP 128/72 | HR 78 | Temp 98.1°F | Resp 12 | Ht 63.0 in | Wt 216.0 lb

## 2015-03-22 DIAGNOSIS — E1140 Type 2 diabetes mellitus with diabetic neuropathy, unspecified: Secondary | ICD-10-CM | POA: Diagnosis not present

## 2015-03-22 NOTE — Patient Instructions (Signed)
Use Tujeo start with 10 units with night for blood sugars - fasting > 200, if 150-200 - give 5 units Stop after blood sugars < 150 fasting, and take Metformin F/U 2 Months

## 2015-03-22 NOTE — Assessment & Plan Note (Signed)
Her diabetes has been well controlled at the current spike is due to the IV steroids that she received during inpatient. I discussed we can add an oral medication short-term but she wants to use insulin as she has used this in the past. I have given her a sample of Tujeo she has parameters on the use. She will use 10 units at bedtime for fasting blood sugars greater than 200 for fasting blood sugars greater then 150 units and if her fastings go below 150 she can just take the metformin.  I suspect she will be off of insulin therapy within the next 2-3 weeks I continue to reiterate dietary indiscretion which she does not follow

## 2015-03-22 NOTE — Progress Notes (Signed)
Patient ID: Stephanie Hammond, female   DOB: May 29, 1951, 64 y.o.   MRN: [id number]   Subjective:    Patient ID: Stephanie Hammond, female    DOB: Jul 26, 1951, 64 y.o.   MRN: [id number]  Patient presents for Elevated CBG   patient here with elevated blood sugars. Her last A1c was 6.7% back in June she is currently on metformin thousand milligrams extended release once a day. She states that since she's had her knee surgery her blood sugars have been 300-400. When I reviewed her chart the diabetes nurse did come around to see her she received 2 doses of Decadron 4 mg spiking her blood sugar she was given sliding scale insulin in the hospital and sent home on her regular metformin. She still continues to have very high blood sugars and was concerned about this. She denies any chest pain shortness of breath no fever no polyuria polydipsia. She has not been eating very well and eating a lot of sweets as well.    Review Of Systems:  GEN- denies fatigue, fever, weight loss,weakness, recent illness HEENT- denies eye drainage, change in vision, nasal discharge, CVS- denies chest pain, palpitations RESP- denies SOB, cough, wheeze ABD- denies N/V, change in stools, abd pain GU- denies dysuria, hematuria, dribbling, incontinence MSK- + joint pain, muscle aches, injury Neuro- denies headache, dizziness, syncope, seizure activity       Objective:    BP 128/72 mmHg  Pulse 78  Temp(Src) 98.1 F (36.7 C) (Oral)  Resp 12  Ht 5\' 3"  (1.6 m)  Wt 216 lb (97.977 kg)  BMI 38.27 kg/m2 GEN- NAD, alert and oriented x3 CVS- RRR, no murmur RESP-CTAB MSK- Left knee- + swelling, incision covered, no warmth, no erythema EXT- No edema         Assessment & Plan:      Problem List Items Addressed This Visit    Type 2 diabetes mellitus with diabetic neuropathy - Primary      Note: This dictation was prepared with Dragon dictation along with smaller phrase technology. Any transcriptional errors that  result from this process are unintentional.

## 2015-03-25 ENCOUNTER — Encounter: Payer: Self-pay | Admitting: Orthopedic Surgery

## 2015-03-25 ENCOUNTER — Ambulatory Visit (INDEPENDENT_AMBULATORY_CARE_PROVIDER_SITE_OTHER): Payer: Self-pay | Admitting: Orthopedic Surgery

## 2015-03-25 VITALS — BP 127/81 | Ht 63.0 in | Wt 216.0 lb

## 2015-03-25 DIAGNOSIS — Z4789 Encounter for other orthopedic aftercare: Secondary | ICD-10-CM

## 2015-03-25 DIAGNOSIS — Z96652 Presence of left artificial knee joint: Secondary | ICD-10-CM

## 2015-03-25 NOTE — Patient Instructions (Signed)
Start outpatient therapy once released from home therapy   Please be advised:  You are on a narcotic pain medication to control your pain. Medications such as hydrocodone, Norco, oxycodone, OxyContin,  Dilaudid and similar medications have been associated with dependence and addiction. Please work with me to try to eliminate these medications as soon as possible to avoid complications of dependence and addiction.  Only take these medications as prescribed and for the problem they were prescribed for. Once your injury has healed please wean yourself from these medications. Once your surgery has been completed please start taking as little of this medication as possible.  If you have a problem taking yourself off of these medications please discuss with me so that together we can avoid serious complications which include dependence, addiction and death.

## 2015-03-25 NOTE — Progress Notes (Signed)
Patient ID: Stephanie Hammond, female   DOB: [birth year], 64 y.o.   MRN: [id number]  Chief Complaint  Patient presents with  . Follow-up    post op 1, LEFT TKA, DOS 03/12/15    HPI Stephanie Hammond is a 64 y.o. female.  13 days postop left total knee doing well no complaints   Allergies  Allergen Reactions  . Ace Inhibitors Cough  . Bentyl [Dicyclomine Hcl] Other (See Comments)    Blurry vision  . Penicillins Itching and Swelling    Current Outpatient Prescriptions  Medication Sig Dispense Refill  . allopurinol (ZYLOPRIM) 300 MG tablet TAKE 1 TABLET BY MOUTH EVERY DAY 90 tablet 2  . amLODipine (NORVASC) 10 MG tablet TAKE 1 TABLET BY MOUTH EVERY DAY 90 tablet 3  . aspirin EC 325 MG EC tablet Take 1 tablet (325 mg total) by mouth 2 (two) times daily. 60 tablet 0  . losartan (COZAAR) 100 MG tablet TAKE 1 TABLET BY MOUTH EVERY DAY 90 tablet 1  . metFORMIN (GLUCOPHAGE-XR) 500 MG 24 hr tablet TAKE 2 TABLETS(1000 MG) BY MOUTH DAILY WITH BREAKFAST 180 tablet 2  . omeprazole (PRILOSEC) 20 MG capsule TAKE ONE CAPSULE BY MOUTH EVERY MORNING 90 capsule 1  . oxyCODONE-acetaminophen (PERCOCET) 10-325 MG per tablet Take 1 tablet by mouth every 4 (four) hours as needed for pain. 90 tablet 0  . Probiotic Product (FLORA-Q 2) CAPS Take 1 tablet daily for IBS (Patient taking differently: Take 1 capsule by mouth daily. IBS) 30 capsule 6  . simvastatin (ZOCOR) 10 MG tablet TAKE 1 TABLET BY MOUTH EVERY MORNING 90 tablet 0   No current facility-administered medications for this visit.      Physical Exam Physical Exam Blood pressure 127/81, height 5\' 3"  (1.6 m), weight 216 lb (97.977 kg).  Appearance of incision: Incision is clean dry and intact we removed the staples  The calf was supple and the Homans sign was normal, there is minimal peripheral edema  Assessment and plan The patient is doing well and is in good condition  Follow-up will be 4 weeks

## 2015-03-29 ENCOUNTER — Telehealth: Payer: Self-pay | Admitting: Orthopedic Surgery

## 2015-03-29 NOTE — Telephone Encounter (Signed)
Yes  Returned call, no answer, voice mailbox full

## 2015-03-29 NOTE — Telephone Encounter (Signed)
Stacy the physical therapist from [facility] is calling stating that Stephanie Hammond is scheduled for outpatient PT on Wednesday and they are asking if they can get an order for 1 more Home Health PT visit for today to hold her until Wednesday, please advise?

## 2015-03-31 ENCOUNTER — Ambulatory Visit: Payer: Medicare HMO | Attending: Orthopedic Surgery | Admitting: Physical Therapy

## 2015-03-31 DIAGNOSIS — M25562 Pain in left knee: Secondary | ICD-10-CM | POA: Insufficient documentation

## 2015-03-31 DIAGNOSIS — M25662 Stiffness of left knee, not elsewhere classified: Secondary | ICD-10-CM | POA: Diagnosis present

## 2015-03-31 DIAGNOSIS — M2141 Flat foot [pes planus] (acquired), right foot: Secondary | ICD-10-CM | POA: Insufficient documentation

## 2015-03-31 DIAGNOSIS — R29898 Other symptoms and signs involving the musculoskeletal system: Secondary | ICD-10-CM

## 2015-03-31 NOTE — Therapy (Signed)
[facility] [address] [place], [place], [address] Phone: [phone]   Fax:  [phone]  Physical Therapy Evaluation  Patient Details  Name: Stephanie Hammond MRN: [id number] Date of Birth: [birth year] Referring Provider:  Vickki Hearing, MD  Encounter Date: 03/31/2015      PT End of Session - 03/31/15 2138    Visit Number 1   Number of Visits 18   Date for PT Re-Evaluation 05/26/15   PT Start Time 0100   PT Stop Time 0202   PT Time Calculation (min) 62 min   Activity Tolerance Patient tolerated treatment well   Behavior During Therapy [facility] for tasks assessed/performed      Past Medical History  Diagnosis Date  . IBS (irritable bowel syndrome)   . GERD (gastroesophageal reflux disease)   . Anemia   . History of GI diverticular bleed 08/2004  . Diverticulitis   . Diverticulosis   . Osteoarthritis   . Diabetes mellitus   . Blood transfusion 2009    after GI bleed  . Gout   . CAD (coronary artery disease)   . Hypertension     Past Surgical History  Procedure Laterality Date  . Ptca    . Vaginal hysterectomy    . Cholecystectomy  2003  . Replacement total knee      right   . Colonoscopy  2000    Dr. Russella Dar: marked diverticulosis, difficult procedure due to adhesions, polyps benign  . Colonoscopy  2006    Dr. Marina Goodell: severe pandiverticulosis  . Colonoscopy N/A 02/03/2013    SLF: 4 COLORECTAL POLYPS REMOVED/Moderate diverticulosis throughout the entire examined colon/Small internal hemorrhoids  . Joint replacement Right 2009    Knee Replacement Right   . Total knee arthroplasty Left 03/12/2015    Procedure: LEFT TOTAL KNEE REPLACEMENT;  Surgeon: Vickki Hearing, MD;  Location: [facility];  Service: Orthopedics;  Laterality: Left;    There were no vitals filed for this visit.  Visit Diagnosis:  Left knee pain - Plan: PT plan of care cert/re-cert  Knee stiffness, left - Plan: PT plan of care  cert/re-cert  Weakness of right foot - Plan: PT plan of care cert/re-cert      Subjective Assessment - 03/31/15 2129    Subjective I'm using a walker only as needed.   Patient Stated Goals Get out of pain.   Pain Score 6    Pain Location Knee   Pain Orientation Left   Pain Descriptors / Indicators Aching   Pain Type Surgical pain   Pain Frequency Constant   Multiple Pain Sites Yes            [facility] PT Assessment - 03/31/15 0001    Assessment   Medical Diagnosis left total knee replacement.   Onset Date/Surgical Date --  03/12/15.   Precautions   Precautions --  No ultrasound.   Restrictions   Weight Bearing Restrictions No   Balance Screen   Has the patient fallen in the past 6 months Yes   How many times? 6+(prior to surgery).   Has the patient had a decrease in activity level because of a fear of falling?  Yes   Is the patient reluctant to leave their home because of a fear of falling?  No   Home Environment   Living Environment Private residence   Prior Function   Level of Independence Independent   Observation/Other Assessments-Edema    Edema Circumferential   Circumferential Edema  Circumferential - Right 3 cms > right knee at mid-patellar position.   ROM / Strength   AROM / PROM / Strength AROM;Strength   AROM   Overall AROM Comments Left knee available AROM= -10 degrees to 95 degrees.   Strength   Overall Strength Comments Left hip= 3- to 3/5 and left knee strength= 4/5.   Palpation   Palpation comment Tender to palpation over left lateral tibial region.   Ambulation/Gait   Gait Comments Antalgic gait with walker today.                   [facility] Adult PT Treatment/Exercise - Apr 26, 2015 0001    Exercises   Exercises Knee/Hip   Knee/Hip Exercises: Aerobic   Nustep Nustep level 3 x 10 minutes   Modalities   Modalities Electrical Stimulation;Vasopneumatic   Electrical Stimulation   Electrical Stimulation Location --  IFC @ 1-10HZ  x 15 minutes  to patient's left knee.   Vasopneumatic   Number Minutes Vasopneumatic  15 minutes   Vasopnuematic Location  Knee   Vasopneumatic Pressure Medium                  PT Short Term Goals - 04-26-2015 07-22-2138    PT SHORT TERM GOAL #1   Title Ind with an HEP.   Time 6   Period Weeks   Status New           PT Long Term Goals - 04-26-2015 07-22-2138    PT LONG TERM GOAL #1   Title Full active left knee extension.   Time 6   Period Weeks   Status New   PT LONG TERM GOAL #2   Title Active left knee flexion to 115 degrees.   Time 6   Period Weeks   Status New   PT LONG TERM GOAL #3   Title Left LE strength= 5/5.   Time 6   Period Weeks   Status New   PT LONG TERM GOAL #4   Title Perform a reciprocating stair gait with one railing.   Time 6   Period Weeks   Status New   PT LONG TERM GOAL #5   Title Perform ADL's with pain not > 3/10.   Time 6   Period Weeks   Status New   Additional Long Term Goals   Additional Long Term Goals Yes   PT LONG TERM GOAL #6   Title Walk in clinic 500 feet without an assistive device and without antalgia.   Time 6   Period Weeks   Status New               Plan - 04-26-2015 07/23/31    Clinical Impression Statement The patient underwent a left total knee replacement on 03/12/15.  She is pleased with her progress thus far.  Her reported pain-level is a 5-6/10 today.   Pt will benefit from skilled therapeutic intervention in order to improve on the following deficits Pain;Decreased activity tolerance;Decreased strength;Decreased range of motion   Rehab Potential Excellent   PT Frequency 3x / week   PT Duration 6 weeks   PT Treatment/Interventions ADLs/Self Care Home Management;Cryotherapy;Electrical Stimulation;Functional mobility training;Therapeutic activities;Therapeutic exercise;Neuromuscular re-education;Patient/family education;Manual techniques;Vasopneumatic Device;Passive range of motion   PT Next Visit Plan Total knee replacement  protocol.  E'sim and vasopneumatic.          G-Codes - 04-26-2015 July 23, 2143    Functional Assessment Tool Used FOTO.   Functional Limitation Mobility: Walking and moving around  Mobility: Walking and Moving Around Current Status [phone]) At least 60 percent but less than 80 percent impaired, limited or restricted   Mobility: Walking and Moving Around Goal Status [phone]) At least 20 percent but less than 40 percent impaired, limited or restricted       Problem List Patient Active Problem List   Diagnosis Date Noted  . Arthritis of knee, degenerative 03/12/2015  . Arthritis of left knee   . Preoperative clearance 01/12/2015  . Gout 09/07/2014  . Back pain with left-sided radiculopathy 09/07/2014  . Pain in joint, pelvic region and thigh 06/25/2014  . Type 2 diabetes mellitus with diabetic neuropathy 02/14/2014  . Hyperlipidemia 03/12/2013  . IBS (irritable bowel syndrome) 01/09/2013  . Diabetic neuropathy 11/14/2012  . Diarrhea 09/13/2012  . Major depression, single episode 09/13/2012  . Essential hypertension, benign 06/09/2012  . Tachycardia 03/28/2012  . Diabetes with neurologic complications 03/28/2012  . DDD (degenerative disc disease), lumbar 03/28/2012  . Chronic knee pain 03/28/2012  . Obesity 03/28/2012  . Tobacco user 03/28/2012  . CAD (coronary artery disease) 03/28/2012    Jaidan Stachnik, [place]  MPT  03/31/2015, 9:46 PM  [facility] [address] [place], [place], [address] Phone: [phone]   Fax:  [phone]

## 2015-04-01 ENCOUNTER — Encounter: Payer: Medicare HMO | Admitting: Physical Therapy

## 2015-04-05 ENCOUNTER — Telehealth: Payer: Self-pay | Admitting: Orthopedic Surgery

## 2015-04-05 ENCOUNTER — Encounter: Payer: Self-pay | Admitting: Physical Therapy

## 2015-04-05 ENCOUNTER — Ambulatory Visit: Payer: Medicare HMO | Admitting: Physical Therapy

## 2015-04-05 ENCOUNTER — Other Ambulatory Visit: Payer: Self-pay | Admitting: *Deleted

## 2015-04-05 DIAGNOSIS — M25562 Pain in left knee: Secondary | ICD-10-CM | POA: Diagnosis not present

## 2015-04-05 DIAGNOSIS — M25662 Stiffness of left knee, not elsewhere classified: Secondary | ICD-10-CM

## 2015-04-05 DIAGNOSIS — R29898 Other symptoms and signs involving the musculoskeletal system: Secondary | ICD-10-CM

## 2015-04-05 MED ORDER — OXYCODONE-ACETAMINOPHEN 7.5-325 MG PO TABS: 1.0000 | ORAL_TABLET | ORAL | Status: DC | PRN

## 2015-04-05 NOTE — Patient Instructions (Signed)
Knee Extension Mobilization: Towel Prop   With rolled towel under right ankle, place _1-5___ pound weight across knee. Hold __5+__ minutes. Repeat __2-3__ times per set. Do __2__ sets per session. Do __2-4__ sessions per day.  Sitting knee extension stretch      Knee Flexion Stretch on Step  Place foot on step and lean forward until you feel a good stretch in front of knee.   hold 30 sec x 5-10 perform 2-4 x daily

## 2015-04-05 NOTE — Therapy (Signed)
[facility] [address] [place], [place], [address] Phone: [phone]   Fax:  [phone]  Physical Therapy Treatment  Patient Details  Name: Stephanie Hammond MRN: [id number] Date of Birth: 1950-12-08 Referring Provider:  Salley Scarlet, MD  Encounter Date: 04/05/2015      PT End of Session - 04/05/15 0936    Visit Number 2   Number of Visits 18   Date for PT Re-Evaluation 05/26/15   PT Start Time 0901   PT Stop Time 0950   PT Time Calculation (min) 49 min   Activity Tolerance Patient limited by pain   Behavior During Therapy [facility] for tasks assessed/performed      Past Medical History  Diagnosis Date  . IBS (irritable bowel syndrome)   . GERD (gastroesophageal reflux disease)   . Anemia   . History of GI diverticular bleed 08/2004  . Diverticulitis   . Diverticulosis   . Osteoarthritis   . Diabetes mellitus   . Blood transfusion 2009    after GI bleed  . Gout   . CAD (coronary artery disease)   . Hypertension     Past Surgical History  Procedure Laterality Date  . Ptca    . Vaginal hysterectomy    . Cholecystectomy  2003  . Replacement total knee      right   . Colonoscopy  2000    Dr. Russella Dar: marked diverticulosis, difficult procedure due to adhesions, polyps benign  . Colonoscopy  2006    Dr. Marina Goodell: severe pandiverticulosis  . Colonoscopy N/A 02/03/2013    SLF: 4 COLORECTAL POLYPS REMOVED/Moderate diverticulosis throughout the entire examined colon/Small internal hemorrhoids  . Joint replacement Right 2009    Knee Replacement Right   . Total knee arthroplasty Left 03/12/2015    Procedure: LEFT TOTAL KNEE REPLACEMENT;  Surgeon: Vickki Hearing, MD;  Location: [facility];  Service: Orthopedics;  Laterality: Left;    There were no vitals filed for this visit.  Visit Diagnosis:  Left knee pain  Knee stiffness, left  Weakness of right foot      Subjective Assessment - 04/05/15 0904    Subjective very  painful knee today /no medication per patient ran out and will call MD   Patient Stated Goals Get out of pain.   Currently in Pain? Yes   Pain Score 10-Worst pain ever   Pain Location Knee   Pain Orientation Left   Pain Descriptors / Indicators Aching   Pain Type Surgical pain   Pain Onset 1 to 4 weeks ago   Aggravating Factors  movement   Pain Relieving Factors rest            [facility] PT Assessment - 04/05/15 0001    ROM / Strength   AROM / PROM / Strength AROM;PROM   AROM   Overall AROM  Deficits   AROM Assessment Site Knee   Right/Left Knee Left   Left Knee Extension -6   Left Knee Flexion 91   PROM   Overall PROM  Deficits   PROM Assessment Site Knee   Right/Left Knee Left   Left Knee Extension -3   Left Knee Flexion 95                     [facility] Adult PT Treatment/Exercise - 04/05/15 0001    Knee/Hip Exercises: Aerobic   Nustep Nustep L1 x59min for ROM only   Knee/Hip Exercises: Standing   Rocker Board 1 minute  very painful   Production designer, theatre/television/film IFC   Electrical Stimulation Parameters 1-10hz    Electrical Stimulation Goals Pain   Vasopneumatic   Number Minutes Vasopneumatic  15 minutes   Vasopnuematic Location  Knee   Vasopneumatic Pressure Medium   Manual Therapy   Manual Therapy Passive ROM   Manual therapy comments gentle ROM for flexion/ext with low load holds end range                PT Education - 04/05/15 0936    Education provided Yes   Education Details HEP ROM   Person(s) Educated Patient   Methods Explanation;Demonstration;Handout   Comprehension Verbalized understanding;Returned demonstration          PT Short Term Goals - 03/31/15 2139    PT SHORT TERM GOAL #1   Title Ind with an HEP.   Time 6   Period Weeks   Status New           PT Long Term Goals - 03/31/15 2139    PT LONG TERM GOAL #1   Title Full active left knee extension.    Time 6   Period Weeks   Status New   PT LONG TERM GOAL #2   Title Active left knee flexion to 115 degrees.   Time 6   Period Weeks   Status New   PT LONG TERM GOAL #3   Title Left LE strength= 5/5.   Time 6   Period Weeks   Status New   PT LONG TERM GOAL #4   Title Perform a reciprocating stair gait with one railing.   Time 6   Period Weeks   Status New   PT LONG TERM GOAL #5   Title Perform ADL's with pain not > 3/10.   Time 6   Period Weeks   Status New   Additional Long Term Goals   Additional Long Term Goals Yes   PT LONG TERM GOAL #6   Title Walk in clinic 500 feet without an assistive device and without antalgia.   Time 6   Period Weeks   Status New               Plan - 04/05/15 1478    Clinical Impression Statement Patient progressing slowly today due to high pain level. patient reported she ran out of pain meds. HEP given for self ROM, patient understands importance of self stretches to improve ROM and functional independence.  patient somewhat limited today due to pain. Goals ongoing due to ROM, pain and strength deficits.   Pt will benefit from skilled therapeutic intervention in order to improve on the following deficits Pain;Decreased activity tolerance;Decreased strength;Decreased range of motion   Rehab Potential Excellent   PT Frequency 3x / week   PT Duration 6 weeks   PT Treatment/Interventions ADLs/Self Care Home Management;Cryotherapy;Electrical Stimulation;Functional mobility training;Therapeutic activities;Therapeutic exercise;Neuromuscular re-education;Patient/family education;Manual techniques;Vasopneumatic Device;Passive range of motion   PT Next Visit Plan Total knee replacement protocol.  E'sim and vasopneumatic.   Consulted and Agree with Plan of Care Patient        Problem List Patient Active Problem List   Diagnosis Date Noted  . Arthritis of knee, degenerative 03/12/2015  . Arthritis of left knee   . Preoperative clearance  01/12/2015  . Gout 09/07/2014  . Back pain with left-sided radiculopathy 09/07/2014  . Pain in joint, pelvic region and thigh 06/25/2014  . Type 2 diabetes  mellitus with diabetic neuropathy 02/14/2014  . Hyperlipidemia 03/12/2013  . IBS (irritable bowel syndrome) 01/09/2013  . Diabetic neuropathy 11/14/2012  . Diarrhea 09/13/2012  . Major depression, single episode 09/13/2012  . Essential hypertension, benign 06/09/2012  . Tachycardia 03/28/2012  . Diabetes with neurologic complications 03/28/2012  . DDD (degenerative disc disease), lumbar 03/28/2012  . Chronic knee pain 03/28/2012  . Obesity 03/28/2012  . Tobacco user 03/28/2012  . CAD (coronary artery disease) 03/28/2012    Hermelinda Dellen, PTA 04/05/2015, 9:54 AM  [facility] [address] [place], [place], [address] Phone: [phone]   Fax:  [phone]

## 2015-04-05 NOTE — Telephone Encounter (Signed)
Patient is calling requesting a refill on oxyCODONE-acetaminophen (PERCOCET) 10-325 MG per tablet please advise?

## 2015-04-06 NOTE — Telephone Encounter (Signed)
Patient picked up Rx

## 2015-04-06 NOTE — Telephone Encounter (Signed)
Prescription available, patient aware  

## 2015-04-08 ENCOUNTER — Ambulatory Visit: Payer: Medicare HMO | Attending: Orthopedic Surgery | Admitting: Physical Therapy

## 2015-04-08 ENCOUNTER — Encounter: Payer: Self-pay | Admitting: Physical Therapy

## 2015-04-08 DIAGNOSIS — M2141 Flat foot [pes planus] (acquired), right foot: Secondary | ICD-10-CM | POA: Diagnosis present

## 2015-04-08 DIAGNOSIS — M25562 Pain in left knee: Secondary | ICD-10-CM | POA: Insufficient documentation

## 2015-04-08 DIAGNOSIS — M25662 Stiffness of left knee, not elsewhere classified: Secondary | ICD-10-CM

## 2015-04-08 DIAGNOSIS — R29898 Other symptoms and signs involving the musculoskeletal system: Secondary | ICD-10-CM

## 2015-04-08 NOTE — Therapy (Signed)
[facility] [place], [place], [address] Phone: [phone]   Fax:  [phone]  Physical Therapy Treatment  Patient Details  Name: Stephanie Hammond MRN: [id number] Date of Birth: [birth year] Referring Provider:  Alycia Rossetti, MD  Encounter Date: 04/08/2015      PT End of Session - 04/08/15 0941    Visit Number 3   Number of Visits 18   Date for PT Re-Evaluation 05/26/15   PT Start Time 0909   PT Stop Time 0958   PT Time Calculation (min) 49 min   Activity Tolerance Patient tolerated treatment well   Behavior During Therapy [facility] for tasks assessed/performed      Past Medical History  Diagnosis Date  . IBS (irritable bowel syndrome)   . GERD (gastroesophageal reflux disease)   . Anemia   . History of GI diverticular bleed 08/2004  . Diverticulitis   . Diverticulosis   . Osteoarthritis   . Diabetes mellitus   . Blood transfusion 2009    after GI bleed  . Gout   . CAD (coronary artery disease)   . Hypertension     Past Surgical History  Procedure Laterality Date  . Ptca    . Vaginal hysterectomy    . Cholecystectomy  2003  . Replacement total knee      right   . Colonoscopy  2000    Dr. Fuller Plan: marked diverticulosis, difficult procedure due to adhesions, polyps benign  . Colonoscopy  2006    Dr. Henrene Pastor: severe pandiverticulosis  . Colonoscopy N/A 02/03/2013    SLF: 4 COLORECTAL POLYPS REMOVED/Moderate diverticulosis throughout the entire examined colon/Small internal hemorrhoids  . Joint replacement Right 2009    Knee Replacement Right   . Total knee arthroplasty Left 03/12/2015    Procedure: LEFT TOTAL KNEE REPLACEMENT;  Surgeon: Carole Civil, MD;  Location: [facility];  Service: Orthopedics;  Laterality: Left;    There were no vitals filed for this visit.  Visit Diagnosis:  Left knee pain  Knee stiffness, left  Weakness of right foot      Subjective Assessment - 04/08/15 0913    Subjective  patient reported less pain today due to meds   Patient Stated Goals Get out of pain.   Currently in Pain? Yes   Pain Score 5    Pain Location Knee   Pain Orientation Left   Pain Descriptors / Indicators Aching   Pain Type Surgical pain   Pain Onset 1 to 4 weeks ago   Pain Frequency Constant   Aggravating Factors  knee bending or activity   Pain Relieving Factors rest            [facility] PT Assessment - 04/08/15 0001    ROM / Strength   AROM / PROM / Strength AROM;PROM   AROM   Overall AROM  Deficits   AROM Assessment Site Knee   Right/Left Knee Left   Left Knee Extension -5   Left Knee Flexion 99   PROM   Overall PROM  Deficits   PROM Assessment Site Knee   Right/Left Knee Left   Left Knee Extension -2   Left Knee Flexion 109                     [facility] Adult PT Treatment/Exercise - 04/08/15 0001    Knee/Hip Exercises: Stretches   Knee: Self-Stretch to increase Flexion Left;3 reps;30 seconds   Knee/Hip Exercises: Aerobic   Nustep Nustep  [facility] [place], [place], [address] Phone: [phone]   Fax:  [phone]  Physical Therapy Treatment  Patient Details  Name: Stephanie Hammond MRN: [id number] Date of Birth: [birth year] Referring Provider:  Alycia Rossetti, MD  Encounter Date: 04/08/2015      PT End of Session - 04/08/15 0941    Visit Number 3   Number of Visits 18   Date for PT Re-Evaluation 05/26/15   PT Start Time 0909   PT Stop Time 0958   PT Time Calculation (min) 49 min   Activity Tolerance Patient tolerated treatment well   Behavior During Therapy [facility] for tasks assessed/performed      Past Medical History  Diagnosis Date  . IBS (irritable bowel syndrome)   . GERD (gastroesophageal reflux disease)   . Anemia   . History of GI diverticular bleed 08/2004  . Diverticulitis   . Diverticulosis   . Osteoarthritis   . Diabetes mellitus   . Blood transfusion 2009    after GI bleed  . Gout   . CAD (coronary artery disease)   . Hypertension     Past Surgical History  Procedure Laterality Date  . Ptca    . Vaginal hysterectomy    . Cholecystectomy  2003  . Replacement total knee      right   . Colonoscopy  2000    Dr. Fuller Plan: marked diverticulosis, difficult procedure due to adhesions, polyps benign  . Colonoscopy  2006    Dr. Henrene Pastor: severe pandiverticulosis  . Colonoscopy N/A 02/03/2013    SLF: 4 COLORECTAL POLYPS REMOVED/Moderate diverticulosis throughout the entire examined colon/Small internal hemorrhoids  . Joint replacement Right 2009    Knee Replacement Right   . Total knee arthroplasty Left 03/12/2015    Procedure: LEFT TOTAL KNEE REPLACEMENT;  Surgeon: Carole Civil, MD;  Location: [facility];  Service: Orthopedics;  Laterality: Left;    There were no vitals filed for this visit.  Visit Diagnosis:  Left knee pain  Knee stiffness, left  Weakness of right foot      Subjective Assessment - 04/08/15 0913    Subjective  patient reported less pain today due to meds   Patient Stated Goals Get out of pain.   Currently in Pain? Yes   Pain Score 5    Pain Location Knee   Pain Orientation Left   Pain Descriptors / Indicators Aching   Pain Type Surgical pain   Pain Onset 1 to 4 weeks ago   Pain Frequency Constant   Aggravating Factors  knee bending or activity   Pain Relieving Factors rest            [facility] PT Assessment - 04/08/15 0001    ROM / Strength   AROM / PROM / Strength AROM;PROM   AROM   Overall AROM  Deficits   AROM Assessment Site Knee   Right/Left Knee Left   Left Knee Extension -5   Left Knee Flexion 99   PROM   Overall PROM  Deficits   PROM Assessment Site Knee   Right/Left Knee Left   Left Knee Extension -2   Left Knee Flexion 109                     [facility] Adult PT Treatment/Exercise - 04/08/15 0001    Knee/Hip Exercises: Stretches   Knee: Self-Stretch to increase Flexion Left;3 reps;30 seconds   Knee/Hip Exercises: Aerobic   Nustep Nustep  [facility] [place], [place], [address] Phone: [phone]   Fax:  [phone]  Physical Therapy Treatment  Patient Details  Name: Stephanie Hammond MRN: [id number] Date of Birth: [birth year] Referring Provider:  Alycia Rossetti, MD  Encounter Date: 04/08/2015      PT End of Session - 04/08/15 0941    Visit Number 3   Number of Visits 18   Date for PT Re-Evaluation 05/26/15   PT Start Time 0909   PT Stop Time 0958   PT Time Calculation (min) 49 min   Activity Tolerance Patient tolerated treatment well   Behavior During Therapy [facility] for tasks assessed/performed      Past Medical History  Diagnosis Date  . IBS (irritable bowel syndrome)   . GERD (gastroesophageal reflux disease)   . Anemia   . History of GI diverticular bleed 08/2004  . Diverticulitis   . Diverticulosis   . Osteoarthritis   . Diabetes mellitus   . Blood transfusion 2009    after GI bleed  . Gout   . CAD (coronary artery disease)   . Hypertension     Past Surgical History  Procedure Laterality Date  . Ptca    . Vaginal hysterectomy    . Cholecystectomy  2003  . Replacement total knee      right   . Colonoscopy  2000    Dr. Fuller Plan: marked diverticulosis, difficult procedure due to adhesions, polyps benign  . Colonoscopy  2006    Dr. Henrene Pastor: severe pandiverticulosis  . Colonoscopy N/A 02/03/2013    SLF: 4 COLORECTAL POLYPS REMOVED/Moderate diverticulosis throughout the entire examined colon/Small internal hemorrhoids  . Joint replacement Right 2009    Knee Replacement Right   . Total knee arthroplasty Left 03/12/2015    Procedure: LEFT TOTAL KNEE REPLACEMENT;  Surgeon: Carole Civil, MD;  Location: [facility];  Service: Orthopedics;  Laterality: Left;    There were no vitals filed for this visit.  Visit Diagnosis:  Left knee pain  Knee stiffness, left  Weakness of right foot      Subjective Assessment - 04/08/15 0913    Subjective  patient reported less pain today due to meds   Patient Stated Goals Get out of pain.   Currently in Pain? Yes   Pain Score 5    Pain Location Knee   Pain Orientation Left   Pain Descriptors / Indicators Aching   Pain Type Surgical pain   Pain Onset 1 to 4 weeks ago   Pain Frequency Constant   Aggravating Factors  knee bending or activity   Pain Relieving Factors rest            [facility] PT Assessment - 04/08/15 0001    ROM / Strength   AROM / PROM / Strength AROM;PROM   AROM   Overall AROM  Deficits   AROM Assessment Site Knee   Right/Left Knee Left   Left Knee Extension -5   Left Knee Flexion 99   PROM   Overall PROM  Deficits   PROM Assessment Site Knee   Right/Left Knee Left   Left Knee Extension -2   Left Knee Flexion 109                     [facility] Adult PT Treatment/Exercise - 04/08/15 0001    Knee/Hip Exercises: Stretches   Knee: Self-Stretch to increase Flexion Left;3 reps;30 seconds   Knee/Hip Exercises: Aerobic   Nustep Nustep

## 2015-04-09 ENCOUNTER — Ambulatory Visit: Payer: Medicare HMO | Admitting: Physical Therapy

## 2015-04-09 DIAGNOSIS — M25662 Stiffness of left knee, not elsewhere classified: Secondary | ICD-10-CM

## 2015-04-09 DIAGNOSIS — M25562 Pain in left knee: Secondary | ICD-10-CM | POA: Diagnosis not present

## 2015-04-09 NOTE — Therapy (Signed)
[facility] [address] [place], [place], [address] Phone: [phone]   Fax:  [phone]  Physical Therapy Treatment  Patient Details  Name: Stephanie Hammond MRN: [id number] Date of Birth: 04/16/1951 Referring Provider:  Salley Scarlet, MD  Encounter Date: 04/09/2015      PT End of Session - 04/09/15 0909    Visit Number 4   Number of Visits 18   Date for PT Re-Evaluation 05/26/15   PT Start Time 0859   PT Stop Time 0957   PT Time Calculation (min) 58 min   Activity Tolerance Patient tolerated treatment well   Behavior During Therapy [facility] for tasks assessed/performed      Past Medical History  Diagnosis Date  . IBS (irritable bowel syndrome)   . GERD (gastroesophageal reflux disease)   . Anemia   . History of GI diverticular bleed 08/2004  . Diverticulitis   . Diverticulosis   . Osteoarthritis   . Diabetes mellitus   . Blood transfusion 2009    after GI bleed  . Gout   . CAD (coronary artery disease)   . Hypertension     Past Surgical History  Procedure Laterality Date  . Ptca    . Vaginal hysterectomy    . Cholecystectomy  2003  . Replacement total knee      right   . Colonoscopy  2000    Dr. Russella Dar: marked diverticulosis, difficult procedure due to adhesions, polyps benign  . Colonoscopy  2006    Dr. Marina Goodell: severe pandiverticulosis  . Colonoscopy N/A 02/03/2013    SLF: 4 COLORECTAL POLYPS REMOVED/Moderate diverticulosis throughout the entire examined colon/Small internal hemorrhoids  . Joint replacement Right 2009    Knee Replacement Right   . Total knee arthroplasty Left 03/12/2015    Procedure: LEFT TOTAL KNEE REPLACEMENT;  Surgeon: Vickki Hearing, MD;  Location: [facility];  Service: Orthopedics;  Laterality: Left;    There were no vitals filed for this visit.  Visit Diagnosis:  Left knee pain  Knee stiffness, left      Subjective Assessment - 04/09/15 0908    Subjective Patient reported she had a  nightmare last night and was kicking her legs.   Currently in Pain? Yes   Pain Score 5    Pain Location Knee   Pain Orientation Left   Pain Descriptors / Indicators Aching   Pain Type Surgical pain                         [facility] Adult PT Treatment/Exercise - 04/09/15 0001    Knee/Hip Exercises: Aerobic   Nustep Nustep L3 x11 min for ROM    Knee/Hip Exercises: Standing   Lateral Step Up Left;20 reps;Hand Hold: 2;Step Height: 6"   Forward Step Up Left;20 reps;Hand Hold: 2;Step Height: 6"   Rocker Board 3 minutes   Knee/Hip Exercises: Supine   Quad Sets Strengthening;Left;1 set;10 reps   Straight Leg Raises Strengthening;Left;1 set;15 reps   Modalities   Modalities Programmer, systems Action IFC   Electrical Stimulation Parameters 80-150 hZ   Electrical Stimulation Goals Pain   Vasopneumatic   Number Minutes Vasopneumatic  15 minutes   Vasopnuematic Location  Knee   Vasopneumatic Pressure Medium   Manual Therapy   Manual Therapy Passive ROM   Passive ROM for flexion and extension L knee  PT Short Term Goals - 04/08/15 0945    PT SHORT TERM GOAL #1   Title Ind with an HEP.   Time 6   Period Weeks   Status Achieved           PT Long Term Goals - 04/08/15 0945    PT LONG TERM GOAL #1   Title Full active left knee extension.   Period Weeks   Status On-going  -5   PT LONG TERM GOAL #2   Title Active left knee flexion to 115 degrees.   Time 6   Period Weeks   Status On-going  99   PT LONG TERM GOAL #3   Title Left LE strength= 5/5.   Time 6   Period Weeks   Status On-going   PT LONG TERM GOAL #4   Title Perform a reciprocating stair gait with one railing.   Time 6   Period Weeks   Status On-going   PT LONG TERM GOAL #5   Title Perform ADL's with pain not > 3/10.   Time 6   Period Weeks   Status On-going                Plan - 04/09/15 0950    Clinical Impression Statement Patient tolerated increased therex today with minor increase in knee pain. She continues to demonstrate gains with ROM.   PT Next Visit Plan cont with POC, continue to progress strengthening.        Problem List Patient Active Problem List   Diagnosis Date Noted  . Arthritis of knee, degenerative 03/12/2015  . Arthritis of left knee   . Preoperative clearance 01/12/2015  . Gout 09/07/2014  . Back pain with left-sided radiculopathy 09/07/2014  . Pain in joint, pelvic region and thigh 06/25/2014  . Type 2 diabetes mellitus with diabetic neuropathy 02/14/2014  . Hyperlipidemia 03/12/2013  . IBS (irritable bowel syndrome) 01/09/2013  . Diabetic neuropathy 11/14/2012  . Diarrhea 09/13/2012  . Major depression, single episode 09/13/2012  . Essential hypertension, benign 06/09/2012  . Tachycardia 03/28/2012  . Diabetes with neurologic complications 03/28/2012  . DDD (degenerative disc disease), lumbar 03/28/2012  . Chronic knee pain 03/28/2012  . Obesity 03/28/2012  . Tobacco user 03/28/2012  . CAD (coronary artery disease) 03/28/2012    Solon Palm PT  04/09/2015, 9:56 AM  [facility] [address] [place], [place], [address] Phone: [phone]   Fax:  [phone]

## 2015-04-15 ENCOUNTER — Ambulatory Visit: Payer: Medicare HMO | Admitting: Physical Therapy

## 2015-04-15 ENCOUNTER — Encounter: Payer: Self-pay | Admitting: Physical Therapy

## 2015-04-15 DIAGNOSIS — M25562 Pain in left knee: Secondary | ICD-10-CM | POA: Diagnosis not present

## 2015-04-15 DIAGNOSIS — M25662 Stiffness of left knee, not elsewhere classified: Secondary | ICD-10-CM

## 2015-04-15 DIAGNOSIS — R29898 Other symptoms and signs involving the musculoskeletal system: Secondary | ICD-10-CM

## 2015-04-15 NOTE — Therapy (Signed)
[facility] [address] [place], [place], [address] Phone: [phone]   Fax:  [phone]  Physical Therapy Treatment  Patient Details  Name: Stephanie Hammond MRN: [id number] Date of Birth: 1950-09-23 Referring Provider:  Salley Scarlet, MD  Encounter Date: 04/15/2015      PT End of Session - 04/15/15 0843    Visit Number 5   Number of Visits 18   Date for PT Re-Evaluation 05/26/15   PT Start Time 0813   PT Stop Time 0858   PT Time Calculation (min) 45 min   Activity Tolerance Patient tolerated treatment well   Behavior During Therapy [facility] for tasks assessed/performed      Past Medical History  Diagnosis Date  . IBS (irritable bowel syndrome)   . GERD (gastroesophageal reflux disease)   . Anemia   . History of GI diverticular bleed 08/2004  . Diverticulitis   . Diverticulosis   . Osteoarthritis   . Diabetes mellitus   . Blood transfusion 2009    after GI bleed  . Gout   . CAD (coronary artery disease)   . Hypertension     Past Surgical History  Procedure Laterality Date  . Ptca    . Vaginal hysterectomy    . Cholecystectomy  2003  . Replacement total knee      right   . Colonoscopy  2000    Dr. Russella Dar: marked diverticulosis, difficult procedure due to adhesions, polyps benign  . Colonoscopy  2006    Dr. Marina Goodell: severe pandiverticulosis  . Colonoscopy N/A 02/03/2013    SLF: 4 COLORECTAL POLYPS REMOVED/Moderate diverticulosis throughout the entire examined colon/Small internal hemorrhoids  . Joint replacement Right 2009    Knee Replacement Right   . Total knee arthroplasty Left 03/12/2015    Procedure: LEFT TOTAL KNEE REPLACEMENT;  Surgeon: Vickki Hearing, MD;  Location: [facility];  Service: Orthopedics;  Laterality: Left;    There were no vitals filed for this visit.  Visit Diagnosis:  Left knee pain  Knee stiffness, left  Weakness of right foot      Subjective Assessment - 04/15/15 0814    Subjective  sore knee today per patient, and feeling sick today   Patient Stated Goals Get out of pain.   Currently in Pain? Yes   Pain Score 6    Pain Location Knee   Pain Orientation Left   Pain Descriptors / Indicators Aching   Pain Type Surgical pain   Pain Onset 1 to 4 weeks ago   Pain Frequency Constant   Aggravating Factors  increased activity   Pain Relieving Factors rest            [facility] PT Assessment - 04/15/15 0001    AROM   Overall AROM  Deficits   AROM Assessment Site Knee   Right/Left Knee Left   Left Knee Extension -3   Left Knee Flexion 100   PROM   Overall PROM  Within functional limits for tasks performed;Deficits   PROM Assessment Site Knee   Right/Left Knee Left   Left Knee Extension 0   Left Knee Flexion 110                     [facility] Adult PT Treatment/Exercise - 04/15/15 0001    Knee/Hip Exercises: Aerobic   Nustep Nustep L3 x15 min for ROM    Knee/Hip Exercises: Standing   Rocker Board 3 minutes   Insurance claims handler  Stimulation Location knee   Electrical Stimulation Action IFC   Electrical Stimulation Parameters 80-150HZ  x60min   Electrical Stimulation Goals Pain   Vasopneumatic   Number Minutes Vasopneumatic  10 minutes   Vasopnuematic Location  Knee   Vasopneumatic Pressure Medium   Manual Therapy   Manual Therapy Passive ROM   Passive ROM for flexion and extension L knee                  PT Short Term Goals - 04/08/15 0945    PT SHORT TERM GOAL #1   Title Ind with an HEP.   Time 6   Period Weeks   Status Achieved           PT Long Term Goals - 04/08/15 0945    PT LONG TERM GOAL #1   Title Full active left knee extension.   Period Weeks   Status On-going  -5   PT LONG TERM GOAL #2   Title Active left knee flexion to 115 degrees.   Time 6   Period Weeks   Status On-going  99   PT LONG TERM GOAL #3   Title Left LE strength= 5/5.   Time 6   Period Weeks   Status On-going   PT LONG TERM  GOAL #4   Title Perform a reciprocating stair gait with one railing.   Time 6   Period Weeks   Status On-going   PT LONG TERM GOAL #5   Title Perform ADL's with pain not > 3/10.   Time 6   Period Weeks   Status On-going               Plan - 04/15/15 0846    Clinical Impression Statement Patient feeling sick today and had to shorten treatment. patient has some increased pain in left knee for unknown reason. Patient has improved with ROM today yet unable to meet any further goals due to pain, ROM and strength deficits.   Pt will benefit from skilled therapeutic intervention in order to improve on the following deficits Pain;Decreased activity tolerance;Decreased strength;Decreased range of motion   Rehab Potential Excellent   PT Frequency 3x / week   PT Duration 6 weeks   PT Treatment/Interventions ADLs/Self Care Home Management;Cryotherapy;Electrical Stimulation;Functional mobility training;Therapeutic activities;Therapeutic exercise;Neuromuscular re-education;Patient/family education;Manual techniques;Vasopneumatic Device;Passive range of motion   PT Next Visit Plan cont with POC, continue to progress strengthening.   Consulted and Agree with Plan of Care Patient        Problem List Patient Active Problem List   Diagnosis Date Noted  . Arthritis of knee, degenerative 03/12/2015  . Arthritis of left knee   . Preoperative clearance 01/12/2015  . Gout 09/07/2014  . Back pain with left-sided radiculopathy 09/07/2014  . Pain in joint, pelvic region and thigh 06/25/2014  . Type 2 diabetes mellitus with diabetic neuropathy 02/14/2014  . Hyperlipidemia 03/12/2013  . IBS (irritable bowel syndrome) 01/09/2013  . Diabetic neuropathy 11/14/2012  . Diarrhea 09/13/2012  . Major depression, single episode 09/13/2012  . Essential hypertension, benign 06/09/2012  . Tachycardia 03/28/2012  . Diabetes with neurologic complications 03/28/2012  . DDD (degenerative disc disease), lumbar  03/28/2012  . Chronic knee pain 03/28/2012  . Obesity 03/28/2012  . Tobacco user 03/28/2012  . CAD (coronary artery disease) 03/28/2012    Hermelinda Dellen, PTA 04/15/2015, 9:00 AM  [facility] [address] [place], [place], [address] Phone: [phone]   Fax:  [phone]

## 2015-04-16 ENCOUNTER — Encounter: Payer: Medicare HMO | Admitting: Physical Therapy

## 2015-04-19 ENCOUNTER — Ambulatory Visit: Payer: Medicare HMO | Admitting: Physical Therapy

## 2015-04-19 ENCOUNTER — Encounter: Payer: Self-pay | Admitting: Physical Therapy

## 2015-04-19 DIAGNOSIS — M25562 Pain in left knee: Secondary | ICD-10-CM

## 2015-04-19 DIAGNOSIS — M25662 Stiffness of left knee, not elsewhere classified: Secondary | ICD-10-CM

## 2015-04-19 DIAGNOSIS — R29898 Other symptoms and signs involving the musculoskeletal system: Secondary | ICD-10-CM

## 2015-04-19 NOTE — Therapy (Signed)
[facility] [address] [place], [place], [address] Phone: [phone]   Fax:  [phone]  Physical Therapy Treatment  Patient Details  Name: Stephanie Hammond MRN: [id number] Date of Birth: Sep 15, 1950 Referring Provider:  Salley Scarlet, MD  Encounter Date: 04/19/2015      PT End of Session - 04/19/15 0853    Visit Number 6   Number of Visits 18   Date for PT Re-Evaluation 05/26/15   PT Start Time 0815   PT Stop Time 0911   PT Time Calculation (min) 56 min   Activity Tolerance Patient tolerated treatment well   Behavior During Therapy [facility] for tasks assessed/performed      Past Medical History  Diagnosis Date  . IBS (irritable bowel syndrome)   . GERD (gastroesophageal reflux disease)   . Anemia   . History of GI diverticular bleed 08/2004  . Diverticulitis   . Diverticulosis   . Osteoarthritis   . Diabetes mellitus   . Blood transfusion 2009    after GI bleed  . Gout   . CAD (coronary artery disease)   . Hypertension     Past Surgical History  Procedure Laterality Date  . Ptca    . Vaginal hysterectomy    . Cholecystectomy  2003  . Replacement total knee      right   . Colonoscopy  2000    Dr. Russella Dar: marked diverticulosis, difficult procedure due to adhesions, polyps benign  . Colonoscopy  2006    Dr. Marina Goodell: severe pandiverticulosis  . Colonoscopy N/A 02/03/2013    SLF: 4 COLORECTAL POLYPS REMOVED/Moderate diverticulosis throughout the entire examined colon/Small internal hemorrhoids  . Joint replacement Right 2009    Knee Replacement Right   . Total knee arthroplasty Left 03/12/2015    Procedure: LEFT TOTAL KNEE REPLACEMENT;  Surgeon: Vickki Hearing, MD;  Location: [facility];  Service: Orthopedics;  Laterality: Left;    There were no vitals filed for this visit.  Visit Diagnosis:  Left knee pain  Knee stiffness, left  Weakness of right foot      Subjective Assessment - 04/19/15 0823    Subjective  Patient reported sore all weekend from develpoing shingles in Left LE   Patient Stated Goals Get out of pain.   Currently in Pain? Yes   Pain Score 6    Pain Location Knee   Pain Orientation Left   Pain Descriptors / Indicators Aching   Pain Type Surgical pain   Pain Onset 1 to 4 weeks ago   Pain Frequency Constant   Aggravating Factors  increased activity   Pain Relieving Factors rest            [facility] PT Assessment - 04/19/15 0001    AROM   Overall AROM  Deficits   AROM Assessment Site Knee   Right/Left Knee Left   Left Knee Extension -3   Left Knee Flexion 106   PROM   Overall PROM  Within functional limits for tasks performed;Deficits   PROM Assessment Site Knee   Right/Left Knee Left   Left Knee Extension 0   Left Knee Flexion 114                     [facility] Adult PT Treatment/Exercise - 04/19/15 0001    Knee/Hip Exercises: Aerobic   Nustep Nustep L4 x15 min for ROM    Knee/Hip Exercises: Standing   Lateral Step Up Left;Hand Hold: 2;Step Height: 6";3 sets;10  reps   Forward Step Up Left;Hand Hold: 2;Step Height: 6";3 sets;10 reps   Rocker Board 3 minutes   Occupational hygienist Action IFC   Electrical Stimulation Parameters 1-10hz    Electrical Stimulation Goals Pain   Vasopneumatic   Number Minutes Vasopneumatic  15 minutes   Vasopnuematic Location  Knee   Vasopneumatic Pressure Medium   Manual Therapy   Manual Therapy Passive ROM   Passive ROM for flexion and extension L knee                  PT Short Term Goals - 04/08/15 0945    PT SHORT TERM GOAL #1   Title Ind with an HEP.   Time 6   Period Weeks   Status Achieved           PT Long Term Goals - 04/19/15 8295    PT LONG TERM GOAL #1   Title Full active left knee extension.   Time 6   Period Weeks   Status On-going  -3 degrees   PT LONG TERM GOAL #2   Title Active left knee flexion to 115 degrees.    Time 6   Period Weeks   Status On-going  106 degrees   PT LONG TERM GOAL #3   Title Left LE strength= 5/5.   Time 6   Period Weeks   Status On-going   PT LONG TERM GOAL #4   Title Perform a reciprocating stair gait with one railing.   Time 6   Period Weeks   Status On-going   PT LONG TERM GOAL #5   Title Perform ADL's with pain not > 3/10.   Time 6   Period Weeks   Status On-going   PT LONG TERM GOAL #6   Title Walk in clinic 500 feet without an assistive device and without antalgia.   Time 6   Period Weeks   Status On-going               Plan - 04/19/15 6213    Clinical Impression Statement Patient sore in left knee and left thigh due to shingles. Patient has improved with active and passive ROM today. Patient tolerated ROM and strength exercises with no difficulty. Goals ongoing due to pain, strength and ROM deficits.   Pt will benefit from skilled therapeutic intervention in order to improve on the following deficits Pain;Decreased activity tolerance;Decreased strength;Decreased range of motion   Rehab Potential Excellent   PT Frequency 3x / week   PT Duration 6 weeks   PT Treatment/Interventions ADLs/Self Care Home Management;Cryotherapy;Electrical Stimulation;Functional mobility training;Therapeutic activities;Therapeutic exercise;Neuromuscular re-education;Patient/family education;Manual techniques;Vasopneumatic Device;Passive range of motion   PT Next Visit Plan cont with POC, continue to progress strengthening.   Consulted and Agree with Plan of Care Patient        Problem List Patient Active Problem List   Diagnosis Date Noted  . Arthritis of knee, degenerative 03/12/2015  . Arthritis of left knee   . Preoperative clearance 01/12/2015  . Gout 09/07/2014  . Back pain with left-sided radiculopathy 09/07/2014  . Pain in joint, pelvic region and thigh 06/25/2014  . Type 2 diabetes mellitus with diabetic neuropathy 02/14/2014  . Hyperlipidemia 03/12/2013   . IBS (irritable bowel syndrome) 01/09/2013  . Diabetic neuropathy 11/14/2012  . Diarrhea 09/13/2012  . Major depression, single episode 09/13/2012  . Essential hypertension, benign 06/09/2012  . Tachycardia 03/28/2012  . Diabetes with neurologic complications  03/28/2012  . DDD (degenerative disc disease), lumbar 03/28/2012  . Chronic knee pain 03/28/2012  . Obesity 03/28/2012  . Tobacco user 03/28/2012  . CAD (coronary artery disease) 03/28/2012    Hermelinda Dellen, PTA 04/19/2015, 9:20 AM  [facility] [address] [place], [place], [address] Phone: [phone]   Fax:  [phone]

## 2015-04-21 ENCOUNTER — Encounter: Payer: Medicare HMO | Admitting: Physical Therapy

## 2015-04-22 ENCOUNTER — Ambulatory Visit (INDEPENDENT_AMBULATORY_CARE_PROVIDER_SITE_OTHER): Payer: Medicare HMO | Admitting: Orthopedic Surgery

## 2015-04-22 ENCOUNTER — Encounter: Payer: Self-pay | Admitting: Physical Therapy

## 2015-04-22 ENCOUNTER — Ambulatory Visit: Payer: Medicare HMO | Admitting: Physical Therapy

## 2015-04-22 ENCOUNTER — Encounter: Payer: Self-pay | Admitting: Orthopedic Surgery

## 2015-04-22 VITALS — BP 146/94 | Ht 63.0 in | Wt 216.0 lb

## 2015-04-22 DIAGNOSIS — M25562 Pain in left knee: Secondary | ICD-10-CM

## 2015-04-22 DIAGNOSIS — R29898 Other symptoms and signs involving the musculoskeletal system: Secondary | ICD-10-CM

## 2015-04-22 DIAGNOSIS — Z4789 Encounter for other orthopedic aftercare: Secondary | ICD-10-CM

## 2015-04-22 DIAGNOSIS — M25662 Stiffness of left knee, not elsewhere classified: Secondary | ICD-10-CM

## 2015-04-22 DIAGNOSIS — Z96652 Presence of left artificial knee joint: Secondary | ICD-10-CM

## 2015-04-22 MED ORDER — OXYCODONE-ACETAMINOPHEN 7.5-325 MG PO TABS: 1.0000 | ORAL_TABLET | Freq: Four times a day (QID) | ORAL | Status: DC | PRN

## 2015-04-22 NOTE — Progress Notes (Signed)
Patient ID: LATORSHA CURLING, female   DOB: 11-23-1950, 64 y.o.   MRN: [id number]  Follow up visit  Chief Complaint  Patient presents with  . Follow-up    post op 2 left TKA, DOS 03/12/15    BP 146/94 mmHg  Ht 5\' 3"  (1.6 m)  Wt 216 lb (97.977 kg)  BMI 38.27 kg/m2  Encounter Diagnoses  Name Primary?  . Status post total left knee replacement   . Aftercare following surgery of the musculoskeletal system Yes    The patient is approximately 6 weeks after surgery doing well has some thigh soreness and some numbness around the distal portion of the incision. The incision looks clean dry and healed and intact her knee extension is normal with full strength and no extensor lag. Her flexion is approximately 115 in her knee is stable in extension  Recommend continued physical therapy follow-up in 4 weeks  Meds ordered this encounter  Medications  . oxyCODONE-acetaminophen (PERCOCET) 7.5-325 MG per tablet    Sig: Take 1 tablet by mouth every 6 (six) hours as needed for severe pain.    Dispense:  112 tablet    Refill:  0

## 2015-04-22 NOTE — Therapy (Signed)
[facility] [address] [place], [place], [address] Phone: [phone]   Fax:  [phone]  Physical Therapy Treatment  Patient Details  Name: Stephanie Hammond MRN: [id number] Date of Birth: 1951-05-29 Referring Provider:  Salley Scarlet, MD  Encounter Date: 04/22/2015      PT End of Session - 04/22/15 0850    Visit Number 7   Number of Visits 18   Date for PT Re-Evaluation 05/26/15   PT Start Time 0814   PT Stop Time 0912   PT Time Calculation (min) 58 min   Activity Tolerance Patient tolerated treatment well   Behavior During Therapy [facility] for tasks assessed/performed      Past Medical History  Diagnosis Date  . IBS (irritable bowel syndrome)   . GERD (gastroesophageal reflux disease)   . Anemia   . History of GI diverticular bleed 08/2004  . Diverticulitis   . Diverticulosis   . Osteoarthritis   . Diabetes mellitus   . Blood transfusion 2009    after GI bleed  . Gout   . CAD (coronary artery disease)   . Hypertension     Past Surgical History  Procedure Laterality Date  . Ptca    . Vaginal hysterectomy    . Cholecystectomy  2003  . Replacement total knee      right   . Colonoscopy  2000    Dr. Russella Dar: marked diverticulosis, difficult procedure due to adhesions, polyps benign  . Colonoscopy  2006    Dr. Marina Goodell: severe pandiverticulosis  . Colonoscopy N/A 02/03/2013    SLF: 4 COLORECTAL POLYPS REMOVED/Moderate diverticulosis throughout the entire examined colon/Small internal hemorrhoids  . Joint replacement Right 2009    Knee Replacement Right   . Total knee arthroplasty Left 03/12/2015    Procedure: LEFT TOTAL KNEE REPLACEMENT;  Surgeon: Vickki Hearing, MD;  Location: [facility];  Service: Orthopedics;  Laterality: Left;    There were no vitals filed for this visit.  Visit Diagnosis:  Left knee pain  Knee stiffness, left  Weakness of right foot      Subjective Assessment - 04/22/15 0830    Subjective  less pain overall   Patient Stated Goals Get out of pain.   Currently in Pain? Yes   Pain Score 2    Pain Location Knee   Pain Orientation Left   Pain Descriptors / Indicators Aching   Pain Type Surgical pain   Pain Onset 1 to 4 weeks ago   Aggravating Factors  prolong activity   Pain Relieving Factors rest            [facility] PT Assessment - 04/22/15 0001    ROM / Strength   AROM / PROM / Strength AROM;PROM   AROM   Overall AROM  Deficits;Within functional limits for tasks performed   AROM Assessment Site Knee   Right/Left Knee Left   Left Knee Extension 0   Left Knee Flexion 110   PROM   Overall PROM  Within functional limits for tasks performed;Deficits   PROM Assessment Site Knee   Right/Left Knee Left   Left Knee Extension 0   Left Knee Flexion 118   Strength   Overall Strength Deficits   Overall Strength Comments Hip 4/5, quad 4+/5, HS 4/5                     [facility] Adult PT Treatment/Exercise - 04/22/15 0001    Knee/Hip Exercises: Aerobic  Nustep Nustep L4 x15 min for ROM    Knee/Hip Exercises: Machines for Strengthening   Cybex Knee Extension 10# 2x10   Cybex Knee Flexion 20# 2x10   Knee/Hip Exercises: Supine   Straight Leg Raises Strengthening;Left;3 sets;10 reps   Knee/Hip Exercises: Sidelying   Hip ABduction Strengthening;Left;2 sets;10 reps   Electrical Stimulation   Electrical Stimulation Location knee   Electrical Stimulation Action IFC   Electrical Stimulation Parameters 1-10hz    Electrical Stimulation Goals Pain   Vasopneumatic   Number Minutes Vasopneumatic  15 minutes   Vasopnuematic Location  Knee   Vasopneumatic Pressure Medium   Manual Therapy   Manual Therapy Passive ROM   Passive ROM for flexion and extension L knee                  PT Short Term Goals - 04/08/15 0945    PT SHORT TERM GOAL #1   Title Ind with an HEP.   Time 6   Period Weeks   Status Achieved           PT Long Term Goals - 04/22/15  1610    PT LONG TERM GOAL #1   Title Full active left knee extension.   Time 6   Period Weeks   Status Achieved   PT LONG TERM GOAL #2   Title Active left knee flexion to 115 degrees.   Time 6   Period Weeks   Status On-going  110 degrees   PT LONG TERM GOAL #3   Title Left LE strength= 5/5.   Time 6   Period Weeks   Status On-going   PT LONG TERM GOAL #4   Title Perform a reciprocating stair gait with one railing.   Time 6   Period Weeks   Status On-going   PT LONG TERM GOAL #5   Title Perform ADL's with pain not > 3/10.   Time 6   Period Weeks   Status On-going               Plan - 04/22/15 9604    Clinical Impression Statement Patient continues to progress with all activities. Patient has improved strength and ROM in left knee and met knee ext goal today, others ongoing due to flexion deficit, pain and strength limitations. Patient has reported less pain overall in knee and other pain in thigh for unknown reason, may be shingles per patient.    Pt will benefit from skilled therapeutic intervention in order to improve on the following deficits Pain;Decreased activity tolerance;Decreased strength;Decreased range of motion   Rehab Potential Excellent   PT Frequency 3x / week   PT Duration 6 weeks   PT Treatment/Interventions ADLs/Self Care Home Management;Cryotherapy;Electrical Stimulation;Functional mobility training;Therapeutic activities;Therapeutic exercise;Neuromuscular re-education;Patient/family education;Manual techniques;Vasopneumatic Device;Passive range of motion   PT Next Visit Plan cont with POC per MD appt today with Dr. Fuller [place]   Consulted and Agree with Plan of Care Patient        Problem List Patient Active Problem List   Diagnosis Date Noted  . Arthritis of knee, degenerative 03/12/2015  . Arthritis of left knee   . Preoperative clearance 01/12/2015  . Gout 09/07/2014  . Back pain with left-sided radiculopathy 09/07/2014  . Pain in  joint, pelvic region and thigh 06/25/2014  . Type 2 diabetes mellitus with diabetic neuropathy 02/14/2014  . Hyperlipidemia 03/12/2013  . IBS (irritable bowel syndrome) 01/09/2013  . Diabetic neuropathy 11/14/2012  . Diarrhea 09/13/2012  . Major depression, single  episode 09/13/2012  . Essential hypertension, benign 06/09/2012  . Tachycardia 03/28/2012  . Diabetes with neurologic complications 03/28/2012  . DDD (degenerative disc disease), lumbar 03/28/2012  . Chronic knee pain 03/28/2012  . Obesity 03/28/2012  . Tobacco user 03/28/2012  . CAD (coronary artery disease) 03/28/2012    Cathie Hoops, PTA 04/22/2015 6:23 PM [place] MPT  [place], [place], PTA 04/22/2015, 6:23 PM  [facility] [address] [place], [place], [address] Phone: [phone]   Fax:  [phone]

## 2015-04-26 ENCOUNTER — Ambulatory Visit: Payer: Medicare HMO | Admitting: Physical Therapy

## 2015-04-26 ENCOUNTER — Encounter: Payer: Self-pay | Admitting: Physical Therapy

## 2015-04-26 DIAGNOSIS — M25662 Stiffness of left knee, not elsewhere classified: Secondary | ICD-10-CM

## 2015-04-26 DIAGNOSIS — R29898 Other symptoms and signs involving the musculoskeletal system: Secondary | ICD-10-CM

## 2015-04-26 DIAGNOSIS — M25562 Pain in left knee: Secondary | ICD-10-CM

## 2015-04-26 NOTE — Therapy (Signed)
[facility] [address] [place], [place], [address] Phone: [phone]   Fax:  [phone]  Physical Therapy Treatment  Patient Details  Name: Stephanie Hammond MRN: [id number] Date of Birth: 1950-11-07 Referring Provider:  Salley Scarlet, MD  Encounter Date: 04/26/2015      PT End of Session - 04/26/15 0838    Visit Number 8   Number of Visits 18   Date for PT Re-Evaluation 05/26/15   PT Start Time 0815   PT Stop Time 0911   PT Time Calculation (min) 56 min   Activity Tolerance Patient tolerated treatment well   Behavior During Therapy [facility] for tasks assessed/performed      Past Medical History  Diagnosis Date  . IBS (irritable bowel syndrome)   . GERD (gastroesophageal reflux disease)   . Anemia   . History of GI diverticular bleed 08/2004  . Diverticulitis   . Diverticulosis   . Osteoarthritis   . Diabetes mellitus   . Blood transfusion 2009    after GI bleed  . Gout   . CAD (coronary artery disease)   . Hypertension     Past Surgical History  Procedure Laterality Date  . Ptca    . Vaginal hysterectomy    . Cholecystectomy  2003  . Replacement total knee      right   . Colonoscopy  2000    Dr. Russella Dar: marked diverticulosis, difficult procedure due to adhesions, polyps benign  . Colonoscopy  2006    Dr. Marina Goodell: severe pandiverticulosis  . Colonoscopy N/A 02/03/2013    SLF: 4 COLORECTAL POLYPS REMOVED/Moderate diverticulosis throughout the entire examined colon/Small internal hemorrhoids  . Joint replacement Right 2009    Knee Replacement Right   . Total knee arthroplasty Left 03/12/2015    Procedure: LEFT TOTAL KNEE REPLACEMENT;  Surgeon: Vickki Hearing, MD;  Location: [facility];  Service: Orthopedics;  Laterality: Left;    There were no vitals filed for this visit.  Visit Diagnosis:  Left knee pain  Knee stiffness, left  Weakness of right foot      Subjective Assessment - 04/26/15 0826    Subjective  Patient went to MD and he knee looked good and was healing well and to continue therapy. patient reported knee giving way over weekend for unknown reason.   Patient Stated Goals Get out of pain.   Currently in Pain? Yes   Pain Score 7    Pain Orientation Left   Pain Descriptors / Indicators Sore   Pain Type Surgical pain   Pain Onset More than a month ago   Pain Frequency Constant   Aggravating Factors  prolong activity   Pain Relieving Factors rest            [facility] PT Assessment - 04/26/15 0001    AROM   Overall AROM  Within functional limits for tasks performed   AROM Assessment Site Knee   Right/Left Knee Left   Left Knee Extension 0   Left Knee Flexion 115   PROM   Overall PROM  Within functional limits for tasks performed   PROM Assessment Site Knee   Right/Left Knee Left   Left Knee Extension 0   Left Knee Flexion 120                     [facility] Adult PT Treatment/Exercise - 04/26/15 0001    Knee/Hip Exercises: Aerobic   Nustep Nustep L5 x15 min   Knee/Hip  Exercises: Machines for Strengthening   Cybex Knee Extension 10# 3x10   Cybex Knee Flexion 20# 3x10   Knee/Hip Exercises: Standing   Lateral Step Up Left;Hand Hold: 2;Step Height: 6";3 sets;10 reps   Forward Step Up Left;Hand Hold: 2;Step Height: 6";3 sets;10 reps   Rocker Board 3 minutes   Knee/Hip Exercises: Supine   Straight Leg Raise with External Rotation Strengthening;Left;3 sets;10 reps   Programme researcher, broadcasting/film/video Location knee   Electrical Stimulation Action IFC   Electrical Stimulation Parameters 1-10HZ    Electrical Stimulation Goals Pain   Vasopneumatic   Number Minutes Vasopneumatic  15 minutes   Vasopnuematic Location  Knee   Vasopneumatic Pressure Medium   Manual Therapy   Manual Therapy Passive ROM   Passive ROM gentle low load holds foe flexion                  PT Short Term Goals - 04/08/15 0945    PT SHORT TERM GOAL #1   Title Ind with an  HEP.   Time 6   Period Weeks   Status Achieved           PT Long Term Goals - 04/26/15 0857    PT LONG TERM GOAL #1   Title Full active left knee extension.   Time 6   Period Weeks   Status Achieved   PT LONG TERM GOAL #2   Title Active left knee flexion to 115 degrees.   Time 6   Period Weeks   Status Achieved   PT LONG TERM GOAL #3   Title Left LE strength= 5/5.   Time 6   Period Weeks   Status On-going   PT LONG TERM GOAL #4   Title Perform a reciprocating stair gait with one railing.   Time 6   Period Weeks   Status On-going   PT LONG TERM GOAL #5   Title Perform ADL's with pain not > 3/10.   Time 6   Period Weeks   Status On-going   PT LONG TERM GOAL #6   Title Walk in clinic 500 feet without an assistive device and without antalgia.   Time 6   Period Weeks   Status On-going               Plan - 04/26/15 4098    Clinical Impression Statement Patient progressing with all activities today. Has improved with ROM and met LTG #2 today, others ongoing due to pain and strength limitations. patient went to MD and is to cont for a few more weeks. Patient reported knee giving way while walking in house and some increased pain since.    Pt will benefit from skilled therapeutic intervention in order to improve on the following deficits Pain;Decreased activity tolerance;Decreased strength;Decreased range of motion   Rehab Potential Excellent   PT Frequency 3x / week   PT Duration 6 weeks   PT Treatment/Interventions ADLs/Self Care Home Management;Cryotherapy;Electrical Stimulation;Functional mobility training;Therapeutic activities;Therapeutic exercise;Neuromuscular re-education;Patient/family education;Manual techniques;Vasopneumatic Device;Passive range of motion   PT Next Visit Plan cont with POC for strengthening ( next appt 05/20/15 Dr. Fuller Canada)   Consulted and Agree with Plan of Care Patient        Problem List Patient Active Problem List    Diagnosis Date Noted  . Arthritis of knee, degenerative 03/12/2015  . Arthritis of left knee   . Preoperative clearance 01/12/2015  . Gout 09/07/2014  . Back pain with left-sided radiculopathy 09/07/2014  .  Pain in joint, pelvic region and thigh 06/25/2014  . Type 2 diabetes mellitus with diabetic neuropathy 02/14/2014  . Hyperlipidemia 03/12/2013  . IBS (irritable bowel syndrome) 01/09/2013  . Diabetic neuropathy 11/14/2012  . Diarrhea 09/13/2012  . Major depression, single episode 09/13/2012  . Essential hypertension, benign 06/09/2012  . Tachycardia 03/28/2012  . Diabetes with neurologic complications 03/28/2012  . DDD (degenerative disc disease), lumbar 03/28/2012  . Chronic knee pain 03/28/2012  . Obesity 03/28/2012  . Tobacco user 03/28/2012  . CAD (coronary artery disease) 03/28/2012    Hermelinda Dellen, PTA 04/26/2015, 9:14 AM  [facility] [address] [place], [place], [address] Phone: [phone]   Fax:  [phone]

## 2015-04-27 ENCOUNTER — Other Ambulatory Visit: Payer: Self-pay | Admitting: Family Medicine

## 2015-04-29 ENCOUNTER — Encounter: Payer: Medicare HMO | Admitting: Physical Therapy

## 2015-04-30 ENCOUNTER — Encounter: Payer: Medicare HMO | Admitting: *Deleted

## 2015-05-06 ENCOUNTER — Ambulatory Visit: Payer: Medicare HMO | Admitting: Orthopedic Surgery

## 2015-05-06 ENCOUNTER — Ambulatory Visit: Payer: Medicare HMO | Admitting: Physical Therapy

## 2015-05-06 ENCOUNTER — Encounter: Payer: Self-pay | Admitting: Physical Therapy

## 2015-05-06 DIAGNOSIS — M25562 Pain in left knee: Secondary | ICD-10-CM | POA: Diagnosis not present

## 2015-05-06 DIAGNOSIS — M25662 Stiffness of left knee, not elsewhere classified: Secondary | ICD-10-CM

## 2015-05-06 NOTE — Therapy (Addendum)
[facility] [address] [place], [place], [address] Phone: [phone]   Fax:  [phone]  Physical Therapy Treatment  Patient Details  Name: Stephanie Hammond MRN: [id number] Date of Birth: [birth year] Referring Provider:  Salley Scarlet, MD  Encounter Date: 05/06/2015      PT End of Session - 05/06/15 1042    Visit Number 9   Number of Visits 18   Date for PT Re-Evaluation 05/26/15   PT Start Time 1039   PT Stop Time 1124   PT Time Calculation (min) 45 min   Activity Tolerance Patient tolerated treatment well   Behavior During Therapy [facility] for tasks assessed/performed      Past Medical History  Diagnosis Date  . IBS (irritable bowel syndrome)   . GERD (gastroesophageal reflux disease)   . Anemia   . History of GI diverticular bleed 08/2004  . Diverticulitis   . Diverticulosis   . Osteoarthritis   . Diabetes mellitus   . Blood transfusion 2009    after GI bleed  . Gout   . CAD (coronary artery disease)   . Hypertension     Past Surgical History  Procedure Laterality Date  . Ptca    . Vaginal hysterectomy    . Cholecystectomy  2003  . Replacement total knee      right   . Colonoscopy  2000    Dr. Russella Dar: marked diverticulosis, difficult procedure due to adhesions, polyps benign  . Colonoscopy  2006    Dr. Marina Goodell: severe pandiverticulosis  . Colonoscopy N/A 02/03/2013    SLF: 4 COLORECTAL POLYPS REMOVED/Moderate diverticulosis throughout the entire examined colon/Small internal hemorrhoids  . Joint replacement Right 2009    Knee Replacement Right   . Total knee arthroplasty Left 03/12/2015    Procedure: LEFT TOTAL KNEE REPLACEMENT;  Surgeon: Vickki Hearing, MD;  Location: [facility];  Service: Orthopedics;  Laterality: Left;    There were no vitals filed for this visit.  Visit Diagnosis:  Left knee pain  Knee stiffness, left      Subjective Assessment - 05/06/15 1041    Subjective Patient reports L knee  has been trying to hurt the last few days. Reports L knee began swelling yesterday.   Patient Stated Goals Get out of pain.   Currently in Pain? Yes   Pain Score 4    Pain Location Knee   Pain Orientation Left   Pain Descriptors / Indicators Sore   Pain Type Surgical pain   Pain Onset More than a month ago   Pain Frequency Constant            [facility] PT Assessment - 05/06/15 0001    Assessment   Medical Diagnosis left total knee replacement.   Onset Date/Surgical Date 03/12/15   Next MD Visit 05/20/2015  Dr. Lilly Cove Adult PT Treatment/Exercise - 05/06/15 0001    Knee/Hip Exercises: Aerobic   Stationary Bike Seat 4 x8 min   Knee/Hip Exercises: Machines for Strengthening   Cybex Knee Extension 10# 3x10   Cybex Knee Flexion 20# 3x10   Knee/Hip Exercises: Standing   Step Down Left;3 sets;10 reps;Hand Hold: 2;Step Height: 4"   Rocker Board 3 minutes   Knee/Hip Exercises: Supine   Straight Leg Raise with External Rotation Strengthening;Left;3 sets;10 reps   Modalities   Modalities Engineer, manufacturing  Electrical Stimulation Location L knee    Electrical Stimulation Action IFC   Electrical Stimulation Parameters 1-10 Hz x15 min   Electrical Stimulation Goals Pain;Edema   Vasopneumatic   Number Minutes Vasopneumatic  15 minutes   Vasopnuematic Location  Knee   Vasopneumatic Pressure Medium   Vasopneumatic Temperature  49                  PT Short Term Goals - 04/08/15 0945    PT SHORT TERM GOAL #1   Title Ind with an HEP.   Time 6   Period Weeks   Status Achieved           PT Long Term Goals - 05/06/15 1055    PT LONG TERM GOAL #1   Title Full active left knee extension.   Time 6   Period Weeks   Status Achieved   PT LONG TERM GOAL #2   Title Active left knee flexion to 115 degrees.   Time 6   Period Weeks   Status Achieved   PT LONG TERM GOAL #3   Title Left LE  strength= 5/5.   Time 6   Period Weeks   Status On-going   PT LONG TERM GOAL #4   Title Perform a reciprocating stair gait with one railing.   Time 6   Period Weeks   Status Achieved   PT LONG TERM GOAL #5   Title Perform ADL's with pain not > 3/10.   Time 6   Period Weeks   Status Achieved   PT LONG TERM GOAL #6   Title Walk in clinic 500 feet without an assistive device and without antalgia.   Time 6   Period Weeks   Status On-going               Plan - 05/06/15 1109    Clinical Impression Statement Patient is progressing well with PT regarding strengthening although she had reports of increased soreness and edema prior to today's treatment. Completed all exercises fairly well although she may require more instruction regarding step down technique. Achieved LT goal  #4 (reciprical stair gait) and LT goal #5 (ADLs with pain less than 3/10) today. Normal modaliites response noted following removal of the modalities.    Pt will benefit from skilled therapeutic intervention in order to improve on the following deficits Pain;Decreased activity tolerance;Decreased strength;Decreased range of motion   Rehab Potential Excellent   PT Frequency 3x / week   PT Duration 6 weeks   PT Treatment/Interventions ADLs/Self Care Home Management;Cryotherapy;Electrical Stimulation;Functional mobility training;Therapeutic activities;Therapeutic exercise;Neuromuscular re-education;Patient/family education;Manual techniques;Vasopneumatic Device;Passive range of motion   PT Next Visit Plan cont with POC for strengthening ( next appt 05/20/15 Dr. Fuller Canada)   Consulted and Agree with Plan of Care Patient        Problem List Patient Active Problem List   Diagnosis Date Noted  . Arthritis of knee, degenerative 03/12/2015  . Arthritis of left knee   . Preoperative clearance 01/12/2015  . Gout 09/07/2014  . Back pain with left-sided radiculopathy 09/07/2014  . Pain in joint, pelvic  region and thigh 06/25/2014  . Type 2 diabetes mellitus with diabetic neuropathy 02/14/2014  . Hyperlipidemia 03/12/2013  . IBS (irritable bowel syndrome) 01/09/2013  . Diabetic neuropathy 11/14/2012  . Diarrhea 09/13/2012  . Major depression, single episode 09/13/2012  . Essential hypertension, benign 06/09/2012  . Tachycardia 03/28/2012  . Diabetes with neurologic complications 03/28/2012  . DDD (degenerative disc disease), lumbar  03/28/2012  . Chronic knee pain 03/28/2012  . Obesity 03/28/2012  . Tobacco user 03/28/2012  . CAD (coronary artery disease) 03/28/2012    Evelene Croon, PTA 05/06/2015, 12:05 PM  [facility] [address] [place], [place], [address] Phone: [phone]   Fax:  [phone]  PHYSICAL THERAPY DISCHARGE SUMMARY  Visits from Start of Care: 9.  Current functional level related to goals / functional outcomes: Please see above.   Remaining deficits: Knee weakness.   Education / Equipment: HEP. Plan: Patient agrees to discharge.  Patient goals were partially met. Patient is being discharged due to being pleased with the current functional level.  ?????     [place]aly Applegate MPT

## 2015-05-07 ENCOUNTER — Encounter: Payer: Medicare HMO | Admitting: *Deleted

## 2015-05-13 ENCOUNTER — Encounter: Payer: Medicare HMO | Admitting: Physical Therapy

## 2015-05-14 ENCOUNTER — Encounter: Payer: Medicare HMO | Admitting: Physical Therapy

## 2015-05-20 ENCOUNTER — Ambulatory Visit (INDEPENDENT_AMBULATORY_CARE_PROVIDER_SITE_OTHER): Payer: Self-pay | Admitting: Orthopedic Surgery

## 2015-05-20 VITALS — BP 141/83 | Ht 63.0 in | Wt 216.0 lb

## 2015-05-20 DIAGNOSIS — Z4789 Encounter for other orthopedic aftercare: Secondary | ICD-10-CM

## 2015-05-20 MED ORDER — OXYCODONE-ACETAMINOPHEN 7.5-325 MG PO TABS: 1.0000 | ORAL_TABLET | Freq: Four times a day (QID) | ORAL | Status: DC | PRN

## 2015-05-20 NOTE — Progress Notes (Signed)
Patient ID: Stephanie Hammond, female   DOB: 1951-02-28, 64 y.o.   MRN: [id number]  Follow up visit  Chief Complaint  Patient presents with  . Follow-up    4 week recheck on left knee replacement. DOS 03-12-15.    BP 141/83 mmHg  Ht 5\' 3"  (1.6 m)  Wt 216 lb (97.977 kg)  BMI 38.27 kg/m2   she is now  10 weeks postop left total knee   she is having some medial and lateral pelvic peripatellar pain as well as some radicular pain in her left leg which is occasional  Her incision looks good she has some sensitivity to the incision   she is full extension 120 of flexion knee is stable in extension and flexion  Return 1 month   Warnings given about the medication  Meds ordered this encounter  Medications  . oxyCODONE-acetaminophen (PERCOCET) 7.5-325 MG tablet    Sig: Take 1 tablet by mouth every 6 (six) hours as needed for severe pain.    Dispense:  112 tablet    Refill:  0

## 2015-06-09 ENCOUNTER — Telehealth: Payer: Self-pay | Admitting: Orthopedic Surgery

## 2015-06-09 ENCOUNTER — Ambulatory Visit (INDEPENDENT_AMBULATORY_CARE_PROVIDER_SITE_OTHER): Payer: Medicare HMO | Admitting: Family Medicine

## 2015-06-09 ENCOUNTER — Encounter: Payer: Self-pay | Admitting: Family Medicine

## 2015-06-09 ENCOUNTER — Other Ambulatory Visit: Payer: Self-pay | Admitting: Family Medicine

## 2015-06-09 VITALS — BP 136/74 | HR 78 | Temp 97.9°F | Resp 18 | Ht 63.0 in | Wt 207.0 lb

## 2015-06-09 DIAGNOSIS — M25472 Effusion, left ankle: Secondary | ICD-10-CM

## 2015-06-09 DIAGNOSIS — E1140 Type 2 diabetes mellitus with diabetic neuropathy, unspecified: Secondary | ICD-10-CM | POA: Diagnosis not present

## 2015-06-09 DIAGNOSIS — G542 Cervical root disorders, not elsewhere classified: Secondary | ICD-10-CM

## 2015-06-09 MED ORDER — TIZANIDINE HCL 4 MG PO TABS: 4.0000 mg | ORAL_TABLET | Freq: Four times a day (QID) | ORAL | Status: DC | PRN

## 2015-06-09 MED ORDER — METHYLPREDNISOLONE 4 MG PO TBPK: ORAL_TABLET | ORAL | Status: DC

## 2015-06-09 NOTE — Telephone Encounter (Signed)
Call received from patient relaying new problem of shoulder pain, no injury; inquiring about appointment for tomorrow.  Relayed no openings available (several slots are double-booked); offered next available date 06/24/15, for which patient is already scheduled - for total knee surgery follow up.  States will call her primary care first, then call back if need to schedule for shoulder.

## 2015-06-09 NOTE — Patient Instructions (Signed)
Steroids given Muscle relaxers  Continue percocet Call if not improved and xray of neck will be done F/U 3 months

## 2015-06-09 NOTE — Progress Notes (Signed)
Patient ID: Stephanie Hammond, female   DOB: [birth year], 64 y.o.   MRN: [id number]   Subjective:    Patient ID: Stephanie Hammond, female    DOB: 08-12-1950, 64 y.o.   MRN: [id number]  Patient presents for Cervical Neck Pain and L Ankle Pain    Right sidedNeck pain for the past few days. She does not memory particular injury. She has a tightness when she turns her neck to the left feels like there is something pinching into her shoulder. She denies any tingling or numbness in her right hand.  denies any cough chest pain recently. She is been taking her Percocet which she has for her osteoporosis arthritis of knees status post surgery.  She also notices swelling above her left ankle. It is not tender but she notices that it is there. She denies any injury to this area either. She noticed it at least a week ago.  DM- CBG have been less than 200, at times she will give 10 units of lantus, weight down 9lbs.   Review Of Systems:  GEN- denies fatigue, fever, weight loss,weakness, recent illness HEENT- denies eye drainage, change in vision, nasal discharge, CVS- denies chest pain, palpitations RESP- denies SOB, cough, wheeze ABD- denies N/V, change in stools, abd pain GU- denies dysuria, hematuria, dribbling, incontinence MSK- + joint pain, muscle aches, injury Neuro- denies headache, dizziness, syncope, seizure activity       Objective:    BP 136/74 mmHg  Pulse 78  Temp(Src) 97.9 F (36.6 C) (Oral)  Resp 18  Ht 5\' 3"  (1.6 m)  Wt 207 lb (93.895 kg)  BMI 36.68 kg/m2 GEN- NAD, alert and oriented x3 HEENT- PERRL, EOMI, non injected sclera, pink conjunctiva, MMM, oropharynx clear Neck- Supple, no thyromegaly , TTP along trapezius into right shoulder, mild swelling over area CVS- RRR, no murmur RESP-CTAB MSK- fair ROM, pain with lateral rotation to left, pain with neer's, neg empty can, biceps in tact, pain with palpation over anterior shoulder EXT- - localized swelling about 2 inches  above lateral malleous, non fluctance,no erythema, no cords palpated no varicose veins  Pulses- Radial, DP- 2+        Assessment & Plan:      Problem List Items Addressed This Visit    Type 2 diabetes mellitus with diabetic neuropathy ([place]) - Primary   Relevant Orders   CBC with Differential/Platelet   Comprehensive metabolic panel   Hemoglobin A1c    Other Visit Diagnoses    Pinched cervical nerve root         Concern for pinched nerve with acute pain, trial of medrol dosepak, zanaflex, and percocet. No improvement xray of c spine    Relevant Medications    tiZANidine (ZANAFLEX) 4 MG tablet    Left ankle swelling        ? lipoma, or localized muscle injury, very odd place of swelling, will see if anti-inflammatory helps, she states she has a knot in her thigh as well that is a lipoma. Consider [place] if not impoved        Note: This dictation was prepared with Dragon dictation along with smaller phrase technology. Any transcriptional errors that result from this process are unintentional.

## 2015-06-09 NOTE — Telephone Encounter (Signed)
90 day supply denied.

## 2015-06-09 NOTE — Assessment & Plan Note (Signed)
A1C at goal previously, recheck labs, continue MTF

## 2015-06-09 NOTE — Telephone Encounter (Signed)
No 90 day supply

## 2015-06-09 NOTE — Telephone Encounter (Signed)
Ok to refill 

## 2015-06-10 LAB — COMPREHENSIVE METABOLIC PANEL
ALK PHOS: 122 U/L (ref 33–130)
ALT: 16 U/L (ref 6–29)
AST: 17 U/L (ref 10–35)
Albumin: 3.9 g/dL (ref 3.6–5.1)
BUN: 12 mg/dL (ref 7–25)
CO2: 19 mmol/L — ABNORMAL LOW (ref 20–31)
CREATININE: 0.73 mg/dL (ref 0.50–0.99)
Calcium: 9 mg/dL (ref 8.6–10.4)
Chloride: 106 mmol/L (ref 98–110)
Glucose, Bld: 166 mg/dL — ABNORMAL HIGH (ref 70–99)
Potassium: 4.9 mmol/L (ref 3.5–5.3)
SODIUM: 137 mmol/L (ref 135–146)
TOTAL PROTEIN: 6.8 g/dL (ref 6.1–8.1)
Total Bilirubin: 0.3 mg/dL (ref 0.2–1.2)

## 2015-06-10 LAB — CBC WITH DIFFERENTIAL/PLATELET
BASOS PCT: 1 % (ref 0–1)
Basophils Absolute: 0.1 10*3/uL (ref 0.0–0.1)
EOS ABS: 0.2 10*3/uL (ref 0.0–0.7)
Eosinophils Relative: 3 % (ref 0–5)
HCT: 37.9 % (ref 36.0–46.0)
Hemoglobin: 12.6 g/dL (ref 12.0–15.0)
LYMPHS ABS: 2.7 10*3/uL (ref 0.7–4.0)
Lymphocytes Relative: 35 % (ref 12–46)
MCH: 30 pg (ref 26.0–34.0)
MCHC: 33.2 g/dL (ref 30.0–36.0)
MCV: 90.2 fL (ref 78.0–100.0)
MPV: 10.3 fL (ref 8.6–12.4)
Monocytes Absolute: 0.5 10*3/uL (ref 0.1–1.0)
Monocytes Relative: 7 % (ref 3–12)
Neutro Abs: 4.1 10*3/uL (ref 1.7–7.7)
Neutrophils Relative %: 54 % (ref 43–77)
PLATELETS: 385 10*3/uL (ref 150–400)
RBC: 4.2 MIL/uL (ref 3.87–5.11)
RDW: 14.9 % (ref 11.5–15.5)
WBC: 7.6 10*3/uL (ref 4.0–10.5)

## 2015-06-10 LAB — HEMOGLOBIN A1C
Hgb A1c MFr Bld: 5.7 % — ABNORMAL HIGH (ref <5.7)
Mean Plasma Glucose: 117 mg/dL — ABNORMAL HIGH (ref <117)

## 2015-06-24 ENCOUNTER — Encounter: Payer: Self-pay | Admitting: Orthopedic Surgery

## 2015-06-24 ENCOUNTER — Ambulatory Visit (INDEPENDENT_AMBULATORY_CARE_PROVIDER_SITE_OTHER): Payer: Medicare HMO | Admitting: Orthopedic Surgery

## 2015-06-24 VITALS — BP 125/77 | Ht 63.0 in | Wt 207.0 lb

## 2015-06-24 DIAGNOSIS — Z96652 Presence of left artificial knee joint: Secondary | ICD-10-CM

## 2015-06-24 MED ORDER — OXYCODONE-ACETAMINOPHEN 7.5-325 MG PO TABS: 1.0000 | ORAL_TABLET | Freq: Four times a day (QID) | ORAL | Status: DC | PRN

## 2015-06-24 NOTE — Progress Notes (Signed)
Chief Complaint  Patient presents with  . Follow-up    1 month follow up Left TKA, DOS 03/12/15    3 months postop from a left total knee.  Patient has some numbness along the lateral border of her incision which is chronic and persistent but she is functioning well no cane minimal discomfort she does take chronic Percocet 7.5 mg prior to surgery and continues with that   BP 125/77 mmHg  Ht 5\' 3"  (1.6 m)  Wt 207 lb (93.895 kg)  BMI 36.68 kg/m2 She is awake alert and oriented 3  her mood is pleasant  she walks without a cane  her knee looks good her incision is clean dry and healed intact no erythema.  Knee flexion 120.  No swelling of the joint    Review of systems she said she had some shoulder discomfort that she thinks was a flare up of arthritis, I think it was more likely bursitis   Follow-up next year for x-rays  Data for medical decision-making Established problem stable improved Written prescription  Meds ordered this encounter  Medications  . oxyCODONE-acetaminophen (PERCOCET) 7.5-325 MG tablet    Sig: Take 1 tablet by mouth every 6 (six) hours as needed for severe pain.    Dispense:  120 tablet    Refill:  0

## 2015-06-25 ENCOUNTER — Other Ambulatory Visit: Payer: Self-pay | Admitting: Family Medicine

## 2015-06-25 DIAGNOSIS — R69 Illness, unspecified: Secondary | ICD-10-CM | POA: Diagnosis not present

## 2015-06-25 NOTE — Telephone Encounter (Signed)
Refill appropriate and filled per protocol. 

## 2015-07-13 ENCOUNTER — Other Ambulatory Visit: Payer: Self-pay | Admitting: Family Medicine

## 2015-07-13 NOTE — Telephone Encounter (Signed)
Refill appropriate and filled per protocol. 

## 2015-07-30 ENCOUNTER — Telehealth: Payer: Self-pay | Admitting: Family Medicine

## 2015-07-30 MED ORDER — OXYCODONE-ACETAMINOPHEN 7.5-325 MG PO TABS: 1.0000 | ORAL_TABLET | Freq: Three times a day (TID) | ORAL | Status: DC | PRN

## 2015-07-30 NOTE — Telephone Encounter (Signed)
Prescription printed and patient made aware to come to office to pick up.  

## 2015-07-30 NOTE — Telephone Encounter (Signed)
MD please advise

## 2015-07-30 NOTE — Telephone Encounter (Signed)
Patient calling for a refill for medication that Dr. Aline Brochure use to prescribe to her however she is no longer under his care. She states Dr. Buelah Manis used to prescribe them to her.  oxyCODONE-acetaminophen (PERCOCET) 7.5-325 MG  [phone]

## 2015-07-30 NOTE — Telephone Encounter (Signed)
Okay to refill 7.5-325mg  1 po Q8 hr prn #100  I did change direction a little, no more than 3 a day

## 2015-08-09 ENCOUNTER — Other Ambulatory Visit: Payer: Self-pay | Admitting: Family Medicine

## 2015-08-10 NOTE — Telephone Encounter (Signed)
Refill appropriate and filled per protocol. 

## 2015-09-01 ENCOUNTER — Ambulatory Visit: Payer: Medicare HMO | Admitting: Family Medicine

## 2015-09-02 ENCOUNTER — Telehealth: Payer: Self-pay

## 2015-09-02 ENCOUNTER — Ambulatory Visit (INDEPENDENT_AMBULATORY_CARE_PROVIDER_SITE_OTHER): Payer: Medicare HMO | Admitting: Gastroenterology

## 2015-09-02 ENCOUNTER — Encounter: Payer: Self-pay | Admitting: Gastroenterology

## 2015-09-02 VITALS — BP 122/70 | HR 116 | Temp 97.6°F | Ht 62.0 in | Wt 204.6 lb

## 2015-09-02 DIAGNOSIS — R197 Diarrhea, unspecified: Secondary | ICD-10-CM

## 2015-09-02 MED ORDER — NORTRIPTYLINE HCL 10 MG PO CAPS: ORAL_CAPSULE | ORAL | Status: DC

## 2015-09-02 MED ORDER — ELUXADOLINE 75 MG PO TABS: 75.0000 mg | ORAL_TABLET | Freq: Two times a day (BID) | ORAL | Status: DC

## 2015-09-02 NOTE — Telephone Encounter (Signed)
Pt was given samples when she was here. Will work on [employer]

## 2015-09-02 NOTE — Assessment & Plan Note (Addendum)
CHRONIC SYMPTOMS, ACUTELY WORSE OVER PAST 2-3 MOS. SYMPTOMS NOT CONTROLLED AND MOST LIKELY DUE TO IBS-D, LESS LIKELY  BILE SALT INDUCED DIARRHEA, DIVERTICULITIS LESS LIKELY THYROID DISTURBANCE, C DIFF COLITIS, OR IBD.  DISCUSSED PROCEDURE, BENEFITS, RISKS, AND MANAGEMENT OF CHRONIC ABDOMINAL PAIN AND DIARRHEA. PT DECLINES CT AT THIS TIME. SEE DR. Buelah Manis FOR NARCOTICS. SUBMIT A STOOL SAMPLE. DRINK WATER TO KEEP YOUR URINE LIGHT YELLOW. FOLLOW A LOW FAT DIET.  HANDOUT GIVEN. TRY VIBERZI. TAKE ONE TABLET  DAILY for 7 days then increase to twice daily. USE IMODIUM 1 OR 2 TIMES A DAY IF NEEDED. MED SIDE EFFECTS DISCUSSED. CALL IF GOES MORE THAN 4 DAYS WITHOUT A BOWEL MOVEMENT OR YOU DEVELOP INCREASE IN NAUSEA, VOMITING, OR ABDOMINAL PAIN. ADD PAMELOR 1 AT BEDTIME FOR 7 DAYS THEN 2 AT BEDTIME. IT MAY CAUSE DROWSINESS, DRY EYES/MOUTH, BLURRY VISION, OR DIFFICULTY URINATING. MED SIDE EFFECTS DISCUSSED. PLEASE  CALL IN 14 DAYS IF SYMPTOMS ARE NOT IMPROVED.  FOLLOW UP IN 4 MOS.   GREATER THAN 50% WAS SPENT IN COUNSELING & COORDINATION OF CARE WITH THE PATIENT: DISCUSSED DIFFERENTIAL DIAGNOSIS, PROCEDURE, BENEFITS, RISKS, AND MANAGEMENT OF ACUTE ON CHRONIC ABDOMINAL PAIN AND DIARRHEA TOTAL ENCOUNTER TIME: 70 MINS.

## 2015-09-02 NOTE — Progress Notes (Signed)
Subjective:    Patient ID: Stephanie Hammond, female    DOB: [birth year], 65 y.o.   MRN: [id number] Stephanie Antis, MD  HPI PAIN AND DIARRHEA FOR AWHILE AND FEEL LIKE IT'S GETTING WORSE, CAN'T SNEEZE, COUGH, OR GO OUT TO EAT. WHAT IS SHE ABLE TO EAT. LOST WEIGHT FROM 2014 218 LBS TO 204 LBS TODAY. LAST CT 2015. CAN TOLERATE PB/CRACKERS. BEEN WALKING AROUND WITH PADS. CARRIES PEPTO BISMOL IN HER PURSE. SUN HAD TO PULL OVER IN [place] AND GO TO THE BATHROOM. HAS THROWN AWAY 15 PAIRS OF PANTIES. MILK: NONE, CHEESE: NONE. ICE CREAM: NONE. YESTERDAY ATE: [place], PB/CRACKERS, SPRITE. SUBJECTIVE CHILLS. PASSED OUT SUN AFTER ABDOMINAL PAIN. CAME OUT BATHROOM AND PASSED OUT. CAN'T REMEMBER WHEN SHE HAD A NL FORMED STOOL. DENIES STRESS. NO BRBPR,  MELENA, OR TRAVEL/ABX. TAKING TYLENOL 1500 MG TO EASE THE PAIN. BLACK STOOLS WHEN SHE TAKES PEPTO. VOMITED SUN. BMs 6 OR MORE(SML, SQUIRTS). CHEWING SUGAR FREE GUM BUT DOESN'T USU. CHEW GUM. TOLERATE PEPPERMINT CANDY. HEARTBURN: CONTROLLED. MOSTLY LOWER RIGHT ACROSS BOTTOM, RAZOR OR POKER LIKE. CAN'T SLEEP DUE TO ABDOMINAL PAIN. FLORA Q DID NOT HELP.  PT DENIES FEVER, CHILLS, HEMATOCHEZIA, HEMATEMESIS, nausea, melena, CHEST PAIN, SHORTNESS OF BREATH, CHANGE IN BOWEL IN HABITS, constipation, problems swallowing, OR heartburn or indigestion.  Past Medical History  Diagnosis Date  . IBS (irritable bowel syndrome)   . GERD (gastroesophageal reflux disease)   . Anemia   . History of GI diverticular bleed 08/2004  . Diverticulitis   . Diverticulosis   . Osteoarthritis   . Diabetes mellitus   . Blood transfusion 2009    after GI bleed  . Gout   . CAD (coronary artery disease)   . Hypertension     Past Surgical History  Procedure Laterality Date  . Ptca    . Vaginal hysterectomy    . Cholecystectomy  2003  . Replacement total knee      right   . Colonoscopy  2000    Dr. Russella Dar: marked diverticulosis, difficult procedure due to adhesions,  polyps benign  . Colonoscopy  2006    Dr. Marina Goodell: severe pandiverticulosis  . Colonoscopy N/A 02/03/2013    SLF: 4 COLORECTAL POLYPS REMOVED/Moderate diverticulosis throughout the entire examined colon/Small internal hemorrhoids  . Joint replacement Right 2009    Knee Replacement Right   . Total knee arthroplasty Left 03/12/2015    Procedure: LEFT TOTAL KNEE REPLACEMENT;  Surgeon: Vickki Hearing, MD;  Location: [facility];  Service: Orthopedics;  Laterality: Left;   Allergies  Allergen Reactions  . Ace Inhibitors Cough  . Bentyl [Dicyclomine Hcl] Other (See Comments)    Blurry vision  . Penicillins Itching and Swelling   Current Outpatient Prescriptions  Medication Sig Dispense Refill  . allopurinol (ZYLOPRIM) 300 MG tablet TAKE 1 TABLET BY MOUTH EVERY DAY    . amLODipine (NORVASC) 10 MG tablet TAKE 1 TABLET BY MOUTH EVERY DAY    . aspirin EC 325 MG EC tablet Take 1 tablet (325 mg total) by mouth 2 (two) times daily.    [name] losartan (COZAAR) 100 MG tablet TAKE 1 TABLET BY MOUTH EVERY DAY    . GLUCOPHAGE-XR) 500 MG tablet TAKE 2 TABLETS(1000 MG)PO DAILY WITH BREAKFAST    . PRILOSEC 20 MG capsule TAKE 1 CAPSULE BY MOUTH EVERY MORNING    . ONE TOUCH ULTRA TEST test strip USE AS DIRECTED THREE TIMES DAILY     PERCOCET Take 1 tablet by mouth  every 8 (eight) hours PRN for severe pain.    .      . simvastatin (ZOCOR) 10 MG tablet TAKE 1 TABLET BY MOUTH EVERY MORNING       Review of Systems PER HPI OTHERWISE ALL SYSTEMS ARE NEGATIVE.     Objective:   Physical Exam  Constitutional: She is oriented to person, place, and time. She appears well-developed and well-nourished. No distress.  HENT:  Head: Normocephalic and atraumatic.  Mouth/Throat: Oropharynx is clear and moist. No oropharyngeal exudate.  Eyes: Pupils are equal, round, and reactive to light. No scleral icterus.  Neck: Normal range of motion. Neck supple.  Cardiovascular: Normal rate, regular rhythm and normal heart sounds.     Pulmonary/Chest: Effort normal and breath sounds normal. No respiratory distress.  Abdominal: Soft. Bowel sounds are normal. She exhibits no distension. There is tenderness. There is no rebound and no guarding.  MOD TTP IN LUQ, RUQ, RLQ,  LLQ,  & IN THE EPIGASTRIUM   Musculoskeletal: She exhibits no edema.  Lymphadenopathy:    She has no cervical adenopathy.  Neurological: She is alert and oriented to person, place, and time.  NO FOCAL DEFICITS  Psychiatric: She has a normal mood and affect.  Vitals reviewed.     Assessment & Plan:

## 2015-09-02 NOTE — Progress Notes (Signed)
ON RECALL  °

## 2015-09-02 NOTE — Patient Instructions (Addendum)
SUBMIT A STOOL SAMPLE.  DRINK WATER TO KEEP YOUR URINE LIGHT YELLOW.  FOLLOW A LOW FAT DIET. SEE INFO BELOW.  TRY VIBERZI TO REDUCE PAIN AND CONTROL DIARRHE. TAKE ONE TABLET  DAILY for 7 days then increase to twice daily. USE IMODIUM 1 OR 2 TIMES A DAY IF NEEDED.  YOU MUST CALL IF YOU GO MORE THAN 4 DAYS WITHOUT A BOWEL MOVEMENT OR YOU DEVELOP INCREASE IN NAUSEA, VOMITING, OR ABDOMINAL PAIN.  ADD PAMELOR 1 AT BEDTIME FOR 7 DAYS THEN 2 AT BEDTIME TO REDUCE PAIN AND CONTROL DIARRHEA. IT MAY CAUSE DROWSINESS, DRY EYES/MOUTH, BLURRY VISION, OR DIFFICULTY URINATING.  PLEASE CALL IN 14 DAYS IF SYMPTOMS ARE NOT IMPROVED.   FOLLOW UP IN 4 MOS.    Low-Fat Diet BREADS, CEREALS, PASTA, RICE, DRIED PEAS, AND BEANS These products are high in carbohydrates and most are low in fat. Therefore, they can be increased in the diet as substitutes for fatty foods. They too, however, contain calories and should not be eaten in excess. Cereals can be eaten for snacks as well as for breakfast.   FRUITS AND VEGETABLES It is good to eat fruits and vegetables. Besides being sources of fiber, both are rich in vitamins and some minerals. They help you get the daily allowances of these nutrients. Fruits and vegetables can be used for snacks and desserts.  MEATS Limit lean meat, chicken, [place], and fish to no more than 6 ounces per day. Beef, Pork, and Lamb Use lean cuts of beef, pork, and lamb. Lean cuts include:  Extra-lean ground beef.  Arm roast.  Sirloin tip.  Center-cut ham.  Round steak.  Loin chops.  Rump roast.  Tenderloin.  Trim all fat off the outside of meats before cooking. It is not necessary to severely decrease the intake of red meat, but lean choices should be made. Lean meat is rich in protein and contains a highly absorbable form of iron. Premenopausal women, in particular, should avoid reducing lean red meat because this could increase the risk for low red blood cells (iron-deficiency  anemia).  Chicken and [place] These are good sources of protein. The fat of poultry can be reduced by removing the skin and underlying fat layers before cooking. Chicken and [place] can be substituted for lean red meat in the diet. Poultry should not be fried or covered with high-fat sauces. Fish and Shellfish Fish is a good source of protein. Shellfish contain cholesterol, but they usually are low in saturated fatty acids. The preparation of fish is important. Like chicken and [place], they should not be fried or covered with high-fat sauces. EGGS Egg whites contain no fat or cholesterol. They can be eaten often. Try 1 to 2 egg whites instead of whole eggs in recipes or use egg substitutes that do not contain yolk. MILK AND DAIRY PRODUCTS Use skim or 1% milk instead of 2% or whole milk. Decrease whole milk, natural, and processed cheeses. Use nonfat or low-fat (2%) cottage cheese or low-fat cheeses made from vegetable oils. Choose nonfat or low-fat (1 to 2%) yogurt. Experiment with evaporated skim milk in recipes that call for heavy cream. Substitute low-fat yogurt or low-fat cottage cheese for sour cream in dips and salad dressings. Have at least 2 servings of low-fat dairy products, such as 2 glasses of skim (or 1%) milk each day to help get your daily calcium intake. FATS AND OILS Reduce the total intake of fats, especially saturated fat. Butterfat, lard, and beef fats are high  in saturated fat and cholesterol. These should be avoided as much as possible. Vegetable fats do not contain cholesterol, but certain vegetable fats, such as coconut oil, palm oil, and palm kernel oil are very high in saturated fats. These should be limited. These fats are often used in bakery goods, processed foods, popcorn, oils, and nondairy creamers. Vegetable shortenings and some peanut butters contain hydrogenated oils, which are also saturated fats. Read the labels on these foods and check for saturated vegetable  oils. Unsaturated vegetable oils and fats do not raise blood cholesterol. However, they should be limited because they are fats and are high in calories. Total fat should still be limited to 30% of your daily caloric intake. Desirable liquid vegetable oils are corn oil, cottonseed oil, olive oil, canola oil, safflower oil, soybean oil, and sunflower oil. Peanut oil is not as good, but small amounts are acceptable. Buy a heart-healthy tub margarine that has no partially hydrogenated oils in the ingredients. Mayonnaise and salad dressings often are made from unsaturated fats, but they should also be limited because of their high calorie and fat content. Seeds, nuts, peanut butter, olives, and avocados are high in fat, but the fat is mainly the unsaturated type. These foods should be limited mainly to avoid excess calories and fat. OTHER EATING TIPS Snacks  Most sweets should be limited as snacks. They tend to be rich in calories and fats, and their caloric content outweighs their nutritional value. Some good choices in snacks are graham crackers, melba toast, soda crackers, bagels (no egg), English muffins, fruits, and vegetables. These snacks are preferable to snack crackers, [place] fries, TORTILLA CHIPS, and POTATO chips. Popcorn should be air-popped or cooked in small amounts of liquid vegetable oil. Desserts Eat fruit, low-fat yogurt, and fruit ices instead of pastries, cake, and cookies. Sherbet, angel food cake, gelatin dessert, frozen low-fat yogurt, or other frozen products that do not contain saturated fat (pure fruit juice bars, frozen ice pops) are also acceptable.  COOKING METHODS Choose those methods that use little or no fat. They include: Poaching.  Braising.  Steaming.  Grilling.  Baking.  Stir-frying.  Broiling.  Microwaving.  Foods can be cooked in a nonstick pan without added fat, or use a nonfat cooking spray in regular cookware. Limit fried foods and avoid frying in saturated  fat. Add moisture to lean meats by using water, broth, cooking wines, and other nonfat or low-fat sauces along with the cooking methods mentioned above. Soups and stews should be chilled after cooking. The fat that forms on top after a few hours in the refrigerator should be skimmed off. When preparing meals, avoid using excess salt. Salt can contribute to raising blood pressure in some people.  EATING AWAY FROM HOME Order entres, potatoes, and vegetables without sauces or butter. When meat exceeds the size of a deck of cards (3 to 4 ounces), the rest can be taken home for another meal. Choose vegetable or fruit salads and ask for low-calorie salad dressings to be served on the side. Use dressings sparingly. Limit high-fat toppings, such as bacon, crumbled eggs, cheese, sunflower seeds, and olives. Ask for heart-healthy tub margarine instead of butter.

## 2015-09-02 NOTE — Telephone Encounter (Signed)
pt called to inform us that her insurance will not cover the Viberzi she will have to have a PA done.

## 2015-09-02 NOTE — Progress Notes (Signed)
Cc'ed to pcp °

## 2015-09-05 DIAGNOSIS — R404 Transient alteration of awareness: Secondary | ICD-10-CM | POA: Diagnosis not present

## 2015-09-05 DIAGNOSIS — R531 Weakness: Secondary | ICD-10-CM | POA: Diagnosis not present

## 2015-09-05 DIAGNOSIS — T1490 Injury, unspecified: Secondary | ICD-10-CM | POA: Diagnosis not present

## 2015-09-06 ENCOUNTER — Telehealth: Payer: Self-pay | Admitting: Gastroenterology

## 2015-09-06 NOTE — Telephone Encounter (Signed)
PT is aware.

## 2015-09-06 NOTE — Telephone Encounter (Signed)
PLEASE CALL PATIENT [phone]     SHE HAS TAKEN VIBERZI AND CAN NOT GO TO THE BATHROOM.  SHE IS SUPPOSED TO DO A STOOL SAMPLE AND CAN NOT GO   CELL NUMBER [phone]

## 2015-09-06 NOTE — Telephone Encounter (Signed)
I spoke to pt and she started taking the Viberzi the day she came in the office and has taken 4. However, she has not had a BM since the day before she came in the office. She said she was supposed to turn in a stool sample and has not had a BM to do the stool sample. Please advise!

## 2015-09-06 NOTE — Telephone Encounter (Signed)
PLEASE CALL PT. HOLD VIBERZI FOR 3 DAYS THEN START TAKING EVERY OTHER DAY.

## 2015-09-07 ENCOUNTER — Telehealth: Payer: Self-pay | Admitting: Family Medicine

## 2015-09-07 MED ORDER — OXYCODONE-ACETAMINOPHEN 7.5-325 MG PO TABS: 1.0000 | ORAL_TABLET | Freq: Three times a day (TID) | ORAL | Status: DC | PRN

## 2015-09-07 NOTE — Telephone Encounter (Signed)
Ok to refill??  Last office visit 06/09/2015.  Last refill 07/30/2015.

## 2015-09-07 NOTE — Telephone Encounter (Signed)
Pt requests a refill of Percocet [phone]

## 2015-09-07 NOTE — Telephone Encounter (Signed)
Prescription printed and patient made aware to come to office to pick up on 09/08/2015 per VM.

## 2015-09-07 NOTE — Telephone Encounter (Signed)
okay

## 2015-09-08 ENCOUNTER — Encounter: Payer: Self-pay | Admitting: Family Medicine

## 2015-09-08 ENCOUNTER — Ambulatory Visit (INDEPENDENT_AMBULATORY_CARE_PROVIDER_SITE_OTHER): Payer: Medicare HMO | Admitting: Family Medicine

## 2015-09-08 VITALS — BP 108/60 | HR 80 | Temp 98.7°F | Resp 18 | Wt 204.0 lb

## 2015-09-08 DIAGNOSIS — R1030 Lower abdominal pain, unspecified: Secondary | ICD-10-CM | POA: Diagnosis not present

## 2015-09-08 DIAGNOSIS — R5383 Other fatigue: Secondary | ICD-10-CM | POA: Diagnosis not present

## 2015-09-08 DIAGNOSIS — E1140 Type 2 diabetes mellitus with diabetic neuropathy, unspecified: Secondary | ICD-10-CM | POA: Diagnosis not present

## 2015-09-08 DIAGNOSIS — N3001 Acute cystitis with hematuria: Secondary | ICD-10-CM

## 2015-09-08 DIAGNOSIS — K589 Irritable bowel syndrome without diarrhea: Secondary | ICD-10-CM

## 2015-09-08 DIAGNOSIS — R109 Unspecified abdominal pain: Secondary | ICD-10-CM | POA: Diagnosis not present

## 2015-09-08 LAB — URINALYSIS, ROUTINE W REFLEX MICROSCOPIC
BILIRUBIN URINE: NEGATIVE
Glucose, UA: NEGATIVE
HGB URINE DIPSTICK: NEGATIVE
KETONES UR: NEGATIVE
Leukocytes, UA: NEGATIVE
NITRITE: NEGATIVE
PH: 5.5 (ref 5.0–8.0)
SPECIFIC GRAVITY, URINE: 1.015 (ref 1.001–1.035)

## 2015-09-08 LAB — CBC W/MCH & 3 PART DIFF
HCT: 31 % — ABNORMAL LOW (ref 36.0–46.0)
Hemoglobin: 11 g/dL — ABNORMAL LOW (ref 12.0–15.0)
Lymphocytes Relative: 15 % (ref 12–46)
Lymphs Abs: 1.7 K/uL (ref 0.7–4.0)
MCH: 30.5 pg (ref 26.0–34.0)
MCHC: 35.5 g/dL (ref 30.0–36.0)
MCV: 85.9 fL (ref 78.0–100.0)
Neutro Abs: 9 K/uL — ABNORMAL HIGH (ref 1.7–7.7)
Neutrophils Relative %: 80 % — ABNORMAL HIGH (ref 43–77)
Platelets: 381 K/uL (ref 150–400)
RBC: 3.61 MIL/uL — ABNORMAL LOW (ref 3.87–5.11)
RDW: 14 % (ref 11.5–15.5)
WBC mixed population %: 5 % (ref 3–18)
WBC mixed population: 0.6 K/uL (ref 0.1–1.8)
WBC: 11.2 K/uL — ABNORMAL HIGH (ref 4.0–10.5)

## 2015-09-08 LAB — HEMOGLOBIN A1C, FINGERSTICK: Hgb A1C (fingerstick): 7.1 % — ABNORMAL HIGH

## 2015-09-08 MED ORDER — CIPROFLOXACIN HCL 500 MG PO TABS: 500.0000 mg | ORAL_TABLET | Freq: Two times a day (BID) | ORAL | Status: DC

## 2015-09-08 NOTE — Progress Notes (Signed)
Patient ID: Stephanie Hammond, female   DOB: 1950/08/09, 65 y.o.   MRN: [id number]   Subjective:    Patient ID: Stephanie Hammond, female    DOB: 1951-07-17, 65 y.o.   MRN: [id number]  Patient presents for OTHER  patient here with fatigue for the past few days. She is asked to been having significant diarrhea she was seen by her gastroenterologist who diagnosed her with irritable bowel syndrome diarrhea type. She was given Viberzi  At this cause constipation. She then took Pamelor however she had some hallucinations with this therefore discontinued. She is now back on the Viberzi  Every other day. She still has some cramping and discomfort in the lower abdomen. She denies any fever. Has been trying to drink more fluids. She states her blood sugars have been very labile today was 117 yesterday morning it was 333. She is on metformin her last A1c 3 months ago was 5.7.  She has lost about 12 pounds since her last visit because of the diarrhea and GI upset.  she also admits to urinary frequency and pressure when she urinates for the past week.   Review Of Systems:  GEN- + fatigue,denies  fever, weight loss,weakness, recent illness HEENT- denies eye drainage, change in vision, nasal discharge, CVS- denies chest pain, palpitations RESP- denies SOB, cough, wheeze ABD- denies N/V, +change in stools, +abd pain GU- denies dysuria, hematuria, dribbling, incontinence MSK- denies joint pain, muscle aches, injury Neuro- denies headache, dizziness, syncope, seizure activity       Objective:    BP 108/60 mmHg  Pulse 80  Temp(Src) 98.7 F (37.1 C) (Oral)  Resp 18  Wt 204 lb (92.534 kg) GEN- NAD, alert and oriented x3 HEENT- PERRL, EOMI, non injected sclera, pink conjunctiva, MM a little dry, oropharynx clear Neck- Supple,no LAD, no thyromegaly  CVS- RRR, no murmur RESP-CTAB ABD-NABS,soft,ND, Mild TTP lower quadrants and suprapubic region, no CVA tenderness EXT- No edema Pulses- Radial   2+        Assessment & Plan:      Problem List Items Addressed This Visit    IBS (irritable bowel syndrome)     She will complete the medications as described by gastroenterology and follow-up with her office      Diabetes with neurologic complications (HCC)     A1c is at 7. 1% which actually looks okay she will continue the metformin 1000 mg daily this does not seem to be affected by the diarrhea      Relevant Orders   Hemoglobin A1C, fingerstick (Completed)   Comprehensive metabolic panel    Other Visit Diagnoses    Other fatigue    -  Primary     I think this is multifactorial but she also has component of dehydration with her possible cystitis and the diarrhea    Relevant Orders    CBC w/[facility] & 3 Part Diff (Completed)    Lower abdominal pain        Relevant Orders    Urinalysis, Routine w reflex microscopic (not at [facility]) (Completed)    Comprehensive metabolic panel    Acute cystitis with hematuria         urine sample was very scant we were unable to do a culture  or microscopy. She does have blood in the urine but this is not the best sample. Based on her symptoms  And mild elevation in her white blood cell count. Her metabolic panel is pending today.  Note: This dictation was prepared with Dragon dictation along with smaller phrase technology. Any transcriptional errors that result from this process are unintentional.

## 2015-09-08 NOTE — Assessment & Plan Note (Signed)
A1c is at 7. 1% which actually looks okay she will continue the metformin 1000 mg daily this does not seem to be affected by the diarrhea

## 2015-09-08 NOTE — Assessment & Plan Note (Signed)
She will complete the medications as described by gastroenterology and follow-up with her office

## 2015-09-08 NOTE — Patient Instructions (Signed)
Take antibiotics as prescribed Increase water, gaterade  Pain medication No Pamelor  F/U 3 months

## 2015-09-09 LAB — COMPREHENSIVE METABOLIC PANEL
ALK PHOS: 189 U/L — AB (ref 33–130)
ALT: 42 U/L — AB (ref 6–29)
AST: 24 U/L (ref 10–35)
Albumin: 3.2 g/dL — ABNORMAL LOW (ref 3.6–5.1)
BUN: 30 mg/dL — AB (ref 7–25)
CALCIUM: 8.8 mg/dL (ref 8.6–10.4)
CHLORIDE: 101 mmol/L (ref 98–110)
CO2: 20 mmol/L (ref 20–31)
Creat: 0.98 mg/dL (ref 0.50–0.99)
GLUCOSE: 223 mg/dL — AB (ref 70–99)
POTASSIUM: 4.8 mmol/L (ref 3.5–5.3)
Sodium: 135 mmol/L (ref 135–146)
Total Bilirubin: 0.6 mg/dL (ref 0.2–1.2)
Total Protein: 6.4 g/dL (ref 6.1–8.1)

## 2015-09-10 ENCOUNTER — Ambulatory Visit: Payer: Medicare HMO | Admitting: Family Medicine

## 2015-09-20 ENCOUNTER — Telehealth: Payer: Self-pay | Admitting: Gastroenterology

## 2015-09-20 NOTE — Telephone Encounter (Signed)
REVIEWED. Pt should not take Viberzi until her C Diff test is negative.

## 2015-09-20 NOTE — Telephone Encounter (Signed)
PATIENT STILL HAVING DIARRHEA AND IS OUT OF SAMPLES, MEDICINE IS VERY EXPENSIVE AND HAS NOT GOTTEN IT FILLED.  PLEASE CALL [phone]

## 2015-09-20 NOTE — Telephone Encounter (Signed)
Pt is still having diarrhea, had 4 episodes today. But then she took a Viberizi and hasn't had any more. She requested some more samples of the Viberzi 75 mg and I have 3 boxes for her to pick up tomorrow morning. She never did the stool sample, waited too long and I am leaving container and order for her tomorrow.

## 2015-09-21 NOTE — Telephone Encounter (Signed)
Pt is aware.  

## 2015-09-26 ENCOUNTER — Observation Stay (HOSPITAL_COMMUNITY)
Admission: EM | Admit: 2015-09-26 | Discharge: 2015-09-27 | Disposition: A | Discharge status: Home / Self Care | Payer: Medicare HMO | Attending: Internal Medicine | Admitting: Internal Medicine

## 2015-09-26 ENCOUNTER — Encounter (HOSPITAL_COMMUNITY): Payer: Self-pay | Admitting: Emergency Medicine

## 2015-09-26 ENCOUNTER — Emergency Department (HOSPITAL_COMMUNITY): Payer: Medicare HMO

## 2015-09-26 DIAGNOSIS — F17210 Nicotine dependence, cigarettes, uncomplicated: Secondary | ICD-10-CM | POA: Insufficient documentation

## 2015-09-26 DIAGNOSIS — I2510 Atherosclerotic heart disease of native coronary artery without angina pectoris: Secondary | ICD-10-CM | POA: Insufficient documentation

## 2015-09-26 DIAGNOSIS — R7309 Other abnormal glucose: Secondary | ICD-10-CM | POA: Diagnosis not present

## 2015-09-26 DIAGNOSIS — I10 Essential (primary) hypertension: Secondary | ICD-10-CM | POA: Insufficient documentation

## 2015-09-26 DIAGNOSIS — Z79899 Other long term (current) drug therapy: Secondary | ICD-10-CM | POA: Diagnosis not present

## 2015-09-26 DIAGNOSIS — E669 Obesity, unspecified: Secondary | ICD-10-CM | POA: Diagnosis present

## 2015-09-26 DIAGNOSIS — R000 Tachycardia, unspecified: Secondary | ICD-10-CM | POA: Insufficient documentation

## 2015-09-26 DIAGNOSIS — E1165 Type 2 diabetes mellitus with hyperglycemia: Secondary | ICD-10-CM | POA: Diagnosis not present

## 2015-09-26 DIAGNOSIS — M109 Gout, unspecified: Secondary | ICD-10-CM | POA: Diagnosis not present

## 2015-09-26 DIAGNOSIS — R451 Restlessness and agitation: Secondary | ICD-10-CM | POA: Insufficient documentation

## 2015-09-26 DIAGNOSIS — D649 Anemia, unspecified: Secondary | ICD-10-CM | POA: Insufficient documentation

## 2015-09-26 DIAGNOSIS — E1149 Type 2 diabetes mellitus with other diabetic neurological complication: Secondary | ICD-10-CM | POA: Diagnosis present

## 2015-09-26 DIAGNOSIS — R509 Fever, unspecified: Secondary | ICD-10-CM | POA: Diagnosis not present

## 2015-09-26 DIAGNOSIS — M1990 Unspecified osteoarthritis, unspecified site: Secondary | ICD-10-CM | POA: Diagnosis not present

## 2015-09-26 DIAGNOSIS — E1140 Type 2 diabetes mellitus with diabetic neuropathy, unspecified: Secondary | ICD-10-CM | POA: Diagnosis present

## 2015-09-26 DIAGNOSIS — R69 Illness, unspecified: Secondary | ICD-10-CM | POA: Diagnosis not present

## 2015-09-26 DIAGNOSIS — A419 Sepsis, unspecified organism: Secondary | ICD-10-CM | POA: Diagnosis not present

## 2015-09-26 DIAGNOSIS — R5383 Other fatigue: Secondary | ICD-10-CM | POA: Diagnosis not present

## 2015-09-26 DIAGNOSIS — K219 Gastro-esophageal reflux disease without esophagitis: Secondary | ICD-10-CM | POA: Diagnosis not present

## 2015-09-26 DIAGNOSIS — K589 Irritable bowel syndrome without diarrhea: Secondary | ICD-10-CM

## 2015-09-26 DIAGNOSIS — Z720 Tobacco use: Secondary | ICD-10-CM | POA: Diagnosis present

## 2015-09-26 DIAGNOSIS — Z880 Allergy status to penicillin: Secondary | ICD-10-CM | POA: Insufficient documentation

## 2015-09-26 DIAGNOSIS — R739 Hyperglycemia, unspecified: Secondary | ICD-10-CM | POA: Diagnosis not present

## 2015-09-26 DIAGNOSIS — F17200 Nicotine dependence, unspecified, uncomplicated: Secondary | ICD-10-CM | POA: Diagnosis present

## 2015-09-26 LAB — CBC WITH DIFFERENTIAL/PLATELET
BASOS ABS: 0 10*3/uL (ref 0.0–0.1)
Basophils Relative: 0 %
EOS ABS: 0.1 10*3/uL (ref 0.0–0.7)
Eosinophils Relative: 1 %
HEMATOCRIT: 31.9 % — AB (ref 36.0–46.0)
HEMOGLOBIN: 11.2 g/dL — AB (ref 12.0–15.0)
LYMPHS ABS: 0.3 10*3/uL — AB (ref 0.7–4.0)
Lymphocytes Relative: 4 %
MCH: 29.6 pg (ref 26.0–34.0)
MCHC: 35.1 g/dL (ref 30.0–36.0)
MCV: 84.2 fL (ref 78.0–100.0)
MONOS PCT: 1 %
Monocytes Absolute: 0.1 10*3/uL (ref 0.1–1.0)
NEUTROS ABS: 8.1 10*3/uL — AB (ref 1.7–7.7)
Neutrophils Relative %: 94 %
Platelets: 280 10*3/uL (ref 150–400)
RBC: 3.79 MIL/uL — ABNORMAL LOW (ref 3.87–5.11)
RDW: 13.3 % (ref 11.5–15.5)
WBC: 8.6 10*3/uL (ref 4.0–10.5)

## 2015-09-26 LAB — URINALYSIS, ROUTINE W REFLEX MICROSCOPIC
BILIRUBIN URINE: NEGATIVE
Glucose, UA: >1000 mg/dL — AB
HGB URINE DIPSTICK: NEGATIVE
KETONES UR: NEGATIVE mg/dL
Leukocytes, UA: NEGATIVE
NITRITE: NEGATIVE
Protein, ur: NEGATIVE mg/dL
Specific Gravity, Urine: 1.015 (ref 1.005–1.030)
pH: 5.5 (ref 5.0–8.0)

## 2015-09-26 LAB — URINE MICROSCOPIC-ADD ON
Bacteria, UA: NONE SEEN
RBC / HPF: NONE SEEN RBC/hpf (ref 0–5)
WBC, UA: NONE SEEN WBC/hpf (ref 0–5)

## 2015-09-26 LAB — I-STAT CG4 LACTIC ACID, ED
LACTIC ACID, VENOUS: 2.34 mmol/L — AB (ref 0.5–2.0)
LACTIC ACID, VENOUS: 1.23 mmol/L (ref 0.5–2.0)

## 2015-09-26 LAB — COMPREHENSIVE METABOLIC PANEL
ALT: 24 U/L (ref 14–54)
AST: 20 U/L (ref 15–41)
Albumin: 3.4 g/dL — ABNORMAL LOW (ref 3.5–5.0)
Alkaline Phosphatase: 207 U/L — ABNORMAL HIGH (ref 38–126)
Anion gap: 12 (ref 5–15)
BILIRUBIN TOTAL: 0.7 mg/dL (ref 0.3–1.2)
BUN: 14 mg/dL (ref 6–20)
CALCIUM: 9.1 mg/dL (ref 8.9–10.3)
CO2: 24 mmol/L (ref 22–32)
CREATININE: 0.87 mg/dL (ref 0.44–1.00)
Chloride: 101 mmol/L (ref 101–111)
Glucose, Bld: 388 mg/dL — ABNORMAL HIGH (ref 65–99)
Potassium: 3.9 mmol/L (ref 3.5–5.1)
Sodium: 137 mmol/L (ref 135–145)
Total Protein: 7.3 g/dL (ref 6.5–8.1)

## 2015-09-26 LAB — INFLUENZA PANEL BY PCR (TYPE A & B)
H1N1 flu by pcr: NOT DETECTED
INFLAPCR: NEGATIVE
INFLBPCR: NEGATIVE

## 2015-09-26 LAB — GLUCOSE, CAPILLARY
GLUCOSE-CAPILLARY: 371 mg/dL — AB (ref 65–99)
GLUCOSE-CAPILLARY: 357 mg/dL — AB (ref 65–99)

## 2015-09-26 MED ORDER — SIMVASTATIN 10 MG PO TABS
10.0000 mg | ORAL_TABLET | Freq: Every day | ORAL | Status: DC
  Administered 2015-09-26: 10 mg via ORAL
  Filled 2015-09-26: qty 1

## 2015-09-26 MED ORDER — ACETAMINOPHEN 325 MG PO TABS
650.0000 mg | ORAL_TABLET | Freq: Four times a day (QID) | ORAL | Status: DC | PRN
Start: 2015-09-26 — End: 2015-09-27

## 2015-09-26 MED ORDER — LEVOFLOXACIN IN D5W 750 MG/150ML IV SOLN
750.0000 mg | Freq: Once | INTRAVENOUS | Status: AC
  Administered 2015-09-26: 750 mg via INTRAVENOUS
  Filled 2015-09-26: qty 150

## 2015-09-26 MED ORDER — INSULIN ASPART 100 UNIT/ML ~~LOC~~ SOLN
0.0000 [IU] | Freq: Three times a day (TID) | SUBCUTANEOUS | Status: DC
  Administered 2015-09-26: 15 [IU] via SUBCUTANEOUS
  Administered 2015-09-27: 8 [IU] via SUBCUTANEOUS
  Administered 2015-09-27: 15 [IU] via SUBCUTANEOUS

## 2015-09-26 MED ORDER — SODIUM CHLORIDE 0.9% FLUSH: 3.0000 mL | Freq: Two times a day (BID) | INTRAVENOUS | Status: DC

## 2015-09-26 MED ORDER — SODIUM CHLORIDE 0.9 % IV BOLUS (SEPSIS)
1000.0000 mL | Freq: Once | INTRAVENOUS | Status: AC
Start: 2015-09-26 — End: 2015-09-26
  Administered 2015-09-26: 1000 mL via INTRAVENOUS

## 2015-09-26 MED ORDER — ACETAMINOPHEN 650 MG RE SUPP: 650.0000 mg | Freq: Four times a day (QID) | RECTAL | Status: DC | PRN

## 2015-09-26 MED ORDER — OSELTAMIVIR PHOSPHATE 75 MG PO CAPS
75.0000 mg | ORAL_CAPSULE | Freq: Two times a day (BID) | ORAL | Status: DC
  Administered 2015-09-26 – 2015-09-27 (×2): 75 mg via ORAL
  Filled 2015-09-26 (×2): qty 1

## 2015-09-26 MED ORDER — SIMVASTATIN 10 MG PO TABS: 10.0000 mg | ORAL_TABLET | Freq: Every morning | ORAL | Status: DC

## 2015-09-26 MED ORDER — VANCOMYCIN HCL IN DEXTROSE 1-5 GM/200ML-% IV SOLN
1000.0000 mg | Freq: Once | INTRAVENOUS | Status: AC
  Administered 2015-09-26: 1000 mg via INTRAVENOUS
  Filled 2015-09-26: qty 200

## 2015-09-26 MED ORDER — PANTOPRAZOLE SODIUM 40 MG PO TBEC
40.0000 mg | DELAYED_RELEASE_TABLET | Freq: Every day | ORAL | Status: DC
Start: 2015-09-26 — End: 2015-09-27
  Administered 2015-09-26 – 2015-09-27 (×2): 40 mg via ORAL
  Filled 2015-09-26 (×2): qty 1

## 2015-09-26 MED ORDER — ONDANSETRON HCL 4 MG PO TABS: 4.0000 mg | ORAL_TABLET | Freq: Four times a day (QID) | ORAL | Status: DC | PRN

## 2015-09-26 MED ORDER — INSULIN ASPART 100 UNIT/ML ~~LOC~~ SOLN
0.0000 [IU] | Freq: Every day | SUBCUTANEOUS | Status: DC
  Administered 2015-09-26: 2 [IU] via SUBCUTANEOUS

## 2015-09-26 MED ORDER — OSELTAMIVIR PHOSPHATE 75 MG PO CAPS
75.0000 mg | ORAL_CAPSULE | Freq: Once | ORAL | Status: AC
  Administered 2015-09-26: 75 mg via ORAL
  Filled 2015-09-26: qty 1

## 2015-09-26 MED ORDER — SODIUM CHLORIDE 0.9 % IV SOLN
INTRAVENOUS | Status: DC
Start: 2015-09-26 — End: 2015-09-27
  Administered 2015-09-26: 17:00:00 via INTRAVENOUS

## 2015-09-26 MED ORDER — ALLOPURINOL 300 MG PO TABS
300.0000 mg | ORAL_TABLET | Freq: Every day | ORAL | Status: DC
  Administered 2015-09-26 – 2015-09-27 (×2): 300 mg via ORAL
  Filled 2015-09-26 (×4): qty 1

## 2015-09-26 MED ORDER — LEVOFLOXACIN IN D5W 750 MG/150ML IV SOLN
750.0000 mg | INTRAVENOUS | Status: DC
  Administered 2015-09-27: 750 mg via INTRAVENOUS
  Filled 2015-09-26: qty 150

## 2015-09-26 MED ORDER — SODIUM CHLORIDE 0.9 % IV BOLUS (SEPSIS)
1000.0000 mL | Freq: Once | INTRAVENOUS | Status: AC
  Administered 2015-09-26: 1000 mL via INTRAVENOUS

## 2015-09-26 MED ORDER — ONDANSETRON HCL 4 MG/2ML IJ SOLN: 4.0000 mg | Freq: Four times a day (QID) | INTRAMUSCULAR | Status: DC | PRN

## 2015-09-26 MED ORDER — VANCOMYCIN HCL 10 G IV SOLR
1250.0000 mg | Freq: Two times a day (BID) | INTRAVENOUS | Status: DC
  Administered 2015-09-26 – 2015-09-27 (×2): 1250 mg via INTRAVENOUS
  Filled 2015-09-26 (×4): qty 1250

## 2015-09-26 MED ORDER — ENOXAPARIN SODIUM 40 MG/0.4ML ~~LOC~~ SOLN: 40.0000 mg | SUBCUTANEOUS | Status: DC

## 2015-09-26 MED ORDER — LEVOFLOXACIN IN D5W 750 MG/150ML IV SOLN: 750.0000 mg | INTRAVENOUS | Status: DC

## 2015-09-26 MED ORDER — ACETAMINOPHEN 325 MG PO TABS
650.0000 mg | ORAL_TABLET | Freq: Once | ORAL | Status: AC | PRN
  Administered 2015-09-26: 650 mg via ORAL

## 2015-09-26 MED ORDER — ENOXAPARIN SODIUM 40 MG/0.4ML ~~LOC~~ SOLN
40.0000 mg | SUBCUTANEOUS | Status: DC
  Administered 2015-09-26: 40 mg via SUBCUTANEOUS
  Filled 2015-09-26: qty 0.4

## 2015-09-26 NOTE — ED Notes (Signed)
Attempted report x1. 

## 2015-09-26 NOTE — H&P (Signed)
[facility] History and Physical  AIVREE BOYD DUK:[id number] DOB: 04/24/1951    PCP:   Milinda Antis, MD   Chief Complaint:  Lightheadedness.  HPI: Stephanie Hammond is an 65 y.o. female with hx of DM on Metformin, GERD, IBS, HTN, CAD, lives with her husband at home, brought to the ER feeling malaise, chills, some myalgia, and lightheadedness, but no CP, coughs, HA, nausea, vomiting or diarrhea.  She has no distant travel or ill contacts.  Evaluation in the ER showed clear CXR and negative UA.  Influenza test is pending.  Her BP was soft, and her work up included normal renal fx test, no leukocytosis, and BS of 388.  She was given IV Zenaida Niece and IV Levoquin, and hospitalist was asked to admit her for possible sepsis, suspicious for a viral infection, volume depletion and hyperglycemia.   Rewiew of Systems:  Constitutional: Negative for malaise, fever and chills. No significant weight loss or weight gain Eyes: Negative for eye pain, redness and discharge, diplopia, visual changes, or flashes of light. ENMT: Negative for ear pain, hoarseness, nasal congestion, sinus pressure and sore throat. No headaches; tinnitus, drooling, or problem swallowing. Cardiovascular: Negative for chest pain, palpitations, diaphoresis, dyspnea and peripheral edema. ; No orthopnea, PND Respiratory: Negative for cough, hemoptysis, wheezing and stridor. No pleuritic chestpain. Gastrointestinal: Negative for nausea, vomiting, diarrhea, constipation, abdominal pain, melena, blood in stool, hematemesis, jaundice and rectal bleeding.    Genitourinary: Negative for frequency, dysuria, incontinence,flank pain and hematuria; Musculoskeletal: Negative for back pain and neck pain. Negative for swelling and trauma.;  Skin: . Negative for pruritus, rash, abrasions, bruising and skin lesion.; ulcerations Neuro: Negative for headache,  and neck stiffness. Negative for weakness, altered level of consciousness , altered  mental status, extremity weakness, burning feet, involuntary movement, seizure and syncope.  Psych: negative for anxiety, depression, insomnia, tearfulness, panic attacks, hallucinations, paranoia, suicidal or homicidal ideation    Past Medical History  Diagnosis Date  . IBS (irritable bowel syndrome)   . GERD (gastroesophageal reflux disease)   . Anemia   . History of GI diverticular bleed 08/2004  . Diverticulitis   . Diverticulosis   . Osteoarthritis   . Diabetes mellitus   . Blood transfusion 2009    after GI bleed  . Gout   . CAD (coronary artery disease)   . Hypertension     Past Surgical History  Procedure Laterality Date  . Ptca    . Vaginal hysterectomy    . Cholecystectomy  2003  . Replacement total knee      right   . Colonoscopy  2000    Dr. Russella Dar: marked diverticulosis, difficult procedure due to adhesions, polyps benign  . Colonoscopy  2006    Dr. Marina Goodell: severe pandiverticulosis  . Colonoscopy N/A 02/03/2013    SLF: 4 COLORECTAL POLYPS REMOVED/Moderate diverticulosis throughout the entire examined colon/Small internal hemorrhoids  . Joint replacement Right 2009    Knee Replacement Right   . Total knee arthroplasty Left 03/12/2015    Procedure: LEFT TOTAL KNEE REPLACEMENT;  Surgeon: Vickki Hearing, MD;  Location: [facility];  Service: Orthopedics;  Laterality: Left;    Medications:  HOME MEDS: Prior to Admission medications   Medication Sig Start Date End Date Taking? Authorizing Provider  allopurinol (ZYLOPRIM) 300 MG tablet TAKE 1 TABLET BY MOUTH EVERY DAY 07/13/15  Yes Salley Scarlet, MD  amLODipine (NORVASC) 10 MG tablet TAKE 1 TABLET BY MOUTH EVERY DAY 01/19/15  Yes Velna Hatchet  [place], MD  Eluxadoline (VIBERZI) 75 MG TABS Take 75 mg by mouth 2 (two) times daily. 09/02/15  Yes [place] Bali, MD  losartan (COZAAR) 100 MG tablet TAKE 1 TABLET BY MOUTH EVERY DAY 08/10/15  Yes Salley Scarlet, MD  metFORMIN (GLUCOPHAGE-XR) 500 MG 24 hr tablet TAKE 2 TABLETS(1000  MG) BY MOUTH DAILY WITH BREAKFAST 03/19/15  Yes Salley Scarlet, MD  omeprazole (PRILOSEC) 20 MG capsule TAKE 1 CAPSULE BY MOUTH EVERY MORNING 04/28/15  Yes Donita Brooks, MD  oxyCODONE-acetaminophen (PERCOCET) 7.5-325 MG tablet Take 1 tablet by mouth every 8 (eight) hours as needed for severe pain. 09/07/15  Yes Salley Scarlet, MD  simvastatin (ZOCOR) 10 MG tablet TAKE 1 TABLET BY MOUTH EVERY MORNING 08/10/15  Yes Salley Scarlet, MD     Allergies:  Allergies  Allergen Reactions  . Ace Inhibitors Cough  . Bentyl [Dicyclomine Hcl] Other (See Comments)    Blurry vision  . Penicillins Itching and Swelling    Social History:   reports that she has been smoking Cigarettes.  She has a 35 pack-year smoking history. She has never used smokeless tobacco. She reports that she does not drink alcohol or use illicit drugs.  Family History: Family History  Problem Relation Age of Onset  . Colon polyps Sister   . Cancer Mother     lung   . Cancer Father     stomach   . Stomach cancer Father     questionable  . Colon cancer Neg Hx      Physical Exam: Filed Vitals:   09/26/15 1130 09/26/15 1200 09/26/15 1230 09/26/15 1314  BP: 126/69 119/71 126/73 102/61  Pulse: 102 105 107 99  Temp:    98.6 F (37 C)  TempSrc:    Oral  Resp: 27 29 20 20   Height:      Weight:      SpO2: 97% 98% 80% 97%   Blood pressure 102/61, pulse 99, temperature 98.6 F (37 C), temperature source Oral, resp. rate 20, height 5' 2.5" (1.588 m), weight 95.709 kg (211 lb), SpO2 97 %.  GEN:  Pleasant  patient lying in the stretcher in no acute distress; cooperative with exam. PSYCH:  alert and oriented x4; does not appear anxious or depressed; affect is appropriate. HEENT: Mucous membranes pink and anicteric; PERRLA; EOM intact; no cervical lymphadenopathy nor thyromegaly or carotid bruit; no JVD; There were no stridor. Neck is very supple. Breasts:: Not examined CHEST WALL: No tenderness CHEST: Normal  respiration, clear to auscultation bilaterally.  HEART: Regular rate and rhythm.  There are no murmur, rub, or gallops.   BACK: No kyphosis or scoliosis; no CVA tenderness ABDOMEN: soft and non-tender; no masses, no organomegaly, normal abdominal bowel sounds; no pannus; no intertriginous candida. There is no rebound and no distention. Rectal Exam: Not done EXTREMITIES: No bone or joint deformity; age-appropriate arthropathy of the hands and knees; no edema; no ulcerations.  There is no calf tenderness. Genitalia: not examined PULSES: 2+ and symmetric SKIN: Normal hydration no rash or ulceration CNS: Cranial nerves 2-12 grossly intact no focal lateralizing neurologic deficit.  Speech is fluent; uvula elevated with phonation, facial symmetry and tongue midline. DTR are normal bilaterally, cerebella exam is intact, barbinski is negative and strengths are equaled bilaterally.  No sensory loss.   Labs on Admission:  Basic Metabolic Panel:  Recent Labs Lab 09/26/15 0900  NA 137  K 3.9  CL 101  CO2 24  GLUCOSE  388*  BUN 14  CREATININE 0.87  CALCIUM 9.1   Liver Function Tests:  Recent Labs Lab 09/26/15 0900  AST 20  ALT 24  ALKPHOS 207*  BILITOT 0.7  PROT 7.3  ALBUMIN 3.4*   CBC:  Recent Labs Lab 09/26/15 0900  WBC 8.6  NEUTROABS 8.1*  HGB 11.2*  HCT 31.9*  MCV 84.2  PLT 280   Radiological Exams on Admission: Dg Chest Portable 1 View  09/26/2015  CLINICAL DATA:  Per EMS: Pt woke up this morning not feeling well, laid down, cbg reading 446 for EMS. Pt took metformin last night. Pt has not taken medication this morning. Pt alert and oriented. EXAM: PORTABLE CHEST 1 VIEW COMPARISON:  08/31/2006 FINDINGS: The heart size and mediastinal contours are within normal limits. Both lungs are clear. No pleural effusion or pneumothorax. The visualized skeletal structures are unremarkable. IMPRESSION: No active disease. Electronically Signed   By: Amie Portland M.D.   On: 09/26/2015  09:20   Assessment/Plan Present on Admission:  . Sepsis (HCC) . Diabetes with neurologic complications (HCC) . Essential hypertension, benign . IBS (irritable bowel syndrome) . Obesity . Tobacco user . Type 2 diabetes mellitus with diabetic neuropathy (HCC)  PLAN:  Sepsis:  Will admit and tx for possible sepsis, though I suspect she has a viral infection, influenza not excluded.  Will start Tamiflu along with IV antibiotics, pending BC.  Will give IVF, and Tx symptomatically.  She did have some confusion but it resolved promptly with defevescence.   DM:  She is hyperglycemic, and likely hyperosmolar as well.  Will give IVF.  Use SSI. Hold Metformin.  HTN:  In the setting of soft BP, infection, and volume depletion, I will hold her anti HTN meds.   Other plans as per orders. Code Status: FULL CODE>    Houston Siren, MD. FACP [facility] Pager [phone] 7pm to 7am.  09/26/2015, 2:04 PM

## 2015-09-26 NOTE — ED Notes (Signed)
Hospitalist aware of bp.

## 2015-09-26 NOTE — ED Provider Notes (Signed)
The patient is a 65 year old female, she is a known diabetic, she presents to the hospital with a complaint of fever and altered mental status as well as hyperglycemia. According to the family member the patient had a pretty normal day yesterday but throughout the evening and this morning she has had increased fever, tachycardia, confusion and uncontrollable shaking. Husband is a poor historian himself and is unable to give very clear evidence as to what the confusion is but on exam the patient does appear to have some repetitive speech, she is unable answer all the questions appropriately but does answer questions. She is able to follow commands, she does have a diffuse right wrist show, she has nontender abdomen, her lung sounds appear to have some subtle rales at the bases but overall is in no distress. She is tachycardic to 130s, she has no peripheral edema, her mucous members are moist. She is febrile, tachycardic, there is some potential for hypoxia. Code sepsis has been activated, blood cultures, urine and urine culture, lactic acid, chest x-ray, IV fluids, antibiotics, anticipate admission. She does meet criteria for SIRS and for Q Sofa.  CRITICAL CARE Performed by: Johnna Acosta Total critical care time: 35 minutes Critical care time was exclusive of separately billable procedures and treating other patients. Critical care was necessary to treat or prevent imminent or life-threatening deterioration. Critical care was time spent personally by me on the following activities: development of treatment plan with patient and/or surrogate as well as nursing, discussions with consultants, evaluation of patient's response to treatment, examination of patient, obtaining history from patient or surrogate, ordering and performing treatments and interventions, ordering and review of laboratory studies, ordering and review of radiographic studies, pulse oximetry and re-evaluation of patient's  condition.  Medical screening examination/treatment/procedure(s) were conducted as a shared visit with non-physician practitioner(s) and myself.  I personally evaluated the patient during the encounter.  Clinical Impression:   Final diagnoses:  Sepsis, due to unspecified organism [facility])  Febrile illness  Hyperglycemia      Noemi Chapel, MD 09/26/15 1537

## 2015-09-26 NOTE — ED Notes (Signed)
Per EMS: Pt woke up this morning not feeling well, laid down, cbg reading 446 for EMS.  Pt took metformin last night.  Pt has not taken medication this morning.  Pt alert and oriented.  133 hr, 151/78

## 2015-09-26 NOTE — ED Provider Notes (Signed)
CSN: [id number]     Arrival date & time 09/26/15  [id number] History   First MD Initiated Contact with Patient 09/26/15 [phone]     Chief Complaint  Patient presents with  . Hyperglycemia     (Consider location/radiation/quality/duration/timing/severity/associated sxs/prior Treatment) The history is provided by the patient and the spouse.   Stephanie Hammond is a 65 y.o. female with a past medical history indicated below, most significant today for diabetes and recent history of diverticulitis which has resolved presenting with a one-day history of "not feeling well".  She reports generalized fatigue, myalgias and shaking chills.  She denies specific pain symptoms, denies sore throat, headache, chest pain, shortness of breath, nausea, vomiting, or diarrhea but does note a mild nonproductive cough. She reports severe weakness this morning, had difficulty getting out of bed and cannot stop shaking. Husband at the bedside states that the past several days she has had transient episodes of "acting delirious and confused".   Patient states she was recently placed on Viberzi for her IBS and thought she was having side effects from this medication so she stopped taking this week. She has had no medications for treatment of her fever.  She reports she did not get a flu shot this season.    Past Medical History  Diagnosis Date  . IBS (irritable bowel syndrome)   . GERD (gastroesophageal reflux disease)   . Anemia   . History of GI diverticular bleed 08/2004  . Diverticulitis   . Diverticulosis   . Osteoarthritis   . Diabetes mellitus   . Blood transfusion 2009    after GI bleed  . Gout   . CAD (coronary artery disease)   . Hypertension    Past Surgical History  Procedure Laterality Date  . Ptca    . Vaginal hysterectomy    . Cholecystectomy  2003  . Replacement total knee      right   . Colonoscopy  2000    Dr. Fuller Plan: marked diverticulosis, difficult procedure due to adhesions, polyps benign  .  Colonoscopy  2006    Dr. Henrene Pastor: severe pandiverticulosis  . Colonoscopy N/A 02/03/2013    SLF: 4 COLORECTAL POLYPS REMOVED/Moderate diverticulosis throughout the entire examined colon/Small internal hemorrhoids  . Joint replacement Right 2009    Knee Replacement Right   . Total knee arthroplasty Left 03/12/2015    Procedure: LEFT TOTAL KNEE REPLACEMENT;  Surgeon: Carole Civil, MD;  Location: [facility];  Service: Orthopedics;  Laterality: Left;   Family History  Problem Relation Age of Onset  . Colon polyps Sister   . Cancer Mother     lung   . Cancer Father     stomach   . Stomach cancer Father     questionable  . Colon cancer Neg Hx    Social History  Substance Use Topics  . Smoking status: Current Every Day Smoker -- 1.00 packs/day for 35 years    Types: Cigarettes  . Smokeless tobacco: Never Used  . Alcohol Use: No   OB History    Gravida Para Term Preterm AB TAB SAB Ectopic Multiple Living   2 2 2       2      Review of Systems  Constitutional: Positive for fever, chills and fatigue.  HENT: Negative for congestion and sore throat.   Eyes: Negative.   Respiratory: Negative for chest tightness, shortness of breath and wheezing.   Cardiovascular: Negative for chest pain.  Gastrointestinal: Negative for nausea, vomiting  and abdominal pain.  Genitourinary: Negative.   Musculoskeletal: Positive for myalgias. Negative for joint swelling, arthralgias and neck pain.  Skin: Negative.  Negative for rash and wound.  Neurological: Negative for dizziness, weakness, light-headedness, numbness and headaches.  Psychiatric/Behavioral: Negative.       Allergies  Ace inhibitors; Bentyl; and Penicillins  Home Medications   Prior to Admission medications   Medication Sig Start Date End Date Taking? Authorizing Provider  allopurinol (ZYLOPRIM) 300 MG tablet TAKE 1 TABLET BY MOUTH EVERY DAY 07/13/15  Yes Alycia Rossetti, MD  amLODipine (NORVASC) 10 MG tablet TAKE 1 TABLET BY MOUTH  EVERY DAY 01/19/15  Yes Alycia Rossetti, MD  Eluxadoline (VIBERZI) 75 MG TABS Take 75 mg by mouth 2 (two) times daily. 09/02/15  Yes Danie Binder, MD  losartan (COZAAR) 100 MG tablet TAKE 1 TABLET BY MOUTH EVERY DAY 08/10/15  Yes Alycia Rossetti, MD  metFORMIN (GLUCOPHAGE-XR) 500 MG 24 hr tablet TAKE 2 TABLETS(1000 MG) BY MOUTH DAILY WITH BREAKFAST 03/19/15  Yes Alycia Rossetti, MD  omeprazole (PRILOSEC) 20 MG capsule TAKE 1 CAPSULE BY MOUTH EVERY MORNING 04/28/15  Yes Susy Frizzle, MD  oxyCODONE-acetaminophen (PERCOCET) 7.5-325 MG tablet Take 1 tablet by mouth every 8 (eight) hours as needed for severe pain. 09/07/15  Yes Alycia Rossetti, MD  simvastatin (ZOCOR) 10 MG tablet TAKE 1 TABLET BY MOUTH EVERY MORNING 08/10/15  Yes Alycia Rossetti, MD   BP 126/69 mmHg  Pulse 102  Temp(Src) 98.7 F (37.1 C) (Oral)  Resp 27  Ht 5' 2.5" (1.588 m)  Wt 95.709 kg  BMI 37.95 kg/m2  SpO2 97% Physical Exam  Constitutional: She appears well-developed and well-nourished.  Shaking chills on exam.  Oriented, able to answer questions, some repetition. Became agitated when admission suggested.  HENT:  Head: Normocephalic and atraumatic.  Nose: Nose normal.  Mouth/Throat: Mucous membranes are dry. No oropharyngeal exudate, posterior oropharyngeal edema or posterior oropharyngeal erythema.  Eyes: Conjunctivae are normal.  Neck: Normal range of motion.  Cardiovascular: Regular rhythm, normal heart sounds and intact distal pulses.  Tachycardia present.   Cap refill in fingers 3 sec.  Pulmonary/Chest: Effort normal and breath sounds normal. She has no wheezes.  Abdominal: Soft. Bowel sounds are normal. She exhibits no distension. There is no tenderness. There is no guarding.  Musculoskeletal: Normal range of motion.  Neurological: She is alert.  Oriented x 3.  Skin: Skin is warm and dry.  Nursing note and vitals reviewed.   ED Course  Procedures (including critical care time) Labs Review Labs  Reviewed  COMPREHENSIVE METABOLIC PANEL - Abnormal; Notable for the following:    Glucose, Bld 388 (*)    Albumin 3.4 (*)    Alkaline Phosphatase 207 (*)    All other components within normal limits  URINALYSIS, ROUTINE W REFLEX MICROSCOPIC (NOT AT [facility]) - Abnormal; Notable for the following:    Glucose, UA >1000 (*)    All other components within normal limits  CBC WITH DIFFERENTIAL/PLATELET - Abnormal; Notable for the following:    RBC 3.79 (*)    Hemoglobin 11.2 (*)    HCT 31.9 (*)    Neutro Abs 8.1 (*)    Lymphs Abs 0.3 (*)    All other components within normal limits  URINE MICROSCOPIC-ADD ON - Abnormal; Notable for the following:    Squamous Epithelial / LPF 0-5 (*)    All other components within normal limits  I-STAT CG4 LACTIC ACID, ED -  Abnormal; Notable for the following:    Lactic Acid, Venous 2.34 (*)    All other components within normal limits  CULTURE, BLOOD (ROUTINE X 2)  CULTURE, BLOOD (ROUTINE X 2)  URINE CULTURE  INFLUENZA PANEL BY PCR (TYPE A & B, H1N1)  I-STAT CG4 LACTIC ACID, ED    Imaging Review Dg Chest Portable 1 View  09/26/2015  CLINICAL DATA:  Per EMS: Pt woke up this morning not feeling well, laid down, cbg reading 446 for EMS. Pt took metformin last night. Pt has not taken medication this morning. Pt alert and oriented. EXAM: PORTABLE CHEST 1 VIEW COMPARISON:  08/31/2006 FINDINGS: The heart size and mediastinal contours are within normal limits. Both lungs are clear. No pleural effusion or pneumothorax. The visualized skeletal structures are unremarkable. IMPRESSION: No active disease. Electronically Signed   By: Lajean Manes M.D.   On: 09/26/2015 09:20   I have personally reviewed and evaluated these images and lab results as part of my medical decision-making.   EKG Interpretation   Date/Time:  Sunday September 26 2015 09:00:04 EST Ventricular Rate:  133 PR Interval:  91 QRS Duration: 75 QT Interval:  296 QTC Calculation: 440 R Axis:    94 Text Interpretation:  Sinus tachycardia Consider right atrial enlargement  Indeterminate axis Borderline T abnormalities, lateral leads Since last  tracing rate faster Confirmed by MILLER  MD, BRIAN ([id number]) on 09/26/2015  10:53:23 AM      MDM   Final diagnoses:  Sepsis, due to unspecified organism ([place])  Febrile illness  Hyperglycemia    Pt with altered mental status, elevated cbg and fever 103.1 at presentation. Fever of unclear etiology, influenza panel still pending.  Pt meets sepsis criteria, she was given IV fluids, covered with vanc and levaquin.  Will plan admission for ongoing supportive care.  Discussed with Dr. Truman Hayward who will see pt in ed and write admission orders.  Requested start pt on Tamiflu, this was ordered.    Evalee Jefferson, PA-C 09/26/15 1157  Noemi Chapel, MD 09/26/15 1537

## 2015-09-26 NOTE — ED Notes (Signed)
CRITICAL VALUE ALERT  Critical value received: Lactic Acid 2.34   Date of notification: 09/26/2015  Time of notification:  0914  Critical value read back:  Nurse who received alert:  JJ   MD notified (1st page): Dr. Sabra Heck  Time of first page:    MD notified (2nd page):  Time of second page:  Responding MD:    Time MD responded:

## 2015-09-26 NOTE — Progress Notes (Signed)
ANTIBIOTIC CONSULT NOTE - INITIAL  Pharmacy Consult for Vancomycin Indication: sepsis  Allergies  Allergen Reactions  . Ace Inhibitors Cough  . Bentyl [Dicyclomine Hcl] Other (See Comments)    Blurry vision  . Penicillins Itching and Swelling    Patient Measurements: Height: 5' 2.5" (158.8 cm) Weight: 211 lb (95.709 kg) IBW/kg (Calculated) : 51.25 Adjusted Body Weight:   Vital Signs: Temp: 98.6 F (37 C) (02/19 1314) Temp Source: Oral (02/19 1314) BP: 106/58 mmHg (02/19 1412) Pulse Rate: 98 (02/19 1412) Intake/Output from previous day:   Intake/Output from this shift:    Labs:  Recent Labs  09/26/15 0900  WBC 8.6  HGB 11.2*  PLT 280  CREATININE 0.87   Estimated Creatinine Clearance: 73.1 mL/min (by C-G formula based on Cr of 0.87). No results for input(s): VANCOTROUGH, VANCOPEAK, VANCORANDOM, GENTTROUGH, GENTPEAK, GENTRANDOM, TOBRATROUGH, TOBRAPEAK, TOBRARND, AMIKACINPEAK, AMIKACINTROU, AMIKACIN in the last 72 hours.   Microbiology: Recent Results (from the past 720 hour(s))  Blood Culture (routine x 2)     Status: None (Preliminary result)   Collection Time: 09/26/15  9:39 AM  Result Value Ref Range Status   Specimen Description BLOOD IV DRAW  Final   Special Requests BOTTLES DRAWN AEROBIC AND ANAEROBIC [facility] EACH  Final   Culture PENDING  Incomplete   Report Status PENDING  Incomplete  Blood Culture (routine x 2)     Status: None (Preliminary result)   Collection Time: 09/26/15  9:44 AM  Result Value Ref Range Status   Specimen Description BLOOD RIGHT HAND  Final   Special Requests BOTTLES DRAWN AEROBIC AND ANAEROBIC 4CC EACH  Final   Culture PENDING  Incomplete   Report Status PENDING  Incomplete    Medical History: Past Medical History  Diagnosis Date  . IBS (irritable bowel syndrome)   . GERD (gastroesophageal reflux disease)   . Anemia   . History of GI diverticular bleed 08/2004  . Diverticulitis   . Diverticulosis   . Osteoarthritis   .  Diabetes mellitus   . Blood transfusion 2009    after GI bleed  . Gout   . CAD (coronary artery disease)   . Hypertension     Medications:  Scheduled:  . insulin aspart  0-15 Units Subcutaneous TID WC  . insulin aspart  0-5 Units Subcutaneous QHS   Assessment: Pt with altered mental status, elevated cbg and fever 103.1 at presentation. Fever of unclear etiology, influenza panel still pending. Pt meets sepsis criteria, she was given IV fluids, covered with vanc and levaquin. Vancomycin 1 GM IV given in ED  Goal of Therapy:  Vancomycin trough level 15-20 mcg/ml  Plan:  Vancomycin 1250 mg IV every 12 hours Vancomycin trough at steady state Monitor renal function Labs per protocol  Abner Greenspan, Aymara Sassi Bennett 09/26/2015,2:43 PM

## 2015-09-27 DIAGNOSIS — I10 Essential (primary) hypertension: Secondary | ICD-10-CM | POA: Diagnosis not present

## 2015-09-27 DIAGNOSIS — K589 Irritable bowel syndrome without diarrhea: Secondary | ICD-10-CM | POA: Diagnosis not present

## 2015-09-27 DIAGNOSIS — E669 Obesity, unspecified: Secondary | ICD-10-CM | POA: Diagnosis not present

## 2015-09-27 LAB — GLUCOSE, CAPILLARY
GLUCOSE-CAPILLARY: 277 mg/dL — AB (ref 65–99)
Glucose-Capillary: 374 mg/dL — ABNORMAL HIGH (ref 65–99)

## 2015-09-27 LAB — URINE CULTURE: Culture: NO GROWTH

## 2015-09-27 MED ORDER — LEVOFLOXACIN 500 MG PO TABS: 500.0000 mg | ORAL_TABLET | Freq: Every day | ORAL | Status: DC

## 2015-09-27 NOTE — Discharge Summary (Signed)
Physician Discharge Summary  Stephanie Hammond [id number] DOB: 1951-01-22 DOA: 09/26/2015  PCP: Milinda Antis, MD  Admit date: 09/26/2015 Discharge date: 09/27/2015  Time spent: 35 minutes  Recommendations for Outpatient Follow-up:  1. Follow up with your PCP next week.    Discharge Diagnoses:  Principal Problem:   Sepsis (HCC) Active Problems:   Diabetes with neurologic complications (HCC)   Obesity   Tobacco user   Essential hypertension, benign   IBS (irritable bowel syndrome)   Type 2 diabetes mellitus with diabetic neuropathy (HCC)   Discharge Condition: improved.  Felt better.  No fever.  No influenza.  BS 200's.   Diet recommendation: Carb modified diet.   Filed Weights   09/26/15 0856 09/26/15 1548  Weight: 95.709 kg (211 lb) 90.8 kg (200 lb 2.8 oz)    History of present illness:  Patient was admitted for possible sepsis vs viral infection along with volume depletion and hyperglycemia by me on Feb 19/ 2017.  As per my previous H and P:  " Stephanie Hammond is an 65 y.o. female with hx of DM on Metformin, GERD, IBS, HTN, CAD, lives with her husband at home, brought to the ER feeling malaise, chills, some myalgia, and lightheadedness, but no CP, coughs, HA, nausea, vomiting or diarrhea. She has no distant travel or ill contacts. Evaluation in the ER showed clear CXR and negative UA. Influenza test is pending. Her BP was soft, and her work up included normal renal fx test, no leukocytosis, and BS of 388. She was given IV Zenaida Niece and IV Levoquin, and hospitalist was asked to admit her for possible sepsis, suspicious for a viral infection, volume depletion and hyperglycemia.   Hospital Course: Patient was admitted into the hospital.  It was thought she may have a viral infection, influenza not excluded.  She was started empirically on Tamiflu, until the test came back negative, and her Tamiflu was discontinued.  Her volume depletion was repleted with IVF, and her BS came  down as expected.  Her BC were negative thus far, and she felt better.  She no longer had any chills or fever, and she is anxious to go home.  At this point, I am not certain she had sepsis, but will continue her on oral levaquin upon discharge.  She is stable for discharge, and will be discharge today, to follow up with her PCP.  She will complete oral antibiotic for another 5 days.   Thank you and good Day.   Consultations: None.  Discharge Exam: Filed Vitals:   09/26/15 2110 09/27/15 0542  BP: 127/64 136/54  Pulse: 89 87  Temp: 98.4 F (36.9 C) 99.6 F (37.6 C)  Resp: 20 20     Discharge Instructions   Discharge Instructions    Diet - low sodium heart healthy    Complete by:  As directed      Discharge instructions    Complete by:  As directed   Finish your antibiotics.  Follow up with your PCP next week.     Increase activity slowly    Complete by:  As directed           Current Discharge Medication List    START taking these medications   Details  levofloxacin (LEVAQUIN) 500 MG tablet Take 1 tablet (500 mg total) by mouth daily. Qty: 5 tablet, Refills: 0      CONTINUE these medications which have NOT CHANGED   Details  allopurinol (ZYLOPRIM) 300 MG tablet TAKE  1 TABLET BY MOUTH EVERY DAY Qty: 90 tablet, Refills: 0    amLODipine (NORVASC) 10 MG tablet TAKE 1 TABLET BY MOUTH EVERY DAY Qty: 90 tablet, Refills: 3    Eluxadoline (VIBERZI) 75 MG TABS Take 75 mg by mouth 2 (two) times daily. Qty: 60 tablet, Refills: 5    losartan (COZAAR) 100 MG tablet TAKE 1 TABLET BY MOUTH EVERY DAY Qty: 90 tablet, Refills: 0    metFORMIN (GLUCOPHAGE-XR) 500 MG 24 hr tablet TAKE 2 TABLETS(1000 MG) BY MOUTH DAILY WITH BREAKFAST Qty: 180 tablet, Refills: 2    omeprazole (PRILOSEC) 20 MG capsule TAKE 1 CAPSULE BY MOUTH EVERY MORNING Qty: 90 capsule, Refills: 3    oxyCODONE-acetaminophen (PERCOCET) 7.5-325 MG tablet Take 1 tablet by mouth every 8 (eight) hours as needed for  severe pain. Qty: 100 tablet, Refills: 0    simvastatin (ZOCOR) 10 MG tablet TAKE 1 TABLET BY MOUTH EVERY MORNING Qty: 90 tablet, Refills: 0       Allergies  Allergen Reactions  . Ace Inhibitors Cough  . Bentyl [Dicyclomine Hcl] Other (See Comments)    Blurry vision  . Penicillins Itching and Swelling      The results of significant diagnostics from this hospitalization (including imaging, microbiology, ancillary and laboratory) are listed below for reference.    Significant Diagnostic Studies: Dg Chest Portable 1 View  09/26/2015  CLINICAL DATA:  Per EMS: Pt woke up this morning not feeling well, laid down, cbg reading 446 for EMS. Pt took metformin last night. Pt has not taken medication this morning. Pt alert and oriented. EXAM: PORTABLE CHEST 1 VIEW COMPARISON:  08/31/2006 FINDINGS: The heart size and mediastinal contours are within normal limits. Both lungs are clear. No pleural effusion or pneumothorax. The visualized skeletal structures are unremarkable. IMPRESSION: No active disease. Electronically Signed   By: Amie Portland M.D.   On: 09/26/2015 09:20    Microbiology: Recent Results (from the past 240 hour(s))  Blood Culture (routine x 2)     Status: None (Preliminary result)   Collection Time: 09/26/15  9:39 AM  Result Value Ref Range Status   Specimen Description BLOOD IV DRAW  Final   Special Requests BOTTLES DRAWN AEROBIC AND ANAEROBIC 6CC EACH  Final   Culture NO GROWTH 1 DAY  Final   Report Status PENDING  Incomplete  Blood Culture (routine x 2)     Status: None (Preliminary result)   Collection Time: 09/26/15  9:44 AM  Result Value Ref Range Status   Specimen Description BLOOD RIGHT HAND  Final   Special Requests BOTTLES DRAWN AEROBIC AND ANAEROBIC 4CC EACH  Final   Culture NO GROWTH 1 DAY  Final   Report Status PENDING  Incomplete  Urine culture     Status: None   Collection Time: 09/26/15 10:53 AM  Result Value Ref Range Status   Specimen Description  URINE, CATHETERIZED  Final   Special Requests NONE  Final   Culture   Final    NO GROWTH 1 DAY Performed at [facility]    Report Status 09/27/2015 FINAL  Final     Labs: Basic Metabolic Panel:  Recent Labs Lab 09/26/15 0900  NA 137  K 3.9  CL 101  CO2 24  GLUCOSE 388*  BUN 14  CREATININE 0.87  CALCIUM 9.1   Liver Function Tests:  Recent Labs Lab 09/26/15 0900  AST 20  ALT 24  ALKPHOS 207*  BILITOT 0.7  PROT 7.3  ALBUMIN 3.4*  No results for input(s): LIPASE, AMYLASE in the last 168 hours. No results for input(s): AMMONIA in the last 168 hours. CBC:  Recent Labs Lab 09/26/15 0900  WBC 8.6  NEUTROABS 8.1*  HGB 11.2*  HCT 31.9*  MCV 84.2  PLT 280    CBG:  Recent Labs Lab 09/26/15 1700 09/26/15 2102 09/27/15 0802 09/27/15 1255  GLUCAP 371* 357* 277* 374*   Signed:  Nahun Kronberg MD.  [facility] 09/27/2015, 2:32 PM

## 2015-09-27 NOTE — Progress Notes (Signed)
Discharge instructions given, verbalized understanding, out in stable condition via w/c with staff. 

## 2015-09-27 NOTE — Care Management Note (Signed)
Case Management Note  Patient Details  Name: Stephanie Hammond MRN: [id number] Date of Birth: 03-Aug-1951  Subjective/Objective:           Spoke with patient for discharge planning. Patient is alert oriented from home with husband. Drives self and continues to work. Denies any medication or transportation needs. No Cm needs identifies.         Action/Plan:  Home with self care. Expected Discharge Date:                  Expected Discharge Plan:     In-House Referral:     Discharge planning Services     Post Acute Care Choice:    Choice offered to:     DME Arranged:    DME Agency:     HH Arranged:    [place] Agency:     Status of Service:     Medicare Important Message Given:    Date Medicare IM Given:    Medicare IM give by:    Date Additional Medicare IM Given:    Additional Medicare Important Message give by:     If discussed at [place] of Stay Meetings, dates discussed:    Additional Comments:  Alvie Heidelberg, RN 09/27/2015, 9:15 AM

## 2015-09-27 NOTE — Care Management Obs Status (Signed)
[place] NOTIFICATION   Patient Details  Name: Stephanie Hammond MRN: [id number] Date of Birth: 1951-07-24   Medicare Observation Status Notification Given:  Yes    Alvie Heidelberg, RN 09/27/2015, 9:15 AM

## 2015-09-29 ENCOUNTER — Telehealth: Payer: Self-pay | Admitting: *Deleted

## 2015-09-29 MED ORDER — INSULIN GLARGINE 100 UNIT/ML ~~LOC~~ SOLN: 5.0000 [IU] | Freq: Every day | SUBCUTANEOUS | Status: DC

## 2015-09-29 NOTE — Telephone Encounter (Signed)
Pt aware script called into pharmacy

## 2015-09-29 NOTE — Telephone Encounter (Signed)
Okay to send in Lantus 5 units at bedtime

## 2015-09-29 NOTE — Telephone Encounter (Signed)
Pt calling stating that she is on antibotic and is causing her BS to rise, states this am it is 490, wants to know if you can prescribe her insulin while on the antibiotic to help get it straight. Please advise  [place]  Call back number [phone]

## 2015-09-30 ENCOUNTER — Telehealth: Payer: Self-pay | Admitting: *Deleted

## 2015-09-30 NOTE — Telephone Encounter (Signed)
Received fax from pharmacy requesting PA on Lantus.   [employer] Formulary alternatives include Levemir and [place].   MD please advise.

## 2015-10-01 LAB — CULTURE, BLOOD (ROUTINE X 2)
Culture: NO GROWTH
Culture: NO GROWTH

## 2015-10-01 MED ORDER — INSULIN DETEMIR 100 UNIT/ML FLEXPEN: 5.0000 [IU] | PEN_INJECTOR | Freq: Every day | SUBCUTANEOUS | Status: DC

## 2015-10-01 NOTE — Telephone Encounter (Signed)
Give Levemir 5 units at bedtime instead or give her sample if we have one

## 2015-10-01 NOTE — Telephone Encounter (Signed)
Prescription sent to pharmacy.

## 2015-10-06 ENCOUNTER — Encounter: Payer: Self-pay | Admitting: Family Medicine

## 2015-10-06 ENCOUNTER — Other Ambulatory Visit: Payer: Self-pay | Admitting: Family Medicine

## 2015-10-06 ENCOUNTER — Ambulatory Visit (INDEPENDENT_AMBULATORY_CARE_PROVIDER_SITE_OTHER): Payer: Medicare HMO | Admitting: Family Medicine

## 2015-10-06 VITALS — BP 136/64 | HR 68 | Temp 98.9°F | Resp 14 | Ht 63.0 in | Wt 198.0 lb

## 2015-10-06 DIAGNOSIS — I10 Essential (primary) hypertension: Secondary | ICD-10-CM | POA: Diagnosis not present

## 2015-10-06 DIAGNOSIS — D649 Anemia, unspecified: Secondary | ICD-10-CM | POA: Diagnosis not present

## 2015-10-06 DIAGNOSIS — E1140 Type 2 diabetes mellitus with diabetic neuropathy, unspecified: Secondary | ICD-10-CM

## 2015-10-06 LAB — GLUCOSE, FINGERSTICK (STAT): GLUCOSE, FINGERSTICK: 349 mg/dL — AB (ref 70–99)

## 2015-10-06 MED ORDER — OXYCODONE-ACETAMINOPHEN 7.5-325 MG PO TABS: 1.0000 | ORAL_TABLET | Freq: Three times a day (TID) | ORAL | Status: DC | PRN

## 2015-10-06 MED ORDER — INSULIN DETEMIR 100 UNIT/ML FLEXPEN: PEN_INJECTOR | SUBCUTANEOUS | Status: DC

## 2015-10-06 NOTE — Assessment & Plan Note (Signed)
Well controlled, no change to meds 

## 2015-10-06 NOTE — Assessment & Plan Note (Addendum)
Very labile blood sugars despite her A1c though was a fingerstick A1c 3 weeks ago being 7.1% her blood sugars are extremely elevated. The repeat a venous draw an A1c today her blood sugars are ranging 200-500 without being on any insulin. We'll get a start Levemir 10 units. In the past when she was on insulin along with her oral medication she did not require very much but we would also have hypoglycemia at times. I will send her to an endocrinologist for further workup for the diabetes as I cannot explain while she fluctuates significantly with her blood sugars. It is possible that shaking and everything which is related to her high blood sugars is no other cause of sepsis was found.   In the office her blood sugars in the 300s however she had not eaten anything solid concerned about giving her a dose of NovoLog and says she is going to leave from here to have a meal and she would take her injection of Levemir early

## 2015-10-06 NOTE — Progress Notes (Signed)
Patient ID: Stephanie Hammond, female   DOB: 1950/12/27, 65 y.o.   MRN: [id number]   Subjective:    Patient ID: Stephanie Hammond, female    DOB: January 11, 1951, 65 y.o.   MRN: [id number]  Patient presents for Hospital F/U  patient here for hospital follow-up. She was admitted with diagnosis of sepsis on February 19. Today's proceedings she would have these episodes of shaking and chills but otherwise no other symptoms. The morning she went to the hospital she felt significantly fatigued and weak she became altered. Her husband to the hospital there was concern for sepsis possible influenza all of her tests however came back negative for cultures her influenza or chest x-ray her urine sample. Her blood sugar was elevated for A1c done just 3 weeks prior to the [facility] [place] was 7.1%. She was treated with systemic IV antibiotics vancomycin but this was discontinued after no source of infection was found. She was sent home with 5 days of Levaquin which she completed last Saturday. She did call in last week for Miquel Dunn her sugars being elevated at 1 and 300s to 400 she had one almost at 500. We called in Levemir however she had difficulty getting this from the pharmacy says she has not been on any insulin and did not make [place] aware of this. When her blood sugar is elevated she continues to get the shakes. Her renal function is normal her liver function also normal. She is still taking the metformin as prescribed.    Review Of Systems:  GEN- denies fatigue, fever, weight loss,weakness, recent illness HEENT- denies eye drainage, change in vision, nasal discharge, CVS- denies chest pain, palpitations RESP- denies SOB, cough, wheeze ABD- denies N/V, change in stools, abd pain GU- denies dysuria, hematuria, dribbling, incontinence MSK- denies joint pain, muscle aches, injury Neuro- denies headache, dizziness, syncope, seizure activity       Objective:    BP 136/64 mmHg  Pulse 68  Temp(Src) 98.9 F (37.2  C) (Oral)  Resp 14  Ht 5\' 3"  (1.6 m)  Wt 198 lb (89.812 kg)  BMI 35.08 kg/m2 GEN- NAD, alert and oriented x3 HEENT- PERRL, EOMI, non injected sclera, pink conjunctiva, MMM, oropharynx clear CVS- RRR, no murmur RESP-CTAB ABD-NABS,soft,NT,ND EXT- No edema Pulses- Radial 2+        Assessment & Plan:      Problem List Items Addressed This Visit    Essential hypertension, benign    Well controlled, no change to meds      Relevant Orders   Basic metabolic panel   Diabetes with neurologic complications (HCC) - Primary    Very labile blood sugars despite her A1c though was a fingerstick A1c 3 weeks ago being 7.1% her blood sugars are extremely elevated. The repeat a venous draw an A1c today her blood sugars are ranging 200-500 without being on any insulin. We'll get a start Levemir 10 units. In the past when she was on insulin along with her oral medication she did not require very much but we would also have hypoglycemia at times. I will send her to an endocrinologist for further workup for the diabetes as I cannot explain while she fluctuates significantly with her blood sugars. It is possible that shaking and everything which is related to her high blood sugars is no other cause of sepsis was found.      Relevant Medications   Insulin Detemir (LEVEMIR) 100 UNIT/ML Pen   Other Relevant Orders   Basic metabolic panel  CBC with Differential/Platelet   Hemoglobin A1c   Glucose, fingerstick (stat) (Completed)   Ambulatory referral to Endocrinology      Note: This dictation was prepared with Dragon dictation along with smaller phrase technology. Any transcriptional errors that result from this process are unintentional.

## 2015-10-06 NOTE — Patient Instructions (Signed)
Take Levemir 10 units at bedtime Continue metformin Referral to endocrinology [place] Diabetes Mellitus and Food It is important for you to manage your blood sugar (glucose) level. Your blood glucose level can be greatly affected by what you eat. Eating healthier foods in the appropriate amounts throughout the day at about the same time each day will help you control your blood glucose level. It can also help slow or prevent worsening of your diabetes mellitus. Healthy eating may even help you improve the level of your blood pressure and reach or maintain a healthy weight.  General recommendations for healthful eating and cooking habits include:  Eating meals and snacks regularly. Avoid going long periods of time without eating to lose weight.  Eating a diet that consists mainly of plant-based foods, such as fruits, vegetables, nuts, legumes, and whole grains.  Using low-heat cooking methods, such as baking, instead of high-heat cooking methods, such as deep frying. Work with your dietitian to make sure you understand how to use the Nutrition Facts information on food labels. HOW CAN FOOD AFFECT ME? Carbohydrates Carbohydrates affect your blood glucose level more than any other type of food. Your dietitian will help you determine how many carbohydrates to eat at each meal and teach you how to count carbohydrates. Counting carbohydrates is important to keep your blood glucose at a healthy level, especially if you are using insulin or taking certain medicines for diabetes mellitus. Alcohol Alcohol can cause sudden decreases in blood glucose (hypoglycemia), especially if you use insulin or take certain medicines for diabetes mellitus. Hypoglycemia can be a life-threatening condition. Symptoms of hypoglycemia (sleepiness, dizziness, and disorientation) are similar to symptoms of having too much alcohol.  If your health care provider has given you approval to drink alcohol, do so in moderation  and use the following guidelines:  Women should not have more than one drink per day, and men should not have more than two drinks per day. One drink is equal to:  12 oz of beer.  5 oz of wine.  1 oz of hard liquor.  Do not drink on an empty stomach.  Keep yourself hydrated. Have water, diet soda, or unsweetened iced tea.  Regular soda, juice, and other mixers might contain a lot of carbohydrates and should be counted. WHAT FOODS ARE NOT RECOMMENDED? As you make food choices, it is important to remember that all foods are not the same. Some foods have fewer nutrients per serving than other foods, even though they might have the same number of calories or carbohydrates. It is difficult to get your body what it needs when you eat foods with fewer nutrients. Examples of foods that you should avoid that are high in calories and carbohydrates but low in nutrients include:  Trans fats (most processed foods list trans fats on the Nutrition Facts label).  Regular soda.  Juice.  Candy.  Sweets, such as cake, pie, doughnuts, and cookies.  Fried foods. WHAT FOODS CAN I EAT? Eat nutrient-rich foods, which will nourish your body and keep you healthy. The food you should eat also will depend on several factors, including:  The calories you need.  The medicines you take.  Your weight.  Your blood glucose level.  Your blood pressure level.  Your cholesterol level. You should eat a variety of foods, including:  Protein.  Lean cuts of meat.  Proteins low in saturated fats, such as fish, egg whites, and beans. Avoid processed meats.  Fruits and vegetables.  Fruits  and vegetables that may help control blood glucose levels, such as apples, mangoes, and yams.  Dairy products.  Choose fat-free or low-fat dairy products, such as milk, yogurt, and cheese.  Grains, bread, pasta, and rice.  Choose whole grain products, such as multigrain bread, whole oats, and brown rice. These  foods may help control blood pressure.  Fats.  Foods containing healthful fats, such as nuts, avocado, olive oil, canola oil, and fish. DOES EVERYONE WITH DIABETES MELLITUS HAVE THE SAME MEAL PLAN? Because every person with diabetes mellitus is different, there is not one meal plan that works for everyone. It is very important that you meet with a dietitian who will help you create a meal plan that is just right for you.   This information is not intended to replace advice given to you by your health care provider. Make sure you discuss any questions you have with your health care provider.   Document Released: 04/20/2005 Document Revised: 08/14/2014 Document Reviewed: 06/20/2013 Elsevier Interactive Patient Education [employer].

## 2015-10-07 LAB — CBC WITH DIFFERENTIAL/PLATELET
BASOS ABS: 0.1 10*3/uL (ref 0.0–0.1)
BASOS PCT: 1 % (ref 0–1)
EOS ABS: 0.1 10*3/uL (ref 0.0–0.7)
Eosinophils Relative: 1 % (ref 0–5)
HCT: 32 % — ABNORMAL LOW (ref 36.0–46.0)
HEMOGLOBIN: 9.9 g/dL — AB (ref 12.0–15.0)
Lymphocytes Relative: 25 % (ref 12–46)
Lymphs Abs: 1.6 10*3/uL (ref 0.7–4.0)
MCH: 28 pg (ref 26.0–34.0)
MCHC: 30.9 g/dL (ref 30.0–36.0)
MCV: 90.4 fL (ref 78.0–100.0)
MONOS PCT: 6 % (ref 3–12)
MPV: 9.4 fL (ref 8.6–12.4)
Monocytes Absolute: 0.4 10*3/uL (ref 0.1–1.0)
NEUTROS ABS: 4.3 10*3/uL (ref 1.7–7.7)
Neutrophils Relative %: 67 % (ref 43–77)
PLATELETS: 426 10*3/uL — AB (ref 150–400)
RBC: 3.54 MIL/uL — AB (ref 3.87–5.11)
RDW: 14.8 % (ref 11.5–15.5)
WBC: 6.4 10*3/uL (ref 4.0–10.5)

## 2015-10-07 LAB — BASIC METABOLIC PANEL
BUN: 11 mg/dL (ref 7–25)
CALCIUM: 8.8 mg/dL (ref 8.6–10.4)
CO2: 23 mmol/L (ref 20–31)
Chloride: 104 mmol/L (ref 98–110)
Creat: 0.82 mg/dL (ref 0.50–0.99)
GLUCOSE: 245 mg/dL — AB (ref 70–99)
Potassium: 4.2 mmol/L (ref 3.5–5.3)
SODIUM: 138 mmol/L (ref 135–146)

## 2015-10-07 LAB — HEMOGLOBIN A1C
HEMOGLOBIN A1C: 8.1 % — AB (ref <5.7)
MEAN PLASMA GLUCOSE: 186 mg/dL — AB (ref <117)

## 2015-10-11 ENCOUNTER — Other Ambulatory Visit: Payer: Self-pay | Admitting: *Deleted

## 2015-10-11 DIAGNOSIS — D649 Anemia, unspecified: Secondary | ICD-10-CM

## 2015-10-12 LAB — IRON AND TIBC
%SAT: 17 % (ref 11–50)
IRON: 53 ug/dL (ref 45–160)
TIBC: 307 ug/dL (ref 250–450)
UIBC: 254 ug/dL (ref 125–400)

## 2015-10-17 ENCOUNTER — Other Ambulatory Visit: Payer: Self-pay | Admitting: Family Medicine

## 2015-10-17 DIAGNOSIS — R69 Illness, unspecified: Secondary | ICD-10-CM | POA: Diagnosis not present

## 2015-10-18 NOTE — Telephone Encounter (Signed)
Refill appropriate and filled per protocol. 

## 2015-11-01 ENCOUNTER — Telehealth: Payer: Self-pay | Admitting: Family Medicine

## 2015-11-01 NOTE — Telephone Encounter (Signed)
Okay to refill, okay to give sample if we have of Levemir

## 2015-11-01 NOTE — Telephone Encounter (Signed)
Ok to refill??  Last office visit /refill 10/06/2015.

## 2015-11-01 NOTE — Telephone Encounter (Signed)
Pt need a refill of Oxycodone. She aslo requests a sample of Levimir [phone]

## 2015-11-02 MED ORDER — OXYCODONE-ACETAMINOPHEN 7.5-325 MG PO TABS: 1.0000 | ORAL_TABLET | Freq: Three times a day (TID) | ORAL | Status: DC | PRN

## 2015-11-02 NOTE — Telephone Encounter (Signed)
Prescription printed and patient made aware to come to office to pick up after 2pm on 11/02/2015.

## 2015-11-08 ENCOUNTER — Telehealth: Payer: Self-pay | Admitting: Family Medicine

## 2015-11-08 MED ORDER — PEN NEEDLES 31G X 8 MM MISC: 1.0000 | Freq: Every day | Status: DC

## 2015-11-08 NOTE — Telephone Encounter (Signed)
Needles sent to pharmacy

## 2015-11-08 NOTE — Telephone Encounter (Signed)
Pt needs needles for lantus pen sent in to the [facility] on [address]

## 2015-11-12 ENCOUNTER — Ambulatory Visit: Payer: Medicare HMO | Admitting: Endocrinology

## 2015-11-14 ENCOUNTER — Other Ambulatory Visit: Payer: Self-pay | Admitting: Family Medicine

## 2015-11-15 NOTE — Telephone Encounter (Signed)
Refill appropriate and filled per protocol. 

## 2015-11-16 ENCOUNTER — Encounter: Payer: Self-pay | Admitting: Gastroenterology

## 2015-11-18 ENCOUNTER — Encounter: Payer: Self-pay | Admitting: Gastroenterology

## 2015-11-24 ENCOUNTER — Encounter: Payer: Self-pay | Admitting: Gastroenterology

## 2015-12-01 ENCOUNTER — Telehealth: Payer: Self-pay | Admitting: Family Medicine

## 2015-12-01 MED ORDER — OXYCODONE-ACETAMINOPHEN 7.5-325 MG PO TABS: 1.0000 | ORAL_TABLET | Freq: Three times a day (TID) | ORAL | Status: DC | PRN

## 2015-12-01 NOTE — Telephone Encounter (Signed)
Ok to refill??  Last office visit 10/06/2015.  Last refill 11/02/2015.

## 2015-12-01 NOTE — Telephone Encounter (Signed)
Prescription printed and patient made aware to come to office to pick up after 4pm on 12/01/2015.

## 2015-12-01 NOTE — Telephone Encounter (Signed)
Okay to refill? 

## 2015-12-01 NOTE — Telephone Encounter (Signed)
Patient is requesting a prescription for her oxyCODONE-acetaminophen (PERCOCET) 7.5-325 MG tablet   CB# [phone]

## 2015-12-03 ENCOUNTER — Ambulatory Visit: Payer: Medicare HMO | Admitting: Endocrinology

## 2015-12-10 ENCOUNTER — Ambulatory Visit: Payer: Medicare HMO | Admitting: Family Medicine

## 2015-12-24 ENCOUNTER — Ambulatory Visit: Payer: Medicare HMO | Admitting: Endocrinology

## 2015-12-31 ENCOUNTER — Encounter: Payer: Self-pay | Admitting: Family Medicine

## 2015-12-31 ENCOUNTER — Ambulatory Visit (INDEPENDENT_AMBULATORY_CARE_PROVIDER_SITE_OTHER): Payer: Medicare HMO | Admitting: Family Medicine

## 2015-12-31 VITALS — BP 136/64 | HR 70 | Temp 98.3°F | Resp 14 | Ht 63.0 in | Wt 198.0 lb

## 2015-12-31 DIAGNOSIS — I10 Essential (primary) hypertension: Secondary | ICD-10-CM | POA: Diagnosis not present

## 2015-12-31 DIAGNOSIS — D649 Anemia, unspecified: Secondary | ICD-10-CM | POA: Diagnosis not present

## 2015-12-31 DIAGNOSIS — E1140 Type 2 diabetes mellitus with diabetic neuropathy, unspecified: Secondary | ICD-10-CM

## 2015-12-31 MED ORDER — METFORMIN HCL ER 500 MG PO TB24: ORAL_TABLET | ORAL | Status: DC

## 2015-12-31 MED ORDER — OXYCODONE-ACETAMINOPHEN 7.5-325 MG PO TABS: 1.0000 | ORAL_TABLET | Freq: Three times a day (TID) | ORAL | Status: DC | PRN

## 2015-12-31 NOTE — Progress Notes (Signed)
Patient ID: Stephanie Hammond, female   DOB: 01/15/1951, 65 y.o.   MRN: [id number]    Subjective:    Patient ID: Stephanie Hammond, female    DOB: 1950/08/20, 66 y.o.   MRN: [id number]  Patient presents for Medication Review/ Refill  Pt here to f/u chronic medical problems. She has not seen endocrine had to reschedule multiple times, husband had 2 heart attacks.   Does not have meter with her, states CBG are up and down. Last A1C 8.1% in March, now to see endocrine in 2 weeks.   Anemia- noted on labs, she was suppose to return stool cards but did not do so. Hb was 9.9. She denies any gross bleeding, colonoscopy a few years ago showed polyps and internal hemorroids.   Request refill on pain meds     Review Of Systems:  GEN- + fatigue, fever, weight loss,weakness, recent illness HEENT- denies eye drainage, change in vision, nasal discharge, CVS- denies chest pain, palpitations RESP- denies SOB, cough, wheeze ABD- denies N/V, change in stools, abd pain GU- denies dysuria, hematuria, dribbling, incontinence MSK- + joint pain, muscle aches, injury Neuro- denies headache, dizziness, syncope, seizure activity       Objective:    BP 136/64 mmHg  Pulse 70  Temp(Src) 98.3 F (36.8 C) (Oral)  Resp 14  Ht 5\' 3"  (1.6 m)  Wt 198 lb (89.812 kg)  BMI 35.08 kg/m2 GEN- NAD, alert and oriented x3 HEENT- PERRL, EOMI, non injected sclera, pink conjunctiva, MMM, oropharynx clear Neck- Supple, no thyromegaly CVS- RRR, no murmur RESP-CTAB ABD-NABS,soft,NT,ND EXT- No edema Pulses- Radial, DP- 2+        Assessment & Plan:      Problem List Items Addressed This Visit    Essential hypertension, benign    Well controlled Check iron TIBC for anemia, will have her return stool cards, if positive will send to GI for repeat scope      Diabetes with neurologic complications ([place]) - Primary    Diabetes uncontrolled but very labile CBG, will obtain a new meter for pt She will continue  levemir 10 units, and MTF until she sees endocrine  Urine micro today, on ACE, Statin       Relevant Medications   metFORMIN (GLUCOPHAGE-XR) 500 MG 24 hr tablet   Other Relevant Orders   Comprehensive metabolic panel   Lipid panel   Microalbumin / creatinine urine ratio   Anemia   Relevant Orders   CBC with Differential/Platelet   Iron and TIBC      Note: This dictation was prepared with Dragon dictation along with smaller phrase technology. Any transcriptional errors that result from this process are unintentional.

## 2015-12-31 NOTE — Patient Instructions (Signed)
We will call with lab results F/U 4 Months

## 2015-12-31 NOTE — Assessment & Plan Note (Signed)
Diabetes uncontrolled but very labile CBG, will obtain a new meter for pt She will continue levemir 10 units, and MTF until she sees endocrine  Urine micro today, on ACE, Statin

## 2015-12-31 NOTE — Assessment & Plan Note (Signed)
Well controlled Check iron TIBC for anemia, will have her return stool cards, if positive will send to GI for repeat scope

## 2016-01-01 LAB — MICROALBUMIN / CREATININE URINE RATIO
CREATININE, URINE: 187 mg/dL (ref 20–320)
Microalb Creat Ratio: 6 mcg/mg creat (ref <30)
Microalb, Ur: 1.1 mg/dL

## 2016-01-17 ENCOUNTER — Other Ambulatory Visit: Payer: Medicare HMO

## 2016-01-17 DIAGNOSIS — D649 Anemia, unspecified: Secondary | ICD-10-CM | POA: Diagnosis not present

## 2016-01-18 LAB — FECAL OCCULT BLOOD, IMMUNOCHEMICAL: Fecal Occult Blood: NEGATIVE

## 2016-01-21 ENCOUNTER — Ambulatory Visit: Payer: Medicare HMO | Admitting: Endocrinology

## 2016-01-24 ENCOUNTER — Telehealth: Payer: Self-pay | Admitting: Family Medicine

## 2016-01-24 NOTE — Telephone Encounter (Signed)
Call placed to patient to inquire.   Advised stool samples have not resulted at this time.

## 2016-01-24 NOTE — Telephone Encounter (Signed)
Pt is returning a call for her lab results. [phone]

## 2016-01-26 DIAGNOSIS — R69 Illness, unspecified: Secondary | ICD-10-CM | POA: Diagnosis not present

## 2016-01-28 DIAGNOSIS — E109 Type 1 diabetes mellitus without complications: Secondary | ICD-10-CM | POA: Diagnosis not present

## 2016-01-28 DIAGNOSIS — H5210 Myopia, unspecified eye: Secondary | ICD-10-CM | POA: Diagnosis not present

## 2016-01-28 DIAGNOSIS — H2510 Age-related nuclear cataract, unspecified eye: Secondary | ICD-10-CM | POA: Diagnosis not present

## 2016-01-28 DIAGNOSIS — I10 Essential (primary) hypertension: Secondary | ICD-10-CM | POA: Diagnosis not present

## 2016-02-01 ENCOUNTER — Other Ambulatory Visit: Payer: Self-pay | Admitting: Family Medicine

## 2016-02-01 NOTE — Telephone Encounter (Signed)
Refill appropriate and filled per protocol. 

## 2016-02-07 ENCOUNTER — Telehealth: Payer: Self-pay | Admitting: Family Medicine

## 2016-02-07 MED ORDER — OXYCODONE-ACETAMINOPHEN 7.5-325 MG PO TABS: 1.0000 | ORAL_TABLET | Freq: Three times a day (TID) | ORAL | Status: DC | PRN

## 2016-02-07 NOTE — Telephone Encounter (Signed)
Prescription printed and patient made aware to come to office to pick up.  

## 2016-02-07 NOTE — Telephone Encounter (Signed)
Patient calling requesting lab results states she had lab tech take the needle out after she kept going deeper in her arm and not getting anymore blood. She states that she was told by lab tech that she thought she had enough to run test. However she hasn't heard anything from results.  Also requesting refill on her oxyCODONE-acetaminophen (PERCOCET) 7.5-325 MG tablet    CB# [phone]

## 2016-02-07 NOTE — Telephone Encounter (Signed)
Ok to refill??  Last office visit/ refill 12/31/2015.  Lab looking into why blood work has not resulted.

## 2016-02-07 NOTE — Telephone Encounter (Signed)
Advised that labs will need to be re-drawn fasting.

## 2016-02-07 NOTE — Telephone Encounter (Signed)
Needs to be redrawn  Okay to refill pain meds Needs to be fasting

## 2016-02-14 ENCOUNTER — Other Ambulatory Visit: Payer: Self-pay | Admitting: Family Medicine

## 2016-02-14 DIAGNOSIS — E1140 Type 2 diabetes mellitus with diabetic neuropathy, unspecified: Secondary | ICD-10-CM | POA: Diagnosis not present

## 2016-02-14 DIAGNOSIS — D649 Anemia, unspecified: Secondary | ICD-10-CM | POA: Diagnosis not present

## 2016-02-14 DIAGNOSIS — E119 Type 2 diabetes mellitus without complications: Secondary | ICD-10-CM | POA: Diagnosis not present

## 2016-02-15 ENCOUNTER — Ambulatory Visit: Payer: Medicare HMO | Admitting: Endocrinology

## 2016-02-15 ENCOUNTER — Other Ambulatory Visit: Payer: Medicare HMO

## 2016-02-15 LAB — CBC WITH DIFFERENTIAL/PLATELET
BASOS ABS: 64 {cells}/uL (ref 0–200)
Basophils Relative: 1 %
EOS ABS: 192 {cells}/uL (ref 15–500)
Eosinophils Relative: 3 %
HCT: 37.4 % (ref 35.0–45.0)
Hemoglobin: 12.8 g/dL (ref 12.0–15.0)
LYMPHS PCT: 29 %
Lymphs Abs: 1856 cells/uL (ref 850–3900)
MCH: 31.5 pg (ref 27.0–33.0)
MCHC: 34.2 g/dL (ref 32.0–36.0)
MCV: 92.1 fL (ref 80.0–100.0)
MONOS PCT: 6 %
MPV: 10 fL (ref 7.5–12.5)
Monocytes Absolute: 384 cells/uL (ref 200–950)
Neutro Abs: 3904 cells/uL (ref 1500–7800)
Neutrophils Relative %: 61 %
PLATELETS: 366 10*3/uL (ref 140–400)
RBC: 4.06 MIL/uL (ref 3.80–5.10)
RDW: 14.2 % (ref 11.0–15.0)
WBC: 6.4 10*3/uL (ref 3.8–10.8)

## 2016-02-16 LAB — COMPREHENSIVE METABOLIC PANEL
ALT: 14 U/L (ref 6–29)
AST: 13 U/L (ref 10–35)
Albumin: 4.1 g/dL (ref 3.6–5.1)
Alkaline Phosphatase: 125 U/L (ref 33–130)
BUN: 16 mg/dL (ref 7–25)
CHLORIDE: 104 mmol/L (ref 98–110)
CO2: 24 mmol/L (ref 20–31)
Calcium: 9.7 mg/dL (ref 8.6–10.4)
Creat: 0.85 mg/dL (ref 0.50–0.99)
GLUCOSE: 124 mg/dL — AB (ref 70–99)
POTASSIUM: 5.2 mmol/L (ref 3.5–5.3)
Sodium: 139 mmol/L (ref 135–146)
Total Bilirubin: 0.3 mg/dL (ref 0.2–1.2)
Total Protein: 7 g/dL (ref 6.1–8.1)

## 2016-02-16 LAB — IRON AND TIBC
%SAT: 11 % (ref 11–50)
IRON: 39 ug/dL — AB (ref 45–160)
TIBC: 347 ug/dL (ref 250–450)
UIBC: 308 ug/dL (ref 125–400)

## 2016-02-16 LAB — LIPID PANEL
CHOL/HDL RATIO: 2.8 ratio (ref <=5.0)
CHOLESTEROL: 127 mg/dL (ref 125–200)
HDL: 45 mg/dL — AB (ref >=46)
LDL Cholesterol: 65 mg/dL (ref <130)
Triglycerides: 87 mg/dL (ref <150)
VLDL: 17 mg/dL (ref <30)

## 2016-02-17 LAB — HEMOGLOBIN A1C
HEMOGLOBIN A1C: 6.5 % — AB (ref <5.7)
MEAN PLASMA GLUCOSE: 140 mg/dL

## 2016-02-18 ENCOUNTER — Ambulatory Visit: Payer: Medicare HMO | Admitting: Gastroenterology

## 2016-02-21 ENCOUNTER — Other Ambulatory Visit: Payer: Self-pay | Admitting: Family Medicine

## 2016-02-22 NOTE — Telephone Encounter (Signed)
Refill appropriate and filled per protocol. 

## 2016-03-01 ENCOUNTER — Ambulatory Visit: Payer: Medicare HMO | Admitting: Endocrinology

## 2016-03-08 ENCOUNTER — Telehealth: Payer: Self-pay | Admitting: *Deleted

## 2016-03-08 MED ORDER — OXYCODONE-ACETAMINOPHEN 7.5-325 MG PO TABS: 1.0000 | ORAL_TABLET | Freq: Three times a day (TID) | ORAL | 0 refills | Status: DC | PRN

## 2016-03-08 NOTE — Telephone Encounter (Signed)
Okay to refill? 

## 2016-03-08 NOTE — Telephone Encounter (Signed)
Received call from patient.   Requested refill on Percocet.   Ok to refill??  Last office visit 12/31/2015.  Last refill 02/07/2016.

## 2016-03-08 NOTE — Telephone Encounter (Signed)
Prescription printed and patient made aware to come to office to pick up on 03/09/2016.

## 2016-03-16 ENCOUNTER — Telehealth: Payer: Self-pay | Admitting: *Deleted

## 2016-03-16 NOTE — Telephone Encounter (Signed)
Received call from patient.   Reports that she is having difficulty picking up prescription every month. Patient is given #100 tabs with SIG for TID PRN. This equals 33 day prescription, and patient is being made to wait for 33 days.   MD please advise.

## 2016-03-17 NOTE — Telephone Encounter (Signed)
Okay to change next script to say every 6 hours, but 100 tablets must last 30 days

## 2016-03-20 NOTE — Telephone Encounter (Signed)
Will note for next prescription.

## 2016-03-23 ENCOUNTER — Ambulatory Visit: Payer: Medicare HMO | Admitting: Orthopedic Surgery

## 2016-03-26 DIAGNOSIS — R69 Illness, unspecified: Secondary | ICD-10-CM | POA: Diagnosis not present

## 2016-03-30 ENCOUNTER — Telehealth: Payer: Self-pay | Admitting: *Deleted

## 2016-03-30 NOTE — Telephone Encounter (Signed)
Records indicate prescription refill appropriate for Percocet.  Ok to refill??  Last office visit 12/31/2015.  Last refill 03/08/2016.

## 2016-03-31 MED ORDER — OXYCODONE-ACETAMINOPHEN 7.5-325 MG PO TABS: 1.0000 | ORAL_TABLET | Freq: Three times a day (TID) | ORAL | 0 refills | Status: DC | PRN

## 2016-03-31 NOTE — Telephone Encounter (Signed)
Prescription printed.   Call placed to patient. No answer. No VM. 

## 2016-03-31 NOTE — Telephone Encounter (Signed)
Okay to refill? 

## 2016-04-05 ENCOUNTER — Telehealth: Payer: Self-pay | Admitting: *Deleted

## 2016-04-05 MED ORDER — OXYCODONE-ACETAMINOPHEN 7.5-325 MG PO TABS: 1.0000 | ORAL_TABLET | Freq: Four times a day (QID) | ORAL | 0 refills | Status: DC | PRN

## 2016-04-05 NOTE — Telephone Encounter (Signed)
Prescription printed as ordered.   Patient made aware.

## 2016-04-05 NOTE — Telephone Encounter (Signed)
Received call from patient.   Requested refill on Percocet, but with increased dosage (See note from MD on 03/16/2016).  Ok to refill?

## 2016-04-05 NOTE — Telephone Encounter (Signed)
Reviewed note by Dr. Buelah Manis from that date--- That note states that ok for next Rx to be changed to ---Every 6 hours--but  # 100 must last 30 days.  Approved. Can write Rx accordingly.

## 2016-04-11 ENCOUNTER — Ambulatory Visit: Payer: Medicare HMO | Admitting: "Endocrinology

## 2016-04-17 ENCOUNTER — Ambulatory Visit: Payer: Medicare HMO | Admitting: "Endocrinology

## 2016-04-24 ENCOUNTER — Other Ambulatory Visit: Payer: Self-pay | Admitting: Family Medicine

## 2016-04-24 DIAGNOSIS — R69 Illness, unspecified: Secondary | ICD-10-CM | POA: Diagnosis not present

## 2016-05-02 ENCOUNTER — Telehealth: Payer: Self-pay | Admitting: *Deleted

## 2016-05-02 MED ORDER — OXYCODONE-ACETAMINOPHEN 7.5-325 MG PO TABS: 1.0000 | ORAL_TABLET | Freq: Four times a day (QID) | ORAL | 0 refills | Status: DC | PRN

## 2016-05-02 NOTE — Telephone Encounter (Signed)
Okay to refill, pt needs OV

## 2016-05-02 NOTE — Telephone Encounter (Signed)
Received call from patient.  Requested refill on Percocet.   Ok to refill??  Last office visit 12/31/2015.  Last refill  04/05/2016.

## 2016-05-02 NOTE — Telephone Encounter (Signed)
Prescription printed and patient made aware to come to office to pick up on 05/03/2016 per VM.

## 2016-05-16 ENCOUNTER — Other Ambulatory Visit: Payer: Self-pay | Admitting: Family Medicine

## 2016-05-17 ENCOUNTER — Other Ambulatory Visit: Payer: Self-pay | Admitting: Family Medicine

## 2016-05-17 DIAGNOSIS — Z1231 Encounter for screening mammogram for malignant neoplasm of breast: Secondary | ICD-10-CM

## 2016-05-19 ENCOUNTER — Ambulatory Visit: Payer: Medicare HMO | Admitting: "Endocrinology

## 2016-05-22 ENCOUNTER — Ambulatory Visit (HOSPITAL_COMMUNITY): Payer: Medicare HMO

## 2016-05-28 ENCOUNTER — Other Ambulatory Visit: Payer: Self-pay | Admitting: Family Medicine

## 2016-05-29 ENCOUNTER — Ambulatory Visit (HOSPITAL_COMMUNITY): Payer: Medicare HMO

## 2016-05-31 ENCOUNTER — Ambulatory Visit: Payer: Medicare HMO | Admitting: Family Medicine

## 2016-06-02 ENCOUNTER — Ambulatory Visit: Payer: Medicare HMO | Admitting: Family Medicine

## 2016-06-02 ENCOUNTER — Encounter: Payer: Self-pay | Admitting: Family Medicine

## 2016-06-02 ENCOUNTER — Ambulatory Visit (INDEPENDENT_AMBULATORY_CARE_PROVIDER_SITE_OTHER): Payer: Medicare HMO | Admitting: Family Medicine

## 2016-06-02 VITALS — BP 132/68 | HR 74 | Temp 98.1°F | Resp 14 | Ht 63.0 in | Wt 205.0 lb

## 2016-06-02 DIAGNOSIS — IMO0001 Reserved for inherently not codable concepts without codable children: Secondary | ICD-10-CM

## 2016-06-02 DIAGNOSIS — E1141 Type 2 diabetes mellitus with diabetic mononeuropathy: Secondary | ICD-10-CM

## 2016-06-02 DIAGNOSIS — I10 Essential (primary) hypertension: Secondary | ICD-10-CM

## 2016-06-02 DIAGNOSIS — Z6836 Body mass index (BMI) 36.0-36.9, adult: Secondary | ICD-10-CM | POA: Diagnosis not present

## 2016-06-02 DIAGNOSIS — M5136 Other intervertebral disc degeneration, lumbar region: Secondary | ICD-10-CM | POA: Diagnosis not present

## 2016-06-02 DIAGNOSIS — E1140 Type 2 diabetes mellitus with diabetic neuropathy, unspecified: Secondary | ICD-10-CM

## 2016-06-02 DIAGNOSIS — E6609 Other obesity due to excess calories: Secondary | ICD-10-CM | POA: Diagnosis not present

## 2016-06-02 DIAGNOSIS — K580 Irritable bowel syndrome with diarrhea: Secondary | ICD-10-CM

## 2016-06-02 MED ORDER — ALLOPURINOL 300 MG PO TABS: 300.0000 mg | ORAL_TABLET | Freq: Every day | ORAL | 1 refills | Status: DC

## 2016-06-02 MED ORDER — INSULIN DEGLUDEC 100 UNIT/ML ~~LOC~~ SOPN: 10.0000 [IU] | PEN_INJECTOR | Freq: Every day | SUBCUTANEOUS | 0 refills | Status: DC

## 2016-06-02 MED ORDER — METFORMIN HCL ER 500 MG PO TB24: ORAL_TABLET | ORAL | 2 refills | Status: DC

## 2016-06-02 MED ORDER — OXYCODONE-ACETAMINOPHEN 7.5-325 MG PO TABS: 1.0000 | ORAL_TABLET | Freq: Four times a day (QID) | ORAL | 0 refills | Status: DC | PRN

## 2016-06-02 MED ORDER — AMLODIPINE BESYLATE 10 MG PO TABS: 10.0000 mg | ORAL_TABLET | Freq: Every day | ORAL | 1 refills | Status: DC

## 2016-06-02 MED ORDER — LOSARTAN POTASSIUM 100 MG PO TABS: 100.0000 mg | ORAL_TABLET | Freq: Every day | ORAL | 1 refills | Status: DC

## 2016-06-02 NOTE — Patient Instructions (Signed)
Release of records- My eye Doctor in [place]  We will call with lab results Take Tresiba 10 units at bedtime F/U 3 months

## 2016-06-02 NOTE — Progress Notes (Signed)
Subjective:    Patient ID: Stephanie Hammond, female    DOB: [birth year], 65 y.o.   MRN: [id number]  Patient presents for F/U (is not fasting); Roaring in Head (states that she has feeling of hummin gor roaring in head); and IBS D (was taking Viberzi, but states that she was told she could no longer take it since she doesn't have a gallbladder- states that she has frequent diarrhea after eating)   She had a follow-up chronic medical problems. Diabetes mellitus very labile CBGs last A1c was 6.5% showing good control she takes 10 units of insulin at bedtime, ran out of insulin and was not taking every night, no insulin for the past 3-4 days. Taking metformin out of this past few days , Her  Has appt with endocrine on the 9th for her diabetes, she rescheduled multiple times LDL was also at goal.  She has IBS she is followed by gastroenterology for this, had to stop Viberzi due to no gallbladder Unable take Bentyl    Notices a roaring sound for in her head , gets worse when blood sugar goes, up take advil which helps , She also has a Sensation in the palm of her right hand only she denies any numbness or tinnitus in the fingertips denies any pain into her shoulder or arm  Declines flu shot    Request refill on pain meds   Review Of Systems:  GEN- denies fatigue, fever, weight loss,weakness, recent illness HEENT- denies eye drainage, change in vision, nasal discharge, CVS- denies chest pain, palpitations RESP- denies SOB, cough, wheeze ABD- denies N/V, change in stools, abd pain GU- denies dysuria, hematuria, dribbling, incontinence MSK- denies joint pain, muscle aches, injury Neuro- denies headache, dizziness, syncope, seizure activity       Objective:    BP 132/68 (BP Location: Left Arm, Patient Position: Sitting, Cuff Size: Large)   Pulse 74   Temp 98.1 F (36.7 C) (Oral)   Resp 14   Ht 5\' 3"  (1.6 m)   Wt 205 lb (93 kg)   BMI 36.31 kg/m  GEN- NAD, alert and oriented  x3 HEENT- PERRL, EOMI, non injected sclera, pink conjunctiva, MMM, oropharynx clear,TM clear bilat  Neck- Supple, no thyromegaly CVS- RRR, no murmur RESP-CTAB ABD-NABS,soft,NT,ND Neuro- CNII-XII intact, neg phalens, neg tinels, decreased feeling palm of right hand only EXT- No edema Pulses- Radial, DP- 2+        Assessment & Plan:      Problem List Items Addressed This Visit    Type 2 diabetes mellitus with diabetic neuropathy (HCC) - Primary    Diabetes with no neuropathy she is a very labile diabetic at bedtime to get her in with endocrinology as her blood sugars were running 2-300 per her meter. Her A1c will look fine. She is now out of insulin which I think is causing her blood sugars to run up significantly. I given her Evaristo Bury 10 units to take in the evening a sample was provided. Until she is able to get her regular insulin. She still also on the metformin. Recheck labs today. His history or neuropathy in her feet which she has not had any complaints about recently very odd numbness only in the palm of her hand there is no rash there is no sign of any cervical disease possible carpal tunnel or neuropathy associated with her diabetes      Relevant Medications   insulin degludec (TRESIBA FLEXTOUCH) 100 UNIT/ML SOPN FlexTouch Pen   Other  Relevant Orders   Comprehensive metabolic panel   CBC with Differential/Platelet   Hemoglobin A1c   Obesity   Relevant Medications   insulin degludec (TRESIBA FLEXTOUCH) 100 UNIT/ML SOPN FlexTouch Pen   IBS (irritable bowel syndrome)    She is exhausted most of her options. Advised her to follow with gastroenterology at this time she is just taking Pepto-Bismol over-the-counter as needed      Essential hypertension, benign    Controlled, no change to meds       Diabetic neuropathy (HCC)   Relevant Medications   insulin degludec (TRESIBA FLEXTOUCH) 100 UNIT/ML SOPN FlexTouch Pen    Other Visit Diagnoses   None.     Note: This  dictation was prepared with Dragon dictation along with smaller phrase technology. Any transcriptional errors that result from this process are unintentional.

## 2016-06-02 NOTE — Assessment & Plan Note (Signed)
She is exhausted most of her options. Advised her to follow with gastroenterology at this time she is just taking Pepto-Bismol over-the-counter as needed

## 2016-06-02 NOTE — Assessment & Plan Note (Addendum)
Diabetes with no neuropathy she is a very labile diabetic at bedtime to get her in with endocrinology as her blood sugars were running 2-300 per her meter. Her A1c will look fine. She is now out of insulin which I think is causing her blood sugars to run up significantly. I given her Tyler Aas 10 units to take in the evening a sample was provided. Until she is able to get her regular insulin. She still also on the metformin. Recheck labs today. His history or neuropathy in her feet which she has not had any complaints about recently very odd numbness only in the palm of her hand there is no rash there is no sign of any cervical disease possible carpal tunnel or neuropathy associated with her diabetes

## 2016-06-02 NOTE — Assessment & Plan Note (Signed)
Pain medication refilled

## 2016-06-02 NOTE — Assessment & Plan Note (Signed)
Controlled, no change to meds 

## 2016-06-03 LAB — CBC WITH DIFFERENTIAL/PLATELET
Basophils Absolute: 77 cells/uL (ref 0–200)
Basophils Relative: 1 %
Eosinophils Absolute: 154 cells/uL (ref 15–500)
Eosinophils Relative: 2 %
HEMATOCRIT: 36.5 % (ref 35.0–45.0)
HEMOGLOBIN: 12.2 g/dL (ref 12.0–15.0)
LYMPHS ABS: 2156 {cells}/uL (ref 850–3900)
Lymphocytes Relative: 28 %
MCH: 31.3 pg (ref 27.0–33.0)
MCHC: 33.4 g/dL (ref 32.0–36.0)
MCV: 93.6 fL (ref 80.0–100.0)
MONO ABS: 462 {cells}/uL (ref 200–950)
MPV: 10.1 fL (ref 7.5–12.5)
Monocytes Relative: 6 %
NEUTROS ABS: 4851 {cells}/uL (ref 1500–7800)
NEUTROS PCT: 63 %
Platelets: 389 10*3/uL (ref 140–400)
RBC: 3.9 MIL/uL (ref 3.80–5.10)
RDW: 13.7 % (ref 11.0–15.0)
WBC: 7.7 10*3/uL (ref 3.8–10.8)

## 2016-06-03 LAB — COMPREHENSIVE METABOLIC PANEL
ALT: 13 U/L (ref 6–29)
AST: 15 U/L (ref 10–35)
Albumin: 4 g/dL (ref 3.6–5.1)
Alkaline Phosphatase: 103 U/L (ref 33–130)
BUN: 16 mg/dL (ref 7–25)
CHLORIDE: 106 mmol/L (ref 98–110)
CO2: 25 mmol/L (ref 20–31)
Calcium: 9.5 mg/dL (ref 8.6–10.4)
Creat: 0.78 mg/dL (ref 0.50–0.99)
Glucose, Bld: 177 mg/dL — ABNORMAL HIGH (ref 70–99)
POTASSIUM: 5.2 mmol/L (ref 3.5–5.3)
Sodium: 141 mmol/L (ref 135–146)
TOTAL PROTEIN: 6.9 g/dL (ref 6.1–8.1)
Total Bilirubin: 0.3 mg/dL (ref 0.2–1.2)

## 2016-06-03 LAB — HEMOGLOBIN A1C
Hgb A1c MFr Bld: 5.8 % — ABNORMAL HIGH (ref <5.7)
MEAN PLASMA GLUCOSE: 120 mg/dL

## 2016-06-12 ENCOUNTER — Ambulatory Visit (INDEPENDENT_AMBULATORY_CARE_PROVIDER_SITE_OTHER): Payer: Medicare HMO | Admitting: "Endocrinology

## 2016-06-12 ENCOUNTER — Encounter: Payer: Self-pay | Admitting: "Endocrinology

## 2016-06-12 VITALS — BP 134/76 | HR 74 | Ht 63.0 in | Wt 206.0 lb

## 2016-06-12 DIAGNOSIS — Z720 Tobacco use: Secondary | ICD-10-CM | POA: Diagnosis not present

## 2016-06-12 DIAGNOSIS — E1140 Type 2 diabetes mellitus with diabetic neuropathy, unspecified: Secondary | ICD-10-CM | POA: Diagnosis not present

## 2016-06-12 DIAGNOSIS — Z794 Long term (current) use of insulin: Secondary | ICD-10-CM | POA: Diagnosis not present

## 2016-06-12 DIAGNOSIS — E782 Mixed hyperlipidemia: Secondary | ICD-10-CM | POA: Diagnosis not present

## 2016-06-12 MED ORDER — INSULIN DEGLUDEC 100 UNIT/ML ~~LOC~~ SOPN: 15.0000 [IU] | PEN_INJECTOR | Freq: Every day | SUBCUTANEOUS | 2 refills | Status: DC

## 2016-06-12 NOTE — Patient Instructions (Signed)

## 2016-06-12 NOTE — Progress Notes (Signed)
Subjective:    Patient ID: Stephanie Hammond, female    DOB: May 26, 1951. Patient is being seen in consultation for management of diabetes requested by  Milinda Antis, MD  Past Medical History:  Diagnosis Date  . Anemia   . Blood transfusion 2009   after GI bleed  . CAD (coronary artery disease)   . Diabetes mellitus   . Diverticulitis   . Diverticulosis   . GERD (gastroesophageal reflux disease)   . Gout   . History of GI diverticular bleed 08/2004  . Hypertension   . IBS (irritable bowel syndrome)   . Osteoarthritis    Past Surgical History:  Procedure Laterality Date  . CHOLECYSTECTOMY  2003  . COLONOSCOPY  2000   Dr. Russella Dar: marked diverticulosis, difficult procedure due to adhesions, polyps benign  . COLONOSCOPY  2006   Dr. Marina Goodell: severe pandiverticulosis  . COLONOSCOPY N/A 02/03/2013   SLF: 4 COLORECTAL POLYPS REMOVED/Moderate diverticulosis throughout the entire examined colon/Small internal hemorrhoids  . JOINT REPLACEMENT Right 2009   Knee Replacement Right   . PTCA    . REPLACEMENT TOTAL KNEE     right   . TOTAL KNEE ARTHROPLASTY Left 03/12/2015   Procedure: LEFT TOTAL KNEE REPLACEMENT;  Surgeon: Vickki Hearing, MD;  Location: [facility];  Service: Orthopedics;  Laterality: Left;  [name] VAGINAL HYSTERECTOMY     Social History   Social History  . Marital status: Married    Spouse name: N/A  . Number of children: N/A  . Years of education: N/A   Social History Main Topics  . Smoking status: Current Every Day Smoker    Packs/day: 1.00    Years: 35.00    Types: Cigarettes  . Smokeless tobacco: Never Used  . Alcohol use No  . Drug use: No  . Sexual activity: No   Other Topics Concern  . None   Social History Narrative  . None   Outpatient Encounter Prescriptions as of 06/12/2016  Medication Sig  . allopurinol (ZYLOPRIM) 300 MG tablet Take 1 tablet (300 mg total) by mouth daily.  [name] amLODipine (NORVASC) 10 MG tablet Take 1 tablet (10 mg total) by mouth  daily.  . insulin degludec (TRESIBA FLEXTOUCH) 100 UNIT/ML SOPN FlexTouch Pen Inject 0.15 mLs (15 Units total) into the skin daily at 10 pm.  . Insulin Pen Needle (PEN NEEDLES) 31G X 8 MM MISC 1 Package by Does not apply route daily.  [name] losartan (COZAAR) 100 MG tablet Take 1 tablet (100 mg total) by mouth daily.  . metFORMIN (GLUCOPHAGE-XR) 500 MG 24 hr tablet TAKE 2 TABLETS(1000 MG) BY MOUTH DAILY WITH BREAKFAST  . omeprazole (PRILOSEC) 20 MG capsule TAKE 1 CAPSULE BY MOUTH EVERY MORNING  . ONE TOUCH ULTRA TEST test strip USE AS DIRECTED TO TEST THREE TIMES DAILY  . oxyCODONE-acetaminophen (PERCOCET) 7.5-325 MG tablet Take 1 tablet by mouth every 6 (six) hours as needed for severe pain.  . simvastatin (ZOCOR) 10 MG tablet TAKE 1 TABLET BY MOUTH EVERY MORNING  . [DISCONTINUED] insulin degludec (TRESIBA FLEXTOUCH) 100 UNIT/ML SOPN FlexTouch Pen Inject 0.1 mLs (10 Units total) into the skin daily at 10 pm.  . [DISCONTINUED] Insulin Detemir (LEVEMIR) 100 UNIT/ML Pen Injected 10-50 units as directed at bedtime (Patient not taking: Reported on 06/02/2016)   No facility-administered encounter medications on file as of 06/12/2016.    ALLERGIES: Allergies  Allergen Reactions  . Ace Inhibitors Cough  . Bentyl [Dicyclomine Hcl] Other (See Comments)  Blurry vision  . Penicillins Itching and Swelling   VACCINATION STATUS:  There is no immunization history on file for this patient.  Diabetes  She presents for her initial diabetic visit. She has type 2 diabetes mellitus. Onset time: She was diagnosed at approximate age of 54 years. Her disease course has been worsening. There are no hypoglycemic associated symptoms. Pertinent negatives for hypoglycemia include no confusion, headaches, pallor or seizures. Associated symptoms include fatigue, polydipsia and polyuria. Pertinent negatives for diabetes include no chest pain and no polyphagia. There are no hypoglycemic complications. Symptoms are worsening.  Diabetic complications include peripheral neuropathy. Risk factors for coronary artery disease include diabetes mellitus, dyslipidemia, obesity, sedentary lifestyle and tobacco exposure. Current diabetic treatments: She is on metformin extended release 500 mg by mouth daily and basal insulin Tresiba 10 units daily at bedtime. Her weight is increasing steadily. She is following a generally unhealthy diet. When asked about meal planning, she reported none. She has not had a previous visit with a dietitian. She never participates in exercise. Home blood sugar record trend: She brought a meter showing some rare random monitoring averaging more than 200 mg/dL. Her overall blood glucose range is >200 mg/dl. An ACE inhibitor/angiotensin II receptor blocker is being taken. Eye exam is current.  Hyperlipidemia  This is a chronic problem. The current episode started more than 1 year ago. The problem is controlled. Exacerbating diseases include diabetes and obesity. Factors aggravating her hyperlipidemia include smoking. Pertinent negatives include no chest pain or myalgias. Current antihyperlipidemic treatment includes statins. Risk factors for coronary artery disease include diabetes mellitus, dyslipidemia, hypertension, a sedentary lifestyle and obesity.  Hypertension  This is a chronic problem. The current episode started more than 1 year ago. Pertinent negatives include no chest pain, headaches or palpitations. Risk factors for coronary artery disease include smoking/tobacco exposure, sedentary lifestyle, obesity, dyslipidemia and diabetes mellitus. Past treatments include angiotensin blockers.       Review of Systems  Constitutional: Positive for fatigue. Negative for chills, fever and unexpected weight change.  HENT: Negative for trouble swallowing and voice change.   Eyes: Negative for visual disturbance.  Respiratory: Positive for cough. Negative for wheezing.   Cardiovascular: Negative for chest pain,  palpitations and leg swelling.  Gastrointestinal: Negative for diarrhea, nausea and vomiting.  Endocrine: Positive for polydipsia and polyuria. Negative for cold intolerance, heat intolerance and polyphagia.  Musculoskeletal: Negative for arthralgias and myalgias.  Skin: Negative for color change, pallor, rash and wound.  Neurological: Negative for seizures and headaches.  Psychiatric/Behavioral: Negative for confusion and suicidal ideas.    Objective:    BP 134/76   Pulse 74   Ht 5\' 3"  (1.6 m)   Wt 206 lb (93.4 kg)   BMI 36.49 kg/m   Wt Readings from Last 3 Encounters:  06/12/16 206 lb (93.4 kg)  06/02/16 205 lb (93 kg)  12/31/15 198 lb (89.8 kg)    Physical Exam  Constitutional: She is oriented to person, place, and time. She appears well-developed.  HENT:  Head: Normocephalic and atraumatic.  Eyes: EOM are normal.  Neck: Normal range of motion. Neck supple. No tracheal deviation present. No thyromegaly present.  Cardiovascular: Regular rhythm.  Exam reveals no gallop and no friction rub.   No murmur heard. Distant heart sounds.  Pulmonary/Chest: Effort normal and breath sounds normal.  Abdominal: Soft. Bowel sounds are normal. There is no tenderness. There is no guarding.  Musculoskeletal: Normal range of motion. She exhibits no edema.  Neurological:  She is alert and oriented to person, place, and time. She has normal reflexes. No cranial nerve deficit. Coordination normal.  Skin: Skin is warm and dry. No rash noted. No erythema. No pallor.  Psychiatric: She has a normal mood and affect. Judgment normal.    CMP     Component Value Date/Time   NA 141 06/02/2016 1436   K 5.2 06/02/2016 1436   CL 106 06/02/2016 1436   CO2 25 06/02/2016 1436   GLUCOSE 177 (H) 06/02/2016 1436   GLUCOSE 349 (H) 10/06/2015 1436   BUN 16 06/02/2016 1436   CREATININE 0.78 06/02/2016 1436   CALCIUM 9.5 06/02/2016 1436   PROT 6.9 06/02/2016 1436   ALBUMIN 4.0 06/02/2016 1436   AST 15  06/02/2016 1436   ALT 13 06/02/2016 1436   ALKPHOS 103 06/02/2016 1436   BILITOT 0.3 06/02/2016 1436   GFRNONAA >60 09/26/2015 0900   GFRAA >60 09/26/2015 0900   Diabetic Labs (most recent): Lab Results  Component Value Date   HGBA1C 5.8 (H) 06/02/2016   HGBA1C 6.5 (H) 02/14/2016   HGBA1C 8.1 (H) 10/06/2015     Lipid Panel ( most recent) Lipid Panel     Component Value Date/Time   CHOL 127 02/14/2016 0837   TRIG 87 02/14/2016 0837   HDL 45 (L) 02/14/2016 0837   CHOLHDL 2.8 02/14/2016 0837   VLDL 17 02/14/2016 0837   LDLCALC 65 02/14/2016 0837   LDLDIRECT 68 12/01/2013 1454     Assessment & Plan:   1. Type 2 diabetes mellitus with diabetic neuropathy, with long-term current use of insulin (HCC)  - Patient has currently uncontrolled symptomatic type 2 DM since  65 years of age,  with most recent A1c of 5.8 %, however this does not agree with her most recent glucose profile averaging greater than 200 mg/dL in her meter. Recent labs reviewed.   Her diabetes is complicated by chronic heavy smoking, obesity/secondary life, peripheral neuropathy and patient remains at a high risk for more acute and chronic complications of diabetes which include CAD, CVA, CKD, retinopathy, and neuropathy. These are all discussed in detail with the patient.  - I have counseled the patient on diet management and weight loss, by adopting a carbohydrate restricted/protein rich diet.  - Suggestion is made for patient to avoid simple carbohydrates   from their diet including Cakes , Desserts, Ice Cream,  Soda (  diet and regular) , Sweet Tea , Candies,  Chips, Cookies, Artificial Sweeteners,   and "Sugar-free" Products . This will help patient to have stable blood glucose profile and potentially avoid unintended weight gain.  - I encouraged the patient to switch to  unprocessed or minimally processed complex starch and increased protein intake (animal or plant source), fruits, and vegetables.  - Patient  is advised to stick to a routine mealtimes to eat 3 meals  a day and avoid unnecessary snacks ( to snack only to correct hypoglycemia).  - The patient will be scheduled with Norm Salt, RDN, CDE for individualized DM education.  - I have approached patient with the following individualized plan to manage diabetes and patient agrees:   - I  will proceed to initiate  strict monitoring of glucose  AC and HS. - In the meantime I advised her to increase her basal insulin Tresiba to 15 units daily at bedtime.  -Patient is encouraged to call clinic for blood glucose levels less than 70 or above 300 mg /dl. - If she returns with  significant postprandial hyperglycemia, she will be considered for bolus insulin.  - I will  increase and continue metformin  to 1000 mg ER by mouth daily, therapeutically suitable for patient.. - Patient will be considered for incretin therapy as appropriate next visit. - Patient specific target  A1c;  LDL, HDL, Triglycerides, and  Waist Circumference were discussed in detail.  2) BP/HTN: Controlled. Continue current medications including ACEI/ARB. 3) Lipids/HPL:  Controlled, continue statins. 4)  Weight/Diet: CDE Consult will be initiated , exercise, and detailed carbohydrates information provided.  5) Chronic Care/Health Maintenance:  -Patient is on ACEI/ARB and Statin medications and encouraged to continue to follow up with Ophthalmology, Podiatrist at least yearly or according to recommendations, and advised to  Quit smoking. I have recommended yearly flu vaccine and pneumonia vaccination at least every 5 years; moderate intensity exercise for up to 150 minutes weekly; and  sleep for at least 7 hours a day.  - 60 minutes of time was spent on the care of this patient , 50% of which was applied for counseling on diabetes complications and their preventions.  - Patient to bring meter and  blood glucose logs during their next visit.   - I advised patient to maintain  close follow up with Milinda Antis, MD for primary care needs.  Follow up plan: - Return in about 1 week (around 06/19/2016) for follow up with meter and logs- no labs.  Marquis Lunch, MD Phone: [phone]  Fax: [phone]   06/12/2016, 10:01 AM

## 2016-06-15 ENCOUNTER — Ambulatory Visit: Payer: Medicare HMO | Admitting: "Endocrinology

## 2016-06-19 ENCOUNTER — Telehealth: Payer: Self-pay | Admitting: Family Medicine

## 2016-06-19 NOTE — Telephone Encounter (Signed)
Samples are in refrigerator for patient to pick up.

## 2016-06-19 NOTE — Telephone Encounter (Signed)
Patient would like samples of triseba if any please call  [phone]

## 2016-06-20 ENCOUNTER — Ambulatory Visit: Payer: Medicare HMO | Admitting: Family Medicine

## 2016-06-20 ENCOUNTER — Other Ambulatory Visit: Payer: Self-pay | Admitting: Family Medicine

## 2016-06-20 ENCOUNTER — Ambulatory Visit (INDEPENDENT_AMBULATORY_CARE_PROVIDER_SITE_OTHER): Payer: Medicare HMO | Admitting: Family Medicine

## 2016-06-20 ENCOUNTER — Encounter: Payer: Self-pay | Admitting: Family Medicine

## 2016-06-20 VITALS — BP 130/72 | HR 78 | Temp 98.3°F | Resp 14 | Ht 63.0 in | Wt 201.0 lb

## 2016-06-20 DIAGNOSIS — M25512 Pain in left shoulder: Secondary | ICD-10-CM

## 2016-06-20 DIAGNOSIS — M7522 Bicipital tendinitis, left shoulder: Secondary | ICD-10-CM | POA: Diagnosis not present

## 2016-06-20 DIAGNOSIS — N644 Mastodynia: Secondary | ICD-10-CM | POA: Diagnosis not present

## 2016-06-20 DIAGNOSIS — M5136 Other intervertebral disc degeneration, lumbar region: Secondary | ICD-10-CM | POA: Diagnosis not present

## 2016-06-20 MED ORDER — OXYCODONE-ACETAMINOPHEN 7.5-325 MG PO TABS: 1.0000 | ORAL_TABLET | Freq: Four times a day (QID) | ORAL | 0 refills | Status: DC | PRN

## 2016-06-20 MED ORDER — MELOXICAM 7.5 MG PO TABS: 7.5000 mg | ORAL_TABLET | Freq: Every day | ORAL | 0 refills | Status: DC

## 2016-06-20 NOTE — Patient Instructions (Addendum)
Diagnostic mammogram of left breast  Take mobic with food Continue all other medications Referral to orthopedics  F/U as previous

## 2016-06-20 NOTE — Progress Notes (Signed)
Subjective:    Patient ID: Stephanie Hammond, female    DOB: [birth year], 65 y.o.   MRN: [id number]  Patient presents for Leg Pain (R leg- states that she has increased pain in upper leg); L Arm Pain (L upper arm below shoulder pain- reports that she is having issues moving arm- decreased ROM); and L Axilla Pain (states that she has tenderness to L axilla)  Patient here with left shoulder pain worsening over the past couple months. She does not remember a particular injury but now cannot lift her arm above shoulder height cannot reach backwards to put on her bra without significant pain she points to the middle of her biceps. She's been taking her pain medicine which helps.  She felt some tenderness in her low left breast mostly near the axilla she wanted to go ahead and get a mammogram she does not feel any specific mass. This has been going on for the past week  Get some pain in her lower back as well as her right buttocks especially if she sits too long. She has known spine disease. Which is why she is on chronic pain medicine. She denies any tingling numbness in the in her feet or legs. No change in bowel or bladder   Review Of Systems:  GEN- denies fatigue, fever, weight loss,weakness, recent illness HEENT- denies eye drainage, change in vision, nasal discharge, CVS- denies chest pain, palpitations RESP- denies SOB, cough, wheeze ABD- denies N/V, change in stools, abd pain GU- denies dysuria, hematuria, dribbling, incontinence MSK- + joint pain, muscle aches, injury Neuro- denies headache, dizziness, syncope, seizure activity       Objective:    BP 130/72 (BP Location: Right Arm, Patient Position: Sitting, Cuff Size: Large)   Pulse 78   Temp 98.3 F (36.8 C) (Oral)   Resp 14   Ht 5\' 3"  (1.6 m)   Wt 201 lb (91.2 kg)   SpO2 97%   BMI 35.61 kg/m  GEN- NAD, alert and oriented x3 HEENT- PERRL, EOMI, non injected sclera, pink conjunctiva, MMM, oropharynx clear Neck- Supple, no  thyromegaly CVS- RRR, no murmur RESP-CTAB Breast- normal symmetry, no nipple inversion,no nipple drainage, no nodules or lumps felt,TTP at 2 oc lock region Nodes- no axillary nodes MSK- Bilat upper ext, normal appearance, decreased ROM Left upper ext, TTP over mid biceps, pain with flexion of biceps, + empty can, able to raise to shoulder height before severe pain, unable to reach behind without severe pain Spine mild TTP lumbar spine, Neg SLR mild TTP right buttocks           Assessment & Plan:      Problem List Items Addressed This Visit    DDD (degenerative disc disease), lumbar    Known DDD, no red flags, continue pain medication       Relevant Medications   meloxicam (MOBIC) 7.5 MG tablet   oxyCODONE-acetaminophen (PERCOCET) 7.5-325 MG tablet    Other Visit Diagnoses    Breast pain, left    -  Primary   Diagnostic mammogram to be done, she has large bust    Relevant Orders   MM Digital Diagnostic Bilat   Acute pain of left shoulder       Relevant Orders   Ambulatory referral to Orthopedic Surgery   Biceps tendinitis of left upper extremity       pain mostly with biceps ? tendinitis, start mobic, refer to her orthopedic, no specific injury.   Relevant Orders  Ambulatory referral to Orthopedic Surgery      Note: This dictation was prepared with Dragon dictation along with smaller phrase technology. Any transcriptional errors that result from this process are unintentional.

## 2016-06-21 ENCOUNTER — Other Ambulatory Visit: Payer: Self-pay | Admitting: Family Medicine

## 2016-06-21 ENCOUNTER — Encounter: Payer: Self-pay | Admitting: Family Medicine

## 2016-06-21 DIAGNOSIS — N644 Mastodynia: Secondary | ICD-10-CM

## 2016-06-21 DIAGNOSIS — N62 Hypertrophy of breast: Secondary | ICD-10-CM

## 2016-06-21 NOTE — Assessment & Plan Note (Signed)
Known DDD, no red flags, continue pain medication

## 2016-06-23 ENCOUNTER — Ambulatory Visit: Payer: Medicare HMO | Admitting: "Endocrinology

## 2016-06-26 ENCOUNTER — Ambulatory Visit: Payer: Medicare HMO | Admitting: Family Medicine

## 2016-07-04 ENCOUNTER — Ambulatory Visit (HOSPITAL_COMMUNITY)
Admission: RE | Admit: 2016-07-04 | Discharge: 2016-07-04 | Disposition: A | Discharge status: Home / Self Care | Payer: Medicare HMO | Source: Ambulatory Visit | Attending: Family Medicine | Admitting: Family Medicine

## 2016-07-04 DIAGNOSIS — N62 Hypertrophy of breast: Secondary | ICD-10-CM | POA: Diagnosis not present

## 2016-07-04 DIAGNOSIS — N644 Mastodynia: Secondary | ICD-10-CM | POA: Insufficient documentation

## 2016-07-07 ENCOUNTER — Ambulatory Visit: Payer: Medicare HMO | Admitting: "Endocrinology

## 2016-07-14 ENCOUNTER — Ambulatory Visit: Payer: Medicare HMO | Admitting: Orthopedic Surgery

## 2016-07-16 DIAGNOSIS — R69 Illness, unspecified: Secondary | ICD-10-CM | POA: Diagnosis not present

## 2016-07-17 ENCOUNTER — Ambulatory Visit: Payer: Medicare HMO | Admitting: "Endocrinology

## 2016-07-21 ENCOUNTER — Ambulatory Visit: Payer: Medicare HMO | Admitting: Orthopedic Surgery

## 2016-07-24 ENCOUNTER — Encounter: Payer: Medicare HMO | Attending: "Endocrinology | Admitting: Nutrition

## 2016-07-24 ENCOUNTER — Ambulatory Visit (INDEPENDENT_AMBULATORY_CARE_PROVIDER_SITE_OTHER): Payer: Medicare HMO

## 2016-07-24 ENCOUNTER — Encounter: Payer: Self-pay | Admitting: Orthopedic Surgery

## 2016-07-24 ENCOUNTER — Telehealth: Payer: Self-pay | Admitting: *Deleted

## 2016-07-24 ENCOUNTER — Ambulatory Visit (INDEPENDENT_AMBULATORY_CARE_PROVIDER_SITE_OTHER): Payer: Medicare HMO | Admitting: Orthopedic Surgery

## 2016-07-24 VITALS — BP 140/87 | HR 99 | Wt 207.0 lb

## 2016-07-24 VITALS — Ht 63.5 in | Wt 209.0 lb

## 2016-07-24 DIAGNOSIS — Z794 Long term (current) use of insulin: Secondary | ICD-10-CM

## 2016-07-24 DIAGNOSIS — M25512 Pain in left shoulder: Secondary | ICD-10-CM

## 2016-07-24 DIAGNOSIS — M75102 Unspecified rotator cuff tear or rupture of left shoulder, not specified as traumatic: Secondary | ICD-10-CM | POA: Diagnosis not present

## 2016-07-24 DIAGNOSIS — M5441 Lumbago with sciatica, right side: Secondary | ICD-10-CM

## 2016-07-24 DIAGNOSIS — E1140 Type 2 diabetes mellitus with diabetic neuropathy, unspecified: Secondary | ICD-10-CM | POA: Insufficient documentation

## 2016-07-24 DIAGNOSIS — E669 Obesity, unspecified: Secondary | ICD-10-CM

## 2016-07-24 DIAGNOSIS — Z713 Dietary counseling and surveillance: Secondary | ICD-10-CM | POA: Diagnosis not present

## 2016-07-24 MED ORDER — OXYCODONE-ACETAMINOPHEN 7.5-325 MG PO TABS: 1.0000 | ORAL_TABLET | Freq: Four times a day (QID) | ORAL | 0 refills | Status: DC | PRN

## 2016-07-24 NOTE — Telephone Encounter (Signed)
Prescription printed and patient made aware to come to office to pick up on 07/25/2016.

## 2016-07-24 NOTE — Telephone Encounter (Signed)
Okay to refill? 

## 2016-07-24 NOTE — Telephone Encounter (Signed)
Records indicate prescription refill appropriate for Percocet.  Ok to refill??  Last office visit/ refill 06/20/2016.

## 2016-07-24 NOTE — Progress Notes (Signed)
  Medical Nutrition Therapy:  Appt start time: 0800 end time:  0900.   Assessment:  Primary concerns today:  Diabetes Type 2. Lives with husband. She does the cooking and shopping. Her husband has had heart attack and so she does't fry much.  Tresiba 15 units a day. BS are up and down. She has been out of [place] for a few days now. Copay is too much before the new year for deductible. Missed appt to followup with Dr. Dorris Fetch and needs to reschedule appt.  Based on blood sugars, Dr. Dorris Fetch reviewed labs and advised to increase Tresiba to 20 units a day instead of 15 units. She verbalized understanding. Downloaded meter. Avg 194 mg/dl. Takes 15 units of Tresiba and Metformin 1000 mg at breakfast daily. She eats inconsistently. Usually skips breakfast or lunch and eats large supper and snacks between meals. Craves sweets and salty foods at times. Drinks some water. BMI 35.  She is not exercising. 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 Diet is Excessive to meet her needs causing high blood sugars and obesity.  Most recent A1C doesn't reflect her blood sugar readings.   Lab Results  Component Value Date   HGBA1C 5.8 (H) 06/02/2016    Preferred Learning Style:   No preference indicated   Learning Readiness:  Ready  Change in progress   MEDICATIONS:    DIETARY INTAKE:  24-hr recall:  B ( AM): Boiled egg and coffee Snk ( AM):   L ( PM): skipped OR Apple Snk ( PM):  D ( PM): Potato salad, deviled egg, chicken casserole, Lemonade or coffee Snk ( PM): Beverages: Lemonade or coffee  Usual physical activity:  ADL   Estimated energy needs: 1500  calories 170 g carbohydrates 112 g protein 40 g fat  Progress Towards Goal(s):  In progress.   Nutritional Diagnosis:  NB-1.1 Food and nutrition-related knowledge deficit As related to Diabetes and obesity.  As evidenced by Avg BS 194 and BMI>30.[name]    Intervention:  Nutrition and Diabetes education provided on My Plate, CHO counting, meal  planning, portion sizes, timing of meals, avoiding snacks between meals unless having a low blood sugar, target ranges for A1C and blood sugars, signs/symptoms and treatment of hyper/hypoglycemia, monitoring blood sugars, taking medications as prescribed, benefits of exercising 30 minutes per day and prevention of complications of DM.  Goals 1 Follow Plate Method 2. Increase fresh fruits and vegetables. 3. Drink only water and cut out lemonade and other sugar beverages. 4. Increase Tresiba to 20 units a day instead of 15 units per Dr. Dorris Fetch. 5. Exercise 15 mintues a day or more 6. Cut out snacks 7. Do not skip meals-- eat meas on time as discussed. 8. Take meds as prescribed. 9.  Test blood sugars as instructed twice a day and bring meter and BS log at next visit.  Teaching Method Utilized:  Visual Auditory Hands on  Handouts given during visit include:  The Plate Method   Meal Plan Card  Diabetes Instructions.   Barriers to learning/adherence to lifestyle change: none  Demonstrated degree of understanding via:  Teach Back   Monitoring/Evaluation:  Dietary intake, exercise, meal planning,, and body weight in 1 month(s). Sample 1- Stephanie Hammond pen was given to cover for the next 2 weeks til her insurance will cover starting in January 2018.

## 2016-07-24 NOTE — Patient Instructions (Addendum)
Goals 1 Follow Plate Method 2. Increase fresh fruits and vegetables. 3. Drink only water and cut out lemonade and other sugar beverages. 4. Increase Tresiba to 20 units a day instead of 15 units per Dr. Dorris Fetch. 5. Exercise 15 mintues a day or more 6. Cut out snacks 7. Do not skip meals-- eat meas on time as discussed. 8. Take meds as prescribed. 9.  Test blood sugars as instructed twice a day and bring meter and BS log at next visit.

## 2016-07-24 NOTE — Progress Notes (Signed)
Patient ID: Stephanie Hammond, female   DOB: May 16, 1951, 65 y.o.   MRN: [id number]  Chief Complaint  Patient presents with  . Shoulder Pain    LEFT SHOULDER PAIN    HPI Stephanie Hammond is a 65 y.o. female.  One month left shoulder pain no injury Painful forward elevation left shoulder, mild to moderate pain located over the lateral deltoid.  Review of Systems Review of Systems  Constitutional: Negative for fever.  Musculoskeletal: Positive for neck pain.  Neurological: Negative for weakness and numbness.    Past Medical History:  Diagnosis Date  . Anemia   . Blood transfusion 2009   after GI bleed  . CAD (coronary artery disease)   . Diabetes mellitus   . Diverticulitis   . Diverticulosis   . GERD (gastroesophageal reflux disease)   . Gout   . History of GI diverticular bleed 08/2004  . Hypertension   . IBS (irritable bowel syndrome)   . Osteoarthritis     Past Surgical History:  Procedure Laterality Date  . CHOLECYSTECTOMY  2003  . COLONOSCOPY  2000   Dr. Russella Dar: marked diverticulosis, difficult procedure due to adhesions, polyps benign  . COLONOSCOPY  2006   Dr. Marina Goodell: severe pandiverticulosis  . COLONOSCOPY N/A 02/03/2013   SLF: 4 COLORECTAL POLYPS REMOVED/Moderate diverticulosis throughout the entire examined colon/Small internal hemorrhoids  . JOINT REPLACEMENT Right 2009   Knee Replacement Right   . PTCA    . REPLACEMENT TOTAL KNEE     right   . TOTAL KNEE ARTHROPLASTY Left 03/12/2015   Procedure: LEFT TOTAL KNEE REPLACEMENT;  Surgeon: Vickki Hearing, MD;  Location: [facility];  Service: Orthopedics;  Laterality: Left;  [name] VAGINAL HYSTERECTOMY      Family History  Problem Relation Age of Onset  . Colon polyps Sister   . Cancer Mother     lung   . Cancer Father     stomach   . Stomach cancer Father     questionable  . Colon cancer Neg Hx     Social History Social History  Substance Use Topics  . Smoking status: Current Every Day Smoker   Packs/day: 1.00    Years: 35.00    Types: Cigarettes  . Smokeless tobacco: Never Used  . Alcohol use No    Allergies  Allergen Reactions  . Ace Inhibitors Cough  . Bentyl [Dicyclomine Hcl] Other (See Comments)    Blurry vision  . Penicillins Itching and Swelling    Current Outpatient Prescriptions  Medication Sig Dispense Refill  . allopurinol (ZYLOPRIM) 300 MG tablet Take 1 tablet (300 mg total) by mouth daily. 90 tablet 1  . amLODipine (NORVASC) 10 MG tablet Take 1 tablet (10 mg total) by mouth daily. 90 tablet 1  . insulin degludec (TRESIBA FLEXTOUCH) 100 UNIT/ML SOPN FlexTouch Pen Inject 0.15 mLs (15 Units total) into the skin daily at 10 pm. 3 pen 2  . Insulin Pen Needle (PEN NEEDLES) 31G X 8 MM MISC 1 Package by Does not apply route daily. 100 each 0  . losartan (COZAAR) 100 MG tablet Take 1 tablet (100 mg total) by mouth daily. 90 tablet 1  . meloxicam (MOBIC) 7.5 MG tablet Take 1 tablet (7.5 mg total) by mouth daily. (Patient not taking: Reported on 07/24/2016) 30 tablet 0  . metFORMIN (GLUCOPHAGE-XR) 500 MG 24 hr tablet TAKE 2 TABLETS(1000 MG) BY MOUTH DAILY WITH BREAKFAST 180 tablet 2  . omeprazole (PRILOSEC) 20 MG capsule TAKE 1 CAPSULE  BY MOUTH EVERY MORNING 90 capsule 0  . ONE TOUCH ULTRA TEST test strip USE AS DIRECTED TO TEST THREE TIMES DAILY 100 each 2  . oxyCODONE-acetaminophen (PERCOCET) 7.5-325 MG tablet Take 1 tablet by mouth every 6 (six) hours as needed for severe pain. 100 tablet 0  . simvastatin (ZOCOR) 10 MG tablet TAKE 1 TABLET BY MOUTH EVERY MORNING 90 tablet 0   No current facility-administered medications for this visit.        Physical Exam BP 140/87   Pulse 99   Wt 207 lb (93.9 kg)   BMI 36.09 kg/m  Physical Exam  Constitutional: She is oriented to person, place, and time. She appears well-developed and well-nourished. No distress.  Cardiovascular: Normal rate and intact distal pulses.   Neurological: She is alert and oriented to person,  place, and time. She has normal reflexes. She exhibits normal muscle tone. Coordination normal.  Skin: Skin is warm and dry. No rash noted. She is not diaphoretic. No erythema. No pallor.  Psychiatric: She has a normal mood and affect. Her behavior is normal. Judgment and thought content normal.   Ambulatory status normal with no assistive devices Right Shoulder Exam  Right shoulder exam is normal.  Tenderness  The patient is experiencing no tenderness.    Range of Motion  The patient has normal right shoulder ROM.  Muscle Strength  The patient has normal right shoulder strength.  Tests  Apprehension: negative  Other  Erythema: absent Sensation: normal Pulse: present   Left Shoulder Exam   Tenderness  The patient is experiencing tenderness in the acromion (Rotator interval, base of cervical spine and posterior subacromial space).  Range of Motion  Active Abduction: abnormal  Passive Abduction: abnormal  Forward Flexion: 120  External Rotation: normal   Muscle Strength  External Rotation: 5/5  Subscapularis: 5/5   Tests  Apprehension: negative Drop Arm: negative Hawkin's test: positive Impingement: positive Sulcus: absent  Other  Erythema: absent Sensation: normal Pulse: present        Data Reviewed Imaging of the left shoulder are independently reviewed and I interpreted these as normal  Assessment  Impingement syndrome left shoulder   Plan  Recommend injection but the patient declined so we started her on [place]man exercises  As I was leaving the room she was complaining of some right hip pain  She has tenderness over the back with palpable tenderness reproducing her pain in the right hip  Recommend physical therapy for 4 weeks

## 2016-08-03 ENCOUNTER — Other Ambulatory Visit: Payer: Self-pay | Admitting: Family Medicine

## 2016-08-03 NOTE — Telephone Encounter (Signed)
Patient calling to ask for rx for percocet  [phone]

## 2016-08-03 NOTE — Telephone Encounter (Signed)
Called patient informed Rx ready for pick up at front desk. Patient verbalized understanding.

## 2016-08-09 DIAGNOSIS — R69 Illness, unspecified: Secondary | ICD-10-CM | POA: Diagnosis not present

## 2016-08-14 ENCOUNTER — Ambulatory Visit: Payer: Medicare HMO | Admitting: Physical Therapy

## 2016-08-21 ENCOUNTER — Ambulatory Visit: Payer: Medicare HMO | Admitting: Nutrition

## 2016-08-21 ENCOUNTER — Ambulatory Visit: Payer: Medicare HMO | Admitting: "Endocrinology

## 2016-08-25 ENCOUNTER — Other Ambulatory Visit: Payer: Self-pay | Admitting: Family Medicine

## 2016-08-25 NOTE — Telephone Encounter (Signed)
Rx refilled per protocol 

## 2016-09-01 ENCOUNTER — Telehealth: Payer: Self-pay | Admitting: Family Medicine

## 2016-09-01 MED ORDER — OXYCODONE-ACETAMINOPHEN 7.5-325 MG PO TABS: 1.0000 | ORAL_TABLET | Freq: Four times a day (QID) | ORAL | 0 refills | Status: DC | PRN

## 2016-09-01 NOTE — Telephone Encounter (Signed)
Patient called requesting refill on percocet   CB# [phone]

## 2016-09-01 NOTE — Telephone Encounter (Signed)
Ok to refill??  Last office visit 06/20/2016.    Last refill 07/24/2016.

## 2016-09-01 NOTE — Telephone Encounter (Signed)
Prescription printed and patient made aware to come to office to pick up on Monday.   Samples left in refrigerator.

## 2016-09-01 NOTE — Telephone Encounter (Signed)
Okay to refill? 

## 2016-09-01 NOTE — Telephone Encounter (Signed)
Samples of tresiba flextouch she states she will be able to afford this next month.   CB# [phone]

## 2016-09-04 ENCOUNTER — Ambulatory Visit: Payer: Medicare HMO | Admitting: Nutrition

## 2016-09-04 ENCOUNTER — Ambulatory Visit: Payer: Medicare HMO | Admitting: "Endocrinology

## 2016-09-08 ENCOUNTER — Ambulatory Visit: Payer: Medicare HMO | Admitting: Family Medicine

## 2016-09-11 ENCOUNTER — Ambulatory Visit (INDEPENDENT_AMBULATORY_CARE_PROVIDER_SITE_OTHER): Payer: Medicare HMO | Admitting: Family Medicine

## 2016-09-11 ENCOUNTER — Encounter: Payer: Self-pay | Admitting: Family Medicine

## 2016-09-11 VITALS — BP 136/72 | HR 90 | Temp 98.7°F | Resp 12 | Ht 63.0 in | Wt 207.0 lb

## 2016-09-11 DIAGNOSIS — Z6836 Body mass index (BMI) 36.0-36.9, adult: Secondary | ICD-10-CM

## 2016-09-11 DIAGNOSIS — M5136 Other intervertebral disc degeneration, lumbar region: Secondary | ICD-10-CM | POA: Diagnosis not present

## 2016-09-11 DIAGNOSIS — E1141 Type 2 diabetes mellitus with diabetic mononeuropathy: Secondary | ICD-10-CM | POA: Diagnosis not present

## 2016-09-11 DIAGNOSIS — IMO0001 Reserved for inherently not codable concepts without codable children: Secondary | ICD-10-CM

## 2016-09-11 DIAGNOSIS — E1140 Type 2 diabetes mellitus with diabetic neuropathy, unspecified: Secondary | ICD-10-CM

## 2016-09-11 DIAGNOSIS — Z720 Tobacco use: Secondary | ICD-10-CM

## 2016-09-11 DIAGNOSIS — Z794 Long term (current) use of insulin: Secondary | ICD-10-CM

## 2016-09-11 DIAGNOSIS — E6609 Other obesity due to excess calories: Secondary | ICD-10-CM

## 2016-09-11 LAB — COMPREHENSIVE METABOLIC PANEL
ALT: 12 U/L (ref 6–29)
AST: 14 U/L (ref 10–35)
Albumin: 4.1 g/dL (ref 3.6–5.1)
Alkaline Phosphatase: 106 U/L (ref 33–130)
BUN: 17 mg/dL (ref 7–25)
CALCIUM: 10 mg/dL (ref 8.6–10.4)
CHLORIDE: 106 mmol/L (ref 98–110)
CO2: 29 mmol/L (ref 20–31)
Creat: 0.82 mg/dL (ref 0.50–0.99)
GLUCOSE: 122 mg/dL — AB (ref 70–99)
Potassium: 5 mmol/L (ref 3.5–5.3)
Sodium: 144 mmol/L (ref 135–146)
Total Bilirubin: 0.3 mg/dL (ref 0.2–1.2)
Total Protein: 7.2 g/dL (ref 6.1–8.1)

## 2016-09-11 LAB — LIPID PANEL
CHOL/HDL RATIO: 2.8 ratio (ref <5.0)
CHOLESTEROL: 124 mg/dL (ref <200)
HDL: 44 mg/dL — AB (ref >50)
LDL Cholesterol: 53 mg/dL (ref <100)
TRIGLYCERIDES: 136 mg/dL (ref <150)
VLDL: 27 mg/dL (ref <30)

## 2016-09-11 LAB — CBC WITH DIFFERENTIAL/PLATELET
BASOS ABS: 73 {cells}/uL (ref 0–200)
Basophils Relative: 1 %
EOS ABS: 219 {cells}/uL (ref 15–500)
Eosinophils Relative: 3 %
HEMATOCRIT: 37.1 % (ref 35.0–45.0)
HEMOGLOBIN: 12.2 g/dL (ref 12.0–15.0)
Lymphocytes Relative: 32 %
Lymphs Abs: 2336 cells/uL (ref 850–3900)
MCH: 31 pg (ref 27.0–33.0)
MCHC: 32.9 g/dL (ref 32.0–36.0)
MCV: 94.4 fL (ref 80.0–100.0)
MONO ABS: 438 {cells}/uL (ref 200–950)
MONOS PCT: 6 %
MPV: 9.3 fL (ref 7.5–12.5)
NEUTROS ABS: 4234 {cells}/uL (ref 1500–7800)
Neutrophils Relative %: 58 %
PLATELETS: 343 10*3/uL (ref 140–400)
RBC: 3.93 MIL/uL (ref 3.80–5.10)
RDW: 14.5 % (ref 11.0–15.0)
WBC: 7.3 10*3/uL (ref 3.8–10.8)

## 2016-09-11 LAB — HEMOGLOBIN A1C
Hgb A1c MFr Bld: 5.3 % (ref <5.7)
Mean Plasma Glucose: 105 mg/dL

## 2016-09-11 NOTE — Assessment & Plan Note (Signed)
Diabetes with neuropathy. She is very fluctuating blood sugars directly related to how she eats. Her A1c does not correspond with her blood sugars on her meter. I will recheck the A1c today as well as metabolic panel. In general her home readings are much improved from before. Discussed the importance of dietary control. She listed off all of the foods that she knows she is supposed to avoid so she deathly has knowledge of what to do and what not to do.

## 2016-09-11 NOTE — Progress Notes (Signed)
Subjective:    Patient ID: Stephanie Hammond, female    DOB: 23-Jan-1951, 66 y.o.   MRN: [id number]  Patient presents for 3 month F/U (is not fasting)  Pt here to f/u chronic medical problems Followed by endocrinology for fluctating blood sugars but not consistent on nutrition. Last seen by nutritionist in Dec 2017.   Last A1C was 5.8%, She states that she does not want to return to the nutritionists she also does not have the co-pay to go back to endocrinologist. She has been checking her blood sugars in the mornings they're typically 115 to 170s in the evenings her highest has been 264 over the past few weeks. She has been injecting [place] 20 units at bedtime and taking her metformin in the morning She does occasionally get some tingling in her right foot and the toes with this does not last long. She has known diabetic neuropathy.  He is still on her Percocet for her degenerative disc disease of her lumbar spine/chronic back pain  Declines pneumonia vaccine/Flu vaccine   Continues to smoke  Review Of Systems:  GEN- denies fatigue, fever, weight loss,weakness, recent illness HEENT- denies eye drainage, change in vision, nasal discharge, CVS- denies chest pain, palpitations RESP- denies SOB, cough, wheeze ABD- denies N/V, change in stools, abd pain GU- denies dysuria, hematuria, dribbling, incontinence MSK- + joint pain, muscle aches, injury Neuro- denies headache, dizziness, syncope, seizure activity       Objective:    BP 136/72   Pulse 100   Temp 98.7 F (37.1 C) (Oral)   Resp 12   Ht 5\' 3"  (1.6 m)   Wt 207 lb (93.9 kg)   SpO2 96%   BMI 36.67 kg/m  GEN- NAD, alert and oriented x3 HEENT- PERRL, EOMI, non injected sclera, pink conjunctiva, MMM, oropharynx clear CVS- RRR, no murmur RESP-CTAB Neuro- normal monofilament bilat, mild callus, thick great toenails EXT- No edema Pulses- Radial, DP- 2+        Assessment & Plan:      Problem List Items Addressed This  Visit    Type 2 diabetes mellitus with diabetic neuropathy (HCC) - Primary    Diabetes with neuropathy. She is very fluctuating blood sugars directly related to how she eats. Her A1c does not correspond with her blood sugars on her meter. I will recheck the A1c today as well as metabolic panel. In general her home readings are much improved from before. Discussed the importance of dietary control. She listed off all of the foods that she knows she is supposed to avoid so she deathly has knowledge of what to do and what not to do.      Relevant Orders   Hemoglobin A1c   Lipid panel   CBC with Differential/Platelet   Comprehensive metabolic panel   Hemoglobin A1c   Tobacco user    Continues to smoke she is not ready to quit. She also declines her immunizations      Diabetic neuropathy (HCC)    Minimal problems at this time will not start any new medication.      RESOLVED: Diabetes with neurologic complications (HCC)   Relevant Orders   Hemoglobin A1c   Lipid panel   CBC with Differential/Platelet   Comprehensive metabolic panel   Hemoglobin A1c   DDD (degenerative disc disease), lumbar    Pain medication will be continued.         Note: This dictation was prepared with Dragon dictation along with smaller phrase  technology. Any transcriptional errors that result from this process are unintentional.

## 2016-09-11 NOTE — Patient Instructions (Signed)
F/U 4 months  

## 2016-09-11 NOTE — Assessment & Plan Note (Signed)
Continues to smoke she is not ready to quit. She also declines her immunizations

## 2016-09-11 NOTE — Assessment & Plan Note (Signed)
Pain medication will be continued.

## 2016-09-11 NOTE — Assessment & Plan Note (Signed)
Discussed nutrition as well as any activity. She is just excited that she has not gained any weight. Continue to counsel on diet with regards to her diabetes and overall health.

## 2016-09-11 NOTE — Assessment & Plan Note (Signed)
Minimal problems at this time will not start any new medication.

## 2016-09-13 ENCOUNTER — Other Ambulatory Visit: Payer: Self-pay | Admitting: Family Medicine

## 2016-09-25 ENCOUNTER — Other Ambulatory Visit: Payer: Self-pay

## 2016-09-25 MED ORDER — OMEPRAZOLE 20 MG PO CPDR: 20.0000 mg | DELAYED_RELEASE_CAPSULE | Freq: Every morning | ORAL | 0 refills | Status: DC

## 2016-09-25 NOTE — Telephone Encounter (Signed)
Rx filled per protocol  

## 2016-09-28 ENCOUNTER — Other Ambulatory Visit: Payer: Self-pay | Admitting: *Deleted

## 2016-09-28 MED ORDER — SIMVASTATIN 10 MG PO TABS: 10.0000 mg | ORAL_TABLET | Freq: Every morning | ORAL | 1 refills | Status: DC

## 2016-10-02 ENCOUNTER — Other Ambulatory Visit: Payer: Self-pay | Admitting: *Deleted

## 2016-10-02 MED ORDER — INSULIN DEGLUDEC 100 UNIT/ML ~~LOC~~ SOPN: 15.0000 [IU] | PEN_INJECTOR | Freq: Every day | SUBCUTANEOUS | 2 refills | Status: DC

## 2016-10-02 MED ORDER — OXYCODONE-ACETAMINOPHEN 7.5-325 MG PO TABS: 1.0000 | ORAL_TABLET | Freq: Four times a day (QID) | ORAL | 0 refills | Status: DC | PRN

## 2016-10-02 NOTE — Telephone Encounter (Signed)
Okay to refill? 

## 2016-10-02 NOTE — Telephone Encounter (Signed)
Received VM from patient.   Requested refill on Oxycodone. Ok to refill?? Last office visit 09/11/2016. Last refill 09/01/2016.  Also [facility]. Refill appropriate and filled per protocol.

## 2016-10-02 NOTE — Telephone Encounter (Signed)
Prescription printed and patient made aware to come to office to pick up on 10/03/2016. 

## 2016-10-05 ENCOUNTER — Encounter: Payer: Self-pay | Admitting: Family Medicine

## 2016-10-19 ENCOUNTER — Other Ambulatory Visit: Payer: Self-pay | Admitting: Family Medicine

## 2016-10-19 DIAGNOSIS — R69 Illness, unspecified: Secondary | ICD-10-CM | POA: Diagnosis not present

## 2016-10-20 ENCOUNTER — Telehealth: Payer: Self-pay | Admitting: *Deleted

## 2016-10-20 MED ORDER — OXYCODONE-ACETAMINOPHEN 7.5-325 MG PO TABS: 1.0000 | ORAL_TABLET | Freq: Four times a day (QID) | ORAL | 0 refills | Status: DC | PRN

## 2016-10-20 MED ORDER — INSULIN DEGLUDEC 100 UNIT/ML ~~LOC~~ SOPN: 15.0000 [IU] | PEN_INJECTOR | Freq: Every day | SUBCUTANEOUS | 2 refills | Status: DC

## 2016-10-20 NOTE — Telephone Encounter (Signed)
Prescription printed.   Patient will pick up on Monday d/t transportation issues.

## 2016-10-20 NOTE — Telephone Encounter (Signed)
Received call from patient.   Requested refill on Tresiba and Percocet.   Prescription sent to pharmacy for [place].   Ok to refill Percocet post dated??  Last office visit 09/11/2016.  Last refill 10/02/2016.

## 2016-10-20 NOTE — Telephone Encounter (Signed)
Okay to refill? 

## 2016-10-23 ENCOUNTER — Telehealth: Payer: Self-pay | Admitting: Family Medicine

## 2016-10-23 NOTE — Telephone Encounter (Signed)
Okay 

## 2016-10-24 NOTE — Telephone Encounter (Signed)
Prescription sent to pharmacy on 10/20/2016.

## 2016-11-08 ENCOUNTER — Encounter: Payer: Self-pay | Admitting: Family Medicine

## 2016-11-08 ENCOUNTER — Ambulatory Visit: Payer: Medicare HMO | Admitting: Physician Assistant

## 2016-11-15 ENCOUNTER — Telehealth: Payer: Self-pay | Admitting: Family Medicine

## 2016-11-15 ENCOUNTER — Other Ambulatory Visit: Payer: Self-pay | Admitting: *Deleted

## 2016-11-15 DIAGNOSIS — M1A9XX0 Chronic gout, unspecified, without tophus (tophi): Secondary | ICD-10-CM | POA: Diagnosis not present

## 2016-11-15 DIAGNOSIS — Z9181 History of falling: Secondary | ICD-10-CM | POA: Diagnosis not present

## 2016-11-15 DIAGNOSIS — E119 Type 2 diabetes mellitus without complications: Secondary | ICD-10-CM | POA: Diagnosis not present

## 2016-11-15 DIAGNOSIS — Z87891 Personal history of nicotine dependence: Secondary | ICD-10-CM | POA: Diagnosis not present

## 2016-11-15 DIAGNOSIS — R69 Illness, unspecified: Secondary | ICD-10-CM | POA: Diagnosis not present

## 2016-11-15 DIAGNOSIS — M259 Joint disorder, unspecified: Secondary | ICD-10-CM | POA: Diagnosis not present

## 2016-11-15 DIAGNOSIS — I10 Essential (primary) hypertension: Secondary | ICD-10-CM | POA: Diagnosis not present

## 2016-11-15 DIAGNOSIS — Z0000 Encounter for general adult medical examination without abnormal findings: Secondary | ICD-10-CM | POA: Diagnosis not present

## 2016-11-15 DIAGNOSIS — K219 Gastro-esophageal reflux disease without esophagitis: Secondary | ICD-10-CM | POA: Diagnosis not present

## 2016-11-15 DIAGNOSIS — M1712 Unilateral primary osteoarthritis, left knee: Secondary | ICD-10-CM | POA: Diagnosis not present

## 2016-11-15 DIAGNOSIS — Z79899 Other long term (current) drug therapy: Secondary | ICD-10-CM | POA: Diagnosis not present

## 2016-11-15 DIAGNOSIS — Z794 Long term (current) use of insulin: Secondary | ICD-10-CM | POA: Diagnosis not present

## 2016-11-15 DIAGNOSIS — E7800 Pure hypercholesterolemia, unspecified: Secondary | ICD-10-CM | POA: Diagnosis not present

## 2016-11-15 MED ORDER — OXYCODONE-ACETAMINOPHEN 7.5-325 MG PO TABS: 1.0000 | ORAL_TABLET | Freq: Four times a day (QID) | ORAL | 0 refills | Status: DC | PRN

## 2016-11-15 MED ORDER — INSULIN DEGLUDEC 100 UNIT/ML ~~LOC~~ SOPN: 15.0000 [IU] | PEN_INJECTOR | Freq: Every day | SUBCUTANEOUS | 2 refills | Status: DC

## 2016-11-15 NOTE — Telephone Encounter (Signed)
Received VM from patient.   Requested refill on pain medications.   Patient then arrived at office to request prescription.   Last RF 10/20/2016 postdated for 10/30/2016.  MD made aware approved prescription. Prescription printed.

## 2016-11-15 NOTE — Telephone Encounter (Signed)
Pt needs refill on oxycodone, and wants [place] to [facility] in [facility] [place] [facility] the Tresiba insulin.

## 2016-11-15 NOTE — Telephone Encounter (Signed)
Please see prior note

## 2016-11-18 ENCOUNTER — Other Ambulatory Visit: Payer: Self-pay | Admitting: Family Medicine

## 2016-11-20 DIAGNOSIS — R69 Illness, unspecified: Secondary | ICD-10-CM | POA: Diagnosis not present

## 2016-11-28 ENCOUNTER — Other Ambulatory Visit: Payer: Self-pay | Admitting: Family Medicine

## 2016-12-07 ENCOUNTER — Other Ambulatory Visit: Payer: Self-pay | Admitting: *Deleted

## 2016-12-07 ENCOUNTER — Encounter: Payer: Self-pay | Admitting: Family Medicine

## 2016-12-07 MED ORDER — GLUCOSE BLOOD VI STRP: ORAL_STRIP | 3 refills | Status: DC

## 2016-12-13 DIAGNOSIS — R69 Illness, unspecified: Secondary | ICD-10-CM | POA: Diagnosis not present

## 2016-12-21 ENCOUNTER — Telehealth: Payer: Self-pay | Admitting: Family Medicine

## 2016-12-21 NOTE — Telephone Encounter (Signed)
Call placed to pharmacy to inquire.   Last refill given on 11/15/2016, postdated for 11/29/2016, filled on 4/30//2018, and picked up on 12/07/2016.  Call placed to patient and advised that prescription will not be due until 01/03/2017.  Of note, patient discharged due to not meeting financial obligations on 12/07/2016. ($30.00 bill)

## 2016-12-21 NOTE — Telephone Encounter (Signed)
Patient is calling to get rx for her oxycodone, she had to cxl appt for her cpe because her husband had another heart attack

## 2016-12-22 ENCOUNTER — Encounter: Payer: Medicare HMO | Admitting: Family Medicine

## 2016-12-22 DIAGNOSIS — R69 Illness, unspecified: Secondary | ICD-10-CM | POA: Diagnosis not present

## 2016-12-22 NOTE — Telephone Encounter (Signed)
Too early for meds, she also needs to contact [place] about her bill

## 2016-12-24 ENCOUNTER — Other Ambulatory Visit: Payer: Self-pay | Admitting: Family Medicine

## 2016-12-26 ENCOUNTER — Other Ambulatory Visit: Payer: Self-pay | Admitting: Family Medicine

## 2017-01-05 ENCOUNTER — Ambulatory Visit (INDEPENDENT_AMBULATORY_CARE_PROVIDER_SITE_OTHER): Payer: Medicare HMO | Admitting: Family Medicine

## 2017-01-05 ENCOUNTER — Encounter: Payer: Self-pay | Admitting: Family Medicine

## 2017-01-05 VITALS — BP 132/78 | HR 90 | Temp 98.1°F | Resp 14 | Ht 63.0 in | Wt 208.0 lb

## 2017-01-05 DIAGNOSIS — I10 Essential (primary) hypertension: Secondary | ICD-10-CM | POA: Diagnosis not present

## 2017-01-05 DIAGNOSIS — M5136 Other intervertebral disc degeneration, lumbar region: Secondary | ICD-10-CM | POA: Diagnosis not present

## 2017-01-05 DIAGNOSIS — M25561 Pain in right knee: Secondary | ICD-10-CM

## 2017-01-05 DIAGNOSIS — H905 Unspecified sensorineural hearing loss: Secondary | ICD-10-CM

## 2017-01-05 DIAGNOSIS — G8929 Other chronic pain: Secondary | ICD-10-CM | POA: Diagnosis not present

## 2017-01-05 DIAGNOSIS — E782 Mixed hyperlipidemia: Secondary | ICD-10-CM

## 2017-01-05 DIAGNOSIS — Z794 Long term (current) use of insulin: Secondary | ICD-10-CM

## 2017-01-05 DIAGNOSIS — E1140 Type 2 diabetes mellitus with diabetic neuropathy, unspecified: Secondary | ICD-10-CM

## 2017-01-05 DIAGNOSIS — M25562 Pain in left knee: Secondary | ICD-10-CM | POA: Diagnosis not present

## 2017-01-05 DIAGNOSIS — H9190 Unspecified hearing loss, unspecified ear: Secondary | ICD-10-CM

## 2017-01-05 MED ORDER — INSULIN DEGLUDEC 100 UNIT/ML ~~LOC~~ SOPN: 15.0000 [IU] | PEN_INJECTOR | Freq: Every day | SUBCUTANEOUS | 2 refills | Status: DC

## 2017-01-05 MED ORDER — OXYCODONE-ACETAMINOPHEN 7.5-325 MG PO TABS: 1.0000 | ORAL_TABLET | Freq: Four times a day (QID) | ORAL | 0 refills | Status: DC | PRN

## 2017-01-05 MED ORDER — MELOXICAM 7.5 MG PO TABS: 7.5000 mg | ORAL_TABLET | Freq: Every day | ORAL | 6 refills | Status: DC

## 2017-01-05 MED ORDER — LOSARTAN POTASSIUM 100 MG PO TABS: 100.0000 mg | ORAL_TABLET | Freq: Every day | ORAL | 1 refills | Status: DC

## 2017-01-05 MED ORDER — SIMVASTATIN 10 MG PO TABS: 10.0000 mg | ORAL_TABLET | Freq: Every morning | ORAL | 1 refills | Status: DC

## 2017-01-05 MED ORDER — METFORMIN HCL ER 500 MG PO TB24: ORAL_TABLET | ORAL | 2 refills | Status: DC

## 2017-01-05 MED ORDER — AMLODIPINE BESYLATE 10 MG PO TABS: 10.0000 mg | ORAL_TABLET | Freq: Every day | ORAL | 1 refills | Status: DC

## 2017-01-05 NOTE — Assessment & Plan Note (Signed)
Controlled, no changes on ARB with diabetes

## 2017-01-05 NOTE — Assessment & Plan Note (Signed)
LDL at goal on zocor

## 2017-01-05 NOTE — Progress Notes (Signed)
Subjective:    Patient ID: Stephanie Hammond, female    DOB: November 23, 1950, 66 y.o.   MRN: [id number]  Patient presents for Back Pain Patient here to follow-up her chronic medical problems. Her medications were reviewed. She has chronic pain in her back and knees and shoulder. She is on chronic pain medication she has degenerative disc disease in her lumbar spine. She is on Percocet 7.5/325 mg every 6 hours when necessary Does not want epidural injections, or surgery. Back pain worse some days, always feels stiff. No new tingling or numbness in lower extremeties  Also on meloxicam for arthritis as well   Diabetes mellitus she's currently on metformin as well as [place] 15 units she is very labile blood sugars she is unable to continue following up with the endocrinologist due to cost. Her A1c was 5.3% 3 months ago. She's always had fairly normal A1 C's but will still get significantly elevated blood sugars therefore kept on insulin. CBG fasting- 120-220, before bedtime 180-337 on meter  Lipids at goal on statin drug  Roaring in ears for many months getting progressively worse. Occurs randomly, denies any ringing in ears, feels hearing is okay    Review Of Systems:   GEN- denies fatigue, fever, weight loss,weakness, recent illness HEENT- denies eye drainage, change in vision, nasal discharge, CVS- denies chest pain, palpitations RESP- denies SOB, cough, wheeze ABD- denies N/V, change in stools, abd pain GU- denies dysuria, hematuria, dribbling, incontinence MSK- denies joint pain, muscle aches, injury Neuro- denies headache, dizziness, syncope, seizure activity       Objective:    BP 132/78   Pulse 90   Temp 98.1 F (36.7 C) (Oral)   Resp 14   Ht 5\' 3"  (1.6 m)   Wt 208 lb (94.3 kg)   SpO2 98%   BMI 36.85 kg/m  GEN- NAD, alert and oriented x3 HEENT- PERRL, EOMI, non injected sclera, pink conjunctiva, MMM, oropharynx clear,TM Clear no wax noted in canal  CVS- RRR, no  murmur RESP-CTAB MSK- TTP lumbar spine and paraspinals, fair ROM, neg SLR, fair ROM HIPS/KNEES Neuro- CNII-XII in tact, normal tone LE, strength equal bilat, non antalgic gait  EXT- No edema Pulses- Radial 2+        Assessment & Plan:      Problem List Items Addressed This Visit    Type 2 diabetes mellitus with diabetic neuropathy (HCC)    A1C often due to reflect her home readings, has seen endocrine but can not afford to return Will recheck today  Advised to consistently give th 15 units of Tresiba and metformin Will titrate insulin slowly, to keep fasting below 150      Relevant Medications   losartan (COZAAR) 100 MG tablet   metFORMIN (GLUCOPHAGE-XR) 500 MG 24 hr tablet   simvastatin (ZOCOR) 10 MG tablet   insulin degludec (TRESIBA FLEXTOUCH) 100 UNIT/ML SOPN FlexTouch Pen   Other Relevant Orders   Comprehensive metabolic panel   Hemoglobin A1c   Hyperlipidemia    LDL at goal on zocor      Relevant Medications   losartan (COZAAR) 100 MG tablet   amLODipine (NORVASC) 10 MG tablet   simvastatin (ZOCOR) 10 MG tablet   Essential hypertension, benign    Controlled, no changes on ARB with diabetes      Relevant Medications   losartan (COZAAR) 100 MG tablet   amLODipine (NORVASC) 10 MG tablet   simvastatin (ZOCOR) 10 MG tablet   DDD (degenerative disc disease),  lumbar - Primary    Referral to physical therapy, declines other modalities Continue pain meds, NSAIDS       Relevant Medications   oxyCODONE-acetaminophen (PERCOCET) 7.5-325 MG tablet   meloxicam (MOBIC) 7.5 MG tablet   Other Relevant Orders   Ambulatory referral to Physical Therapy   Chronic knee pain   Relevant Medications   oxyCODONE-acetaminophen (PERCOCET) 7.5-325 MG tablet   meloxicam (MOBIC) 7.5 MG tablet   Other Relevant Orders   Ambulatory referral to Physical Therapy    Other Visit Diagnoses    Perceived hearing changes       Normal hearing exam in office, has some background noise, send  to ENT for evaluation   Relevant Orders   Ambulatory referral to ENT      Note: This dictation was prepared with Dragon dictation along with smaller phrase technology. Any transcriptional errors that result from this process are unintentional.

## 2017-01-05 NOTE — Patient Instructions (Addendum)
F/U 4 months for Physical Cancel appt for June 11 Referral for physical therapy

## 2017-01-05 NOTE — Assessment & Plan Note (Signed)
Referral to physical therapy, declines other modalities Continue pain meds, NSAIDS

## 2017-01-05 NOTE — Assessment & Plan Note (Signed)
A1C often due to reflect her home readings, has seen endocrine but can not afford to return Will recheck today  Advised to consistently give th 15 units of Tresiba and metformin Will titrate insulin slowly, to keep fasting below 150

## 2017-01-09 ENCOUNTER — Encounter: Payer: Self-pay | Admitting: Family Medicine

## 2017-01-15 ENCOUNTER — Ambulatory Visit: Payer: Medicare HMO | Admitting: Family Medicine

## 2017-01-18 ENCOUNTER — Ambulatory Visit: Payer: Medicare HMO | Admitting: Physical Therapy

## 2017-01-21 DIAGNOSIS — R69 Illness, unspecified: Secondary | ICD-10-CM | POA: Diagnosis not present

## 2017-01-24 ENCOUNTER — Ambulatory Visit: Payer: Medicare HMO | Attending: Family Medicine | Admitting: Physical Therapy

## 2017-01-24 DIAGNOSIS — M545 Low back pain: Secondary | ICD-10-CM | POA: Insufficient documentation

## 2017-01-24 DIAGNOSIS — R293 Abnormal posture: Secondary | ICD-10-CM | POA: Diagnosis present

## 2017-01-24 DIAGNOSIS — G8929 Other chronic pain: Secondary | ICD-10-CM | POA: Diagnosis present

## 2017-01-24 NOTE — Therapy (Signed)
Nustep; hip bridges.   Consulted and Agree with Plan of Care Patient      Patient will benefit from skilled therapeutic intervention in order to improve the following deficits and impairments:  Decreased activity tolerance, Postural dysfunction, Pain  Visit Diagnosis: Chronic right-sided low back pain, with sciatica presence unspecified - Plan: PT plan of care cert/re-cert  Abnormal posture - Plan: PT plan of care cert/re-cert      G-Codes - 77/41/42 1051    Functional Assessment Tool Used (Outpatient Only) FOTO...65% limitation.   Functional Limitation Mobility: Walking and moving around   Mobility: Walking and Moving Around Current Status [phone]) At least 60 percent but less than 80 percent impaired, limited or restricted   Mobility: Walking and Moving Around Goal Status [phone]) At least 20 percent but less than 40 percent impaired, limited or restricted       Problem List Patient Active Problem List   Diagnosis Date Noted  . Anemia 12/31/2015  . Arthritis of knee, degenerative 03/12/2015  . Arthritis of left knee   . Gout 09/07/2014  . Back pain with left-sided radiculopathy 09/07/2014  . Type 2 diabetes mellitus with diabetic neuropathy ([place]) 02/14/2014  . Hyperlipidemia 03/12/2013  . IBS (irritable bowel syndrome) 01/09/2013  . Diabetic neuropathy ([place]) 11/14/2012  . Diarrhea 09/13/2012  . Major depression, single episode 09/13/2012  . Essential hypertension, benign 06/09/2012  . DDD (degenerative disc disease), lumbar 03/28/2012  . Chronic knee pain 03/28/2012  . Obesity 03/28/2012  . Tobacco user 03/28/2012  . CAD (coronary artery disease) 03/28/2012    Jackelyne Sayer, [place] MPT 01/24/2017, 11:59 AM  [facility] [address] [place], [place], [address] Phone: [phone]   Fax:  [phone]  Name: Stephanie Hammond MRN: [id number] Date of Birth: 08-16-1950  Nustep; hip bridges.   Consulted and Agree with Plan of Care Patient      Patient will benefit from skilled therapeutic intervention in order to improve the following deficits and impairments:  Decreased activity tolerance, Postural dysfunction, Pain  Visit Diagnosis: Chronic right-sided low back pain, with sciatica presence unspecified - Plan: PT plan of care cert/re-cert  Abnormal posture - Plan: PT plan of care cert/re-cert      G-Codes - 77/41/42 1051    Functional Assessment Tool Used (Outpatient Only) FOTO...65% limitation.   Functional Limitation Mobility: Walking and moving around   Mobility: Walking and Moving Around Current Status [phone]) At least 60 percent but less than 80 percent impaired, limited or restricted   Mobility: Walking and Moving Around Goal Status [phone]) At least 20 percent but less than 40 percent impaired, limited or restricted       Problem List Patient Active Problem List   Diagnosis Date Noted  . Anemia 12/31/2015  . Arthritis of knee, degenerative 03/12/2015  . Arthritis of left knee   . Gout 09/07/2014  . Back pain with left-sided radiculopathy 09/07/2014  . Type 2 diabetes mellitus with diabetic neuropathy ([place]) 02/14/2014  . Hyperlipidemia 03/12/2013  . IBS (irritable bowel syndrome) 01/09/2013  . Diabetic neuropathy ([place]) 11/14/2012  . Diarrhea 09/13/2012  . Major depression, single episode 09/13/2012  . Essential hypertension, benign 06/09/2012  . DDD (degenerative disc disease), lumbar 03/28/2012  . Chronic knee pain 03/28/2012  . Obesity 03/28/2012  . Tobacco user 03/28/2012  . CAD (coronary artery disease) 03/28/2012    Jackelyne Sayer, [place] MPT 01/24/2017, 11:59 AM  [facility] [address] [place], [place], [address] Phone: [phone]   Fax:  [phone]  Name: Stephanie Hammond MRN: [id number] Date of Birth: 08-16-1950  [facility] [place], [place], [address] Phone: [phone]   Fax:  [phone]  Physical Therapy Evaluation  Patient Details  Name: Stephanie Hammond MRN: [id number] Date of Birth: 12/08/1950 Referring Provider: Vic Blackbird  Encounter Date: 01/24/2017      PT End of Session - 01/24/17 1155    Visit Number 1   Number of Visits 12   Date for PT Re-Evaluation 03/07/17   PT Start Time 1030   PT Stop Time 1119   PT Time Calculation (min) 49 min   Activity Tolerance Patient tolerated treatment well   Behavior During Therapy [facility] for tasks assessed/performed      Past Medical History:  Diagnosis Date  . Anemia   . Blood transfusion 2009   after GI bleed  . CAD (coronary artery disease)   . Diabetes mellitus   . Diverticulitis   . Diverticulosis   . GERD (gastroesophageal reflux disease)   . Gout   . History of GI diverticular bleed 08/2004  . Hypertension   . IBS (irritable bowel syndrome)   . Osteoarthritis     Past Surgical History:  Procedure Laterality Date  . CHOLECYSTECTOMY  2003  . COLONOSCOPY  2000   Dr. Fuller Plan: marked diverticulosis, difficult procedure due to adhesions, polyps benign  . COLONOSCOPY  2006   Dr. Henrene Pastor: severe pandiverticulosis  . COLONOSCOPY N/A 02/03/2013   SLF: 4 COLORECTAL POLYPS REMOVED/Moderate diverticulosis throughout the entire examined colon/Small internal hemorrhoids  . JOINT REPLACEMENT Right 2009   Knee Replacement Right   . PTCA    . REPLACEMENT TOTAL KNEE     right   . TOTAL KNEE ARTHROPLASTY Left 03/12/2015   Procedure: LEFT TOTAL KNEE REPLACEMENT;  Surgeon: Carole Civil, MD;  Location: [facility];  Service: Orthopedics;  Laterality: Left;  [name] VAGINAL HYSTERECTOMY      There were no vitals filed for this visit.       Subjective Assessment - 01/24/17 1046    Subjective The patient prsents to OPPT with c/o right sided low back pain that radiates down her leg.  The  patient can become severe at time especially with prolonged standing.  Her pain-level today is a 6/10.  Lying down decreases her pain.   Pertinent History Long h/o low back pain.   Limitations Standing   How long can you stand comfortably? 10 minutes.   Diagnostic tests MRI.   Patient Stated Goals Get out of pain.   Currently in Pain? Yes   Pain Score 6    Pain Location Back   Pain Orientation Right   Pain Descriptors / Indicators Sharp   Pain Type Chronic pain   Pain Onset More than a month ago   Pain Frequency Constant   Aggravating Factors  See above.   Pain Relieving Factors See above.            [facility] PT Assessment - 01/24/17 0001      Assessment   Medical Diagnosis Lumbar DDD.   Referring Provider [facility]   Onset Date/Surgical Date --  Years.     Precautions   Precautions None     Restrictions   Weight Bearing Restrictions No     Balance Screen   Has the patient fallen in the past 6 months No   Has the patient had a decrease in activity level because of a fear of falling?  No   Is the patient reluctant to leave their home because

## 2017-01-31 ENCOUNTER — Telehealth: Payer: Self-pay | Admitting: Family Medicine

## 2017-01-31 ENCOUNTER — Encounter: Payer: Medicare HMO | Admitting: Physical Therapy

## 2017-01-31 MED ORDER — OXYCODONE-ACETAMINOPHEN 7.5-325 MG PO TABS: 1.0000 | ORAL_TABLET | Freq: Four times a day (QID) | ORAL | 0 refills | Status: DC | PRN

## 2017-01-31 NOTE — Telephone Encounter (Signed)
okay

## 2017-01-31 NOTE — Telephone Encounter (Signed)
Ok to refill??  Last office visit/ refill 01/05/2017.

## 2017-01-31 NOTE — Telephone Encounter (Signed)
Pt needs refill on oxycodone.  °

## 2017-01-31 NOTE — Telephone Encounter (Signed)
Prescription printed and patient made aware to come to office to pick up 02/01/2017.

## 2017-02-01 ENCOUNTER — Encounter: Payer: Self-pay | Admitting: Physical Therapy

## 2017-02-01 ENCOUNTER — Ambulatory Visit: Payer: Medicare HMO | Admitting: Physical Therapy

## 2017-02-01 DIAGNOSIS — M545 Low back pain: Principal | ICD-10-CM

## 2017-02-01 DIAGNOSIS — R293 Abnormal posture: Secondary | ICD-10-CM

## 2017-02-01 DIAGNOSIS — G8929 Other chronic pain: Secondary | ICD-10-CM

## 2017-02-01 NOTE — Therapy (Signed)
[facility] [address] [place], [place], [address] Phone: [phone]   Fax:  [phone]  Physical Therapy Treatment  Patient Details  Name: Stephanie Hammond MRN: [id number] Date of Birth: [birth year] Referring Provider: Milinda Antis  Encounter Date: 02/01/2017      PT End of Session - 02/01/17 0828    Visit Number 2   Number of Visits 12   Date for PT Re-Evaluation 03/07/17   PT Start Time 0824   PT Stop Time 0846   PT Time Calculation (min) 22 min   Activity Tolerance Patient tolerated treatment well   Behavior During Therapy [facility] for tasks assessed/performed      Past Medical History:  Diagnosis Date  . Anemia   . Blood transfusion 2009   after GI bleed  . CAD (coronary artery disease)   . Diabetes mellitus   . Diverticulitis   . Diverticulosis   . GERD (gastroesophageal reflux disease)   . Gout   . History of GI diverticular bleed 08/2004  . Hypertension   . IBS (irritable bowel syndrome)   . Osteoarthritis     Past Surgical History:  Procedure Laterality Date  . CHOLECYSTECTOMY  2003  . COLONOSCOPY  2000   Dr. Russella Dar: marked diverticulosis, difficult procedure due to adhesions, polyps benign  . COLONOSCOPY  2006   Dr. Marina Goodell: severe pandiverticulosis  . COLONOSCOPY N/A 02/03/2013   SLF: 4 COLORECTAL POLYPS REMOVED/Moderate diverticulosis throughout the entire examined colon/Small internal hemorrhoids  . JOINT REPLACEMENT Right 2009   Knee Replacement Right   . PTCA    . REPLACEMENT TOTAL KNEE     right   . TOTAL KNEE ARTHROPLASTY Left 03/12/2015   Procedure: LEFT TOTAL KNEE REPLACEMENT;  Surgeon: Vickki Hearing, MD;  Location: [facility];  Service: Orthopedics;  Laterality: Left;  [name] VAGINAL HYSTERECTOMY      There were no vitals filed for this visit.      Subjective Assessment - 02/01/17 0826    Subjective Reports that some days her back is good but sometimes she can hardly move. Reports no pain today  but states that she is trying to avoid surgery.   Pertinent History Long h/o low back pain.   Limitations Standing   How long can you stand comfortably? 10 minutes.   Diagnostic tests MRI.   Patient Stated Goals Get out of pain.   Currently in Pain? No/denies            [facility] PT Assessment - 02/01/17 0001      Assessment   Medical Diagnosis Lumbar DDD.   Next MD Visit No follow up scheduled     Precautions   Precautions None     Restrictions   Weight Bearing Restrictions No                     [facility] Adult PT Treatment/Exercise - 02/01/17 0001      Exercises   Exercises Lumbar     Lumbar Exercises: Aerobic   Stationary Bike NuStep L5 x10 min     Modalities   Modalities Ultrasound     Ultrasound   Ultrasound Location R SI joint   Ultrasound Parameters 1.5 w/cm2, 100%, 1 mhz x10 min   Ultrasound Goals Pain                  PT Short Term Goals - 01/24/17 1152      PT SHORT TERM GOAL #1   Title  STG's=LTG's.           PT Long Term Goals - 01/24/17 1153      PT LONG TERM GOAL #1   Title Independent with a HEP.   Time 6   Period Weeks   Status New     PT LONG TERM GOAL #2   Title Stand 20 minutes with pain not > 3/10.   Time 6   Period Weeks   Status New     PT LONG TERM GOAL #3   Title Eliminate right LE pain.   Time 6   Period Weeks   Status New               Plan - 02/01/17 0847    Clinical Impression Statement Patient tolerated today's treatment well as she arrived with no low back pain. Patient continues to experience RLE radiating pain per patient report. Patient able to complete Nustep without reports of pain. Patient still very tender to light palpation over R SI joint still today. Patient requested to complete US in prone today per preferred positioning. Normal modalities response noted following removal of the modalities. Patient denied any other treatment options today.   Rehab Potential Good   PT  Frequency 2x / week   PT Duration 6 weeks   PT Treatment/Interventions ADLs/Self Care Home Management;Cryotherapy;Moist Heat;Ultrasound;Therapeutic activities;Therapeutic exercise;Patient/family education;Manual techniques   PT Next Visit Plan E'stim and U/S over right SIJ region.  Gentle STW/M with patient in sdly with 2 pillows between knees for comfort.  Nustep; hip bridges.   Consulted and Agree with Plan of Care Patient      Patient will benefit from skilled therapeutic intervention in order to improve the following deficits and impairments:  Decreased activity tolerance, Postural dysfunction, Pain  Visit Diagnosis: Chronic right-sided low back pain, with sciatica presence unspecified  Abnormal posture     Problem List Patient Active Problem List   Diagnosis Date Noted  . Anemia 12/31/2015  . Arthritis of knee, degenerative 03/12/2015  . Arthritis of left knee   . Gout 09/07/2014  . Back pain with left-sided radiculopathy 09/07/2014  . Type 2 diabetes mellitus with diabetic neuropathy ([facility]) 02/14/2014  . Hyperlipidemia 03/12/2013  . IBS (irritable bowel syndrome) 01/09/2013  . Diabetic neuropathy ([facility]) 11/14/2012  . Diarrhea 09/13/2012  . Major depression, single episode 09/13/2012  . Essential hypertension, benign 06/09/2012  . DDD (degenerative disc disease), lumbar 03/28/2012  . Chronic knee pain 03/28/2012  . Obesity 03/28/2012  . Tobacco user 03/28/2012  . CAD (coronary artery disease) 03/28/2012    Evelene Croon, PTA 02/01/2017, 8:53 AM  [facility] [address] [place], [place], [address] Phone: [phone]   Fax:  [phone]  Name: Stephanie Hammond MRN: [id number] Date of Birth: 12-10-1950

## 2017-02-08 ENCOUNTER — Encounter: Payer: Medicare HMO | Admitting: Physical Therapy

## 2017-02-12 ENCOUNTER — Encounter: Payer: Medicare HMO | Admitting: *Deleted

## 2017-02-14 ENCOUNTER — Encounter: Payer: Self-pay | Admitting: Physical Therapy

## 2017-02-14 ENCOUNTER — Ambulatory Visit: Payer: Medicare HMO | Attending: Family Medicine | Admitting: Physical Therapy

## 2017-02-14 DIAGNOSIS — M545 Low back pain: Secondary | ICD-10-CM | POA: Diagnosis present

## 2017-02-14 DIAGNOSIS — G8929 Other chronic pain: Secondary | ICD-10-CM | POA: Insufficient documentation

## 2017-02-14 DIAGNOSIS — R293 Abnormal posture: Secondary | ICD-10-CM | POA: Diagnosis present

## 2017-02-14 NOTE — Patient Instructions (Signed)
Brushing Teeth    Place one foot on ledge and one hand on counter. Bend other knee slightly to keep back straight.   Refrigerator   Squat with knees apart to reach lower shelves and drawers.   Copyright  VHI. All rights reserved.  Laundry [employer] down and hold basket close to stand. Use leg muscles to do the work.   Housework - Vacuuming   Hold the vacuum with arm held at side. Step back and forth to move it, keeping head up. Avoid twisting.    Housework - Wiping   Position yourself as close as possible to reach work surface. Avoid straining your back.    Sleeping on Side   Place pillow between knees. Use cervical support under neck and a roll around waist as needed.   Copyright  VHI. All rights reserved.  Log Roll   Lying on back, bend left knee and place left arm across chest. Roll all in one movement to the right. Reverse to roll to the left. Always move as one unit.   Copyright  VHI. All rights reserved.  Stand to Sit / Sit to Stand   To sit: Bend knees to lower self onto front edge of chair, then scoot back on seat. To stand: Reverse sequence by placing one foot forward, and scoot to front of seat. Use rocking motion to stand up.  Posture - Standing   Good posture is important. Avoid slouching and forward head thrust. Maintain curve in low back and align ears over shoul- ders, hips over ankles.    Posture - Sitting   Sit upright, head facing forward. Try using a roll to support lower back. Keep shoulders relaxed, and avoid rounded back. Keep hips level with knees. Avoid crossing legs for long periods.

## 2017-02-14 NOTE — Therapy (Signed)
[facility] [address] [place], [place], [address] Phone: [phone]   Fax:  [phone]  Physical Therapy Treatment  Patient Details  Name: Stephanie Hammond MRN: [id number] Date of Birth: 1951/03/22 Referring Provider: Milinda Antis  Encounter Date: 02/14/2017      PT End of Session - 02/14/17 1053    Visit Number 3   Number of Visits 12   Date for PT Re-Evaluation 03/07/17   PT Start Time 1030   PT Stop Time 1110   PT Time Calculation (min) 40 min   Activity Tolerance Patient tolerated treatment well   Behavior During Therapy [facility] for tasks assessed/performed      Past Medical History:  Diagnosis Date  . Anemia   . Blood transfusion 2009   after GI bleed  . CAD (coronary artery disease)   . Diabetes mellitus   . Diverticulitis   . Diverticulosis   . GERD (gastroesophageal reflux disease)   . Gout   . History of GI diverticular bleed 08/2004  . Hypertension   . IBS (irritable bowel syndrome)   . Osteoarthritis     Past Surgical History:  Procedure Laterality Date  . CHOLECYSTECTOMY  2003  . COLONOSCOPY  2000   Dr. Russella Dar: marked diverticulosis, difficult procedure due to adhesions, polyps benign  . COLONOSCOPY  2006   Dr. Marina Goodell: severe pandiverticulosis  . COLONOSCOPY N/A 02/03/2013   SLF: 4 COLORECTAL POLYPS REMOVED/Moderate diverticulosis throughout the entire examined colon/Small internal hemorrhoids  . JOINT REPLACEMENT Right 2009   Knee Replacement Right   . PTCA    . REPLACEMENT TOTAL KNEE     right   . TOTAL KNEE ARTHROPLASTY Left 03/12/2015   Procedure: LEFT TOTAL KNEE REPLACEMENT;  Surgeon: Vickki Hearing, MD;  Location: [facility];  Service: Orthopedics;  Laterality: Left;  [name] VAGINAL HYSTERECTOMY      There were no vitals filed for this visit.      Subjective Assessment - 02/14/17 1039    Subjective Patient reports ongoing pain in low back esp with standing. Patient sick today from other medical  issues   Pertinent History Long h/o low back pain.   Limitations Standing   How long can you stand comfortably? 10 minutes.   Diagnostic tests MRI.   Patient Stated Goals Get out of pain.   Currently in Pain? Yes   Pain Score 4    Pain Location Back   Pain Orientation Right;Left;Lower   Pain Descriptors / Indicators Nagging;Sharp   Pain Type Chronic pain   Pain Onset More than a month ago   Pain Frequency Constant   Aggravating Factors  standing or any prolong position   Pain Relieving Factors at rest                         [facility] Adult PT Treatment/Exercise - 02/14/17 0001      Self-Care   Self-Care Lifting;ADL's;Posture   ADL's cleaning, cooking, bending and daily tasks reported   Lifting power lift and techniques   Posture in all positions     Lumbar Exercises: Aerobic   Stationary Bike NuStep L5 x7 min draw in focus     Lumbar Exercises: Supine   Ab Set 20 reps;5 seconds   Glut Set 5 seconds;20 reps   Bent Knee Raise 20 reps;3 seconds   Bridge Limitations unable due to pain   Straight Leg Raise 3 seconds  2x10     Moist Heat  Therapy   Number Minutes Moist Heat 10 Minutes   Moist Heat Location Lumbar Spine     Electrical Stimulation   Electrical Stimulation Location low back   Electrical Stimulation Action premod   Electrical Stimulation Parameters 80-150hz  x79min   Electrical Stimulation Goals Pain                PT Education - 02/14/17 1104    Education provided Yes   Education Details HEP for posture aareness techniques   Person(s) Educated Patient   Methods Explanation;Demonstration;Handout   Comprehension Verbalized understanding;Returned demonstration          PT Short Term Goals - 01/24/17 1152      PT SHORT TERM GOAL #1   Title STG's=LTG's.           PT Long Term Goals - 02/14/17 1057      PT LONG TERM GOAL #1   Title Independent with a HEP.   Time 6   Period Weeks   Status On-going     PT LONG TERM GOAL  #2   Title Stand 20 minutes with pain not > 3/10.   Time 6   Period Weeks   Status On-going     PT LONG TERM GOAL #3   Title Eliminate right LE pain.   Time 6   Period Weeks   Status On-going               Plan - 02/14/17 1106    Clinical Impression Statement Patient tolerated treatment fairly well today. Patient limited due to being sick with stomach/other medical issues reported. Patient was educated on posture awareness techniques with HEP issued for techniques. Patient able to perform core activation techniques with good technique and understanding.  Patient goals ongoing at this tme due to pain deficts.    Rehab Potential Good   PT Frequency 2x / week   PT Duration 6 weeks   PT Treatment/Interventions ADLs/Self Care Home Management;Cryotherapy;Moist Heat;Ultrasound;Therapeutic activities;Therapeutic exercise;Patient/family education;Manual techniques   PT Next Visit Plan cont with POC per MPT   Consulted and Agree with Plan of Care Patient      Patient will benefit from skilled therapeutic intervention in order to improve the following deficits and impairments:  Decreased activity tolerance, Postural dysfunction, Pain  Visit Diagnosis: Chronic right-sided low back pain, with sciatica presence unspecified  Abnormal posture     Problem List Patient Active Problem List   Diagnosis Date Noted  . Anemia 12/31/2015  . Arthritis of knee, degenerative 03/12/2015  . Arthritis of left knee   . Gout 09/07/2014  . Back pain with left-sided radiculopathy 09/07/2014  . Type 2 diabetes mellitus with diabetic neuropathy ([facility]) 02/14/2014  . Hyperlipidemia 03/12/2013  . IBS (irritable bowel syndrome) 01/09/2013  . Diabetic neuropathy ([facility]) 11/14/2012  . Diarrhea 09/13/2012  . Major depression, single episode 09/13/2012  . Essential hypertension, benign 06/09/2012  . DDD (degenerative disc disease), lumbar 03/28/2012  . Chronic knee pain 03/28/2012  . Obesity 03/28/2012   . Tobacco user 03/28/2012  . CAD (coronary artery disease) 03/28/2012    Benji Poynter P, PTA 02/14/2017, 11:19 AM  [facility] [address] [place], [place], [address] Phone: [phone]   Fax:  [phone]  Name: Stephanie Hammond MRN: [id number] Date of Birth: 05-13-1951

## 2017-02-20 ENCOUNTER — Encounter: Payer: Medicare HMO | Admitting: *Deleted

## 2017-02-21 ENCOUNTER — Encounter: Payer: Self-pay | Admitting: Physical Therapy

## 2017-02-21 ENCOUNTER — Ambulatory Visit: Payer: Medicare HMO | Admitting: Physical Therapy

## 2017-02-21 DIAGNOSIS — M545 Low back pain: Secondary | ICD-10-CM | POA: Diagnosis not present

## 2017-02-21 DIAGNOSIS — G8929 Other chronic pain: Secondary | ICD-10-CM

## 2017-02-21 DIAGNOSIS — R293 Abnormal posture: Secondary | ICD-10-CM

## 2017-02-21 NOTE — Therapy (Addendum)
[facility] [place], [place], [address] Phone: [phone]   Fax:  [phone]  Physical Therapy Treatment  Patient Details  Name: Stephanie Hammond MRN: [id number] Date of Birth: 1951/07/13 Referring Provider: Vic Blackbird  Encounter Date: 02/21/2017      PT End of Session - 02/21/17 1038    Visit Number 4   Number of Visits 12   Date for PT Re-Evaluation 03/07/17   PT Start Time 1031   PT Stop Time 1113   PT Time Calculation (min) 42 min   Activity Tolerance Patient tolerated treatment well   Behavior During Therapy [facility] for tasks assessed/performed      Past Medical History:  Diagnosis Date  . Anemia   . Blood transfusion 2009   after GI bleed  . CAD (coronary artery disease)   . Diabetes mellitus   . Diverticulitis   . Diverticulosis   . GERD (gastroesophageal reflux disease)   . Gout   . History of GI diverticular bleed 08/2004  . Hypertension   . IBS (irritable bowel syndrome)   . Osteoarthritis     Past Surgical History:  Procedure Laterality Date  . CHOLECYSTECTOMY  2003  . COLONOSCOPY  2000   Dr. Fuller Plan: marked diverticulosis, difficult procedure due to adhesions, polyps benign  . COLONOSCOPY  2006   Dr. Henrene Pastor: severe pandiverticulosis  . COLONOSCOPY N/A 02/03/2013   SLF: 4 COLORECTAL POLYPS REMOVED/Moderate diverticulosis throughout the entire examined colon/Small internal hemorrhoids  . JOINT REPLACEMENT Right 2009   Knee Replacement Right   . PTCA    . REPLACEMENT TOTAL KNEE     right   . TOTAL KNEE ARTHROPLASTY Left 03/12/2015   Procedure: LEFT TOTAL KNEE REPLACEMENT;  Surgeon: Carole Civil, MD;  Location: [facility];  Service: Orthopedics;  Laterality: Left;  [name] VAGINAL HYSTERECTOMY      There were no vitals filed for this visit.      Subjective Assessment - 02/21/17 1030    Subjective Reports that she doesn't think PT will do any good and thinks that she may give PT another week.  States that she knows    Pertinent History Long h/o low back pain.   Limitations Standing   How long can you stand comfortably? 10 minutes.   Diagnostic tests MRI.   Patient Stated Goals Get out of pain.   Currently in Pain? Other (Comment)  No pain assessment provided by patient            [facility] PT Assessment - 02/21/17 0001      Assessment   Medical Diagnosis Lumbar DDD.   Next MD Visit No follow up scheduled     Precautions   Precautions None     Restrictions   Weight Bearing Restrictions No                     [facility] Adult PT Treatment/Exercise - 02/21/17 0001      Lumbar Exercises: Aerobic   Stationary Bike NuStep L5 x15 min draw in focus     Lumbar Exercises: Supine   Ab Set 15 reps;5 seconds   Glut Set 15 reps;5 seconds   Clam 20 reps   Bent Knee Raise 20 reps;2 seconds     Modalities   Modalities Electrical Stimulation;Moist Heat     Moist Heat Therapy   Number Minutes Moist Heat 10 Minutes   Moist Heat Location Lumbar Spine     Electrical Stimulation  [facility] [place], [place], [address] Phone: [phone]   Fax:  [phone]  Physical Therapy Treatment  Patient Details  Name: Stephanie Hammond MRN: [id number] Date of Birth: 1951/07/13 Referring Provider: Vic Blackbird  Encounter Date: 02/21/2017      PT End of Session - 02/21/17 1038    Visit Number 4   Number of Visits 12   Date for PT Re-Evaluation 03/07/17   PT Start Time 1031   PT Stop Time 1113   PT Time Calculation (min) 42 min   Activity Tolerance Patient tolerated treatment well   Behavior During Therapy [facility] for tasks assessed/performed      Past Medical History:  Diagnosis Date  . Anemia   . Blood transfusion 2009   after GI bleed  . CAD (coronary artery disease)   . Diabetes mellitus   . Diverticulitis   . Diverticulosis   . GERD (gastroesophageal reflux disease)   . Gout   . History of GI diverticular bleed 08/2004  . Hypertension   . IBS (irritable bowel syndrome)   . Osteoarthritis     Past Surgical History:  Procedure Laterality Date  . CHOLECYSTECTOMY  2003  . COLONOSCOPY  2000   Dr. Fuller Plan: marked diverticulosis, difficult procedure due to adhesions, polyps benign  . COLONOSCOPY  2006   Dr. Henrene Pastor: severe pandiverticulosis  . COLONOSCOPY N/A 02/03/2013   SLF: 4 COLORECTAL POLYPS REMOVED/Moderate diverticulosis throughout the entire examined colon/Small internal hemorrhoids  . JOINT REPLACEMENT Right 2009   Knee Replacement Right   . PTCA    . REPLACEMENT TOTAL KNEE     right   . TOTAL KNEE ARTHROPLASTY Left 03/12/2015   Procedure: LEFT TOTAL KNEE REPLACEMENT;  Surgeon: Carole Civil, MD;  Location: [facility];  Service: Orthopedics;  Laterality: Left;  [name] VAGINAL HYSTERECTOMY      There were no vitals filed for this visit.      Subjective Assessment - 02/21/17 1030    Subjective Reports that she doesn't think PT will do any good and thinks that she may give PT another week.  States that she knows    Pertinent History Long h/o low back pain.   Limitations Standing   How long can you stand comfortably? 10 minutes.   Diagnostic tests MRI.   Patient Stated Goals Get out of pain.   Currently in Pain? Other (Comment)  No pain assessment provided by patient            [facility] PT Assessment - 02/21/17 0001      Assessment   Medical Diagnosis Lumbar DDD.   Next MD Visit No follow up scheduled     Precautions   Precautions None     Restrictions   Weight Bearing Restrictions No                     [facility] Adult PT Treatment/Exercise - 02/21/17 0001      Lumbar Exercises: Aerobic   Stationary Bike NuStep L5 x15 min draw in focus     Lumbar Exercises: Supine   Ab Set 15 reps;5 seconds   Glut Set 15 reps;5 seconds   Clam 20 reps   Bent Knee Raise 20 reps;2 seconds     Modalities   Modalities Electrical Stimulation;Moist Heat     Moist Heat Therapy   Number Minutes Moist Heat 10 Minutes   Moist Heat Location Lumbar Spine     Electrical Stimulation  Electrical Stimulation Location B SI joint   Electrical Stimulation Action Pre-Mod   Electrical Stimulation Parameters 80-150 hz x10 min   Electrical Stimulation Goals Pain                  PT Short Term Goals - 01/24/17 1152      PT SHORT TERM GOAL #1   Title STG's=LTG's.           PT Long Term Goals - 02/14/17 1057      PT LONG TERM GOAL #1   Title Independent with a HEP.   Time 6   Period Weeks   Status On-going     PT LONG TERM GOAL #2   Title Stand 20 minutes with pain not > 3/10.   Time 6   Period Weeks   Status On-going     PT LONG TERM GOAL #3   Title Eliminate right LE pain.   Time 6   Period Weeks   Status On-going               Plan - 02/21/17 1106    Clinical Impression Statement Patient tolerated today's treatment fairly well as she continues to have increased pain in low back. Patient stll willing to continue PT but doubtful of any  progress. Patient able to complete exercises fairly well as she reported some discomfort on Nustep as well as facial grimacing with transitionary movements. Patient educated regarding proper core activation technique for core strengthening. Only repot during supine exercises was of cramp in L groin region. Patient agreed to shortened modalities session to reduce pain with normal response.   Rehab Potential Good   PT Frequency 2x / week   PT Duration 6 weeks   PT Treatment/Interventions ADLs/Self Care Home Management;Cryotherapy;Moist Heat;Ultrasound;Therapeutic activities;Therapeutic exercise;Patient/family education;Manual techniques   PT Next Visit Plan cont with POC per MPT   Consulted and Agree with Plan of Care Patient      Patient will benefit from skilled therapeutic intervention in order to improve the following deficits and impairments:  Decreased activity tolerance, Postural dysfunction, Pain  Visit Diagnosis: Chronic right-sided low back pain, with sciatica presence unspecified  Abnormal posture     Problem List Patient Active Problem List   Diagnosis Date Noted  . Anemia 12/31/2015  . Arthritis of knee, degenerative 03/12/2015  . Arthritis of left knee   . Gout 09/07/2014  . Back pain with left-sided radiculopathy 09/07/2014  . Type 2 diabetes mellitus with diabetic neuropathy ([place]) 02/14/2014  . Hyperlipidemia 03/12/2013  . IBS (irritable bowel syndrome) 01/09/2013  . Diabetic neuropathy ([place]) 11/14/2012  . Diarrhea 09/13/2012  . Major depression, single episode 09/13/2012  . Essential hypertension, benign 06/09/2012  . DDD (degenerative disc disease), lumbar 03/28/2012  . Chronic knee pain 03/28/2012  . Obesity 03/28/2012  . Tobacco user 03/28/2012  . CAD (coronary artery disease) 03/28/2012    Wynelle Fanny, PTA 02/21/2017, 11:25 AM  [facility] [address] [place], [place], [address] Phone: [phone]    Fax:  [phone]  Name: SEVANA GRANDINETTI MRN: [id number] Date of Birth: 09/07/1950  PHYSICAL THERAPY DISCHARGE SUMMARY  Visits from Start of Care: 4.  Current functional level related to goals / functional outcomes: See above.   Remaining deficits: Continued low back pain.   Education / Equipment: HEP. Plan: Patient agrees to discharge.  Patient goals were not met. Patient is being discharged due to not returning since the last visit.  ?????

## 2017-02-23 IMAGING — MG 2D DIGITAL DIAGNOSTIC BILATERAL MAMMOGRAM WITH CAD AND ADJUNCT T
6 of 10 series · 6 of 26 positions shown · non-contrast
Comparison: Previous exam(s).

ACR Breast Density Category a: The breast tissue is almost entirely
fatty.

CLINICAL DATA: Recent tenderness in the axillary region of the left
breast in the upper outer left breast. The patient also has left
shoulder pain and believes that her pain may be originating from the
shoulder. She does not palpate any breast lumps. Annual exam.

EXAM:
2D DIGITAL DIAGNOSTIC BILATERAL MAMMOGRAM WITH CAD AND ADJUNCT TOMO

[L CV]
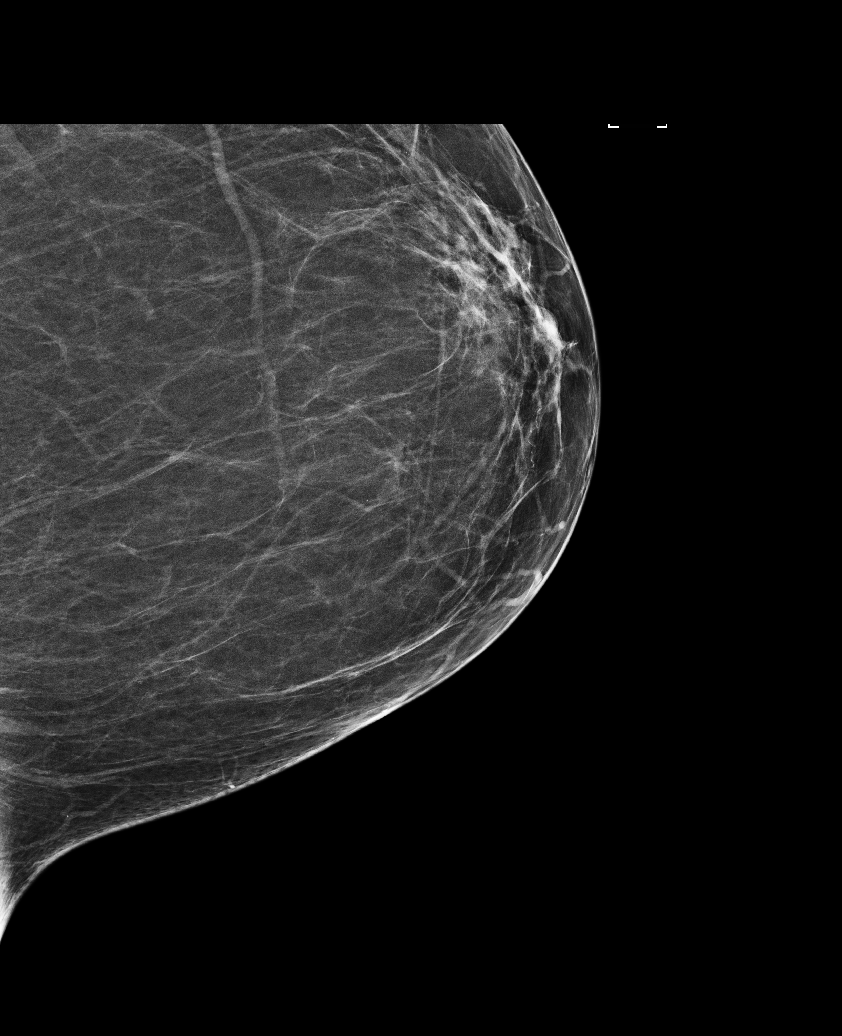

[L TAN]
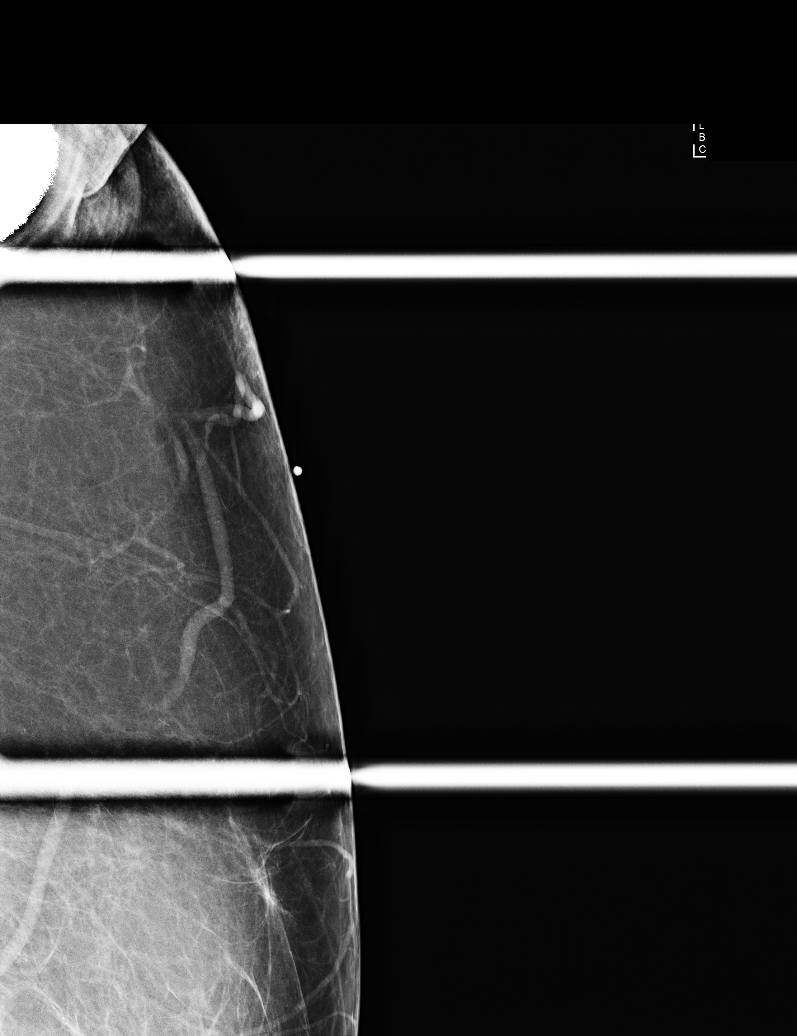

[L MLO]
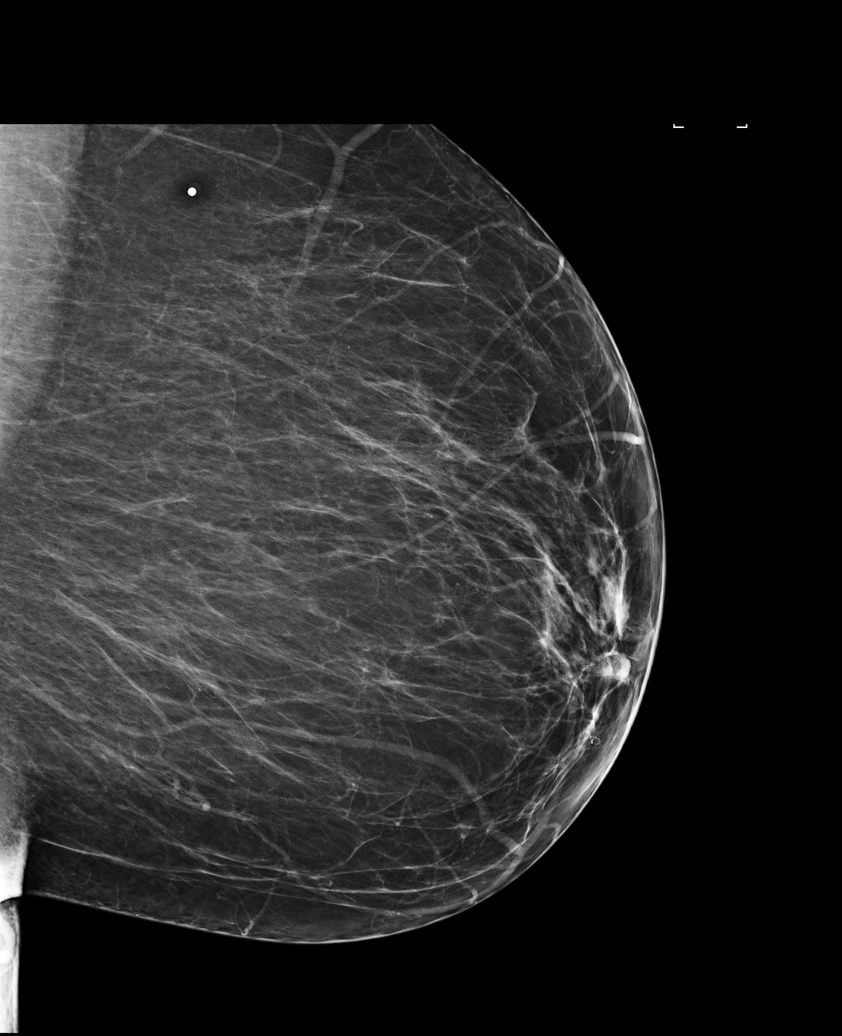

[L CC]
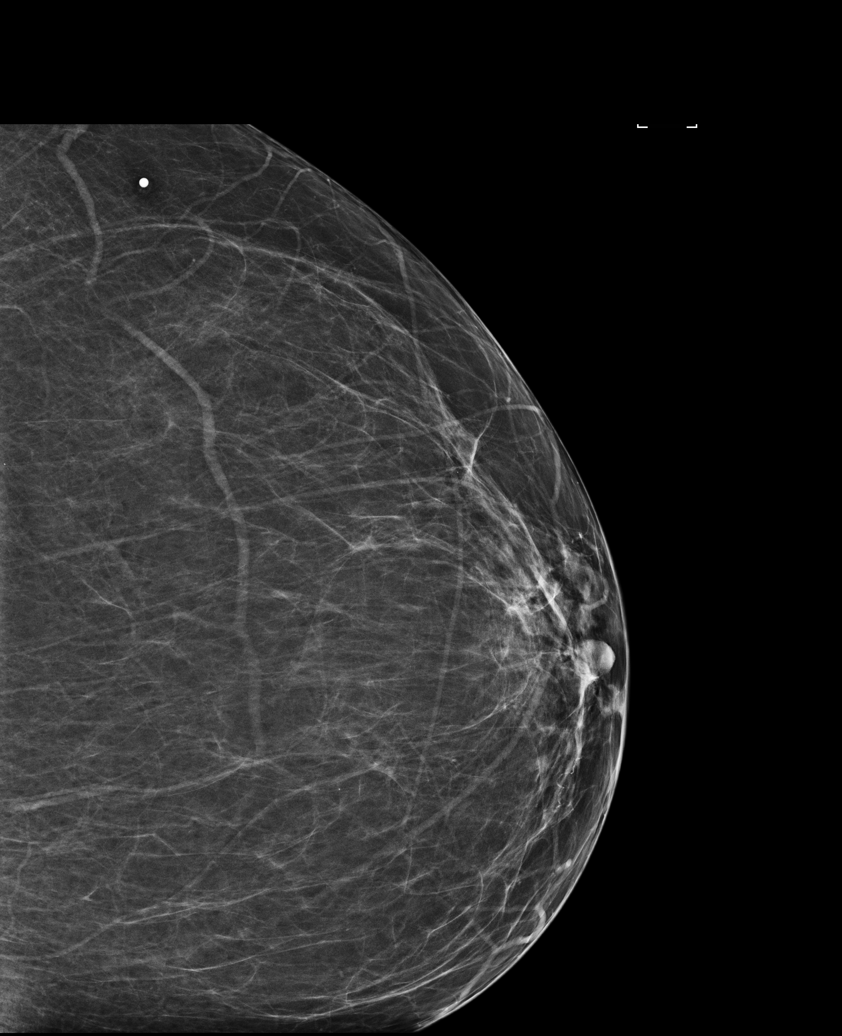

[R CC]
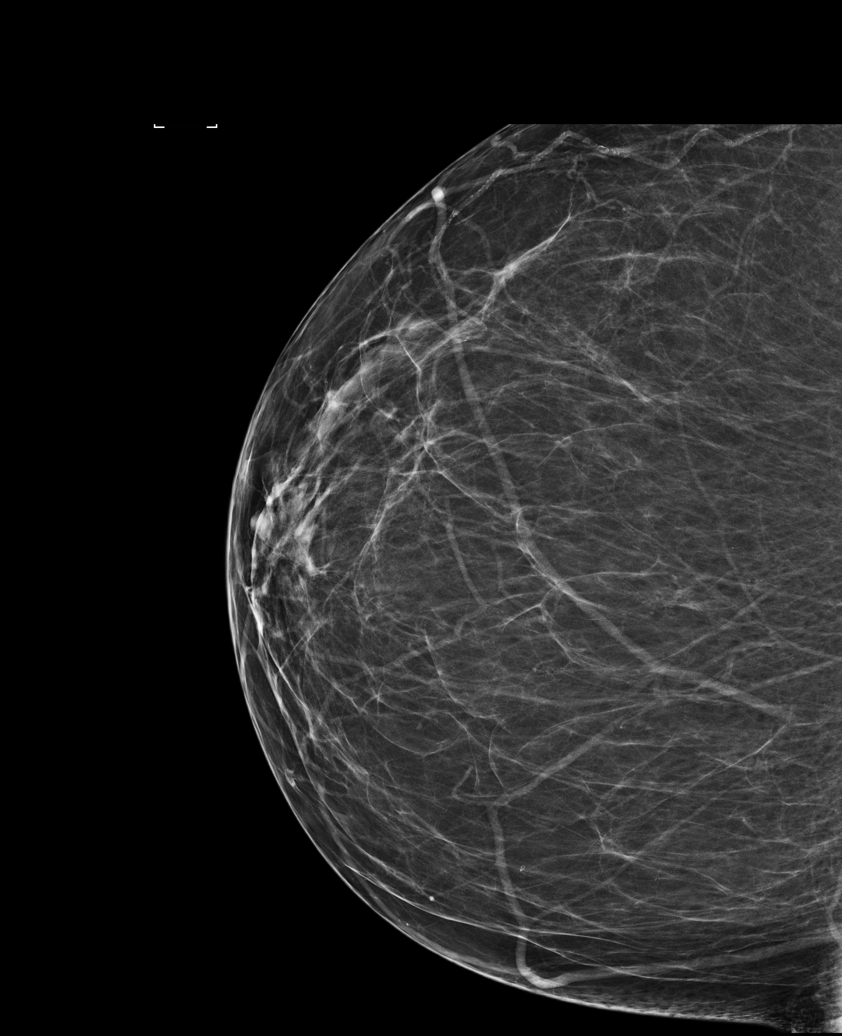

[R MLO]
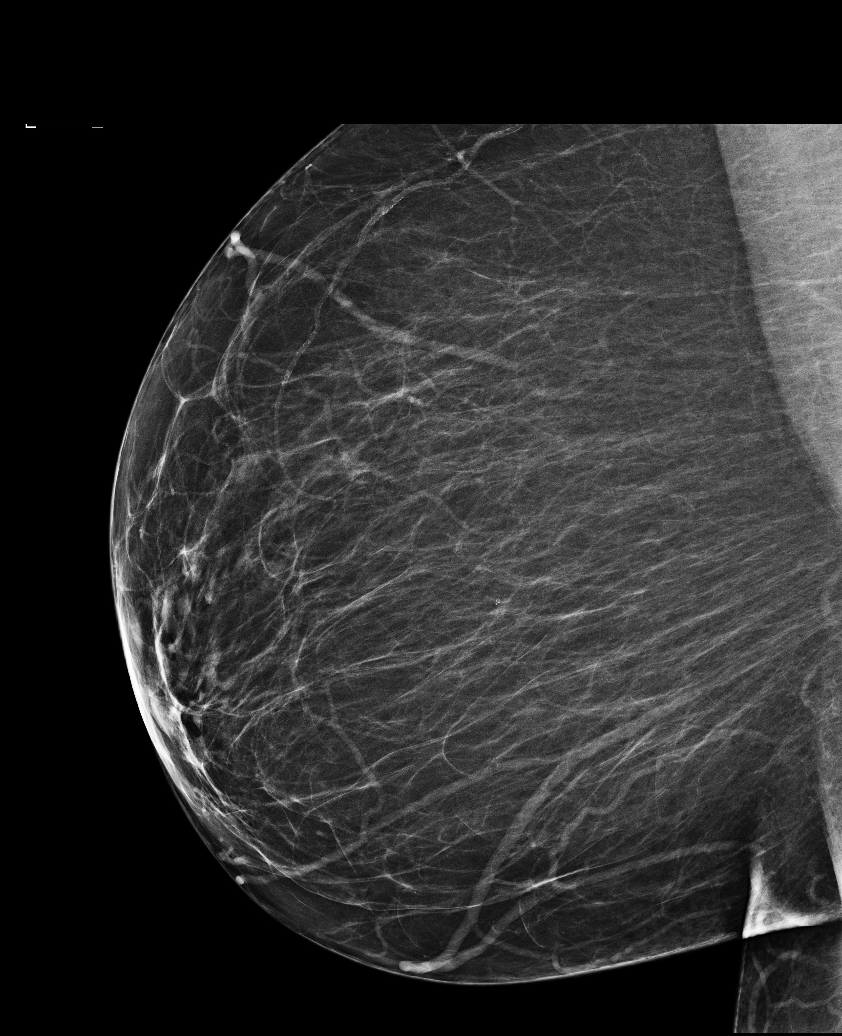

[6 of 26 positions shown; findings below may reference images not displayed]

FINDINGS: No mass, distortion, or suspicious microcalcification is identified
in either breast to suggest malignancy.

Mammographic images were processed with CAD.
IMPRESSION: No evidence of malignancy in either breast.

RECOMMENDATION:
Screening mammogram in one year.(Code:[id number]-[id number])

I have discussed the findings and recommendations with the patient.
Results were also provided in writing at the conclusion of the
visit. If applicable, a reminder letter will be sent to the patient
regarding the next appointment.

BI-RADS CATEGORY  1: Negative.

## 2017-02-28 ENCOUNTER — Encounter: Payer: Medicare HMO | Admitting: Physical Therapy

## 2017-03-01 ENCOUNTER — Telehealth: Payer: Self-pay | Admitting: *Deleted

## 2017-03-01 NOTE — Telephone Encounter (Signed)
Received call from patient.   Requested refill on Oxycodone.   Ok to refill??  Last office visit 01/05/2017.  Last refill 01/31/2017.

## 2017-03-02 DIAGNOSIS — R69 Illness, unspecified: Secondary | ICD-10-CM | POA: Diagnosis not present

## 2017-03-02 MED ORDER — OXYCODONE-ACETAMINOPHEN 7.5-325 MG PO TABS: 1.0000 | ORAL_TABLET | Freq: Four times a day (QID) | ORAL | 0 refills | Status: DC | PRN

## 2017-03-02 NOTE — Telephone Encounter (Signed)
okay

## 2017-03-02 NOTE — Telephone Encounter (Signed)
Prescription printed and patient made aware to come to office to pick up after 4pm on 03/02/2017.  

## 2017-03-23 ENCOUNTER — Other Ambulatory Visit: Payer: Self-pay | Admitting: Family Medicine

## 2017-03-23 NOTE — Telephone Encounter (Signed)
Refill appropriate 

## 2017-03-30 ENCOUNTER — Telehealth: Payer: Self-pay | Admitting: *Deleted

## 2017-03-30 NOTE — Telephone Encounter (Signed)
Okay to refill? 

## 2017-03-30 NOTE — Telephone Encounter (Signed)
Received call from patient.   Requested refill on Oxycodone.   Ok to refill??  Last office visit 01/05/2017.  Last refill 03/02/2017.

## 2017-04-02 MED ORDER — OXYCODONE-ACETAMINOPHEN 7.5-325 MG PO TABS: 1.0000 | ORAL_TABLET | Freq: Four times a day (QID) | ORAL | 0 refills | Status: DC | PRN

## 2017-04-02 NOTE — Telephone Encounter (Signed)
Prescription printed and patient made aware to come to office to pick up on 04/02/2017.

## 2017-04-04 DIAGNOSIS — R69 Illness, unspecified: Secondary | ICD-10-CM | POA: Diagnosis not present

## 2017-04-06 ENCOUNTER — Ambulatory Visit: Payer: Self-pay | Admitting: Family Medicine

## 2017-04-13 ENCOUNTER — Ambulatory Visit: Payer: Self-pay | Admitting: Family Medicine

## 2017-04-30 ENCOUNTER — Other Ambulatory Visit: Payer: Self-pay | Admitting: Family Medicine

## 2017-04-30 NOTE — Telephone Encounter (Signed)
Pt needs refill on oxycodone.  °

## 2017-04-30 NOTE — Telephone Encounter (Signed)
LRF 04/02/17  #100  LOV 01/05/17  OK refill?

## 2017-04-30 NOTE — Telephone Encounter (Signed)
okay

## 2017-05-01 DIAGNOSIS — R69 Illness, unspecified: Secondary | ICD-10-CM | POA: Diagnosis not present

## 2017-05-01 MED ORDER — OXYCODONE-ACETAMINOPHEN 7.5-325 MG PO TABS: 1.0000 | ORAL_TABLET | Freq: Four times a day (QID) | ORAL | 0 refills | Status: DC | PRN

## 2017-05-01 NOTE — Telephone Encounter (Signed)
Prescription printed and patient made aware to come to office to pick up per VM.  

## 2017-05-23 ENCOUNTER — Other Ambulatory Visit: Payer: Self-pay | Admitting: *Deleted

## 2017-05-23 MED ORDER — OXYCODONE-ACETAMINOPHEN 7.5-325 MG PO TABS: 1.0000 | ORAL_TABLET | Freq: Four times a day (QID) | ORAL | 0 refills | Status: DC | PRN

## 2017-05-23 NOTE — Telephone Encounter (Signed)
Patient in office to pick up sample of insulin.   Requested refill on Oxycodone.   Prescription due on 05/31/2017, but patient is requesting to post date it so that she does not have to make 2 trips.   Prescription printed and postdated.

## 2017-05-31 DIAGNOSIS — R69 Illness, unspecified: Secondary | ICD-10-CM | POA: Diagnosis not present

## 2017-06-12 ENCOUNTER — Other Ambulatory Visit: Payer: Medicare HMO

## 2017-06-13 ENCOUNTER — Other Ambulatory Visit: Payer: Medicare HMO

## 2017-06-14 ENCOUNTER — Other Ambulatory Visit: Payer: Medicare HMO

## 2017-06-14 DIAGNOSIS — E785 Hyperlipidemia, unspecified: Secondary | ICD-10-CM

## 2017-06-14 DIAGNOSIS — I10 Essential (primary) hypertension: Secondary | ICD-10-CM

## 2017-06-14 DIAGNOSIS — E119 Type 2 diabetes mellitus without complications: Secondary | ICD-10-CM | POA: Diagnosis not present

## 2017-06-14 DIAGNOSIS — E1100 Type 2 diabetes mellitus with hyperosmolarity without nonketotic hyperglycemic-hyperosmolar coma (NKHHC): Secondary | ICD-10-CM

## 2017-06-15 LAB — LIPID PANEL
CHOLESTEROL: 114 mg/dL (ref <200)
HDL: 38 mg/dL — AB (ref >50)
LDL Cholesterol (Calc): 58 mg/dL (calc)
Non-HDL Cholesterol (Calc): 76 mg/dL (calc) (ref <130)
TRIGLYCERIDES: 95 mg/dL (ref <150)
Total CHOL/HDL Ratio: 3 (calc) (ref <5.0)

## 2017-06-15 LAB — MICROALBUMIN / CREATININE URINE RATIO
CREATININE, URINE: 62 mg/dL (ref 20–275)
Microalb, Ur: <0.2 mg/dL

## 2017-06-15 LAB — COMPLETE METABOLIC PANEL WITH GFR
AG RATIO: 1.3 (calc) (ref 1.0–2.5)
ALKALINE PHOSPHATASE (APISO): 109 U/L (ref 33–130)
ALT: 12 U/L (ref 6–29)
AST: 14 U/L (ref 10–35)
Albumin: 4 g/dL (ref 3.6–5.1)
BUN: 14 mg/dL (ref 7–25)
CHLORIDE: 105 mmol/L (ref 98–110)
CO2: 26 mmol/L (ref 20–32)
Calcium: 9.2 mg/dL (ref 8.6–10.4)
Creat: 0.66 mg/dL (ref 0.50–0.99)
GFR, Est African American: 108 mL/min/{1.73_m2} (ref >=60)
GFR, Est Non African American: 93 mL/min/{1.73_m2} (ref >=60)
GLOBULIN: 3.1 g/dL (ref 1.9–3.7)
Glucose, Bld: 104 mg/dL — ABNORMAL HIGH (ref 65–99)
POTASSIUM: 4.8 mmol/L (ref 3.5–5.3)
SODIUM: 139 mmol/L (ref 135–146)
Total Bilirubin: 0.4 mg/dL (ref 0.2–1.2)
Total Protein: 7.1 g/dL (ref 6.1–8.1)

## 2017-06-15 LAB — CBC WITH DIFFERENTIAL/PLATELET
BASOS ABS: 91 {cells}/uL (ref 0–200)
Basophils Relative: 1.3 %
EOS PCT: 2.7 %
Eosinophils Absolute: 189 cells/uL (ref 15–500)
HEMATOCRIT: 38.3 % (ref 35.0–45.0)
Hemoglobin: 12.7 g/dL (ref 11.7–15.5)
LYMPHS ABS: 1960 {cells}/uL (ref 850–3900)
MCH: 30 pg (ref 27.0–33.0)
MCHC: 33.2 g/dL (ref 32.0–36.0)
MCV: 90.5 fL (ref 80.0–100.0)
MPV: 9.9 fL (ref 7.5–12.5)
Monocytes Relative: 7.3 %
NEUTROS PCT: 60.7 %
Neutro Abs: 4249 cells/uL (ref 1500–7800)
PLATELETS: 388 10*3/uL (ref 140–400)
RBC: 4.23 10*6/uL (ref 3.80–5.10)
RDW: 13.4 % (ref 11.0–15.0)
TOTAL LYMPHOCYTE: 28 %
WBC mixed population: 511 cells/uL (ref 200–950)
WBC: 7 10*3/uL (ref 3.8–10.8)

## 2017-06-15 LAB — HEMOGLOBIN A1C
Hgb A1c MFr Bld: 5.5 % of total Hgb (ref <5.7)
Mean Plasma Glucose: 111 (calc)
eAG (mmol/L): 6.2 (calc)

## 2017-06-18 ENCOUNTER — Other Ambulatory Visit: Payer: Self-pay | Admitting: Family Medicine

## 2017-06-19 ENCOUNTER — Other Ambulatory Visit: Payer: Self-pay | Admitting: Family Medicine

## 2017-06-19 ENCOUNTER — Other Ambulatory Visit: Payer: Self-pay

## 2017-06-19 ENCOUNTER — Ambulatory Visit (INDEPENDENT_AMBULATORY_CARE_PROVIDER_SITE_OTHER): Payer: Medicare HMO | Admitting: Family Medicine

## 2017-06-19 ENCOUNTER — Encounter: Payer: Self-pay | Admitting: Family Medicine

## 2017-06-19 VITALS — BP 130/78 | HR 96 | Temp 98.1°F | Resp 16 | Ht 63.0 in | Wt 213.0 lb

## 2017-06-19 DIAGNOSIS — Z0000 Encounter for general adult medical examination without abnormal findings: Secondary | ICD-10-CM | POA: Diagnosis not present

## 2017-06-19 DIAGNOSIS — Z794 Long term (current) use of insulin: Secondary | ICD-10-CM | POA: Diagnosis not present

## 2017-06-19 DIAGNOSIS — I2510 Atherosclerotic heart disease of native coronary artery without angina pectoris: Secondary | ICD-10-CM | POA: Diagnosis not present

## 2017-06-19 DIAGNOSIS — Z13820 Encounter for screening for osteoporosis: Secondary | ICD-10-CM

## 2017-06-19 DIAGNOSIS — R69 Illness, unspecified: Secondary | ICD-10-CM | POA: Diagnosis not present

## 2017-06-19 DIAGNOSIS — I10 Essential (primary) hypertension: Secondary | ICD-10-CM | POA: Diagnosis not present

## 2017-06-19 DIAGNOSIS — K580 Irritable bowel syndrome with diarrhea: Secondary | ICD-10-CM

## 2017-06-19 DIAGNOSIS — Z1159 Encounter for screening for other viral diseases: Secondary | ICD-10-CM | POA: Diagnosis not present

## 2017-06-19 DIAGNOSIS — E1141 Type 2 diabetes mellitus with diabetic mononeuropathy: Secondary | ICD-10-CM | POA: Diagnosis not present

## 2017-06-19 DIAGNOSIS — E1140 Type 2 diabetes mellitus with diabetic neuropathy, unspecified: Secondary | ICD-10-CM | POA: Diagnosis not present

## 2017-06-19 DIAGNOSIS — Z114 Encounter for screening for human immunodeficiency virus [HIV]: Secondary | ICD-10-CM

## 2017-06-19 MED ORDER — METFORMIN HCL ER 500 MG PO TB24: ORAL_TABLET | ORAL | 2 refills | Status: DC

## 2017-06-19 MED ORDER — INSULIN DEGLUDEC 100 UNIT/ML ~~LOC~~ SOPN: 10.0000 [IU] | PEN_INJECTOR | Freq: Every day | SUBCUTANEOUS | 2 refills | Status: DC

## 2017-06-19 NOTE — Patient Instructions (Addendum)
Decrease Tresiba 10units  Decrease metformin to 1 tablet daily  Probiotics- over the counter  We will call with lab results  Schedule mammogram Schedule bone density  Increase water  Decrease caffiene Try B12 1014mcg once a day  F/U 3 months

## 2017-06-19 NOTE — Progress Notes (Signed)
Subjective:   Patient presents for Medicare Annual/Subsequent preventive examination.    Pt here for wellnes visit   DM- A1C 5.5%, taking 15 units of Tresiba, taking metformin, no hypoglycemia CBG ranging 140-228 Continues to get tingling and numbness episodes in feet on and off, has known diabetic neuropathy,    Hyperlipidemia- on zocor 10mg    Known IBS- always looser stools, but seems worse lately, no blood in stools, she used to be more bloated and constipated  Gets headahes feels like "birds chirping". No change in vision, also occurs when sugar spikes, drinks caffiene 1-2 p[ots of coffee a day   Review Past Medical/Family/Social: Per EMR    Risk Factors  Current exercise habits: none Dietary issues discussed: Yes  Cardiac risk factors: Obesity (BMI >= 30 kg/m2). DM ,CAD Depression Screen - See PHQ-9  (Note: if answer to either of the following is "Yes", a more complete depression screening is indicated)  Over the past two weeks, have you felt down, depressed or hopeless?Yes Over the past two weeks, have you felt little interest or pleasure in doing things? Yes Have you lost interest or pleasure in daily life? No Do you often feel hopeless? No Do you cry easily over simple problems? No   Activities of Daily Living  In your present state of health, do you have any difficulty performing the following activities?:  Driving? No  Managing money? No  Feeding yourself? No  Getting from bed to chair? yes  Climbing a flight of stairs? No  Preparing food and eating?: No  Bathing or showering? No  Getting dressed: No  Getting to the toilet? No  Using the toilet:No  Moving around from place to place: No  In the past year have you fallen or had a near fall?yes   Are you sexually active? No  Do you have more than one partner? No   Hearing Difficulties: No  Do you often ask people to speak up or repeat themselves? No  Do you experience ringing or noises in your ears? No Do you  have difficulty understanding soft or whispered voices? No  Do you feel that you have a problem with memory? No Do you often misplace items? No  Do you feel safe at home? Yes  Cognitive Testing  Alert? Yes Normal Appearance?Yes  Oriented to person? Yes Place? Yes  Time? Yes  Recall of three objects? Yes  Can perform simple calculations? Yes  Displays appropriate judgment?Yes  Can read the correct time from a watch face?Yes   List the Names of Other Physician/Practitioners you currently use:    [facility]- [place], appointment in January        Screening Tests / Date Colonoscopy   UTD                  Zostavax - Due patient interested  Mammogram - Due in Dec 2018 Bone Density- Due  Influenza Vaccine  Tetanus/tdap  GEN- denies fatigue, fever, weight loss,weakness, recent illness HEENT- denies eye drainage, change in vision, nasal discharge, CVS- denies chest pain, palpitations RESP- denies SOB, cough, wheeze ABD- denies N/V, change in stools, abd pain GU- denies dysuria, hematuria, dribbling, incontinence MSK- + joint pain, muscle aches, injury Neuro- denies headache, dizziness, syncope, seizure activity  PHYSICAL: GEN- NAD, alert and oriented x3 HEENT- PERRL, EOMI, non injected sclera, pink conjunctiva, MMM, oropharynx clear Neck- Supple, no thryomegaly, no bruit CVS- RRR, no murmur RESP-CTAB ABD- NABS,soft, NT,ND Psych- normal affect and mood EXT-  No edema Neuro-CNII-XII in tact no focal deficits  Pulses- Radial, DP- 2+    Assessment:    Annual wellness medicare exam   Plan:    During the course of the visit the patient was educated and counseled about appropriate screening and preventive services including:  Screening mammography  Bone Density- to be done Declines immunizations except shingrix   Hep C/HIV screen  Screen + for depression. PHQ- 9 score of  10- she has chronic pain, contributing to her mood, fatigue, difficulty with bowels. Will not  start medications  DM- A1C for most part is always normal, yet she gets significant spikes in sugar, decrease Tresiba to 10 units  diabetic neuropathy- try B12 1085mcg once a day   Headaches- discussed increasing water, slowly cutting down the caffiene  IBS- start probiotics, decrease metformin to 500mg  once a day as well   CAD- asymptomatic but multiple risk factors, cholesterol at goal   Eye exam scheduled   Diet review for nutrition referral? Yes ____ Not Indicated __x__  Patient Instructions (the written plan) was given to the patient.  Medicare Attestation  I have personally reviewed:  The patient's medical and social history  Their use of alcohol, tobacco or illicit drugs  Their current medications and supplements  The patient's functional ability including ADLs,fall risks, home safety risks, cognitive, and hearing and visual impairment  Diet and physical activities  Evidence for depression or mood disorders  The patient's weight, height, BMI, and visual acuity have been recorded in the chart. I have made referrals, counseling, and provided education to the patient based on review of the above and I have provided the patient with a written personalized care plan for preventive services.

## 2017-06-20 ENCOUNTER — Other Ambulatory Visit: Payer: Self-pay | Admitting: Family Medicine

## 2017-06-20 ENCOUNTER — Encounter: Payer: Self-pay | Admitting: Family Medicine

## 2017-06-20 DIAGNOSIS — Z780 Asymptomatic menopausal state: Secondary | ICD-10-CM

## 2017-06-20 LAB — HEPATITIS C ANTIBODY
HEP C AB: NONREACTIVE
SIGNAL TO CUT-OFF: 0.01 (ref <1.00)

## 2017-06-20 LAB — HIV ANTIBODY (ROUTINE TESTING W REFLEX): HIV 1&2 Ab, 4th Generation: NONREACTIVE

## 2017-07-02 ENCOUNTER — Other Ambulatory Visit (HOSPITAL_COMMUNITY): Payer: Medicare HMO

## 2017-07-03 ENCOUNTER — Telehealth: Payer: Self-pay | Admitting: *Deleted

## 2017-07-03 DIAGNOSIS — R69 Illness, unspecified: Secondary | ICD-10-CM | POA: Diagnosis not present

## 2017-07-03 MED ORDER — OXYCODONE-ACETAMINOPHEN 7.5-325 MG PO TABS: 1.0000 | ORAL_TABLET | Freq: Four times a day (QID) | ORAL | 0 refills | Status: DC | PRN

## 2017-07-03 NOTE — Telephone Encounter (Signed)
Okay to refill? 

## 2017-07-03 NOTE — Telephone Encounter (Signed)
Prescription printed and patient made aware to come to office to pick up after 3pm on 07/03/2017.

## 2017-07-03 NOTE — Telephone Encounter (Signed)
Received call from patient.   Requested refill on Oxycodone.   Ok to refill??  Last office visit 06/19/2017.  Last refill 05/31/2017.

## 2017-07-09 ENCOUNTER — Other Ambulatory Visit (HOSPITAL_COMMUNITY): Payer: Medicare HMO

## 2017-07-09 ENCOUNTER — Other Ambulatory Visit: Payer: Self-pay | Admitting: Family Medicine

## 2017-07-12 ENCOUNTER — Other Ambulatory Visit: Payer: Self-pay | Admitting: Family Medicine

## 2017-07-12 ENCOUNTER — Other Ambulatory Visit: Payer: Self-pay | Admitting: *Deleted

## 2017-07-12 DIAGNOSIS — Z1231 Encounter for screening mammogram for malignant neoplasm of breast: Secondary | ICD-10-CM

## 2017-07-12 MED ORDER — LOSARTAN POTASSIUM 100 MG PO TABS: 100.0000 mg | ORAL_TABLET | Freq: Every day | ORAL | 1 refills | Status: DC

## 2017-07-17 ENCOUNTER — Other Ambulatory Visit: Payer: Self-pay | Admitting: Family Medicine

## 2017-07-23 ENCOUNTER — Ambulatory Visit (HOSPITAL_COMMUNITY): Payer: Medicare HMO

## 2017-07-23 ENCOUNTER — Other Ambulatory Visit (HOSPITAL_COMMUNITY): Payer: Medicare HMO

## 2017-07-24 ENCOUNTER — Other Ambulatory Visit (HOSPITAL_COMMUNITY): Payer: Medicare HMO

## 2017-07-24 ENCOUNTER — Encounter: Payer: Self-pay | Admitting: Family Medicine

## 2017-07-29 ENCOUNTER — Other Ambulatory Visit: Payer: Self-pay | Admitting: Family Medicine

## 2017-08-02 ENCOUNTER — Other Ambulatory Visit: Payer: Self-pay | Admitting: *Deleted

## 2017-08-02 NOTE — Telephone Encounter (Signed)
Received call from patient.   Requested refill on Percocet.   Ok to refill??  Last office visit 06/19/2017.  Last refill 07/03/2017.

## 2017-08-03 MED ORDER — OXYCODONE-ACETAMINOPHEN 7.5-325 MG PO TABS: 1.0000 | ORAL_TABLET | Freq: Four times a day (QID) | ORAL | 0 refills | Status: DC | PRN

## 2017-08-03 NOTE — Telephone Encounter (Signed)
Ok, did not give me option to escribe

## 2017-08-03 NOTE — Telephone Encounter (Signed)
Prescription pended.

## 2017-08-08 ENCOUNTER — Encounter: Payer: Self-pay | Admitting: Family Medicine

## 2017-08-08 ENCOUNTER — Ambulatory Visit: Payer: Self-pay

## 2017-08-13 ENCOUNTER — Ambulatory Visit (HOSPITAL_COMMUNITY)
Admission: RE | Admit: 2017-08-13 | Discharge: 2017-08-13 | Disposition: A | Discharge status: Home / Self Care | Payer: Medicare HMO | Source: Ambulatory Visit | Attending: Family Medicine | Admitting: Family Medicine

## 2017-08-13 DIAGNOSIS — M85851 Other specified disorders of bone density and structure, right thigh: Secondary | ICD-10-CM | POA: Insufficient documentation

## 2017-08-13 DIAGNOSIS — M8588 Other specified disorders of bone density and structure, other site: Secondary | ICD-10-CM | POA: Insufficient documentation

## 2017-08-13 DIAGNOSIS — M8589 Other specified disorders of bone density and structure, multiple sites: Secondary | ICD-10-CM | POA: Diagnosis not present

## 2017-08-13 DIAGNOSIS — Z1231 Encounter for screening mammogram for malignant neoplasm of breast: Secondary | ICD-10-CM

## 2017-08-13 DIAGNOSIS — Z780 Asymptomatic menopausal state: Secondary | ICD-10-CM | POA: Diagnosis not present

## 2017-08-14 ENCOUNTER — Encounter: Payer: Self-pay | Admitting: *Deleted

## 2017-08-30 ENCOUNTER — Other Ambulatory Visit: Payer: Self-pay | Admitting: *Deleted

## 2017-08-30 NOTE — Telephone Encounter (Signed)
Received call from patient.   Requested refill on Oxycodone/APAP.   Ok to refill??  Last office visit 06/19/2017.  Last refill 08/03/2017.

## 2017-08-31 MED ORDER — OXYCODONE-ACETAMINOPHEN 7.5-325 MG PO TABS: 1.0000 | ORAL_TABLET | Freq: Four times a day (QID) | ORAL | 0 refills | Status: DC | PRN

## 2017-09-03 ENCOUNTER — Other Ambulatory Visit: Payer: Self-pay

## 2017-09-03 ENCOUNTER — Ambulatory Visit (INDEPENDENT_AMBULATORY_CARE_PROVIDER_SITE_OTHER): Payer: Medicare HMO | Admitting: *Deleted

## 2017-09-03 DIAGNOSIS — Z111 Encounter for screening for respiratory tuberculosis: Secondary | ICD-10-CM | POA: Diagnosis not present

## 2017-09-05 ENCOUNTER — Encounter: Payer: Self-pay | Admitting: *Deleted

## 2017-09-05 ENCOUNTER — Ambulatory Visit (INDEPENDENT_AMBULATORY_CARE_PROVIDER_SITE_OTHER): Payer: Medicare HMO | Admitting: *Deleted

## 2017-09-05 DIAGNOSIS — Z111 Encounter for screening for respiratory tuberculosis: Secondary | ICD-10-CM | POA: Diagnosis not present

## 2017-09-05 LAB — TB SKIN TEST
Induration: 0 mm
TB SKIN TEST: NEGATIVE

## 2017-09-05 NOTE — Progress Notes (Signed)
Patient in office to have PPD read.   No redness or induration noted.   Read as negative.

## 2017-09-10 ENCOUNTER — Ambulatory Visit: Payer: Medicare HMO | Admitting: Family Medicine

## 2017-09-10 ENCOUNTER — Other Ambulatory Visit: Payer: Self-pay | Admitting: *Deleted

## 2017-09-10 ENCOUNTER — Ambulatory Visit (INDEPENDENT_AMBULATORY_CARE_PROVIDER_SITE_OTHER): Payer: Medicare HMO | Admitting: Family Medicine

## 2017-09-10 ENCOUNTER — Encounter: Payer: Self-pay | Admitting: Family Medicine

## 2017-09-10 ENCOUNTER — Other Ambulatory Visit: Payer: Self-pay

## 2017-09-10 VITALS — BP 136/62 | HR 114 | Temp 98.4°F | Resp 14 | Ht 63.0 in | Wt 214.0 lb

## 2017-09-10 DIAGNOSIS — I2510 Atherosclerotic heart disease of native coronary artery without angina pectoris: Secondary | ICD-10-CM | POA: Diagnosis not present

## 2017-09-10 DIAGNOSIS — R519 Headache, unspecified: Secondary | ICD-10-CM

## 2017-09-10 DIAGNOSIS — E1140 Type 2 diabetes mellitus with diabetic neuropathy, unspecified: Secondary | ICD-10-CM | POA: Diagnosis not present

## 2017-09-10 DIAGNOSIS — E1141 Type 2 diabetes mellitus with diabetic mononeuropathy: Secondary | ICD-10-CM | POA: Diagnosis not present

## 2017-09-10 DIAGNOSIS — Z794 Long term (current) use of insulin: Secondary | ICD-10-CM | POA: Diagnosis not present

## 2017-09-10 DIAGNOSIS — R000 Tachycardia, unspecified: Secondary | ICD-10-CM | POA: Diagnosis not present

## 2017-09-10 DIAGNOSIS — I10 Essential (primary) hypertension: Secondary | ICD-10-CM

## 2017-09-10 DIAGNOSIS — R51 Headache: Secondary | ICD-10-CM

## 2017-09-10 DIAGNOSIS — Z720 Tobacco use: Secondary | ICD-10-CM

## 2017-09-10 DIAGNOSIS — G8929 Other chronic pain: Secondary | ICD-10-CM

## 2017-09-10 MED ORDER — METOPROLOL SUCCINATE ER 25 MG PO TB24: 12.5000 mg | ORAL_TABLET | Freq: Every day | ORAL | 3 refills | Status: DC

## 2017-09-10 MED ORDER — NICOTINE 21 MG/24HR TD PT24: 21.0000 mg | MEDICATED_PATCH | Freq: Every day | TRANSDERMAL | 0 refills | Status: DC

## 2017-09-10 MED ORDER — OMEPRAZOLE 20 MG PO CPDR: DELAYED_RELEASE_CAPSULE | ORAL | 0 refills | Status: DC

## 2017-09-10 NOTE — Assessment & Plan Note (Signed)
Pressure looks good however she states she is having episodes where her blood pressure spiking at home.  She is max on the losartan as well as the amlodipine.  Should she have some tachycardic episodes along with some headaches which I think she has a significant amount of caffeine induced headaches as she drinks upwards of 10-12 cups of coffee a day.  There is no neurological deficits noted on exam.  Continue to have instructed her to wean off of the coffee.  We will start metoprolol 12.5 mg once a day Check metabolic labs

## 2017-09-10 NOTE — Assessment & Plan Note (Signed)
Well controlled but her fastings and home readings are often higher than the correlated A1C Check A1C today  Continue Tresiba 10 units Metformin 500mg  daily

## 2017-09-10 NOTE — Patient Instructions (Signed)
I will call with lab results Start the Toprl 12.5mg  once a day  Referral to cardiology  nicoderm patch given F/U 3 months

## 2017-09-10 NOTE — Assessment & Plan Note (Signed)
NO chest pain tachycardia noted today  EKG

## 2017-09-10 NOTE — Assessment & Plan Note (Signed)
Trial of of nicoderm patch

## 2017-09-10 NOTE — Progress Notes (Signed)
Subjective:    Patient ID: Stephanie Hammond, female    DOB: April 01, 1951, 67 y.o.   MRN: [id number]  Patient presents for Follow-up (is not fasting)  Here to follow-up chronic medical problems.  Medications reviewed  Diabetes mellitus her last A1c was 5.5% she is currently to [place] which I decreased to 10 units in November Lipids are at goal LDL 58, metformin decreased to 500mg  once a day at that visit as well  CBG range- did not bring meter, this morning was  155  CAD- no current symptoms   Had headache this weekend, took BP was 159/89. Once BP came down she felt better  Typically has headache 2-3times a week, feels a roaring as well  She drinks coffee 1 pot in the morning, and 2 cups in the evening    Tobacco use- smokes 10 cig or more a day, wants to try the patch        Review Of Systems:  GEN- denies fatigue, fever, weight loss,weakness, recent illness HEENT- denies eye drainage, change in vision, nasal discharge, CVS- denies chest pain, palpitations RESP- denies SOB, cough, wheeze ABD- denies N/V, change in stools, abd pain GU- denies dysuria, hematuria, dribbling, incontinence MSK- denies joint pain, muscle aches, injury Neuro- denies headache, dizziness, syncope, seizure activity       Objective:    BP 136/62   Pulse (!) 114   Temp 98.4 F (36.9 C) (Oral)   Resp 14   Ht 5\' 3"  (1.6 m)   Wt 214 lb (97.1 kg)   SpO2 94%   BMI 37.91 kg/m  GEN- NAD, alert and oriented x3 HEENT- PERRL, EOMI, non injected sclera, pink conjunctiva, MMM, oropharynx clear Neck- Supple, no thyromegaly CVS- tachycardic, normal rhythem, no murmur RESP-CTAB ABD-NABS,soft,NT,ND EXT- No edema NEURO-CNII-XII in tact, no deficits Pulses- Radial, DP- 2+  EKG- Sinus tachycardia, poor r wave progression      Assessment & Plan:      Problem List Items Addressed This Visit      Unprioritized   Diabetic neuropathy (HCC)   Type 2 diabetes mellitus with diabetic neuropathy (HCC)  - Primary    Well controlled but her fastings and home readings are often higher than the correlated A1C Check A1C today  Continue Tresiba 10 units Metformin 500mg  daily      Relevant Orders   CBC with Differential/Platelet   Comprehensive metabolic panel   Hemoglobin A1c   Tobacco user    Trial of of nicoderm patch      Essential hypertension, benign    Pressure looks good however she states she is having episodes where her blood pressure spiking at home.  She is max on the losartan as well as the amlodipine.  Should she have some tachycardic episodes along with some headaches which I think she has a significant amount of caffeine induced headaches as she drinks upwards of 10-12 cups of coffee a day.  There is no neurological deficits noted on exam.  Continue to have instructed her to wean off of the coffee.  We will start metoprolol 12.5 mg once a day Check metabolic labs      Relevant Medications   metoprolol succinate (TOPROL-XL) 25 MG 24 hr tablet   Other Relevant Orders   TSH   CAD (coronary artery disease)    NO chest pain tachycardia noted today  EKG       Relevant Medications   metoprolol succinate (TOPROL-XL) 25 MG 24 hr tablet  Other Visit Diagnoses    Tachycardia       EKG changes in setting of the tachycardia, will set up with cardiology    Relevant Orders   EKG 12-Lead (Completed)   TSH   Chronic nonintractable headache, unspecified headache type       Relevant Medications   metoprolol succinate (TOPROL-XL) 25 MG 24 hr tablet      Note: This dictation was prepared with Dragon dictation along with smaller phrase technology. Any transcriptional errors that result from this process are unintentional.

## 2017-09-11 LAB — COMPREHENSIVE METABOLIC PANEL
AG Ratio: 1.3 (calc) (ref 1.0–2.5)
ALT: 9 U/L (ref 6–29)
AST: 10 U/L (ref 10–35)
Albumin: 4 g/dL (ref 3.6–5.1)
Alkaline phosphatase (APISO): 104 U/L (ref 33–130)
BUN: 20 mg/dL (ref 7–25)
CHLORIDE: 105 mmol/L (ref 98–110)
CO2: 25 mmol/L (ref 20–32)
CREATININE: 0.92 mg/dL (ref 0.50–0.99)
Calcium: 9.4 mg/dL (ref 8.6–10.4)
GLOBULIN: 3.1 g/dL (ref 1.9–3.7)
GLUCOSE: 266 mg/dL — AB (ref 65–99)
Potassium: 4.3 mmol/L (ref 3.5–5.3)
Sodium: 140 mmol/L (ref 135–146)
TOTAL PROTEIN: 7.1 g/dL (ref 6.1–8.1)
Total Bilirubin: 0.2 mg/dL (ref 0.2–1.2)

## 2017-09-11 LAB — CBC WITH DIFFERENTIAL/PLATELET
BASOS PCT: 1.2 %
Basophils Absolute: 82 cells/uL (ref 0–200)
EOS ABS: 190 {cells}/uL (ref 15–500)
EOS PCT: 2.8 %
HCT: 37.1 % (ref 35.0–45.0)
Hemoglobin: 12.6 g/dL (ref 11.7–15.5)
Lymphs Abs: 1890 cells/uL (ref 850–3900)
MCH: 31 pg (ref 27.0–33.0)
MCHC: 34 g/dL (ref 32.0–36.0)
MCV: 91.2 fL (ref 80.0–100.0)
MONOS PCT: 5.3 %
MPV: 10.7 fL (ref 7.5–12.5)
Neutro Abs: 4277 cells/uL (ref 1500–7800)
Neutrophils Relative %: 62.9 %
PLATELETS: 370 10*3/uL (ref 140–400)
RBC: 4.07 10*6/uL (ref 3.80–5.10)
RDW: 13.9 % (ref 11.0–15.0)
TOTAL LYMPHOCYTE: 27.8 %
WBC mixed population: 360 cells/uL (ref 200–950)
WBC: 6.8 10*3/uL (ref 3.8–10.8)

## 2017-09-11 LAB — HEMOGLOBIN A1C
EAG (MMOL/L): 7 (calc)
Hgb A1c MFr Bld: 6 % of total Hgb — ABNORMAL HIGH (ref <5.7)
MEAN PLASMA GLUCOSE: 126 (calc)

## 2017-09-11 LAB — TSH: TSH: 0.75 mIU/L (ref 0.40–4.50)

## 2017-09-13 ENCOUNTER — Encounter: Payer: Self-pay | Admitting: *Deleted

## 2017-09-19 ENCOUNTER — Other Ambulatory Visit: Payer: Self-pay | Admitting: Family Medicine

## 2017-09-20 ENCOUNTER — Other Ambulatory Visit: Payer: Self-pay | Admitting: *Deleted

## 2017-09-20 DIAGNOSIS — I10 Essential (primary) hypertension: Secondary | ICD-10-CM | POA: Diagnosis not present

## 2017-09-20 DIAGNOSIS — R69 Illness, unspecified: Secondary | ICD-10-CM | POA: Diagnosis not present

## 2017-09-20 DIAGNOSIS — E109 Type 1 diabetes mellitus without complications: Secondary | ICD-10-CM | POA: Diagnosis not present

## 2017-09-20 DIAGNOSIS — H524 Presbyopia: Secondary | ICD-10-CM | POA: Diagnosis not present

## 2017-09-20 LAB — HM DIABETES EYE EXAM

## 2017-09-20 MED ORDER — LANCETS MISC: 11 refills | Status: DC

## 2017-09-24 DIAGNOSIS — M109 Gout, unspecified: Secondary | ICD-10-CM | POA: Diagnosis not present

## 2017-09-24 DIAGNOSIS — K219 Gastro-esophageal reflux disease without esophagitis: Secondary | ICD-10-CM | POA: Diagnosis not present

## 2017-09-24 DIAGNOSIS — E669 Obesity, unspecified: Secondary | ICD-10-CM | POA: Diagnosis not present

## 2017-09-24 DIAGNOSIS — E1165 Type 2 diabetes mellitus with hyperglycemia: Secondary | ICD-10-CM | POA: Diagnosis not present

## 2017-09-24 DIAGNOSIS — Z6837 Body mass index (BMI) 37.0-37.9, adult: Secondary | ICD-10-CM | POA: Diagnosis not present

## 2017-09-24 DIAGNOSIS — R69 Illness, unspecified: Secondary | ICD-10-CM | POA: Diagnosis not present

## 2017-09-24 DIAGNOSIS — G8929 Other chronic pain: Secondary | ICD-10-CM | POA: Diagnosis not present

## 2017-09-24 DIAGNOSIS — Z794 Long term (current) use of insulin: Secondary | ICD-10-CM | POA: Diagnosis not present

## 2017-09-24 DIAGNOSIS — I10 Essential (primary) hypertension: Secondary | ICD-10-CM | POA: Diagnosis not present

## 2017-09-24 DIAGNOSIS — E785 Hyperlipidemia, unspecified: Secondary | ICD-10-CM | POA: Diagnosis not present

## 2017-09-26 ENCOUNTER — Ambulatory Visit: Payer: Medicare HMO | Admitting: Family Medicine

## 2017-09-27 ENCOUNTER — Other Ambulatory Visit: Payer: Self-pay | Admitting: *Deleted

## 2017-09-27 NOTE — Telephone Encounter (Signed)
Received call from patient.   Requested refill on Oxycodone/ APAP.   Ok to refill??  Last office visit 09/10/2017.  Last refill 08/31/2017.

## 2017-09-28 ENCOUNTER — Ambulatory Visit: Payer: Medicare HMO | Admitting: Cardiovascular Disease

## 2017-09-28 MED ORDER — OXYCODONE-ACETAMINOPHEN 7.5-325 MG PO TABS: 1.0000 | ORAL_TABLET | Freq: Four times a day (QID) | ORAL | 0 refills | Status: DC | PRN

## 2017-09-29 DIAGNOSIS — R69 Illness, unspecified: Secondary | ICD-10-CM | POA: Diagnosis not present

## 2017-10-04 ENCOUNTER — Encounter: Payer: Self-pay | Admitting: *Deleted

## 2017-10-05 ENCOUNTER — Ambulatory Visit: Payer: Medicare HMO | Admitting: Cardiovascular Disease

## 2017-10-10 ENCOUNTER — Other Ambulatory Visit: Payer: Self-pay | Admitting: *Deleted

## 2017-10-10 ENCOUNTER — Ambulatory Visit: Payer: Medicare HMO | Admitting: Cardiovascular Disease

## 2017-10-10 MED ORDER — "PEN NEEDLES 3/16"" 31G X 5 MM MISC": 3 refills | Status: DC

## 2017-10-10 MED ORDER — "PEN NEEDLES 3/16"" 31G X 5 MM MISC": 1.0000 | Freq: Every day | 3 refills | Status: DC

## 2017-10-22 ENCOUNTER — Other Ambulatory Visit: Payer: Self-pay | Admitting: *Deleted

## 2017-10-22 MED ORDER — OXYCODONE-ACETAMINOPHEN 7.5-325 MG PO TABS: 1.0000 | ORAL_TABLET | Freq: Four times a day (QID) | ORAL | 0 refills | Status: DC | PRN

## 2017-10-22 NOTE — Telephone Encounter (Signed)
Received call from patient.   Requested refill on Oxycodone/APAP.   Ok to refill??  Last office visit 09/10/2017.  Last refill 09/28/2017.

## 2017-10-30 ENCOUNTER — Ambulatory Visit: Payer: Medicare HMO | Admitting: Cardiovascular Disease

## 2017-11-07 ENCOUNTER — Encounter: Payer: Self-pay | Admitting: *Deleted

## 2017-11-07 ENCOUNTER — Other Ambulatory Visit: Payer: Self-pay

## 2017-11-07 ENCOUNTER — Ambulatory Visit (INDEPENDENT_AMBULATORY_CARE_PROVIDER_SITE_OTHER): Payer: Medicare HMO | Admitting: *Deleted

## 2017-11-07 DIAGNOSIS — Z23 Encounter for immunization: Secondary | ICD-10-CM

## 2017-11-20 ENCOUNTER — Other Ambulatory Visit: Payer: Self-pay | Admitting: *Deleted

## 2017-11-20 MED ORDER — OXYCODONE-ACETAMINOPHEN 7.5-325 MG PO TABS: 1.0000 | ORAL_TABLET | Freq: Four times a day (QID) | ORAL | 0 refills | Status: DC | PRN

## 2017-11-20 NOTE — Telephone Encounter (Signed)
Received call from patient.   Requested refill on Oxycodone/ APAP.   Ok to refill??  Last office visit 09/10/2017.  Last refill 10/22/2017.

## 2017-12-02 ENCOUNTER — Other Ambulatory Visit: Payer: Self-pay | Admitting: Family Medicine

## 2017-12-06 ENCOUNTER — Other Ambulatory Visit: Payer: Self-pay | Admitting: Family Medicine

## 2017-12-19 ENCOUNTER — Other Ambulatory Visit: Payer: Self-pay | Admitting: *Deleted

## 2017-12-19 MED ORDER — OXYCODONE-ACETAMINOPHEN 7.5-325 MG PO TABS: 1.0000 | ORAL_TABLET | Freq: Four times a day (QID) | ORAL | 0 refills | Status: DC | PRN

## 2017-12-19 NOTE — Telephone Encounter (Signed)
Received call from patient.   Requested refill on Oxycodone/ APAP.   Ok to refill??  Last office visit 09/10/2017.  Last refill 11/20/2017.

## 2017-12-25 ENCOUNTER — Ambulatory Visit: Payer: Self-pay | Admitting: Family Medicine

## 2017-12-27 ENCOUNTER — Other Ambulatory Visit: Payer: Self-pay | Admitting: Family Medicine

## 2017-12-28 ENCOUNTER — Other Ambulatory Visit: Payer: Self-pay

## 2017-12-28 ENCOUNTER — Ambulatory Visit (INDEPENDENT_AMBULATORY_CARE_PROVIDER_SITE_OTHER): Payer: Medicare HMO | Admitting: Family Medicine

## 2017-12-28 ENCOUNTER — Encounter: Payer: Self-pay | Admitting: Family Medicine

## 2017-12-28 VITALS — BP 132/68 | HR 90 | Temp 98.1°F | Resp 14 | Ht 63.0 in | Wt 213.0 lb

## 2017-12-28 DIAGNOSIS — Z6836 Body mass index (BMI) 36.0-36.9, adult: Secondary | ICD-10-CM | POA: Diagnosis not present

## 2017-12-28 DIAGNOSIS — I10 Essential (primary) hypertension: Secondary | ICD-10-CM

## 2017-12-28 DIAGNOSIS — Z794 Long term (current) use of insulin: Secondary | ICD-10-CM | POA: Diagnosis not present

## 2017-12-28 DIAGNOSIS — E1141 Type 2 diabetes mellitus with diabetic mononeuropathy: Secondary | ICD-10-CM | POA: Diagnosis not present

## 2017-12-28 DIAGNOSIS — M5136 Other intervertebral disc degeneration, lumbar region: Secondary | ICD-10-CM | POA: Diagnosis not present

## 2017-12-28 DIAGNOSIS — E1140 Type 2 diabetes mellitus with diabetic neuropathy, unspecified: Secondary | ICD-10-CM

## 2017-12-28 DIAGNOSIS — M25562 Pain in left knee: Secondary | ICD-10-CM

## 2017-12-28 DIAGNOSIS — R0989 Other specified symptoms and signs involving the circulatory and respiratory systems: Secondary | ICD-10-CM

## 2017-12-28 DIAGNOSIS — G8929 Other chronic pain: Secondary | ICD-10-CM | POA: Diagnosis not present

## 2017-12-28 DIAGNOSIS — M25561 Pain in right knee: Secondary | ICD-10-CM | POA: Diagnosis not present

## 2017-12-28 MED ORDER — NICOTINE 21 MG/24HR TD PT24: 21.0000 mg | MEDICATED_PATCH | Freq: Every day | TRANSDERMAL | 0 refills | Status: DC

## 2017-12-28 MED ORDER — INSULIN DEGLUDEC 100 UNIT/ML ~~LOC~~ SOPN: 12.0000 [IU] | PEN_INJECTOR | Freq: Every day | SUBCUTANEOUS | 2 refills | Status: DC

## 2017-12-28 NOTE — Patient Instructions (Addendum)
Tresiba 12 units  Carotid US to be done in afternoon F/U 3 months- [place] or [place]

## 2017-12-28 NOTE — Progress Notes (Signed)
Subjective:    Patient ID: Stephanie Hammond, female    DOB: [birth year], 67 y.o.   MRN: [id number]  Patient presents for Follow-up (is not fasting) and Knee Pain Pt here to f/u chronic medical problems Meds reviewed  DM- last A1C  6%, taking metformin  500mg  and Tresiba 10units    CBG havve been all over the place range on meter 140-400's.   Sat blood sugar was 520, she passed out at church the next day, thinks blood sugar was down  HTN-taking bp mesd as prescribed, no concerns or side effects   Chronic back pain/knee pain- taking norco only when she needs, states has not taken in 1 week, other days may take in the middle of hte night History of knee surgery bilat, ocntinues to have left knee but does not to go back to orthopedics    Her last visit had tachycardia, she was started on metoprol 12.5mg  daily and referral to cardiology but she cancelled every appointment made per notations from cardiology office   Continues to smoke- she did not get the patches from Feb  Review Of Systems:  GEN- denies fatigue, fever, weight loss,weakness, recent illness HEENT- denies eye drainage, change in vision, nasal discharge, CVS- denies chest pain, palpitations RESP- denies SOB, cough, wheeze ABD- denies N/V, change in stools, abd pain GU- denies dysuria, hematuria, dribbling, incontinence MSK- denies joint pain, muscle aches, injury Neuro- denies headache, dizziness, syncope, seizure activity       Objective:    BP 132/68   Pulse 90   Temp 98.1 F (36.7 C) (Oral)   Resp 14   Ht 5\' 3"  (1.6 m)   Wt 213 lb (96.6 kg)   SpO2 98%   BMI 37.73 kg/m  GEN- NAD, alert and oriented x3 HEENT- PERRL, EOMI, non injected sclera, pink conjunctiva, MMM, oropharynx clear Neck- Supple, no thyromegaly, Right carotid bruit  CVS- RRR, no murmur RESP-CTAB Neuro- CNII-XII in tact no deficits  EXT- No edema Pulses- Radial, DP- 2+        Assessment & Plan:      Problem List Items Addressed  This Visit      Unprioritized   Obesity   Relevant Medications   insulin degludec (TRESIBA FLEXTOUCH) 100 UNIT/ML SOPN FlexTouch Pen   Other Relevant Orders   Drugs of abuse screen w/o alc, rtn urine-sln (Completed)   Diabetic neuropathy (HCC)   Relevant Medications   insulin degludec (TRESIBA FLEXTOUCH) 100 UNIT/ML SOPN FlexTouch Pen   DDD (degenerative disc disease), lumbar   Type 2 diabetes mellitus with diabetic neuropathy (HCC) - Primary    History of fluctating sugars that do not add up to A1C Refuses going back to endocrine Recheck A1C, increase tresiba to 12 units       Relevant Medications   insulin degludec (TRESIBA FLEXTOUCH) 100 UNIT/ML SOPN FlexTouch Pen   Other Relevant Orders   Hemoglobin A1c (Completed)   Lipid panel (Completed)   Essential hypertension, benign    controlled      Relevant Orders   Comprehensive metabolic panel (Completed)   CBC with Differential/Platelet (Completed)   Lipid panel (Completed)   Chronic knee pain    Discussed not calling in for pain meds if not needed every month Urine drug screen obtained She denies any diversiion of medication      Relevant Orders   Drugs of abuse screen w/o alc, rtn urine-sln (Completed)    Other Visit Diagnoses    Bruit of right  carotid artery       obtain carotid US, may have contributed to event on Sunday   Relevant Orders   US Carotid Duplex Bilateral      Note: This dictation was prepared with Dragon dictation along with smaller phrase technology. Any transcriptional errors that result from this process are unintentional.

## 2017-12-29 LAB — CBC WITH DIFFERENTIAL/PLATELET
BASOS ABS: 79 {cells}/uL (ref 0–200)
Basophils Relative: 1.1 %
EOS ABS: 230 {cells}/uL (ref 15–500)
Eosinophils Relative: 3.2 %
HEMATOCRIT: 35.9 % (ref 35.0–45.0)
HEMOGLOBIN: 12.1 g/dL (ref 11.7–15.5)
LYMPHS ABS: 2275 {cells}/uL (ref 850–3900)
MCH: 30.9 pg (ref 27.0–33.0)
MCHC: 33.7 g/dL (ref 32.0–36.0)
MCV: 91.8 fL (ref 80.0–100.0)
MPV: 10.6 fL (ref 7.5–12.5)
Monocytes Relative: 7.5 %
NEUTROS ABS: 4075 {cells}/uL (ref 1500–7800)
Neutrophils Relative %: 56.6 %
Platelets: 340 10*3/uL (ref 140–400)
RBC: 3.91 10*6/uL (ref 3.80–5.10)
RDW: 13.7 % (ref 11.0–15.0)
Total Lymphocyte: 31.6 %
WBC mixed population: 540 cells/uL (ref 200–950)
WBC: 7.2 10*3/uL (ref 3.8–10.8)

## 2017-12-29 LAB — DRUGS OF ABUSE SCREEN W/O ALC, ROUTINE URINE
AMPHETAMINES (1000 ng/mL SCRN): NEGATIVE
BARBITURATES: NEGATIVE
BENZODIAZEPINES: NEGATIVE
COCAINE METABOLITES: NEGATIVE
MARIJUANA MET (50 ng/mL SCRN): NEGATIVE
METHADONE: NEGATIVE
METHAQUALONE: NEGATIVE
OPIATES: NEGATIVE
PHENCYCLIDINE: NEGATIVE
PROPOXYPHENE: NEGATIVE

## 2017-12-29 LAB — COMPREHENSIVE METABOLIC PANEL
AG RATIO: 1.5 (calc) (ref 1.0–2.5)
ALT: 13 U/L (ref 6–29)
AST: 13 U/L (ref 10–35)
Albumin: 4.1 g/dL (ref 3.6–5.1)
Alkaline phosphatase (APISO): 132 U/L — ABNORMAL HIGH (ref 33–130)
BILIRUBIN TOTAL: 0.3 mg/dL (ref 0.2–1.2)
BUN / CREAT RATIO: 21 (calc) (ref 6–22)
BUN: 22 mg/dL (ref 7–25)
CO2: 24 mmol/L (ref 20–32)
Calcium: 9.5 mg/dL (ref 8.6–10.4)
Chloride: 109 mmol/L (ref 98–110)
Creat: 1.06 mg/dL — ABNORMAL HIGH (ref 0.50–0.99)
Globulin: 2.7 g/dL (calc) (ref 1.9–3.7)
Glucose, Bld: 93 mg/dL (ref 65–99)
POTASSIUM: 4.8 mmol/L (ref 3.5–5.3)
SODIUM: 139 mmol/L (ref 135–146)
Total Protein: 6.8 g/dL (ref 6.1–8.1)

## 2017-12-29 LAB — HEMOGLOBIN A1C
EAG (MMOL/L): 6.6 (calc)
HEMOGLOBIN A1C: 5.8 %{Hb} — AB (ref <5.7)
MEAN PLASMA GLUCOSE: 120 (calc)

## 2017-12-29 LAB — LIPID PANEL
Cholesterol: 108 mg/dL (ref <200)
HDL: 37 mg/dL — AB (ref >50)
LDL Cholesterol (Calc): 50 mg/dL (calc)
Non-HDL Cholesterol (Calc): 71 mg/dL (calc) (ref <130)
Total CHOL/HDL Ratio: 2.9 (calc) (ref <5.0)
Triglycerides: 120 mg/dL (ref <150)

## 2017-12-31 ENCOUNTER — Encounter: Payer: Self-pay | Admitting: Family Medicine

## 2017-12-31 NOTE — Assessment & Plan Note (Signed)
Discussed not calling in for pain meds if not needed every month Urine drug screen obtained She denies any diversiion of medication

## 2017-12-31 NOTE — Assessment & Plan Note (Signed)
controlled 

## 2017-12-31 NOTE — Assessment & Plan Note (Signed)
History of fluctating sugars that do not add up to A1C Refuses going back to endocrine Recheck A1C, increase tresiba to 12 units

## 2018-01-01 ENCOUNTER — Other Ambulatory Visit: Payer: Self-pay | Admitting: *Deleted

## 2018-01-01 MED ORDER — INSULIN DEGLUDEC 100 UNIT/ML ~~LOC~~ SOPN: 10.0000 [IU] | PEN_INJECTOR | Freq: Every day | SUBCUTANEOUS | 2 refills | Status: DC

## 2018-01-02 ENCOUNTER — Other Ambulatory Visit: Payer: Self-pay | Admitting: Family Medicine

## 2018-01-08 ENCOUNTER — Ambulatory Visit (HOSPITAL_COMMUNITY)
Admission: RE | Admit: 2018-01-08 | Discharge: 2018-01-08 | Disposition: A | Discharge status: Home / Self Care | Payer: Medicare HMO | Source: Ambulatory Visit | Attending: Family Medicine | Admitting: Family Medicine

## 2018-01-08 ENCOUNTER — Encounter: Payer: Self-pay | Admitting: Gastroenterology

## 2018-01-08 DIAGNOSIS — R0989 Other specified symptoms and signs involving the circulatory and respiratory systems: Secondary | ICD-10-CM | POA: Diagnosis not present

## 2018-01-08 DIAGNOSIS — I6523 Occlusion and stenosis of bilateral carotid arteries: Secondary | ICD-10-CM | POA: Diagnosis not present

## 2018-01-10 ENCOUNTER — Telehealth: Payer: Self-pay | Admitting: *Deleted

## 2018-01-10 ENCOUNTER — Other Ambulatory Visit: Payer: Self-pay | Admitting: *Deleted

## 2018-01-10 MED ORDER — ATORVASTATIN CALCIUM 20 MG PO TABS: 20.0000 mg | ORAL_TABLET | Freq: Every day | ORAL | 3 refills | Status: DC

## 2018-01-10 NOTE — Telephone Encounter (Signed)
Received call from patient.   Discussed results of carotid duplex. Inquired as to if mild cholesterol build up would account for HA. Inquired as to next step for evaluation.   MD please advise.

## 2018-01-10 NOTE — Telephone Encounter (Signed)
No this should not cause headache She did not complain of havivng a lot of headaches She had had the syncopal event at church which was most likely a drop in her blood sugar Keep a headache diary, she can take tylenol see if relived If headaches become frequent or worsen, schedule appt ASAP

## 2018-01-11 DIAGNOSIS — R69 Illness, unspecified: Secondary | ICD-10-CM | POA: Diagnosis not present

## 2018-01-11 NOTE — Telephone Encounter (Signed)
Call placed to patient. LMTRC.  

## 2018-01-11 NOTE — Telephone Encounter (Signed)
Patient returned call and made aware.   States that she has near daily HA with extreme pain.   Appointment scheduled.

## 2018-01-15 ENCOUNTER — Other Ambulatory Visit: Payer: Self-pay | Admitting: Family Medicine

## 2018-01-15 ENCOUNTER — Ambulatory Visit: Payer: Self-pay | Admitting: Family Medicine

## 2018-01-15 MED ORDER — OXYCODONE-ACETAMINOPHEN 7.5-325 MG PO TABS: 1.0000 | ORAL_TABLET | Freq: Four times a day (QID) | ORAL | 0 refills | Status: DC | PRN

## 2018-01-15 NOTE — Telephone Encounter (Signed)
Ok to refill??  Last office visit 12/28/2017.  Last refill 12/19/2017.  Of note, 12/28/2017 lab results- decrease Oxycodone/APAP to #80 tabs

## 2018-01-15 NOTE — Telephone Encounter (Signed)
Refill on ox[facility]codone to [facility] °

## 2018-01-17 ENCOUNTER — Other Ambulatory Visit: Payer: Self-pay | Admitting: Family Medicine

## 2018-01-21 ENCOUNTER — Ambulatory Visit: Payer: Medicare HMO | Admitting: Family Medicine

## 2018-01-22 ENCOUNTER — Ambulatory Visit: Payer: Medicare HMO | Admitting: Family Medicine

## 2018-02-05 ENCOUNTER — Other Ambulatory Visit: Payer: Self-pay | Admitting: Family Medicine

## 2018-02-08 ENCOUNTER — Other Ambulatory Visit: Payer: Self-pay | Admitting: Family Medicine

## 2018-02-08 NOTE — Telephone Encounter (Signed)
Patient left vm requesting a refill on her oxycodone called into [facility] in [place], last refill was 01/15/18.  CB# [phone]

## 2018-02-11 NOTE — Telephone Encounter (Signed)
Ok to refill??  Last office visit 12/28/2017.  Last refill 01/15/2018.

## 2018-02-12 MED ORDER — OXYCODONE-ACETAMINOPHEN 7.5-325 MG PO TABS: 1.0000 | ORAL_TABLET | Freq: Four times a day (QID) | ORAL | 0 refills | Status: DC | PRN

## 2018-03-06 ENCOUNTER — Other Ambulatory Visit: Payer: Self-pay | Admitting: *Deleted

## 2018-03-06 NOTE — Telephone Encounter (Signed)
Call placed to patient and patient made aware.  

## 2018-03-06 NOTE — Telephone Encounter (Signed)
Received call from patient.   Requested refill on Oxycodone/APAP.   Ok to refill??  Last office visit 12/28/2017.  Last refill 02/12/2018.

## 2018-03-10 ENCOUNTER — Other Ambulatory Visit: Payer: Self-pay | Admitting: Family Medicine

## 2018-03-11 ENCOUNTER — Other Ambulatory Visit: Payer: Self-pay | Admitting: *Deleted

## 2018-03-11 MED ORDER — OXYCODONE-ACETAMINOPHEN 7.5-325 MG PO TABS: 1.0000 | ORAL_TABLET | Freq: Four times a day (QID) | ORAL | 0 refills | Status: DC | PRN

## 2018-03-11 NOTE — Telephone Encounter (Signed)
Received call from patient.   Requested refill on Oxycodone/APAP.   Ok to refill??  Last office visit 12/28/2017.  Last refill 02/12/2018.

## 2018-03-17 ENCOUNTER — Other Ambulatory Visit: Payer: Self-pay | Admitting: Family Medicine

## 2018-04-03 ENCOUNTER — Ambulatory Visit: Payer: Medicare HMO | Admitting: Family Medicine

## 2018-04-09 ENCOUNTER — Other Ambulatory Visit: Payer: Self-pay | Admitting: *Deleted

## 2018-04-09 MED ORDER — OXYCODONE-ACETAMINOPHEN 7.5-325 MG PO TABS: 1.0000 | ORAL_TABLET | Freq: Four times a day (QID) | ORAL | 0 refills | Status: DC | PRN

## 2018-04-09 NOTE — Telephone Encounter (Signed)
Received call from patient.   Requested refill on Oxycodone/APAP.   Ok to refill??  Last office visit 12/28/2017.  Last refill 03/11/2018.

## 2018-04-11 ENCOUNTER — Ambulatory Visit: Payer: Medicare HMO | Admitting: Physician Assistant

## 2018-04-11 ENCOUNTER — Ambulatory Visit: Payer: Medicare HMO | Admitting: Family Medicine

## 2018-04-13 DIAGNOSIS — R69 Illness, unspecified: Secondary | ICD-10-CM | POA: Diagnosis not present

## 2018-04-18 ENCOUNTER — Encounter: Payer: Self-pay | Admitting: Physician Assistant

## 2018-04-18 ENCOUNTER — Ambulatory Visit (INDEPENDENT_AMBULATORY_CARE_PROVIDER_SITE_OTHER): Payer: Medicare HMO | Admitting: Physician Assistant

## 2018-04-18 VITALS — BP 118/60 | HR 102 | Temp 98.4°F | Resp 18 | Ht 63.0 in | Wt 212.8 lb

## 2018-04-18 DIAGNOSIS — E1140 Type 2 diabetes mellitus with diabetic neuropathy, unspecified: Secondary | ICD-10-CM

## 2018-04-18 DIAGNOSIS — E785 Hyperlipidemia, unspecified: Secondary | ICD-10-CM

## 2018-04-18 DIAGNOSIS — I2510 Atherosclerotic heart disease of native coronary artery without angina pectoris: Secondary | ICD-10-CM | POA: Diagnosis not present

## 2018-04-18 DIAGNOSIS — Z794 Long term (current) use of insulin: Secondary | ICD-10-CM | POA: Diagnosis not present

## 2018-04-18 DIAGNOSIS — I10 Essential (primary) hypertension: Secondary | ICD-10-CM

## 2018-04-18 DIAGNOSIS — Z720 Tobacco use: Secondary | ICD-10-CM | POA: Diagnosis not present

## 2018-04-18 NOTE — Progress Notes (Signed)
Patient ID: Stephanie Hammond MRN: [id number], DOB: 03/08/1951, 67 y.o. Date of Encounter: @DATE @  Chief Complaint:  Chief Complaint  Patient presents with  . 3 month follow up  . Hyperlipidemia  . Diabetes  . Hypertension    HPI: 67 y.o. year old female  presents for routine f/u OV.   She usually sees Dr. Jeanice Hammond as her PCP.  However Dr. Jeanice Hammond is currently out on maternity leave so Stephanie Hammond is scheduled with me for her routine visit today. Here for routine follow-up of her chronic medical problems including diabetes, hypertension, hyperlipidemia.  Today during visit she does get out her blood sugar meter and shows me blood sugar readings that she has been getting.  So discusses the time of day that they were taken and whether she had eaten or not. She states that she is taking the metformin in the morning and is giving insulin 12 units nightly. She shows me the following blood sugar readings: 302-----was at 11:53 AM.  Says that that would have been before lunch. 318-----was at 1230 in the middle of the night. 256----at 6 PM before eating dinner 208--- this was fasting morning 207--- this was fasting morning The other readings that she flips through but I did not put down the time of day were 225, 318, 323.  Had noted that I had reviewed her last labs from 12/28/2017 and her A1c was good at that time.  During visit today she even makes mention that for some reason her A1c's are good but these sugars are always high.  Discussed her diet.  Says that in the morning around 6 or 7 AM she will eat 1 or 2 boiled eggs. She then talks about the fact that she works as a Psychologist, educational duty work.  If they go to a restaurant to get food that she will get fried chicken with beats or something like that and drinks sweet tea. That if they stay home she will just snack on some saltine crackers. Is then that evening at home she may eat smoke [place] with tomatoes on Rita bread. Says  otherwise for dinner she will cook about twice a week for her husband.  Says last night they ate beef liver and rice.  Eats that dinner around 6 or 7 PM. Says "I know I don't eat right"  She is taking her statin daily.  This is causing no myalgias or other adverse effects. She is taking blood pressure medications as directed.  These are causing no lightheadedness or other adverse effects.  She has no specific concerns to address other than her blood sugars above.     Past Medical History:  Diagnosis Date  . Anemia   . Blood transfusion 2009   after GI bleed  . CAD (coronary artery disease)   . Diabetes mellitus   . Diverticulitis   . Diverticulosis   . GERD (gastroesophageal reflux disease)   . Gout   . History of GI diverticular bleed 08/2004  . Hypertension   . IBS (irritable bowel syndrome)   . Osteoarthritis      Home Meds: Outpatient Medications Prior to Visit  Medication Sig Dispense Refill  . allopurinol (ZYLOPRIM) 300 MG tablet TAKE 1 TABLET BY MOUTH EVERY DAY 90 tablet 1  . amLODipine (NORVASC) 10 MG tablet TAKE 1 TABLET BY MOUTH EVERY DAY 90 tablet 1  . atorvastatin (LIPITOR) 20 MG tablet Take 1 tablet (20 mg total) by mouth daily. 90 tablet  3  . glucose blood (ONE TOUCH ULTRA TEST) test strip USE AS DIRECTED TO TEST THREE TIMES DAILY 300 each 1  . insulin degludec (TRESIBA FLEXTOUCH) 100 UNIT/ML SOPN FlexTouch Pen Inject 0.1 mLs (10 Units total) into the skin daily at 10 pm. 3 pen 2  . Insulin Pen Needle (PEN NEEDLES 3/16") 31G X 5 MM MISC Inject 1 each into the skin daily. Use as directed to inject insulin daily. 100 each 3  . Lancets MISC Use as directed. 100 each 11  . losartan (COZAAR) 100 MG tablet TAKE 1 TABLET BY MOUTH EVERY DAY 90 tablet 1  . meloxicam (MOBIC) 7.5 MG tablet TAKE 1 TABLET BY MOUTH EVERY DAY 30 tablet 5  . metFORMIN (GLUCOPHAGE-XR) 500 MG 24 hr tablet TAKE 2 TABLETS(1000 MG) BY MOUTH DAILY WITH BREAKFAST 180 tablet 2  . metoprolol succinate  (TOPROL-XL) 25 MG 24 hr tablet TAKE 0.5 TABLETS (12.5 MG TOTAL) BY MOUTH DAILY. 45 tablet 2  . nicotine (NICODERM CQ) 21 mg/24hr patch Place 1 patch (21 mg total) onto the skin daily. 28 patch 0  . omeprazole (PRILOSEC) 20 MG capsule TAKE 1 CAPSULE BY MOUTH EVERY DAY IN THE MORNING 90 capsule 0  . oxyCODONE-acetaminophen (PERCOCET) 7.5-325 MG tablet Take 1 tablet by mouth every 6 (six) hours as needed for severe pain. 80 tablet 0  . simvastatin (ZOCOR) 10 MG tablet TAKE 1 TABLET BY MOUTH EVERY DAY IN THE MORNING 90 tablet 1   No facility-administered medications prior to visit.     Allergies:  Allergies  Allergen Reactions  . Ace Inhibitors Cough  . Bentyl [Dicyclomine Hcl] Other (See Comments)    Blurry vision  . Penicillins Itching and Swelling    Social History   Socioeconomic History  . Marital status: Married    Spouse name: Not on file  . Number of children: Not on file  . Years of education: Not on file  . Highest education level: Not on file  Occupational History  . Not on file  Social Needs  . Financial resource strain: Not on file  . Food insecurity:    Worry: Not on file    Inability: Not on file  . Transportation needs:    Medical: Not on file    Non-medical: Not on file  Tobacco Use  . Smoking status: Current Every Day Smoker    Packs/day: 1.00    Years: 35.00    Pack years: 35.00    Types: Cigarettes  . Smokeless tobacco: Never Used  Substance and Sexual Activity  . Alcohol use: No  . Drug use: No  . Sexual activity: Never  Lifestyle  . Physical activity:    Days per week: Not on file    Minutes per session: Not on file  . Stress: Not on file  Relationships  . Social connections:    Talks on phone: Not on file    Gets together: Not on file    Attends religious service: Not on file    Active member of club or organization: Not on file    Attends meetings of clubs or organizations: Not on file    Relationship status: Not on file  . Intimate  partner violence:    Fear of current or ex partner: Not on file    Emotionally abused: Not on file    Physically abused: Not on file    Forced sexual activity: Not on file  Other Topics Concern  . Not on file  Social History Narrative  . Not on file    Family History  Problem Relation Age of Onset  . Colon polyps Sister   . Cancer Mother        lung   . Cancer Father        stomach   . Stomach cancer Father        questionable  . Colon cancer Neg Hx      Review of Systems:  See HPI for pertinent ROS. All other ROS negative.    Physical Exam: Blood pressure 118/60, pulse (!) 102, temperature 98.4 F (36.9 C), temperature source Oral, resp. rate 18, height 5\' 3"  (1.6 m), weight 96.5 kg, SpO2 94 %., Body mass index is 37.7 kg/m. General: Obese AAF. Appears in no acute distress. Neck: Supple. No thyromegaly. No lymphadenopathy. Lungs: Clear bilaterally to auscultation without wheezes, rales, or rhonchi. Breathing is unlabored. Heart: RRR with S1 S2. No murmurs, rubs, or gallops. Abdomen: Soft, non-tender, non-distended with normoactive bowel sounds. No hepatomegaly. No rebound/guarding. No obvious abdominal masses. Musculoskeletal:  Strength and tone normal for age. Extremities/Skin: Warm and dry.  Diabetic Foot Exam: Inspection is normal.  There are no wounds or other problem areas. Neuro: Alert and oriented X 3. Moves all extremities spontaneously. Gait is normal. CNII-XII grossly in tact. Psych:  Responds to questions appropriately with a normal affect.     ASSESSMENT AND PLAN:  67 y.o. year old female with   1. Coronary artery disease involving native coronary artery of native heart without angina pectoris Stable. Controlled.  Cont current treatment.  2. Essential hypertension, benign Blood Pressure is at goal/controlled.  Labs were good 12/28/2017.  Continue current medications.  3. Type 2 diabetes mellitus with diabetic neuropathy, with long-term current use of  insulin [facility]) Reviewed her last note by Dr. Jeanice Hammond and A1c from 12/28/2017.  That note mentioned that blood sugar readings at home are all over the place and today she is showing me all high blood sugar readings.  However her A1c was good 12/28/2017.  Will recheck A1c now. Currently she is taking metformin in the morning and is using long-acting insulin 12 units nightly. - Hemoglobin A1c  4. Tobacco user Needs cessation but has not seemed interested and motivated to quit.  Will defer to Dr. Jeanice Hammond when she returns as she has more relationship with patient.  5. Hyperlipidemia, unspecified hyperlipidemia type She is on statin.  LDL was at goal at lab 12/2017.  LFTs were normal.  Continue current statin.  Plan routine visit in 3 months.  Follow-up sooner if needed.   [address] [place], [place], [facility] 04/18/2018 4:05 PM

## 2018-04-19 LAB — HEMOGLOBIN A1C
Hgb A1c MFr Bld: 6.3 % of total Hgb — ABNORMAL HIGH (ref <5.7)
Mean Plasma Glucose: 134 (calc)
eAG (mmol/L): 7.4 (calc)

## 2018-05-06 ENCOUNTER — Other Ambulatory Visit: Payer: Self-pay | Admitting: Family Medicine

## 2018-05-06 NOTE — Telephone Encounter (Signed)
Refill on oxycodone to [facility]

## 2018-05-07 MED ORDER — OXYCODONE-ACETAMINOPHEN 7.5-325 MG PO TABS: 1.0000 | ORAL_TABLET | Freq: Four times a day (QID) | ORAL | 0 refills | Status: DC | PRN

## 2018-05-07 NOTE — Telephone Encounter (Signed)
Ok to refill??  Last office visit 04/18/2018.  Last refill 04/09/2018.

## 2018-06-05 ENCOUNTER — Other Ambulatory Visit: Payer: Self-pay | Admitting: Family Medicine

## 2018-06-05 NOTE — Telephone Encounter (Signed)
Refill on oxycodone to [facility]

## 2018-06-06 MED ORDER — OXYCODONE-ACETAMINOPHEN 7.5-325 MG PO TABS: 1.0000 | ORAL_TABLET | Freq: Four times a day (QID) | ORAL | 0 refills | Status: DC | PRN

## 2018-06-06 NOTE — Telephone Encounter (Signed)
Ok to refill??  Last office visit 04/18/2018.  Last refill 05/07/2018.

## 2018-06-15 ENCOUNTER — Other Ambulatory Visit: Payer: Self-pay | Admitting: Family Medicine

## 2018-06-18 ENCOUNTER — Other Ambulatory Visit: Payer: Self-pay | Admitting: Family Medicine

## 2018-06-28 ENCOUNTER — Other Ambulatory Visit: Payer: Self-pay | Admitting: Family Medicine

## 2018-06-28 MED ORDER — OXYCODONE-ACETAMINOPHEN 7.5-325 MG PO TABS: 1.0000 | ORAL_TABLET | Freq: Four times a day (QID) | ORAL | 0 refills | Status: DC | PRN

## 2018-06-28 NOTE — Telephone Encounter (Signed)
[facility]  Patient needs refill on her oxycodone

## 2018-06-28 NOTE — Telephone Encounter (Signed)
Ok to refill??  Last office visit 04/18/2018.  Last refill 06/06/2018.

## 2018-07-03 ENCOUNTER — Other Ambulatory Visit: Payer: Self-pay | Admitting: Family Medicine

## 2018-07-03 MED ORDER — INSULIN DEGLUDEC 100 UNIT/ML ~~LOC~~ SOPN: 12.0000 [IU] | PEN_INJECTOR | Freq: Every day | SUBCUTANEOUS | 2 refills | Status: DC

## 2018-07-05 ENCOUNTER — Other Ambulatory Visit: Payer: Self-pay | Admitting: Family Medicine

## 2018-07-08 DIAGNOSIS — M25562 Pain in left knee: Secondary | ICD-10-CM | POA: Diagnosis not present

## 2018-07-08 DIAGNOSIS — T84031A Mechanical loosening of internal left hip prosthetic joint, initial encounter: Secondary | ICD-10-CM | POA: Diagnosis not present

## 2018-07-10 ENCOUNTER — Other Ambulatory Visit (HOSPITAL_COMMUNITY): Payer: Self-pay | Admitting: Orthopedic Surgery

## 2018-07-10 DIAGNOSIS — T84031A Mechanical loosening of internal left hip prosthetic joint, initial encounter: Secondary | ICD-10-CM

## 2018-07-12 ENCOUNTER — Other Ambulatory Visit: Payer: Self-pay | Admitting: Family Medicine

## 2018-07-12 DIAGNOSIS — R69 Illness, unspecified: Secondary | ICD-10-CM | POA: Diagnosis not present

## 2018-07-16 ENCOUNTER — Other Ambulatory Visit: Payer: Self-pay | Admitting: *Deleted

## 2018-07-16 MED ORDER — INSULIN DEGLUDEC 100 UNIT/ML ~~LOC~~ SOPN: 12.0000 [IU] | PEN_INJECTOR | Freq: Every day | SUBCUTANEOUS | 2 refills | Status: DC

## 2018-07-18 ENCOUNTER — Other Ambulatory Visit: Payer: Self-pay | Admitting: *Deleted

## 2018-07-18 MED ORDER — INSULIN DEGLUDEC 100 UNIT/ML ~~LOC~~ SOPN: 12.0000 [IU] | PEN_INJECTOR | Freq: Every day | SUBCUTANEOUS | 2 refills | Status: DC

## 2018-07-24 ENCOUNTER — Other Ambulatory Visit (HOSPITAL_COMMUNITY): Payer: Medicare HMO

## 2018-07-24 ENCOUNTER — Encounter (HOSPITAL_COMMUNITY): Payer: Medicare HMO

## 2018-07-26 ENCOUNTER — Other Ambulatory Visit: Payer: Self-pay | Admitting: *Deleted

## 2018-07-26 MED ORDER — OXYCODONE-ACETAMINOPHEN 7.5-325 MG PO TABS: 1.0000 | ORAL_TABLET | Freq: Four times a day (QID) | ORAL | 0 refills | Status: DC | PRN

## 2018-07-26 NOTE — Telephone Encounter (Signed)
Received call from patient.   Requested refill on Oxycodone/APAP.   Ok to refill??  Last office visit 04/18/2018.  Last refill 06/28/2018.

## 2018-07-27 ENCOUNTER — Other Ambulatory Visit: Payer: Self-pay | Admitting: Family Medicine

## 2018-08-16 ENCOUNTER — Encounter (HOSPITAL_COMMUNITY)
Admission: RE | Admit: 2018-08-16 | Discharge: 2018-08-16 | Disposition: A | Discharge status: Home / Self Care | Payer: Medicare HMO | Source: Ambulatory Visit | Attending: Orthopedic Surgery | Admitting: Orthopedic Surgery

## 2018-08-16 DIAGNOSIS — Z96653 Presence of artificial knee joint, bilateral: Secondary | ICD-10-CM | POA: Diagnosis not present

## 2018-08-16 DIAGNOSIS — T84031A Mechanical loosening of internal left hip prosthetic joint, initial encounter: Secondary | ICD-10-CM

## 2018-08-16 DIAGNOSIS — Z471 Aftercare following joint replacement surgery: Secondary | ICD-10-CM | POA: Diagnosis not present

## 2018-08-16 MED ORDER — TECHNETIUM TC 99M MEDRONATE IV KIT
20.7000 | PACK | Freq: Once | INTRAVENOUS | Status: AC | PRN
  Administered 2018-08-16: 20.7 via INTRAVENOUS

## 2018-08-23 ENCOUNTER — Other Ambulatory Visit: Payer: Self-pay | Admitting: Family Medicine

## 2018-08-23 NOTE — Telephone Encounter (Signed)
Ok to refill??  Last office visit 04/18/2018.  Last refill 07/26/2018.

## 2018-08-23 NOTE — Telephone Encounter (Signed)
Pt needs refill on oxycodone to [facility]

## 2018-08-25 MED ORDER — OXYCODONE-ACETAMINOPHEN 7.5-325 MG PO TABS: 1.0000 | ORAL_TABLET | Freq: Four times a day (QID) | ORAL | 0 refills | Status: DC | PRN

## 2018-09-03 DIAGNOSIS — M25562 Pain in left knee: Secondary | ICD-10-CM | POA: Diagnosis not present

## 2018-09-09 ENCOUNTER — Ambulatory Visit: Payer: Medicare HMO | Admitting: Family Medicine

## 2018-09-13 ENCOUNTER — Ambulatory Visit (INDEPENDENT_AMBULATORY_CARE_PROVIDER_SITE_OTHER): Payer: Medicare HMO | Admitting: Family Medicine

## 2018-09-13 ENCOUNTER — Other Ambulatory Visit: Payer: Self-pay

## 2018-09-13 ENCOUNTER — Encounter: Payer: Self-pay | Admitting: Family Medicine

## 2018-09-13 VITALS — BP 134/68 | HR 112 | Temp 100.9°F | Resp 14 | Ht 63.0 in | Wt 211.0 lb

## 2018-09-13 DIAGNOSIS — J209 Acute bronchitis, unspecified: Secondary | ICD-10-CM

## 2018-09-13 DIAGNOSIS — I2510 Atherosclerotic heart disease of native coronary artery without angina pectoris: Secondary | ICD-10-CM

## 2018-09-13 DIAGNOSIS — E1140 Type 2 diabetes mellitus with diabetic neuropathy, unspecified: Secondary | ICD-10-CM | POA: Diagnosis not present

## 2018-09-13 DIAGNOSIS — E785 Hyperlipidemia, unspecified: Secondary | ICD-10-CM | POA: Diagnosis not present

## 2018-09-13 DIAGNOSIS — Z720 Tobacco use: Secondary | ICD-10-CM

## 2018-09-13 DIAGNOSIS — Z6836 Body mass index (BMI) 36.0-36.9, adult: Secondary | ICD-10-CM

## 2018-09-13 DIAGNOSIS — M1712 Unilateral primary osteoarthritis, left knee: Secondary | ICD-10-CM

## 2018-09-13 DIAGNOSIS — Z01818 Encounter for other preprocedural examination: Secondary | ICD-10-CM

## 2018-09-13 DIAGNOSIS — I10 Essential (primary) hypertension: Secondary | ICD-10-CM

## 2018-09-13 DIAGNOSIS — Z794 Long term (current) use of insulin: Secondary | ICD-10-CM | POA: Diagnosis not present

## 2018-09-13 DIAGNOSIS — R509 Fever, unspecified: Secondary | ICD-10-CM | POA: Diagnosis not present

## 2018-09-13 LAB — INFLUENZA A AND B AG, IMMUNOASSAY
INFLUENZA A ANTIGEN: NOT DETECTED
INFLUENZA B ANTIGEN: NOT DETECTED

## 2018-09-13 MED ORDER — AZITHROMYCIN 250 MG PO TABS: ORAL_TABLET | ORAL | 0 refills | Status: DC

## 2018-09-13 MED ORDER — BENZONATATE 100 MG PO CAPS: 100.0000 mg | ORAL_CAPSULE | Freq: Three times a day (TID) | ORAL | 0 refills | Status: DC | PRN

## 2018-09-13 NOTE — Assessment & Plan Note (Signed)
Asymptomatic, on statin, beta blocker

## 2018-09-13 NOTE — Assessment & Plan Note (Signed)
Will obtain labs, if A1C is at goal or at least less than 8%, she can be cleared for surgery  She is a smoker, placing her at higher risk of complications and with acute illness, so will obtain CXR before surgery

## 2018-09-13 NOTE — Assessment & Plan Note (Signed)
Controlled no changes 

## 2018-09-13 NOTE — Patient Instructions (Addendum)
Take antibiotics Use tessalon perrles or mucinex DM Get the chest xray We will call with lab results Increase tresiba to 14 units if fastings stay above 200 F/U 4 months

## 2018-09-13 NOTE — Progress Notes (Signed)
Subjective:    Patient ID: Stephanie Hammond, female    DOB: Dec 09, 1950, 68 y.o.   MRN: [id number]  Patient presents for Preoperative Clearance (L knee TKA- [facility]) and Illness (x2 weeks- nonproductive cough, chills)  Dry cough for past 2 weeks, no nasal drainage. + Chills, +fever,  Some body aches, no vomiting, + chronic diarrhea  No known sick contact   No flu shot   DM- last A1C 6.3%, CBG at home range 140-200's fasting, evening up to 400's , Tresiba taking 12 units, has taken a little more  Taking  1000mg  XR metformin    HTN- taking losartan  , norvasc , toprol    Hyperlipidemia- taking lipitor at bedtime   Chronic pain- planning left total knee replacement , needs medical clearance  Tobacco use- continues to smoke 1/2PPD           Review Of Systems:  GEN- denies fatigue, +fever, weight loss,weakness, recent illness HEENT- denies eye drainage, change in vision, nasal discharge, CVS- denies chest pain, palpitations RESP- denies SOB, +cough, wheeze ABD- denies N/V, change in stools, abd pain GU- denies dysuria, hematuria, dribbling, incontinence MSK- + joint pain, +muscle aches, injury Neuro- denies headache, dizziness, syncope, seizure activity       Objective:    BP 134/68   Pulse (!) 112   Temp (!) 100.9 F (38.3 C) (Oral)   Resp 14   Ht 5\' 3"  (1.6 m)   Wt 211 lb (95.7 kg)   SpO2 92%   BMI 37.38 kg/m  GEN- NAD, alert and oriented x3 HEENT- PERRL, EOMI, non injected sclera, pink conjunctiva, MMM, oropharynx clear, no maxillary sinus tenderness, TM Clear bilat no effusion Neck- Supple, no thyromegaly, no LAD CVS- RRR, no murmur RESP-CTAB,harsh cough ABD-NABS,soft,NT,ND, no CVA tenderness EXT- No edema Pulses- Radial, DP- 2+  Flu negative       Assessment & Plan:      Problem List Items Addressed This Visit      Unprioritized   Arthritis of knee, degenerative    Will obtain labs, if A1C is at goal or at least less than 8%, she can be  cleared for surgery  She is a smoker, placing her at higher risk of complications and with acute illness, so will obtain CXR before surgery       CAD (coronary artery disease)    Asymptomatic, on statin, beta blocker       Relevant Orders   DG Chest 2 View   EKG 12-Lead (Completed)   CBC with Differential/Platelet   Comprehensive metabolic panel   Essential hypertension, benign    Controlled no changes       Hyperlipidemia   Relevant Orders   Lipid panel   Obesity   Tobacco user   Relevant Orders   DG Chest 2 View   Type 2 diabetes mellitus with diabetic neuropathy (HCC)    Very labile blood sugars that often dont add up to her A1C Will check today Continue tresiba 12 units and metformin       Relevant Orders   Hemoglobin A1c   Lipid panel   Urinalysis, Routine w reflex microscopic   Microalbumin / creatinine urine ratio    Other Visit Diagnoses    Acute bronchitis, unspecified organism    -  Primary   Flu neg, progressive symptoms n smoker, that is diabetic, add zpak, tessalon, high risk of CAP, CXR   Relevant Orders   Influenza A and B Ag, Immunoassay (  Completed)   DG Chest 2 View   Pre-operative clearance          Note: This dictation was prepared with Dragon dictation along with smaller phrase technology. Any transcriptional errors that result from this process are unintentional.

## 2018-09-13 NOTE — Assessment & Plan Note (Signed)
Very labile blood sugars that often dont add up to her A1C Will check today Continue tresiba 12 units and metformin

## 2018-09-14 LAB — COMPREHENSIVE METABOLIC PANEL
AG RATIO: 1.3 (calc) (ref 1.0–2.5)
ALT: 18 U/L (ref 6–29)
AST: 20 U/L (ref 10–35)
Albumin: 4 g/dL (ref 3.6–5.1)
Alkaline phosphatase (APISO): 118 U/L (ref 37–153)
BUN / CREAT RATIO: 18 (calc) (ref 6–22)
BUN: 18 mg/dL (ref 7–25)
CHLORIDE: 105 mmol/L (ref 98–110)
CO2: 25 mmol/L (ref 20–32)
Calcium: 8.9 mg/dL (ref 8.6–10.4)
Creat: 1.02 mg/dL — ABNORMAL HIGH (ref 0.50–0.99)
GLOBULIN: 3 g/dL (ref 1.9–3.7)
GLUCOSE: 162 mg/dL — AB (ref 65–99)
POTASSIUM: 4.1 mmol/L (ref 3.5–5.3)
SODIUM: 140 mmol/L (ref 135–146)
Total Bilirubin: 0.3 mg/dL (ref 0.2–1.2)
Total Protein: 7 g/dL (ref 6.1–8.1)

## 2018-09-14 LAB — URINALYSIS, ROUTINE W REFLEX MICROSCOPIC
BILIRUBIN URINE: NEGATIVE
GLUCOSE, UA: NEGATIVE
HGB URINE DIPSTICK: NEGATIVE
Ketones, ur: NEGATIVE
Leukocytes, UA: NEGATIVE
Nitrite: NEGATIVE
Protein, ur: NEGATIVE
RBC / HPF: NONE SEEN /HPF (ref 0–2)
SPECIFIC GRAVITY, URINE: 1.025 (ref 1.001–1.03)

## 2018-09-14 LAB — CBC WITH DIFFERENTIAL/PLATELET
ABSOLUTE MONOCYTES: 712 {cells}/uL (ref 200–950)
Basophils Absolute: 50 cells/uL (ref 0–200)
Basophils Relative: 0.8 %
EOS ABS: 32 {cells}/uL (ref 15–500)
EOS PCT: 0.5 %
HEMATOCRIT: 34.3 % — AB (ref 35.0–45.0)
HEMOGLOBIN: 11.4 g/dL — AB (ref 11.7–15.5)
LYMPHS ABS: 964 {cells}/uL (ref 850–3900)
MCH: 30.6 pg (ref 27.0–33.0)
MCHC: 33.2 g/dL (ref 32.0–36.0)
MCV: 92.2 fL (ref 80.0–100.0)
MPV: 10.9 fL (ref 7.5–12.5)
Monocytes Relative: 11.3 %
NEUTROS ABS: 4542 {cells}/uL (ref 1500–7800)
Neutrophils Relative %: 72.1 %
Platelets: 284 10*3/uL (ref 140–400)
RBC: 3.72 10*6/uL — ABNORMAL LOW (ref 3.80–5.10)
RDW: 13.7 % (ref 11.0–15.0)
Total Lymphocyte: 15.3 %
WBC: 6.3 10*3/uL (ref 3.8–10.8)

## 2018-09-14 LAB — MICROALBUMIN / CREATININE URINE RATIO
Creatinine, Urine: 170 mg/dL (ref 20–275)
MICROALB/CREAT RATIO: 3 ug/mg{creat} (ref <30)
Microalb, Ur: 0.5 mg/dL

## 2018-09-14 LAB — HEMOGLOBIN A1C
Hgb A1c MFr Bld: 6.2 % of total Hgb — ABNORMAL HIGH (ref <5.7)
Mean Plasma Glucose: 131 (calc)
eAG (mmol/L): 7.3 (calc)

## 2018-09-14 LAB — LIPID PANEL
CHOL/HDL RATIO: 2.8 (calc) (ref <5.0)
Cholesterol: 96 mg/dL (ref <200)
HDL: 34 mg/dL — ABNORMAL LOW (ref >=50)
LDL CHOLESTEROL (CALC): 41 mg/dL
NON-HDL CHOLESTEROL (CALC): 62 mg/dL (ref <130)
TRIGLYCERIDES: 124 mg/dL (ref <150)

## 2018-09-17 ENCOUNTER — Telehealth: Payer: Self-pay | Admitting: *Deleted

## 2018-09-17 NOTE — Telephone Encounter (Signed)
Okay to order, using OA knee, they may not cover the shower chair No to increase in pain medication, this can be changed by her surgeon if needed after the surgery

## 2018-09-17 NOTE — Telephone Encounter (Signed)
Order faxed to [facility].   Call placed to patient and patient made aware per VM.

## 2018-09-17 NOTE — Telephone Encounter (Signed)
Received call from patient.   Reports that son removed her tub and remodeled her bathroom with a stand up shower. States that she has difficulty standing for such a long period of time and requested order for shower chair.   Also states that she had discussed increasing quantity of pain medication with MD to 100 tabs/month. Inquired as to if she can get her monthly prescription refilled at the increased quantity.  Last refill 08/25/2018.  MD please advise.

## 2018-09-19 ENCOUNTER — Ambulatory Visit (HOSPITAL_COMMUNITY)
Admission: RE | Admit: 2018-09-19 | Discharge: 2018-09-19 | Disposition: A | Discharge status: Home / Self Care | Payer: Medicare HMO | Source: Ambulatory Visit | Attending: Family Medicine | Admitting: Family Medicine

## 2018-09-19 DIAGNOSIS — Z720 Tobacco use: Secondary | ICD-10-CM

## 2018-09-19 DIAGNOSIS — J209 Acute bronchitis, unspecified: Secondary | ICD-10-CM | POA: Diagnosis not present

## 2018-09-19 DIAGNOSIS — R05 Cough: Secondary | ICD-10-CM | POA: Diagnosis not present

## 2018-09-19 DIAGNOSIS — I2510 Atherosclerotic heart disease of native coronary artery without angina pectoris: Secondary | ICD-10-CM | POA: Diagnosis not present

## 2018-09-20 ENCOUNTER — Other Ambulatory Visit: Payer: Self-pay | Admitting: Family Medicine

## 2018-09-20 MED ORDER — OXYCODONE-ACETAMINOPHEN 7.5-325 MG PO TABS: 1.0000 | ORAL_TABLET | Freq: Four times a day (QID) | ORAL | 0 refills | Status: DC | PRN

## 2018-09-20 NOTE — Telephone Encounter (Signed)
Oxycodone refill request. Last seen 09/13/2018. Please advise.

## 2018-09-20 NOTE — Telephone Encounter (Signed)
Refill on oxycodone to [facility], also had ?'s about her A1C.

## 2018-09-24 ENCOUNTER — Other Ambulatory Visit: Payer: Self-pay | Admitting: *Deleted

## 2018-09-24 MED ORDER — FLUCONAZOLE 150 MG PO TABS: 150.0000 mg | ORAL_TABLET | Freq: Once | ORAL | 0 refills | Status: AC

## 2018-09-30 ENCOUNTER — Telehealth: Payer: Self-pay | Admitting: *Deleted

## 2018-09-30 NOTE — Telephone Encounter (Signed)
Received call from patient.   Reports that she has vaginal burning and yellow colored discharge.   States that she tried Diflucan for itching and burning with no relief.   Advised patient that she will require OV to eval.

## 2018-10-03 ENCOUNTER — Other Ambulatory Visit: Payer: Self-pay | Admitting: Family Medicine

## 2018-10-07 ENCOUNTER — Telehealth: Payer: Self-pay | Admitting: *Deleted

## 2018-10-07 NOTE — Telephone Encounter (Signed)
Spoke with patient and advised that cultures can be done at PCP office.   Appointment scheduled.

## 2018-10-07 NOTE — Telephone Encounter (Signed)
Pt has OV

## 2018-10-07 NOTE — Telephone Encounter (Signed)
Received call from patient.   States that she continues to have intense vaginal itching and burning. Has tried Diflucan with no relief. Advised that to further assess, OV will be required. States that she would prefer referral to GYN.   MD please advise.

## 2018-10-09 ENCOUNTER — Other Ambulatory Visit: Payer: Self-pay

## 2018-10-09 ENCOUNTER — Ambulatory Visit (INDEPENDENT_AMBULATORY_CARE_PROVIDER_SITE_OTHER): Payer: Medicare HMO | Admitting: Family Medicine

## 2018-10-09 ENCOUNTER — Encounter: Payer: Self-pay | Admitting: Family Medicine

## 2018-10-09 VITALS — BP 124/68 | HR 82 | Temp 98.1°F | Resp 14 | Ht 63.0 in | Wt 208.0 lb

## 2018-10-09 DIAGNOSIS — R319 Hematuria, unspecified: Secondary | ICD-10-CM | POA: Diagnosis not present

## 2018-10-09 DIAGNOSIS — R300 Dysuria: Secondary | ICD-10-CM | POA: Diagnosis not present

## 2018-10-09 DIAGNOSIS — N760 Acute vaginitis: Secondary | ICD-10-CM | POA: Diagnosis not present

## 2018-10-09 DIAGNOSIS — N390 Urinary tract infection, site not specified: Secondary | ICD-10-CM

## 2018-10-09 LAB — URINALYSIS, ROUTINE W REFLEX MICROSCOPIC
BILIRUBIN URINE: NEGATIVE
Glucose, UA: NEGATIVE
KETONES UR: NEGATIVE
NITRITE: NEGATIVE
SPECIFIC GRAVITY, URINE: 1.02 (ref 1.001–1.03)
pH: 5.5 (ref 5.0–8.0)

## 2018-10-09 LAB — WET PREP FOR TRICH, YEAST, CLUE

## 2018-10-09 LAB — MICROSCOPIC MESSAGE

## 2018-10-09 MED ORDER — FLUCONAZOLE 150 MG PO TABS: ORAL_TABLET | ORAL | 1 refills | Status: DC

## 2018-10-09 MED ORDER — CIPROFLOXACIN HCL 500 MG PO TABS: 500.0000 mg | ORAL_TABLET | Freq: Two times a day (BID) | ORAL | 0 refills | Status: DC

## 2018-10-09 MED ORDER — PHENAZOPYRIDINE HCL 100 MG PO TABS: 100.0000 mg | ORAL_TABLET | Freq: Three times a day (TID) | ORAL | 0 refills | Status: DC | PRN

## 2018-10-09 NOTE — Addendum Note (Signed)
Addended by: Sheral Flow on: 10/09/2018 04:19 PM   Modules accepted: Orders

## 2018-10-09 NOTE — Patient Instructions (Signed)
Take the keflex Use diflucan after antibiotics F/U as previous

## 2018-10-09 NOTE — Progress Notes (Signed)
Subjective:    Patient ID: Stephanie Hammond, female    DOB: 24-Nov-1950, 68 y.o.   MRN: [id number]  Patient presents for Vaginitis (brownishe yellow discharge with some discomfort) and Dysuria (x1 week- lower abd pain, nausea, cloudy urine, buring with urination)  Patient here with dysuria and vaginal pain for the past week.  She has had some mild burning with urination no fever but has had nausea.  She is also had some vaginal discharge started using Monistat the symptoms did not improve.  She is scheduled to have her knee surgery in 2 weeks.  She was recently seen for preoperative clearance and for her diabetes.  States her blood sugars have been good.   Review Of Systems:  GEN- denies fatigue, fever, weight loss,weakness, recent illness HEENT- denies eye drainage, change in vision, nasal discharge, CVS- denies chest pain, palpitations RESP- denies SOB, cough, wheeze ABD- denies N/V, change in stools, abd pain GU- + dysuria, hematuria, dribbling, incontinence MSK- denies joint pain, muscle aches, injury Neuro- denies headache, dizziness, syncope, seizure activity       Objective:    BP 124/68   Pulse 82   Temp 98.1 F (36.7 C) (Oral)   Resp 14   Ht 5\' 3"  (1.6 m)   Wt 208 lb (94.3 kg)   SpO2 97%   BMI 36.85 kg/m  GEN- NAD, alert and oriented x3 HEENT- PERRL, EOMI, non injected sclera, pink conjunctiva, MMM, oropharynx clear CVS- RRR, no murmur RESP-CTAB ABD-NABS,soft,NT,ND, no CVA tenderness GU- normal external genitalia, vaginal mucosa atrophy, mild white discharge between labial folds, blind q tip placed in introitus  EXT- No edema Pulses- Radial 2+        Assessment & Plan:      Problem List Items Addressed This Visit    None    Visit Diagnoses    Urinary tract infection with hematuria, site unspecified    -  Primary   start cipro BID x 7 days urine culture sent, hives with PCN. pyridium for blader irritation. Has knee surgery in 2 weeks   Relevant  Medications   fluconazole (DIFLUCAN) 150 MG tablet   phenazopyridine (PYRIDIUM) 100 MG tablet   Other Relevant Orders   Urinalysis, Routine w reflex microscopic   Urine Culture   Acute vaginitis       wet prep , bacteria, but with 2nd round of antibiotics in diabetic, send diflucan      Note: This dictation was prepared with Dragon dictation along with smaller phrase technology. Any transcriptional errors that result from this process are unintentional.

## 2018-10-10 DIAGNOSIS — R69 Illness, unspecified: Secondary | ICD-10-CM | POA: Diagnosis not present

## 2018-10-10 LAB — URINE CULTURE
MICRO NUMBER: 276284
SPECIMEN QUALITY:: ADEQUATE

## 2018-10-15 ENCOUNTER — Telehealth: Payer: Self-pay | Admitting: *Deleted

## 2018-10-15 MED ORDER — OXYCODONE-ACETAMINOPHEN 7.5-325 MG PO TABS: 1.0000 | ORAL_TABLET | Freq: Four times a day (QID) | ORAL | 0 refills | Status: DC | PRN

## 2018-10-15 NOTE — Telephone Encounter (Signed)
Will refill pt can get every 30 days

## 2018-10-15 NOTE — Telephone Encounter (Signed)
Received VM from patient.   Requested office call to fill prescription early? Call placed to patient to inquire. [place].

## 2018-10-15 NOTE — Telephone Encounter (Addendum)
Patient returned call.   Requested refill on Oxycodone/APAP.  Reports that pharmacy told her she is due for refill.   Ok to refill??  Last office visit 10/09/2018.  Last refill 09/20/2018.

## 2018-10-21 ENCOUNTER — Ambulatory Visit: Payer: Self-pay | Admitting: Orthopedic Surgery

## 2018-10-21 NOTE — H&P (Signed)
TOTAL KNEE REVISION ADMISSION H&P  Patient is being admitted for left revision total knee arthroplasty.  Subjective:  Chief Complaint:left knee pain.  HPI: Stephanie Hammond, 67 y.o. female, has a history of pain and functional disability in the left knee(s) due to failed previous arthroplasty and patient has failed non-surgical conservative treatments for greater than 12 weeks to include NSAID's and/or analgesics, flexibility and strengthening excercises, use of assistive devices, weight reduction as appropriate and activity modification. The indications for the revision of the total knee arthroplasty are loosening of one or more components. Onset of symptoms was gradual starting 4 years ago with gradually worsening course since that time.  Prior procedures on the left knee(s) include arthroplasty. Patient currently rates pain in the left knee(s) at 10 out of 10 with activity. There is night pain, worsening of pain with activity and weight bearing, pain that interferes with activities of daily living, pain with passive range of motion, crepitus and joint swelling.  Patient has evidence of prosthetic loosening by imaging studies. This condition presents safety issues increasing the risk of falls.  There is no current active infection.      Patient Active Problem List   Diagnosis Date Noted  . Anemia 12/31/2015  . Arthritis of knee, degenerative 03/12/2015  . Arthritis of left knee   . Gout 09/07/2014  . Back pain with left-sided radiculopathy 09/07/2014  . Type 2 diabetes mellitus with diabetic neuropathy (HCC) 02/14/2014  . Hyperlipidemia 03/12/2013  . IBS (irritable bowel syndrome) 01/09/2013  . Diabetic neuropathy (HCC) 11/14/2012  . Diarrhea 09/13/2012  . Major depression, single episode 09/13/2012  . Essential hypertension, benign 06/09/2012  . DDD (degenerative disc disease), lumbar 03/28/2012  . Chronic knee pain 03/28/2012  . Obesity 03/28/2012  . Tobacco user 03/28/2012   . CAD (coronary artery disease) 03/28/2012       Past Medical History:  Diagnosis Date  . Anemia   . Blood transfusion 2009   after GI bleed  . CAD (coronary artery disease)   . Diabetes mellitus   . Diverticulitis   . Diverticulosis   . GERD (gastroesophageal reflux disease)   . Gout   . History of GI diverticular bleed 08/2004  . Hypertension   . IBS (irritable bowel syndrome)   . Osteoarthritis          Past Surgical History:  Procedure Laterality Date  . CHOLECYSTECTOMY  2003  . COLONOSCOPY  2000   Dr. Stark: marked diverticulosis, difficult procedure due to adhesions, polyps benign  . COLONOSCOPY  2006   Dr. Perry: severe pandiverticulosis  . COLONOSCOPY N/A 02/03/2013   SLF: 4 COLORECTAL POLYPS REMOVED/Moderate diverticulosis throughout the entire examined colon/Small internal hemorrhoids  . JOINT REPLACEMENT Right 2009   Knee Replacement Right   . PTCA    . REPLACEMENT TOTAL KNEE     right   . TOTAL KNEE ARTHROPLASTY Left 03/12/2015   Procedure: LEFT TOTAL KNEE REPLACEMENT;  Surgeon: Stanley E Harrison, MD;  Location: [facility];  Service: Orthopedics;  Laterality: Left;  . VAGINAL HYSTERECTOMY             Current Outpatient Medications  Medication Sig Dispense Refill Last Dose  . allopurinol (ZYLOPRIM) 300 MG tablet TAKE 1 TABLET BY MOUTH EVERY DAY (Patient taking differently: Take 300 mg by mouth daily. ) 90 tablet 1 Taking  . amLODipine (NORVASC) 10 MG tablet TAKE 1 TABLET BY MOUTH EVERY DAY (Patient taking differently: Take 10 mg by mouth daily. ) 90   tablet 1 Taking  . atorvastatin (LIPITOR) 20 MG tablet Take 1 tablet (20 mg total) by mouth daily. (Patient not taking: Reported on 10/21/2018) 90 tablet 3 Not Taking at Unknown time  . ciprofloxacin (CIPRO) 500 MG tablet Take 1 tablet (500 mg total) by mouth 2 (two) times daily. (Patient not taking: Reported on 10/21/2018) 14 tablet 0 Not Taking at Unknown time  . fluconazole (DIFLUCAN) 150 MG  tablet Take 1 tablet repeat in 3 days (Patient not taking: Reported on 10/21/2018) 2 tablet 1 Not Taking at Unknown time  . glucose blood (ONE TOUCH ULTRA TEST) test strip USE AS DIRECTED TO TEST THREE TIMES DAILY 300 each 3 Taking  . insulin degludec (TRESIBA FLEXTOUCH) 100 UNIT/ML SOPN FlexTouch Pen Inject 0.12 mLs (12 Units total) into the skin daily at 10 pm. 2 pen 2 Taking  . Insulin Pen Needle (PEN NEEDLES 3/16") 31G X 5 MM MISC Inject 1 each into the skin daily. Use as directed to inject insulin daily. 100 each 3 Taking  . Lancets MISC Use as directed. 100 each 11 Taking  . losartan (COZAAR) 100 MG tablet TAKE 1 TABLET BY MOUTH EVERY DAY (Patient taking differently: Take 100 mg by mouth daily. ) 90 tablet 1 Taking  . meloxicam (MOBIC) 7.5 MG tablet TAKE 1 TABLET BY MOUTH EVERY DAY (Patient taking differently: Take 7.5 mg by mouth daily. ) 90 tablet 1 Taking  . metFORMIN (GLUCOPHAGE-XR) 500 MG 24 hr tablet TAKE 2 TABLETS(1000 MG) BY MOUTH DAILY WITH BREAKFAST (Patient taking differently: Take 500 mg by mouth daily with breakfast. TAKE 2 TABLETS(1000 MG) BY MOUTH DAILY WITH BREAKFAST) 180 tablet 2 Taking  . metoprolol succinate (TOPROL-XL) 25 MG 24 hr tablet TAKE 0.5 TABLETS (12.5 MG TOTAL) BY MOUTH DAILY. (Patient taking differently: Take 12.5 mg by mouth daily as needed (palpitations). ) 45 tablet 2 Taking  . nicotine (NICODERM CQ) 21 mg/24hr patch Place 1 patch (21 mg total) onto the skin daily. (Patient not taking: Reported on 10/21/2018) 28 patch 0 Not Taking at Unknown time  . omeprazole (PRILOSEC) 20 MG capsule TAKE 1 CAPSULE BY MOUTH EVERY DAY IN THE MORNING (Patient taking differently: Take 20 mg by mouth daily. TAKE 1 CAPSULE BY MOUTH EVERY DAY IN THE MORNING) 90 capsule 0 Taking  . oxyCODONE-acetaminophen (PERCOCET) 7.5-325 MG tablet Take 1 tablet by mouth every 6 (six) hours as needed for severe pain. 80 tablet 0   . phenazopyridine (PYRIDIUM) 100 MG tablet Take 1 tablet (100 mg total) by  mouth 3 (three) times daily as needed for pain. (Patient not taking: Reported on 10/21/2018) 10 tablet 0 Not Taking at Unknown time  . simvastatin (ZOCOR) 10 MG tablet TAKE 1 TABLET BY MOUTH EVERY DAY IN THE MORNING (Patient taking differently: Take 10 mg by mouth daily. ) 90 tablet 1 Taking   No current facility-administered medications for this visit.    Allergies  Allergen Reactions  . Ace Inhibitors Cough  . Bentyl [Dicyclomine Hcl] Other (See Comments)    Blurry vision  . Penicillins Itching and Swelling    [place] Did it involve swelling of the face/tongue/throat, SOB, or low BP? No Did it involve sudden or severe rash/hives, skin peeling, or any reaction on the inside of your mouth or nose? No Did you need to seek medical attention at a hospital or doctor's office? No When did it last happen?20 years If all above answers are "NO", may proceed with cephalosporin use.     Social   History        Tobacco Use  . Smoking status: Current Every Day Smoker    Packs/day: 1.00    Years: 35.00    Pack years: 35.00    Types: Cigarettes  . Smokeless tobacco: Never Used  Substance Use Topics  . Alcohol use: No         Family History  Problem Relation Age of Onset  . Colon polyps Sister   . Cancer Mother        lung   . Cancer Father        stomach   . Stomach cancer Father        questionable  . Colon cancer Neg Hx       Review of Systems  Constitutional: Negative.   HENT: Negative.   Eyes: Negative.   Respiratory: Negative.   Cardiovascular: Negative.   Gastrointestinal: Negative.   Genitourinary: Negative.   Musculoskeletal: Positive for joint pain.  Skin: Negative.   Neurological: Negative.   Endo/Heme/Allergies: Negative.   Psychiatric/Behavioral: Negative.      Objective:  Physical Exam  Vitals reviewed. Constitutional: She is oriented to person, place, and time. She appears well-developed and well-nourished.  HENT:  Head:  Normocephalic.  Eyes: Pupils are equal, round, and reactive to light. Conjunctivae and EOM are normal.  Neck: Normal range of motion. Neck supple.  Cardiovascular: Normal rate, regular rhythm and intact distal pulses.  Respiratory: Effort normal. No respiratory distress.  GI: Soft. She exhibits no distension.  Genitourinary:    Genitourinary Comments: deferred   Musculoskeletal:     Left knee: She exhibits decreased range of motion and swelling. Tenderness found. Medial joint line and lateral joint line tenderness noted.       Legs:  Neurological: She is alert and oriented to person, place, and time. She has normal reflexes.  Skin: Skin is warm and dry.  Psychiatric: She has a normal mood and affect. Her behavior is normal. Judgment and thought content normal.    Vital signs in last 24 hours: @VSRANGES@  Labs:  Estimated body mass index is 36.85 kg/m as calculated from the following:   Height as of 10/09/18: 5' 3" (1.6 m).   Weight as of 10/09/18: 94.3 kg.  Imaging Review Plain radiographs demonstrate severe degenerative joint disease of the left knee(s). The overall alignment is neutral.There is evidence of loosening of the tibial components. The bone quality appears to be adequate for age and reported activity level.    Assessment/Plan:  End stage arthritis, left knee(s) with failed previous arthroplasty.   The patient history, physical examination, clinical judgment of the provider and imaging studies are consistent with end stage degenerative joint disease of the left knee(s), previous total knee arthroplasty. Revision total knee arthroplasty is deemed medically necessary. The treatment options including medical management, injection therapy, arthroscopy and revision arthroplasty were discussed at length. The risks and benefits of revision total knee arthroplasty were presented and reviewed. The risks due to aseptic loosening, infection, stiffness, patella tracking  problems, thromboembolic complications and other imponderables were discussed. The patient acknowledged the explanation, agreed to proceed with the plan and consent was signed. Patient is being admitted for inpatient treatment for surgery, pain control, PT, OT, prophylactic antibiotics, VTE prophylaxis, progressive ambulation and ADL's and discharge planning.The patient is planning to be discharged home with OPPT in [place].  

## 2018-10-22 NOTE — Progress Notes (Signed)
ekg 09-13-18 epic  hgba1c 09-13-18 epic  cxr 09-19-18 epic

## 2018-10-22 NOTE — Progress Notes (Signed)
Please place orders in epic pt. Is scheduled for a preop ! Thank You !

## 2018-10-22 NOTE — Patient Instructions (Signed)
Stephanie Hammond  10/22/2018   Your procedure is scheduled on: 10-30-18  Report to [facility] Main  Entrance              Report to short stay  at       0530  AM    Call this number if you have problems the morning of surgery [phone]    Remember: Do not eat food or drink liquids :After Midnight  BRUSH YOUR TEETH MORNING OF SURGERY AND RINSE YOUR MOUTH OUT, NO CHEWING GUM CANDY OR MINTS.     Take these medicines the morning of surgery with A SIP OF WATER: Simvastatin, omeprazole, metoprolol if needed, amlodipine, allopurinol  DO NOT TAKE ANY DIABETIC MEDICATIONS DAY OF YOUR SURGERY How to Manage Your Diabetes Before and After Surgery  Why is it important to control my blood sugar before and after surgery? . Improving blood sugar levels before and after surgery helps healing and can limit problems. . A way of improving blood sugar control is eating a healthy diet by: o  Eating less sugar and carbohydrates o  Increasing activity/exercise o  Talking with your doctor about reaching your blood sugar goals . High blood sugars (greater than 180 mg/dL) can raise your risk of infections and slow your recovery, so you will need to focus on controlling your diabetes during the weeks before surgery. . Make sure that the doctor who takes care of your diabetes knows about your planned surgery including the date and location.  How do I manage my blood sugar before surgery? . Check your blood sugar at least 4 times a day, starting 2 days before surgery, to make sure that the level is not too high or low. o Check your blood sugar the morning of your surgery when you wake up and every 2 hours until you get to the Short Stay unit. . If your blood sugar is less than 70 mg/dL, you will need to treat for low blood sugar: o Do not take insulin. o Treat a low blood sugar (less than 70 mg/dL) with  cup of clear juice (cranberry or apple), 4 glucose tablets, OR glucose  gel. o Recheck blood sugar in 15 minutes after treatment (to make sure it is greater than 70 mg/dL). If your blood sugar is not greater than 70 mg/dL on recheck, call [phone] for further instructions. . Report your blood sugar to the short stay nurse when you get to Short Stay.  . If you are admitted to the hospital after surgery: o Your blood sugar will be checked by the staff and you will probably be given insulin after surgery (instead of oral diabetes medicines) to make sure you have good blood sugar levels. o The goal for blood sugar control after surgery is 80-180 mg/dL.   WHAT DO I DO ABOUT MY DIABETES MEDICATION?  [name] Do not take oral diabetes medicines (pills) the morning of surgery.  . THE NIGHT BEFORE SURGERY, take  50% of your usual Tresbia dose       . THE MORNING OF SURGERY, take  o  units of         insulin.  . The day of surgery, do not take other diabetes injectables, including Byetta (exenatide), Bydureon (exenatide ER), Victoza (liraglutide), or Trulicity (dulaglutide).      Patient Signature:  Date:   Nurse Signature:  Date:   Reviewed and  Endorsed by [facility] Patient Education Committee, August 2015                               You may not have any metal on your body including hair pins and              piercings  Do not wear jewelry, make-up, lotions, powders or perfumes, deodorant             Do not wear nail polish.  Do not shave  48 hours prior to surgery.     Do not bring valuables to the hospital. [place].  Contacts, dentures or bridgework may not be worn into surgery.  Leave suitcase in the car. After surgery it may be brought to your room.     Patients discharged the day of surgery will not be allowed to drive home. IF YOU ARE HAVING SURGERY AND GOING HOME THE SAME DAY, YOU MUST HAVE AN ADULT TO DRIVE YOU HOME AND BE WITH YOU FOR 24 HOURS. YOU MAY GO HOME BY TAXI OR UBER OR ORTHERWISE, BUT AN  ADULT MUST ACCOMPANY YOU HOME AND STAY WITH YOU FOR 24 HOURS.  Name and phone number of your driver:  Special Instructions: N/A              Please read over the following fact sheets you were given: _____________________________________________________________________                            Stephanie Hammond  10/22/2018   Your procedure is scheduled on:   Report to [facility] Main  Entrance  Report to admitting at AM    Call this number if you have problems the morning of surgery [phone]   Remember: Do not eat food or drink liquids :After Midnight. BRUSH YOUR TEETH MORNING OF SURGERY AND RINSE YOUR MOUTH OUT, NO CHEWING GUM CANDY OR MINTS.     Take these medicines the morning of surgery with A SIP OF WATER:  DO NOT TAKE ANY DIABETIC MEDICATIONS DAY OF YOUR SURGERY                               You may not have any metal on your body including hair pins and              piercings  Do not wear jewelry, make-up, lotions, powders or perfumes, deodorant             Do not wear nail polish.  Do not shave  48 hours prior to surgery.              Men may shave face and neck.   Do not bring valuables to the hospital. [place].  Contacts, dentures or bridgework may not be worn into surgery.  Leave suitcase in the car. After surgery it may be brought to your room.                  Please read over the following fact sheets you were given: _____________________________________________________________________  WHAT IS A BLOOD TRANSFUSION? Blood Transfusion Information  A transfusion is the replacement of blood or some of its parts. Blood is made up of multiple cells which provide different functions.  Red blood cells carry oxygen and are used for blood loss replacement.  White blood cells fight against infection.  Platelets control bleeding.  Plasma helps clot blood.  Other blood products are  available for specialized needs, such as hemophilia or other clotting disorders. BEFORE THE TRANSFUSION  Who gives blood for transfusions?   Healthy volunteers who are fully evaluated to make sure their blood is safe. This is blood bank blood. Transfusion therapy is the safest it has ever been in the practice of medicine. Before blood is taken from a donor, a complete history is taken to make sure that person has no history of diseases nor engages in risky social behavior (examples are intravenous drug use or sexual activity with multiple partners). The donor's travel history is screened to minimize risk of transmitting infections, such as malaria. The donated blood is tested for signs of infectious diseases, such as HIV and hepatitis. The blood is then tested to be sure it is compatible with you in order to minimize the chance of a transfusion reaction. If you or a relative donates blood, this is often done in anticipation of surgery and is not appropriate for emergency situations. It takes many days to process the donated blood. RISKS AND COMPLICATIONS Although transfusion therapy is very safe and saves many lives, the main dangers of transfusion include:   Getting an infectious disease.  Developing a transfusion reaction. This is an allergic reaction to something in the blood you were given. Every precaution is taken to prevent this. The decision to have a blood transfusion has been considered carefully by your caregiver before blood is given. Blood is not given unless the benefits outweigh the risks. AFTER THE TRANSFUSION  Right after receiving a blood transfusion, you will usually feel much better and more energetic. This is especially true if your red blood cells have gotten low (anemic). The transfusion raises the level of the red blood cells which carry oxygen, and this usually causes an energy increase.  The nurse administering the transfusion will monitor you carefully for  complications. HOME CARE INSTRUCTIONS  No special instructions are needed after a transfusion. You may find your energy is better. Speak with your caregiver about any limitations on activity for underlying diseases you may have. SEEK MEDICAL CARE IF:   Your condition is not improving after your transfusion.  You develop redness or irritation at the intravenous (IV) site. SEEK IMMEDIATE MEDICAL CARE IF:  Any of the following symptoms occur over the next 12 hours:  Shaking chills.  You have a temperature by mouth above 102 F (38.9 C), not controlled by medicine.  Chest, back, or muscle pain.  People around you feel you are not acting correctly or are confused.  Shortness of breath or difficulty breathing.  Dizziness and fainting.  You get a rash or develop hives.  You have a decrease in urine output.  Your urine turns a dark color or changes to pink, red, or brown. Any of the following symptoms occur over the next 10 days:  You have a temperature by mouth above 102 F (38.9 C), not controlled by medicine.  Shortness of breath.  Weakness after normal activity.  The white part of the eye turns yellow (jaundice).  You have a decrease in the amount of urine or are  urinating less often.  Your urine turns a dark color or changes to pink, red, or brown. Document Released: 07/21/2000 Document Revised: 10/16/2011 Document Reviewed: 03/09/2008 ExitCare Patient Information 2014 [place].  _______________________________________________________________________  Incentive Spirometer  An incentive spirometer is a tool that can help keep your lungs clear and active. This tool measures how well you are filling your lungs with each breath. Taking long deep breaths may help reverse or decrease the chance of developing breathing (pulmonary) problems (especially infection) following:  A long period of time when you are unable to move or be active. BEFORE THE PROCEDURE   If  the spirometer includes an indicator to show your best effort, your nurse or respiratory therapist will set it to a desired goal.  If possible, sit up straight or lean slightly forward. Try not to slouch.  Hold the incentive spirometer in an upright position. INSTRUCTIONS FOR USE  1. Sit on the edge of your bed if possible, or sit up as far as you can in bed or on a chair. 2. Hold the incentive spirometer in an upright position. 3. Breathe out normally. 4. Place the mouthpiece in your mouth and seal your lips tightly around it. 5. Breathe in slowly and as deeply as possible, raising the piston or the ball toward the top of the column. 6. Hold your breath for 3-5 seconds or for as long as possible. Allow the piston or ball to fall to the bottom of the column. 7. Remove the mouthpiece from your mouth and breathe out normally. 8. Rest for a few seconds and repeat Steps 1 through 7 at least 10 times every 1-2 hours when you are awake. Take your time and take a few normal breaths between deep breaths. 9. The spirometer may include an indicator to show your best effort. Use the indicator as a goal to work toward during each repetition. 10. After each set of 10 deep breaths, practice coughing to be sure your lungs are clear. If you have an incision (the cut made at the time of surgery), support your incision when coughing by placing a pillow or rolled up towels firmly against it. Once you are able to get out of bed, walk around indoors and cough well. You may stop using the incentive spirometer when instructed by your caregiver.  RISKS AND COMPLICATIONS  Take your time so you do not get dizzy or light-headed.  If you are in pain, you may need to take or ask for pain medication before doing incentive spirometry. It is harder to take a deep breath if you are having pain. AFTER USE  Rest and breathe slowly and easily.  It can be helpful to keep track of a log of your progress. Your caregiver can  provide you with a simple table to help with this. If you are using the spirometer at home, follow these instructions: [place] IF:   You are having difficultly using the spirometer.  You have trouble using the spirometer as often as instructed.  Your pain medication is not giving enough relief while using the spirometer.  You develop fever of 100.5 F (38.1 C) or higher. SEEK IMMEDIATE MEDICAL CARE IF:   You cough up bloody sputum that had not been present before.  You develop fever of 102 F (38.9 C) or greater.  You develop worsening pain at or near the incision site. MAKE SURE YOU:   Understand these instructions.  Will watch your condition.  Will get help right  away if you are not doing well or get worse. Document Released: 12/04/2006 Document Revised: 10/16/2011 Document Reviewed: 02/04/2007 [facility] Patient Information 2014 [place], [place].   ________________________________________________________________________

## 2018-10-24 ENCOUNTER — Encounter (HOSPITAL_COMMUNITY)
Admission: RE | Admit: 2018-10-24 | Discharge: 2018-10-24 | Disposition: A | Discharge status: Home / Self Care | Payer: Medicare HMO | Source: Ambulatory Visit | Attending: Family Medicine | Admitting: Family Medicine

## 2018-10-28 ENCOUNTER — Telehealth: Payer: Self-pay | Admitting: *Deleted

## 2018-10-28 NOTE — Telephone Encounter (Signed)
Received VM from patient.   Reports that vaginal discharge is beginning again. States that pain has also returned.  Call placed to patient. [place].   Patient will need OV to eval.

## 2018-10-28 NOTE — Telephone Encounter (Signed)
Patient returned call. States that she has scheduled OV with GYN (Dr. Elonda Husky) for Wed, 10/30/2018.

## 2018-10-30 ENCOUNTER — Inpatient Hospital Stay: Admit: 2018-10-30 | Payer: Medicare HMO | Admitting: Orthopedic Surgery

## 2018-10-30 ENCOUNTER — Encounter: Payer: Medicare HMO | Admitting: Obstetrics & Gynecology

## 2018-11-04 ENCOUNTER — Other Ambulatory Visit: Payer: Self-pay | Admitting: *Deleted

## 2018-11-04 ENCOUNTER — Other Ambulatory Visit: Payer: Self-pay | Admitting: Family Medicine

## 2018-11-04 ENCOUNTER — Ambulatory Visit: Payer: Medicare HMO | Admitting: Physical Therapy

## 2018-11-04 NOTE — Telephone Encounter (Signed)
Received call from patient.   Requested refill on Oxycodone/APAP.   Ok to refill??  Last office visit 10/09/2018.  Last refill 10/15/2018.

## 2018-11-05 ENCOUNTER — Encounter: Payer: Medicare HMO | Admitting: Obstetrics & Gynecology

## 2018-11-08 ENCOUNTER — Other Ambulatory Visit: Payer: Self-pay | Admitting: Family Medicine

## 2018-11-08 NOTE — Telephone Encounter (Signed)
Patient calling to get refill on her oxycodone  Please send to [facility]  Would like today if possible

## 2018-11-11 MED ORDER — OXYCODONE-ACETAMINOPHEN 7.5-325 MG PO TABS: 1.0000 | ORAL_TABLET | Freq: Four times a day (QID) | ORAL | 0 refills | Status: DC | PRN

## 2018-11-11 NOTE — Telephone Encounter (Signed)
Ok to refill??  Last office visit 10/09/2018.  Last refill 10/15/2018.

## 2018-11-11 NOTE — Telephone Encounter (Signed)
Please let pt know her medication must last 30 days, I am not going to give early refills, we have discussed this before, but so she knows. She can pick this script up on 4/9

## 2018-11-12 ENCOUNTER — Telehealth: Payer: Self-pay | Admitting: *Deleted

## 2018-11-12 NOTE — Telephone Encounter (Signed)
Pt is having bowel movements every 15 to 20 minutes through out the day.  Pt is having bowel colored dark discharge.  Pt can't tell if it is coming from the front or back.

## 2018-11-12 NOTE — Telephone Encounter (Signed)
Spoke with pt. She has been seeing her PCP and was treated for a bacterial infection. Pt said she wasn't sure if she was having a bowel movement or bleeding. She then said she had bleeding for several weeks and she just got off an antibiotic for a bacterial or bladder infection. Pt said she had bleeding when she saw her doctor. Pt was asked to call her doctor or gyn doctor since she is being treated by them for this problem.

## 2018-11-19 ENCOUNTER — Telehealth: Payer: Self-pay | Admitting: *Deleted

## 2018-11-19 ENCOUNTER — Encounter: Payer: Medicare HMO | Admitting: Obstetrics & Gynecology

## 2018-11-19 NOTE — Telephone Encounter (Signed)

## 2018-11-20 ENCOUNTER — Other Ambulatory Visit: Payer: Self-pay

## 2018-11-20 ENCOUNTER — Encounter: Payer: Self-pay | Admitting: Obstetrics and Gynecology

## 2018-11-20 ENCOUNTER — Ambulatory Visit (INDEPENDENT_AMBULATORY_CARE_PROVIDER_SITE_OTHER): Payer: Medicare HMO | Admitting: Obstetrics and Gynecology

## 2018-11-20 VITALS — BP 123/79 | HR 116 | Temp 99.0°F | Ht 63.0 in | Wt 210.4 lb

## 2018-11-20 DIAGNOSIS — N898 Other specified noninflammatory disorders of vagina: Secondary | ICD-10-CM | POA: Diagnosis not present

## 2018-11-20 DIAGNOSIS — R1900 Intra-abdominal and pelvic swelling, mass and lump, unspecified site: Secondary | ICD-10-CM | POA: Diagnosis not present

## 2018-11-20 NOTE — Progress Notes (Signed)
[facility] Visit  @DATE @            Patient name: Stephanie Hammond MRN [id number]  Date of birth: [birth year]  CC & HPI:  Stephanie Hammond is a 68 y.o. female presenting todAY FOR  VAGINAL PAIN , AS DESCRIBED BY C SIX.LPN ON 11/13/8117."Reports that vaginal discharge is beginning again. States that pain has also returned.  Call placed to patient. LMTRC.   Patient will need OV to eval.   "  ROS:  Had left knee replacement scheduled at ~June, will likely be scheduled rescheduled further   Pertinent History Reviewed:   Reviewed: Significant for hysterectomy 44 years ago ovaries were preserved cervix was removed Medical         Past Medical History:  Diagnosis Date  . Anemia   . Blood transfusion 2009   after GI bleed  . CAD (coronary artery disease)   . Diabetes mellitus   . Diverticulitis   . Diverticulosis   . GERD (gastroesophageal reflux disease)   . Gout   . History of GI diverticular bleed 08/2004  . Hypertension   . IBS (irritable bowel syndrome)   . Osteoarthritis                               Surgical Hx:    Past Surgical History:  Procedure Laterality Date  . CHOLECYSTECTOMY  2003  . COLONOSCOPY  2000   Dr. Russella Dar: marked diverticulosis, difficult procedure due to adhesions, polyps benign  . COLONOSCOPY  2006   Dr. Marina Goodell: severe pandiverticulosis  . COLONOSCOPY N/A 02/03/2013   SLF: 4 COLORECTAL POLYPS REMOVED/Moderate diverticulosis throughout the entire examined colon/Small internal hemorrhoids  . JOINT REPLACEMENT Right 2009   Knee Replacement Right   . PTCA    . REPLACEMENT TOTAL KNEE     right   . TOTAL KNEE ARTHROPLASTY Left 03/12/2015   Procedure: LEFT TOTAL KNEE REPLACEMENT;  Surgeon: Vickki Hearing, MD;  Location: [facility];  Service: Orthopedics;  Laterality: Left;  [name] VAGINAL HYSTERECTOMY    Possibly abdominal hysterectomy has midline lower abdominal scar Medications: Reviewed & Updated - see associated section                        Current Outpatient Medications:  .  allopurinol (ZYLOPRIM) 300 MG tablet, TAKE 1 TABLET BY MOUTH EVERY DAY (Patient taking differently: Take 300 mg by mouth daily. ), Disp: 90 tablet, Rfl: 1 .  amLODipine (NORVASC) 10 MG tablet, TAKE 1 TABLET BY MOUTH EVERY DAY (Patient taking differently: Take 10 mg by mouth daily. ), Disp: 90 tablet, Rfl: 1 .  atorvastatin (LIPITOR) 20 MG tablet, Take 1 tablet (20 mg total) by mouth daily., Disp: 90 tablet, Rfl: 3 .  glucose blood (ONE TOUCH ULTRA TEST) test strip, USE AS DIRECTED TO TEST THREE TIMES DAILY, Disp: 300 each, Rfl: 3 .  insulin degludec (TRESIBA FLEXTOUCH) 100 UNIT/ML SOPN FlexTouch Pen, Inject 0.12 mLs (12 Units total) into the skin daily at 10 pm., Disp: 2 pen, Rfl: 2 .  Insulin Pen Needle (PEN NEEDLES 3/16") 31G X 5 MM MISC, Inject 1 each into the skin daily. Use as directed to inject insulin daily., Disp: 100 each, Rfl: 3 .  Lancets MISC, Use as directed., Disp: 100 each, Rfl: 11 .  losartan (COZAAR) 100 MG tablet, TAKE 1 TABLET BY MOUTH EVERY DAY (Patient taking  differently: Take 100 mg by mouth daily. ), Disp: 90 tablet, Rfl: 1 .  meloxicam (MOBIC) 7.5 MG tablet, TAKE 1 TABLET BY MOUTH EVERY DAY (Patient taking differently: Take 7.5 mg by mouth daily. ), Disp: 90 tablet, Rfl: 1 .  metFORMIN (GLUCOPHAGE-XR) 500 MG 24 hr tablet, TAKE 2 TABLETS(1000 MG) BY MOUTH DAILY WITH BREAKFAST, Disp: 180 tablet, Rfl: 2 .  omeprazole (PRILOSEC) 20 MG capsule, TAKE 1 CAPSULE BY MOUTH EVERY DAY IN THE MORNING (Patient taking differently: Take 20 mg by mouth daily. TAKE 1 CAPSULE BY MOUTH EVERY DAY IN THE MORNING), Disp: 90 capsule, Rfl: 0 .  oxyCODONE-acetaminophen (PERCOCET) 7.5-325 MG tablet, Take 1 tablet by mouth every 6 (six) hours as needed for severe pain., Disp: 80 tablet, Rfl: 0 .  simvastatin (ZOCOR) 10 MG tablet, TAKE 1 TABLET BY MOUTH EVERY DAY IN THE MORNING (Patient taking differently: Take 10 mg by mouth daily. ), Disp: 90 tablet, Rfl: 1 .   ciprofloxacin (CIPRO) 500 MG tablet, Take 1 tablet (500 mg total) by mouth 2 (two) times daily. (Patient not taking: Reported on 10/21/2018), Disp: 14 tablet, Rfl: 0 .  fluconazole (DIFLUCAN) 150 MG tablet, Take 1 tablet repeat in 3 days (Patient not taking: Reported on 10/21/2018), Disp: 2 tablet, Rfl: 1 .  metoprolol succinate (TOPROL-XL) 25 MG 24 hr tablet, TAKE 0.5 TABLETS (12.5 MG TOTAL) BY MOUTH DAILY. (Patient not taking: Reported on 11/20/2018), Disp: 45 tablet, Rfl: 2 .  nicotine (NICODERM CQ) 21 mg/24hr patch, Place 1 patch (21 mg total) onto the skin daily. (Patient not taking: Reported on 10/21/2018), Disp: 28 patch, Rfl: 0 .  phenazopyridine (PYRIDIUM) 100 MG tablet, Take 1 tablet (100 mg total) by mouth 3 (three) times daily as needed for pain. (Patient not taking: Reported on 10/21/2018), Disp: 10 tablet, Rfl: 0   Social History: Reviewed -  reports that she has been smoking cigarettes. She has a 35.00 pack-year smoking history. She has never used smokeless tobacco.  Objective Findings:  Vitals: Blood pressure 123/79, pulse (!) 116, temperature 99 F (37.2 C), height 5\' 3"  (1.6 m), weight 95.4 kg.  PHYSICAL EXAMINATION Body mass index is 37.27 kg/m.\  General appearance - alert, well appearing, and in no distress, oriented to person, place, and time and overweight Mental status - alert, oriented to person, place, and time, normal mood, behavior, speech, dress, motor activity, and thought processes Chest -  Heart -  Abdomen - tenderness noted suprapubic area cannot feel a mass per abdomen does have midline lower abdominal scar from prior hysterectomy Breasts -  Skin - normal coloration and turgor, no rashes, no suspicious skin lesions noted  PELVIC External genitalia -normal female Vulva -normal Vagina -light secretions appear grossly normal, wet prep performed Cervix - surgically absent} Uterus -surgically absent Adnexa -bimanual exam identifies a hard firm mass-effect in the  left lower quadrant which reproduces the pain on the left side Wet [place] -normal epithelial cells no trichomonas no yeast occasional clue cell Rectal - rectal exam not indicated    Assessment & Plan:   A:  1. Pelvic mass rule out ovarian origin 2. Normal vaginal discharge  P:  1. We will check Ca1 25 order pelvic ultrasound. 2. CT scan likely to follow-up

## 2018-12-02 ENCOUNTER — Telehealth: Payer: Self-pay | Admitting: Obstetrics and Gynecology

## 2018-12-02 NOTE — Telephone Encounter (Signed)
Pt wanting to see if she can be seen for bad discharge. States she is also having vaginal pain.

## 2018-12-02 NOTE — Telephone Encounter (Signed)
Spoke with pt. Pt has a vaginal discharge with odor. Pt also has vaginal pain. Advised can schedule an appt. Attempted to transfer call but advised to call back if we got disconnected. Pt voiced understanding. [place]

## 2018-12-03 ENCOUNTER — Ambulatory Visit (INDEPENDENT_AMBULATORY_CARE_PROVIDER_SITE_OTHER): Payer: Medicare HMO | Admitting: Obstetrics & Gynecology

## 2018-12-03 ENCOUNTER — Other Ambulatory Visit: Payer: Self-pay

## 2018-12-03 ENCOUNTER — Encounter: Payer: Self-pay | Admitting: Obstetrics & Gynecology

## 2018-12-03 VITALS — BP 133/82 | HR 115 | Ht 63.0 in | Wt 209.0 lb

## 2018-12-03 DIAGNOSIS — R1900 Intra-abdominal and pelvic swelling, mass and lump, unspecified site: Secondary | ICD-10-CM | POA: Diagnosis not present

## 2018-12-03 DIAGNOSIS — R1904 Left lower quadrant abdominal swelling, mass and lump: Secondary | ICD-10-CM | POA: Diagnosis not present

## 2018-12-03 NOTE — Progress Notes (Signed)
Chief Complaint  Patient presents with  . Vaginal Discharge    and pain      68 y.o. [id number] No LMP recorded. Patient has had a hysterectomy. The current method of family planning is status post hysterectomy and post menopausal status.  Outpatient Encounter Medications as of 12/03/2018  Medication Sig  . atorvastatin (LIPITOR) 20 MG tablet Take 1 tablet (20 mg total) by mouth daily.  [name] glucose blood (ONE TOUCH ULTRA TEST) test strip USE AS DIRECTED TO TEST THREE TIMES DAILY  . insulin degludec (TRESIBA FLEXTOUCH) 100 UNIT/ML SOPN FlexTouch Pen Inject 0.12 mLs (12 Units total) into the skin daily at 10 pm.  . Insulin Pen Needle (PEN NEEDLES 3/16") 31G X 5 MM MISC Inject 1 each into the skin daily. Use as directed to inject insulin daily.  . Lancets MISC Use as directed.  . metFORMIN (GLUCOPHAGE-XR) 500 MG 24 hr tablet TAKE 2 TABLETS(1000 MG) BY MOUTH DAILY WITH BREAKFAST  . oxyCODONE-acetaminophen (PERCOCET) 7.5-325 MG tablet Take 1 tablet by mouth every 6 (six) hours as needed for severe pain.  [name] allopurinol (ZYLOPRIM) 300 MG tablet TAKE 1 TABLET BY MOUTH EVERY DAY (Patient taking differently: Take 300 mg by mouth daily. )  . amLODipine (NORVASC) 10 MG tablet TAKE 1 TABLET BY MOUTH EVERY DAY (Patient taking differently: Take 10 mg by mouth daily. )  . ciprofloxacin (CIPRO) 500 MG tablet Take 1 tablet (500 mg total) by mouth 2 (two) times daily. (Patient not taking: Reported on 10/21/2018)  . fluconazole (DIFLUCAN) 150 MG tablet Take 1 tablet repeat in 3 days (Patient not taking: Reported on 10/21/2018)  . losartan (COZAAR) 100 MG tablet TAKE 1 TABLET BY MOUTH EVERY DAY (Patient taking differently: Take 100 mg by mouth daily. )  . meloxicam (MOBIC) 7.5 MG tablet TAKE 1 TABLET BY MOUTH EVERY DAY (Patient taking differently: Take 7.5 mg by mouth daily. )  . metoprolol succinate (TOPROL-XL) 25 MG 24 hr tablet TAKE 0.5 TABLETS (12.5 MG TOTAL) BY MOUTH DAILY. (Patient not taking: Reported  on 11/20/2018)  . nicotine (NICODERM CQ) 21 mg/24hr patch Place 1 patch (21 mg total) onto the skin daily. (Patient not taking: Reported on 10/21/2018)  . omeprazole (PRILOSEC) 20 MG capsule TAKE 1 CAPSULE BY MOUTH EVERY DAY IN THE MORNING (Patient taking differently: Take 20 mg by mouth daily. TAKE 1 CAPSULE BY MOUTH EVERY DAY IN THE MORNING)  . phenazopyridine (PYRIDIUM) 100 MG tablet Take 1 tablet (100 mg total) by mouth 3 (three) times daily as needed for pain. (Patient not taking: Reported on 10/21/2018)  . simvastatin (ZOCOR) 10 MG tablet TAKE 1 TABLET BY MOUTH EVERY DAY IN THE MORNING (Patient taking differently: Take 10 mg by mouth daily. )   No facility-administered encounter medications on file as of 12/03/2018.     Subjective Pt was seen in office 2 weeks ago for similar complaint of abdominal pain Dr Glo Herring felt a mass and ordered CA 125 Since then pain has increased No N/V but increased pain No bleeding fever or chills Some discharge Past Medical History:  Diagnosis Date  . Anemia   . Blood transfusion 2009   after GI bleed  . CAD (coronary artery disease)   . Diabetes mellitus   . Diverticulitis   . Diverticulosis   . GERD (gastroesophageal reflux disease)   . Gout   . History of GI diverticular bleed 08/2004  . Hypertension   . IBS (irritable bowel syndrome)   .  Osteoarthritis     Past Surgical History:  Procedure Laterality Date  . CHOLECYSTECTOMY  2003  . COLONOSCOPY  2000   Dr. Fuller Plan: marked diverticulosis, difficult procedure due to adhesions, polyps benign  . COLONOSCOPY  2006   Dr. Henrene Pastor: severe pandiverticulosis  . COLONOSCOPY N/A 02/03/2013   SLF: 4 COLORECTAL POLYPS REMOVED/Moderate diverticulosis throughout the entire examined colon/Small internal hemorrhoids  . JOINT REPLACEMENT Right 2009   Knee Replacement Right   . PTCA    . REPLACEMENT TOTAL KNEE     right   . TOTAL KNEE ARTHROPLASTY Left 03/12/2015   Procedure: LEFT TOTAL KNEE REPLACEMENT;   Surgeon: Carole Civil, MD;  Location: [facility];  Service: Orthopedics;  Laterality: Left;  [name] VAGINAL HYSTERECTOMY      OB History    Gravida  2   Para  2   Term  2   Preterm      AB      Living  2     SAB      TAB      Ectopic      Multiple      Live Births              Allergies  Allergen Reactions  . Ace Inhibitors Cough  . Bentyl [Dicyclomine Hcl] Other (See Comments)    Blurry vision  . Penicillins Itching and Swelling    [place] Did it involve swelling of the face/tongue/throat, SOB, or low BP? No Did it involve sudden or severe rash/hives, skin peeling, or any reaction on the inside of your mouth or nose? No Did you need to seek medical attention at a hospital or doctor's office? No When did it last happen?20 years If all above answers are "NO", may proceed with cephalosporin use.     Social History   Socioeconomic History  . Marital status: Married    Spouse name: Not on file  . Number of children: 2  . Years of education: Not on file  . Highest education level: Not on file  Occupational History  . Not on file  Social Needs  . Financial resource strain: Not on file  . Food insecurity:    Worry: Not on file    Inability: Not on file  . Transportation needs:    Medical: Not on file    Non-medical: Not on file  Tobacco Use  . Smoking status: Current Every Day Smoker    Packs/day: 1.00    Years: 35.00    Pack years: 35.00    Types: Cigarettes  . Smokeless tobacco: Never Used  Substance and Sexual Activity  . Alcohol use: No  . Drug use: No  . Sexual activity: Never  Lifestyle  . Physical activity:    Days per week: Not on file    Minutes per session: Not on file  . Stress: Not on file  Relationships  . Social connections:    Talks on phone: Not on file    Gets together: Not on file    Attends religious service: Not on file    Active member of club or organization: Not on file    Attends meetings of clubs or  organizations: Not on file    Relationship status: Not on file  Other Topics Concern  . Not on file  Social History Narrative  . Not on file    Family History  Problem Relation Age of Onset  . Colon polyps Sister   . Cancer Mother  lung   . Cancer Father        stomach   . Stomach cancer Father        questionable  . Colon cancer Neg Hx     Medications:       Current Outpatient Medications:  .  atorvastatin (LIPITOR) 20 MG tablet, Take 1 tablet (20 mg total) by mouth daily., Disp: 90 tablet, Rfl: 3 .  glucose blood (ONE TOUCH ULTRA TEST) test strip, USE AS DIRECTED TO TEST THREE TIMES DAILY, Disp: 300 each, Rfl: 3 .  insulin degludec (TRESIBA FLEXTOUCH) 100 UNIT/ML SOPN FlexTouch Pen, Inject 0.12 mLs (12 Units total) into the skin daily at 10 pm., Disp: 2 pen, Rfl: 2 .  Insulin Pen Needle (PEN NEEDLES 3/16") 31G X 5 MM MISC, Inject 1 each into the skin daily. Use as directed to inject insulin daily., Disp: 100 each, Rfl: 3 .  Lancets MISC, Use as directed., Disp: 100 each, Rfl: 11 .  metFORMIN (GLUCOPHAGE-XR) 500 MG 24 hr tablet, TAKE 2 TABLETS(1000 MG) BY MOUTH DAILY WITH BREAKFAST, Disp: 180 tablet, Rfl: 2 .  oxyCODONE-acetaminophen (PERCOCET) 7.5-325 MG tablet, Take 1 tablet by mouth every 6 (six) hours as needed for severe pain., Disp: 80 tablet, Rfl: 0 .  allopurinol (ZYLOPRIM) 300 MG tablet, TAKE 1 TABLET BY MOUTH EVERY DAY (Patient taking differently: Take 300 mg by mouth daily. ), Disp: 90 tablet, Rfl: 1 .  amLODipine (NORVASC) 10 MG tablet, TAKE 1 TABLET BY MOUTH EVERY DAY (Patient taking differently: Take 10 mg by mouth daily. ), Disp: 90 tablet, Rfl: 1 .  ciprofloxacin (CIPRO) 500 MG tablet, Take 1 tablet (500 mg total) by mouth 2 (two) times daily. (Patient not taking: Reported on 10/21/2018), Disp: 14 tablet, Rfl: 0 .  fluconazole (DIFLUCAN) 150 MG tablet, Take 1 tablet repeat in 3 days (Patient not taking: Reported on 10/21/2018), Disp: 2 tablet, Rfl: 1 .  losartan  (COZAAR) 100 MG tablet, TAKE 1 TABLET BY MOUTH EVERY DAY (Patient taking differently: Take 100 mg by mouth daily. ), Disp: 90 tablet, Rfl: 1 .  meloxicam (MOBIC) 7.5 MG tablet, TAKE 1 TABLET BY MOUTH EVERY DAY (Patient taking differently: Take 7.5 mg by mouth daily. ), Disp: 90 tablet, Rfl: 1 .  metoprolol succinate (TOPROL-XL) 25 MG 24 hr tablet, TAKE 0.5 TABLETS (12.5 MG TOTAL) BY MOUTH DAILY. (Patient not taking: Reported on 11/20/2018), Disp: 45 tablet, Rfl: 2 .  nicotine (NICODERM CQ) 21 mg/24hr patch, Place 1 patch (21 mg total) onto the skin daily. (Patient not taking: Reported on 10/21/2018), Disp: 28 patch, Rfl: 0 .  omeprazole (PRILOSEC) 20 MG capsule, TAKE 1 CAPSULE BY MOUTH EVERY DAY IN THE MORNING (Patient taking differently: Take 20 mg by mouth daily. TAKE 1 CAPSULE BY MOUTH EVERY DAY IN THE MORNING), Disp: 90 capsule, Rfl: 0 .  phenazopyridine (PYRIDIUM) 100 MG tablet, Take 1 tablet (100 mg total) by mouth 3 (three) times daily as needed for pain. (Patient not taking: Reported on 10/21/2018), Disp: 10 tablet, Rfl: 0 .  simvastatin (ZOCOR) 10 MG tablet, TAKE 1 TABLET BY MOUTH EVERY DAY IN THE MORNING (Patient taking differently: Take 10 mg by mouth daily. ), Disp: 90 tablet, Rfl: 1  Objective Blood pressure 133/82, pulse (!) 115, height 5\' 3"  (1.6 m), weight 209 lb (94.8 kg).  Gen mild distress Abdomen soft tender to palapation no rebound general pain Vulva negative Vagina intact no masses minimal discharge Bimanual I also feel a pelvic abdominal mass  semingly arising from LLQ  Pertinent ROS Per HPI  Labs or studies No new    Impression Diagnoses this Encounter::   ICD-10-CM   1. Left lower quadrant abdominal mass R19.04 CA 125  2. Intra-abdominal and pelvic swelling, mass and lump, unspecified site R19.00 CA 125    CT ABDOMEN PELVIS W CONTRAST    CT ABDOMEN PELVIS W CONTRAST    CANCELED: CT ABDOMEN PELVIS W CONTRAST    CANCELED: CT ABDOMEN PELVIS W CONTRAST  3. Pelvic  mass in female R19.00     Established relevant diagnosis(es):   Plan/Recommendations: No orders of the defined types were placed in this encounter.   Labs or Scans Ordered: Orders Placed This Encounter  Procedures  . CT ABDOMEN PELVIS W CONTRAST  . CT ABDOMEN PELVIS W CONTRAST  . CA 125    Management:: >CT scan ordered  >CA 125 today  Cancel next weeks scheduled sonogram + appointment pending CT and CA 125 results  Follow up Return if symptoms worsen or fail to improve.       All questions were answered.

## 2018-12-04 LAB — COMPREHENSIVE METABOLIC PANEL
ALT: 17 IU/L (ref 0–32)
AST: 13 IU/L (ref 0–40)
Albumin/Globulin Ratio: 1.5 (ref 1.2–2.2)
Albumin: 4.2 g/dL (ref 3.8–4.8)
Alkaline Phosphatase: 131 IU/L — ABNORMAL HIGH (ref 39–117)
BUN/Creatinine Ratio: 22 (ref 12–28)
BUN: 20 mg/dL (ref 8–27)
Bilirubin Total: 0.2 mg/dL (ref 0.0–1.2)
CO2: 20 mmol/L (ref 20–29)
Calcium: 9.7 mg/dL (ref 8.7–10.3)
Chloride: 104 mmol/L (ref 96–106)
Creatinine, Ser: 0.92 mg/dL (ref 0.57–1.00)
GFR calc Af Amer: 76 mL/min/{1.73_m2} (ref >59)
GFR calc non Af Amer: 66 mL/min/{1.73_m2} (ref >59)
Globulin, Total: 2.8 g/dL (ref 1.5–4.5)
Glucose: 111 mg/dL — ABNORMAL HIGH (ref 65–99)
Potassium: 5.3 mmol/L — ABNORMAL HIGH (ref 3.5–5.2)
Sodium: 140 mmol/L (ref 134–144)
Total Protein: 7 g/dL (ref 6.0–8.5)

## 2018-12-04 LAB — CA 125: Cancer Antigen (CA) 125: 9.7 U/mL (ref 0.0–38.1)

## 2018-12-05 ENCOUNTER — Ambulatory Visit (HOSPITAL_COMMUNITY)
Admission: RE | Admit: 2018-12-05 | Discharge: 2018-12-05 | Disposition: A | Discharge status: Home / Self Care | Payer: Medicare HMO | Source: Ambulatory Visit | Attending: Obstetrics & Gynecology | Admitting: Obstetrics & Gynecology

## 2018-12-05 ENCOUNTER — Other Ambulatory Visit: Payer: Self-pay

## 2018-12-05 DIAGNOSIS — R1900 Intra-abdominal and pelvic swelling, mass and lump, unspecified site: Secondary | ICD-10-CM | POA: Insufficient documentation

## 2018-12-05 DIAGNOSIS — K5732 Diverticulitis of large intestine without perforation or abscess without bleeding: Secondary | ICD-10-CM | POA: Diagnosis not present

## 2018-12-05 MED ORDER — IOHEXOL 300 MG/ML  SOLN
100.0000 mL | Freq: Once | INTRAMUSCULAR | Status: AC | PRN
  Administered 2018-12-05: 100 mL via INTRAVENOUS

## 2018-12-06 ENCOUNTER — Ambulatory Visit (INDEPENDENT_AMBULATORY_CARE_PROVIDER_SITE_OTHER): Payer: Medicare HMO | Admitting: Obstetrics & Gynecology

## 2018-12-06 ENCOUNTER — Telehealth: Payer: Self-pay | Admitting: Obstetrics & Gynecology

## 2018-12-06 DIAGNOSIS — K5732 Diverticulitis of large intestine without perforation or abscess without bleeding: Secondary | ICD-10-CM | POA: Diagnosis not present

## 2018-12-06 DIAGNOSIS — R1032 Left lower quadrant pain: Secondary | ICD-10-CM

## 2018-12-06 MED ORDER — CIPROFLOXACIN HCL 500 MG PO TABS: 500.0000 mg | ORAL_TABLET | Freq: Two times a day (BID) | ORAL | 0 refills | Status: DC

## 2018-12-06 MED ORDER — METRONIDAZOLE 500 MG PO TABS: 500.0000 mg | ORAL_TABLET | Freq: Two times a day (BID) | ORAL | 0 refills | Status: DC

## 2018-12-06 NOTE — Telephone Encounter (Signed)
Pt would like to see if she can have a call with her CT results if they are resulted.

## 2018-12-06 NOTE — Progress Notes (Signed)
TELEHEALTH VIRTUAL GYNECOLOGY VISIT ENCOUNTER NOTE:  Results encounter with management required  I connected with@ on 12/06/18 at 12:30 PM EDT by telephone at home and verified that I am speaking with the correct person using two identifiers.   I discussed the limitations, risks, security and privacy concerns of performing an evaluation and management service by telephone and the availability of in person appointments. I also discussed with the patient that there may be a patient responsible charge related to this service. The patient expressed understanding and agreed to proceed.   History:  Stephanie Hammond is a 68 y.o. G6P2002 female being evaluated today for abdominal pain and mass effect on pelvic exam. She denies any abnormal vaginal discharge, bleeding, pelvic pain or other concerns.    she states when Dr Hassell Done didi her hysterectomy he took everything out, no ovaries but there is a mass felt on left adnexa  CT ordered which shows diverticulosis and diverticulitis, see below  CA 125 is normal   Past Medical History:  Diagnosis Date  . Anemia   . Blood transfusion 2009   after GI bleed  . CAD (coronary artery disease)   . Diabetes mellitus   . Diverticulitis   . Diverticulosis   . GERD (gastroesophageal reflux disease)   . Gout   . History of GI diverticular bleed 08/2004  . Hypertension   . IBS (irritable bowel syndrome)   . Osteoarthritis    Past Surgical History:  Procedure Laterality Date  . CHOLECYSTECTOMY  2003  . COLONOSCOPY  2000   Dr. Fuller Plan: marked diverticulosis, difficult procedure due to adhesions, polyps benign  . COLONOSCOPY  2006   Dr. Henrene Pastor: severe pandiverticulosis  . COLONOSCOPY N/A 02/03/2013   SLF: 4 COLORECTAL POLYPS REMOVED/Moderate diverticulosis throughout the entire examined colon/Small internal hemorrhoids  . JOINT REPLACEMENT Right 2009   Knee Replacement Right   . PTCA    . REPLACEMENT TOTAL KNEE     right   . TOTAL KNEE ARTHROPLASTY  Left 03/12/2015   Procedure: LEFT TOTAL KNEE REPLACEMENT;  Surgeon: Carole Civil, MD;  Location: [facility];  Service: Orthopedics;  Laterality: Left;  [name] VAGINAL HYSTERECTOMY     The following portions of the patient's history were reviewed and updated as appropriate: allergies, current medications, past family history, past medical history, past social history, past surgical history and problem list.   Health Maintenance:   Review of Systems:  Pertinent items noted in HPI and remainder of comprehensive ROS otherwise negative.  Physical Exam:  Physical exam deferred due to nature of the encounter  Labs and Imaging Results for orders placed or performed in visit on 12/03/18 (from the past 336 hour(s))  CA 125   Collection Time: 12/03/18 12:05 PM  Result Value Ref Range   Cancer Antigen (CA) 125 9.7 0.0 - 38.1 U/mL  Comprehensive metabolic panel   Collection Time: 12/03/18 12:05 PM  Result Value Ref Range   Glucose 111 (H) 65 - 99 mg/dL   BUN 20 8 - 27 mg/dL   Creatinine, Ser 0.92 0.57 - 1.00 mg/dL   GFR calc non Af Amer 66 >59 mL/min/1.73   GFR calc Af Amer 76 >59 mL/min/1.73   BUN/Creatinine Ratio 22 12 - 28   Sodium 140 134 - 144 mmol/L   Potassium 5.3 (H) 3.5 - 5.2 mmol/L   Chloride 104 96 - 106 mmol/L   CO2 20 20 - 29 mmol/L   Calcium 9.7 8.7 - 10.3 mg/dL  Total Protein 7.0 6.0 - 8.5 g/dL   Albumin 4.2 3.8 - 4.8 g/dL   Globulin, Total 2.8 1.5 - 4.5 g/dL   Albumin/Globulin Ratio 1.5 1.2 - 2.2   Bilirubin Total 0.2 0.0 - 1.2 mg/dL   Alkaline Phosphatase 131 (H) 39 - 117 IU/L   AST 13 0 - 40 IU/L   ALT 17 0 - 32 IU/L   Ct Abdomen Pelvis W Contrast  Result Date: 12/05/2018 CLINICAL DATA:  Left lower quadrant mass found 2 weeks ago. Vaginal discharge for 6 weeks. EXAM: CT ABDOMEN AND PELVIS WITH CONTRAST TECHNIQUE: Multidetector CT imaging of the abdomen and pelvis was performed using the standard protocol following bolus administration of intravenous contrast. CONTRAST:   174mL OMNIPAQUE IOHEXOL 300 MG/ML  SOLN COMPARISON:  06/14/2014 FINDINGS: Lower chest: Old granulomatous disease.  Probable emphysema. Hepatobiliary: Cholecystectomy.  Otherwise unremarkable. Pancreas: Unremarkable Spleen: 0.9 by 0.5 cm central splenic hypodense lesion on image 17/2, previously measuring 1.5 cm in diameter on 02/09/2014, probably a small hemangioma or similar benign lesion. Adrenals/Urinary Tract: 1.4 by 1.8 cm left adrenal nodule with relative washout of 46% compatible with adrenal adenoma. The right adrenal gland appears normal. 9 mm in diameter hypodense lesion of the right kidney lower pole is likely a cyst but technically too small to characterize. A 0.7 by 0.5 cm hypodense lesion of the right kidney upper pole is similarly too small to characterize. No appreciable urinary tract calculi. No hydronephrosis or hydroureter. There is some gas in the urinary bladder, query recent catheterization. Stomach/Bowel: Considerable sigmoid and descending colon diverticulosis with scattered diverticula elsewhere in the colon. Equivocal sigmoid diverticulitis with some very faint stranding in the adjacent sigmoid mesentery for example on image 69/6. No abscess is identified. Normal appendix. Vascular/Lymphatic: Aortoiliac atherosclerotic vascular disease. Reproductive: Uterus absent. There is mild prominence of the left side of the vaginal cuff which is in close proximity to the sigmoid colon. No appreciable oral contrast medium or significant amount of gas in the vagina. Other: No supplemental non-categorized findings. Musculoskeletal: Pelvic floor laxity with low position of the anorectal junction. IMPRESSION: 1. Prominent sigmoid colon diverticulosis potentially with very low-grade sigmoid diverticulitis. No extraluminal gas or abscess. There is also diverticulosis of the descending colon and scattered diverticula elsewhere in the colon. 2. The sigmoid colon is in close proximity to a somewhat prominent  and indistinct left vaginal cuff. It is difficult to completely exclude the possibility of a small rectovaginal fistula, however there is no current contrast medium or significant amount of gas in the vagina to further suggest such connection. 3. There is some gas in the urinary bladder lumen. If the patient has had recent catheterization, that is the most likely cause. In the absence of recent catheterization, the possibility of an otherwise occult fistula to the urinary bladder might be considered. 4. Other imaging findings of potential clinical significance: Aortic Atherosclerosis (ICD10-I70.0) and Emphysema (ICD10-J43.9). Old granulomatous disease. Left adrenal adenoma. Probable small splenic hemangioma, smaller in appearance size compared to 2015. Pelvic floor laxity. Electronically Signed   By: Van Clines M.D.   On: 12/05/2018 16:57       Meds ordered this encounter  Medications  . ciprofloxacin (CIPRO) 500 MG tablet    Sig: Take 1 tablet (500 mg total) by mouth 2 (two) times daily.    Dispense:  20 tablet    Refill:  0  . metroNIDAZOLE (FLAGYL) 500 MG tablet    Sig: Take 1 tablet (500 mg total) by  mouth 2 (two) times daily.    Dispense:  20 tablet    Refill:  0    No orders of the defined types were placed in this encounter.   Assessment and Plan:     Diverticulitis large intestine w/o perforation or abscess w/o bleeding  LLQ pain  Follow up 2 weeks to see response to antibiotics      I discussed the assessment and treatment plan with the patient. The patient was provided an opportunity to ask questions and all were answered. The patient agreed with the plan and demonstrated an understanding of the instructions.   The patient was advised to call back or seek an in-person evaluation/go to the ED if the symptoms worsen or if the condition fails to improve as anticipated.  I provided 11 minutes of non-face-to-face time during this encounter.   Florian Buff, [place]  for [facility]

## 2018-12-10 ENCOUNTER — Other Ambulatory Visit: Payer: Medicare HMO

## 2018-12-12 ENCOUNTER — Other Ambulatory Visit: Payer: Self-pay | Admitting: *Deleted

## 2018-12-12 NOTE — Telephone Encounter (Signed)
Received call from patient.   Requested refill on Oxycodone/APAP.   Ok to refill??  Last office visit 10/09/2018.  Last refill 11/11/2018.

## 2018-12-12 NOTE — Progress Notes (Signed)
Please place orders in Epic as patient is being scheduled for a pre-op appointment! Thank you! 

## 2018-12-13 MED ORDER — OXYCODONE-ACETAMINOPHEN 7.5-325 MG PO TABS: 1.0000 | ORAL_TABLET | Freq: Four times a day (QID) | ORAL | 0 refills | Status: DC | PRN

## 2018-12-17 ENCOUNTER — Ambulatory Visit: Payer: Medicare HMO | Admitting: Obstetrics and Gynecology

## 2018-12-19 ENCOUNTER — Other Ambulatory Visit: Payer: Self-pay | Admitting: Family Medicine

## 2018-12-20 ENCOUNTER — Ambulatory Visit: Payer: Medicare HMO | Admitting: Obstetrics & Gynecology

## 2018-12-20 ENCOUNTER — Telehealth: Payer: Self-pay | Admitting: Obstetrics & Gynecology

## 2018-12-20 NOTE — Telephone Encounter (Signed)
Called Patient to pre screen before her appointment on Monday. Patient did not answer, I left message giving patient instruction on bring in a face mask and only she is allowed to come in to her appointment.

## 2018-12-21 ENCOUNTER — Other Ambulatory Visit: Payer: Self-pay | Admitting: Family Medicine

## 2018-12-23 ENCOUNTER — Ambulatory Visit: Payer: Medicare HMO | Admitting: Obstetrics & Gynecology

## 2018-12-24 ENCOUNTER — Ambulatory Visit (INDEPENDENT_AMBULATORY_CARE_PROVIDER_SITE_OTHER): Payer: Medicare HMO | Admitting: Obstetrics & Gynecology

## 2018-12-24 ENCOUNTER — Encounter: Payer: Self-pay | Admitting: Obstetrics & Gynecology

## 2018-12-24 ENCOUNTER — Other Ambulatory Visit: Payer: Self-pay

## 2018-12-24 VITALS — BP 144/79 | HR 104 | Ht 63.0 in | Wt 207.5 lb

## 2018-12-24 DIAGNOSIS — K5732 Diverticulitis of large intestine without perforation or abscess without bleeding: Secondary | ICD-10-CM | POA: Diagnosis not present

## 2018-12-24 MED ORDER — POLYETHYLENE GLYCOL 3350 17 GM/SCOOP PO POWD: ORAL | 11 refills | Status: DC

## 2018-12-24 NOTE — Progress Notes (Signed)
Chief Complaint  Patient presents with  . Follow-up    still having left lower quad pain; pain with BM's      68 y.o. [id number] No LMP recorded. Patient has had a hysterectomy. The current method of family planning is status post hysterectomy.  Outpatient Encounter Medications as of 12/24/2018  Medication Sig  . allopurinol (ZYLOPRIM) 300 MG tablet TAKE 1 TABLET BY MOUTH EVERY DAY  . amLODipine (NORVASC) 10 MG tablet TAKE 1 TABLET BY MOUTH EVERY DAY (Patient taking differently: Take 10 mg by mouth daily. )  . atorvastatin (LIPITOR) 20 MG tablet Take 1 tablet (20 mg total) by mouth daily.  . B-D UF III MINI PEN NEEDLES 31G X 5 MM MISC INJECT 1 EACH INTO THE SKIN DAILY. USE AS DIRECTED TO INJECT INSULIN DAILY.  [name] glucose blood (ONE TOUCH ULTRA TEST) test strip USE AS DIRECTED TO TEST THREE TIMES DAILY  . insulin degludec (TRESIBA FLEXTOUCH) 100 UNIT/ML SOPN FlexTouch Pen Inject 0.12 mLs (12 Units total) into the skin daily at 10 pm.  . Lancets MISC Use as directed.  [name] losartan (COZAAR) 100 MG tablet TAKE 1 TABLET BY MOUTH EVERY DAY (Patient taking differently: Take 100 mg by mouth daily. )  . meloxicam (MOBIC) 7.5 MG tablet TAKE 1 TABLET BY MOUTH EVERY DAY (Patient taking differently: Take 7.5 mg by mouth daily. )  . metFORMIN (GLUCOPHAGE-XR) 500 MG 24 hr tablet TAKE 2 TABLETS(1000 MG) BY MOUTH DAILY WITH BREAKFAST  . omeprazole (PRILOSEC) 20 MG capsule TAKE 1 CAPSULE BY MOUTH EVERY DAY IN THE MORNING (Patient taking differently: Take 20 mg by mouth daily. TAKE 1 CAPSULE BY MOUTH EVERY DAY IN THE MORNING)  . oxyCODONE-acetaminophen (PERCOCET) 7.5-325 MG tablet Take 1 tablet by mouth every 6 (six) hours as needed for severe pain.  . polyethylene glycol powder (GLYCOLAX/MIRALAX) 17 GM/SCOOP powder 1 scoop daily or as needed  . [DISCONTINUED] ciprofloxacin (CIPRO) 500 MG tablet Take 1 tablet (500 mg total) by mouth 2 (two) times daily. (Patient not taking: Reported on 10/21/2018)  .  [DISCONTINUED] ciprofloxacin (CIPRO) 500 MG tablet Take 1 tablet (500 mg total) by mouth 2 (two) times daily.  . [DISCONTINUED] fluconazole (DIFLUCAN) 150 MG tablet Take 1 tablet repeat in 3 days (Patient not taking: Reported on 10/21/2018)  . [DISCONTINUED] metoprolol succinate (TOPROL-XL) 25 MG 24 hr tablet TAKE 1/2 TABLET BY MOUTH EVERY DAY  . [DISCONTINUED] metroNIDAZOLE (FLAGYL) 500 MG tablet Take 1 tablet (500 mg total) by mouth 2 (two) times daily.  . [DISCONTINUED] nicotine (NICODERM CQ) 21 mg/24hr patch Place 1 patch (21 mg total) onto the skin daily. (Patient not taking: Reported on 10/21/2018)  . [DISCONTINUED] phenazopyridine (PYRIDIUM) 100 MG tablet Take 1 tablet (100 mg total) by mouth 3 (three) times daily as needed for pain. (Patient not taking: Reported on 10/21/2018)  . [DISCONTINUED] simvastatin (ZOCOR) 10 MG tablet TAKE 1 TABLET BY MOUTH EVERY DAY IN THE MORNING (Patient taking differently: Take 10 mg by mouth daily. )   No facility-administered encounter medications on file as of 12/24/2018.     Subjective Pt is s/p treatment with cipro and flagyl for LLQ which was CT proven diverticulitis, on exam almost feels like there is a inflammatory process at the left upper vaginal corner which is probably related to the colon diverticulitis In any event she "feels 100%" better   No fever Past Medical History:  Diagnosis Date  . Anemia   . Blood transfusion 2009  after GI bleed  . CAD (coronary artery disease)   . Diabetes mellitus   . Diverticulitis   . Diverticulosis   . GERD (gastroesophageal reflux disease)   . Gout   . History of GI diverticular bleed 08/2004  . Hypertension   . IBS (irritable bowel syndrome)   . Osteoarthritis     Past Surgical History:  Procedure Laterality Date  . CHOLECYSTECTOMY  2003  . COLONOSCOPY  2000   Dr. Fuller Plan: marked diverticulosis, difficult procedure due to adhesions, polyps benign  . COLONOSCOPY  2006   Dr. Henrene Pastor: severe  pandiverticulosis  . COLONOSCOPY N/A 02/03/2013   SLF: 4 COLORECTAL POLYPS REMOVED/Moderate diverticulosis throughout the entire examined colon/Small internal hemorrhoids  . JOINT REPLACEMENT Right 2009   Knee Replacement Right   . PTCA    . REPLACEMENT TOTAL KNEE     right   . TOTAL KNEE ARTHROPLASTY Left 03/12/2015   Procedure: LEFT TOTAL KNEE REPLACEMENT;  Surgeon: Carole Civil, MD;  Location: [facility];  Service: Orthopedics;  Laterality: Left;  [name] VAGINAL HYSTERECTOMY      OB History    Gravida  2   Para  2   Term  2   Preterm      AB      Living  2     SAB      TAB      Ectopic      Multiple      Live Births              Allergies  Allergen Reactions  . Ace Inhibitors Cough  . Bentyl [Dicyclomine Hcl] Other (See Comments)    Blurry vision  . Penicillins Itching and Swelling    [place] Did it involve swelling of the face/tongue/throat, SOB, or low BP? No Did it involve sudden or severe rash/hives, skin peeling, or any reaction on the inside of your mouth or nose? No Did you need to seek medical attention at a hospital or doctor's office? No When did it last happen?20 years If all above answers are "NO", may proceed with cephalosporin use.     Social History   Socioeconomic History  . Marital status: Married    Spouse name: Not on file  . Number of children: 2  . Years of education: Not on file  . Highest education level: Not on file  Occupational History  . Not on file  Social Needs  . Financial resource strain: Not on file  . Food insecurity:    Worry: Not on file    Inability: Not on file  . Transportation needs:    Medical: Not on file    Non-medical: Not on file  Tobacco Use  . Smoking status: Current Every Day Smoker    Packs/day: 1.00    Years: 35.00    Pack years: 35.00    Types: Cigarettes  . Smokeless tobacco: Never Used  Substance and Sexual Activity  . Alcohol use: No  . Drug use: No  . Sexual activity: Not  Currently    Birth control/protection: Surgical    Comment: hyst  Lifestyle  . Physical activity:    Days per week: Not on file    Minutes per session: Not on file  . Stress: Not on file  Relationships  . Social connections:    Talks on phone: Not on file    Gets together: Not on file    Attends religious service: Not on file    Active  member of club or organization: Not on file    Attends meetings of clubs or organizations: Not on file    Relationship status: Not on file  Other Topics Concern  . Not on file  Social History Narrative  . Not on file    Family History  Problem Relation Age of Onset  . Colon polyps Sister   . Cancer Mother        lung   . Cancer Father        stomach   . Stomach cancer Father        questionable  . Colon cancer Neg Hx     Medications:       Current Outpatient Medications:  .  allopurinol (ZYLOPRIM) 300 MG tablet, TAKE 1 TABLET BY MOUTH EVERY DAY, Disp: 90 tablet, Rfl: 1 .  amLODipine (NORVASC) 10 MG tablet, TAKE 1 TABLET BY MOUTH EVERY DAY (Patient taking differently: Take 10 mg by mouth daily. ), Disp: 90 tablet, Rfl: 1 .  atorvastatin (LIPITOR) 20 MG tablet, Take 1 tablet (20 mg total) by mouth daily., Disp: 90 tablet, Rfl: 3 .  B-D UF III MINI PEN NEEDLES 31G X 5 MM MISC, INJECT 1 EACH INTO THE SKIN DAILY. USE AS DIRECTED TO INJECT INSULIN DAILY., Disp: 100 each, Rfl: 3 .  glucose blood (ONE TOUCH ULTRA TEST) test strip, USE AS DIRECTED TO TEST THREE TIMES DAILY, Disp: 300 each, Rfl: 3 .  insulin degludec (TRESIBA FLEXTOUCH) 100 UNIT/ML SOPN FlexTouch Pen, Inject 0.12 mLs (12 Units total) into the skin daily at 10 pm., Disp: 2 pen, Rfl: 2 .  Lancets MISC, Use as directed., Disp: 100 each, Rfl: 11 .  losartan (COZAAR) 100 MG tablet, TAKE 1 TABLET BY MOUTH EVERY DAY (Patient taking differently: Take 100 mg by mouth daily. ), Disp: 90 tablet, Rfl: 1 .  meloxicam (MOBIC) 7.5 MG tablet, TAKE 1 TABLET BY MOUTH EVERY DAY (Patient taking  differently: Take 7.5 mg by mouth daily. ), Disp: 90 tablet, Rfl: 1 .  metFORMIN (GLUCOPHAGE-XR) 500 MG 24 hr tablet, TAKE 2 TABLETS(1000 MG) BY MOUTH DAILY WITH BREAKFAST, Disp: 180 tablet, Rfl: 2 .  omeprazole (PRILOSEC) 20 MG capsule, TAKE 1 CAPSULE BY MOUTH EVERY DAY IN THE MORNING (Patient taking differently: Take 20 mg by mouth daily. TAKE 1 CAPSULE BY MOUTH EVERY DAY IN THE MORNING), Disp: 90 capsule, Rfl: 0 .  oxyCODONE-acetaminophen (PERCOCET) 7.5-325 MG tablet, Take 1 tablet by mouth every 6 (six) hours as needed for severe pain., Disp: 80 tablet, Rfl: 0 .  polyethylene glycol powder (GLYCOLAX/MIRALAX) 17 GM/SCOOP powder, 1 scoop daily or as needed, Disp: 255 g, Rfl: 11  Objective Blood pressure (!) 144/79, pulse (!) 104, height 5\' 3"  (1.6 m), weight 207 lb 8 oz (94.1 kg).  General WDWN female NAD Vulva:  normal appearing vulva with no masses, tenderness or lesions Vagina:  normal mucosa, no discharge Cervix:  absent Uterus:  not examined Adnexa: ovaries:,  tnder left upper vaginal corner   Pertinent ROS No burning with urination, frequency or urgency No nausea, vomiting or diarrhea Nor fever chills or other constitutional symptoms   Labs or studies reviewed    Impression Diagnoses this Encounter::   ICD-10-CM   1. Diverticulitis large intestine w/o perforation or abscess w/o bleeding K57.32    avoid nuts/seed, begin miralax at bedtime, colonoscopy after gets her left knee done    Established relevant diagnosis(es):   Plan/Recommendations: Meds ordered this encounter  Medications  .  polyethylene glycol powder (GLYCOLAX/MIRALAX) 17 GM/SCOOP powder    Sig: 1 scoop daily or as needed    Dispense:  255 g    Refill:  11    Labs or Scans Ordered: No orders of the defined types were placed in this encounter.   Management:: >pt is much improved but still has some awareness of achy feeling not sharp like it was nagging >add miralax and needs to have colonoscopy  after she gets her knee done, pt is aware and agrees  Follow up Return if symptoms worsen or fail to improve.    All questions were answered.

## 2018-12-27 ENCOUNTER — Other Ambulatory Visit: Payer: Self-pay | Admitting: Family Medicine

## 2018-12-31 DIAGNOSIS — I10 Essential (primary) hypertension: Secondary | ICD-10-CM | POA: Diagnosis not present

## 2018-12-31 DIAGNOSIS — R69 Illness, unspecified: Secondary | ICD-10-CM | POA: Diagnosis not present

## 2018-12-31 DIAGNOSIS — Z794 Long term (current) use of insulin: Secondary | ICD-10-CM | POA: Diagnosis not present

## 2018-12-31 DIAGNOSIS — E785 Hyperlipidemia, unspecified: Secondary | ICD-10-CM | POA: Diagnosis not present

## 2018-12-31 DIAGNOSIS — K5900 Constipation, unspecified: Secondary | ICD-10-CM | POA: Diagnosis not present

## 2018-12-31 DIAGNOSIS — G8929 Other chronic pain: Secondary | ICD-10-CM | POA: Diagnosis not present

## 2018-12-31 DIAGNOSIS — E119 Type 2 diabetes mellitus without complications: Secondary | ICD-10-CM | POA: Diagnosis not present

## 2018-12-31 DIAGNOSIS — K219 Gastro-esophageal reflux disease without esophagitis: Secondary | ICD-10-CM | POA: Diagnosis not present

## 2018-12-31 DIAGNOSIS — I2510 Atherosclerotic heart disease of native coronary artery without angina pectoris: Secondary | ICD-10-CM | POA: Diagnosis not present

## 2019-01-01 NOTE — Patient Instructions (Addendum)
YOU ARE REQUIRED TO BE TESTED FOR COVID-19 PRIOR TO YOUR SURGERY . YOUR TEST MUST BE COMPLETED ON Monday June 1st, 2020. TESTING IS LOCATED AT [facility] FROM 9:00AM - 3:00PM. FAILURE TO COMPLETE TESTING MAY RESULT IN CANCELLATION OF YOUR SURGERY.                   [name]   Your procedure is scheduled on: 01-09-2019   Report to [facility] Main  Entrance    NO SMOKING FOR 24 HOURS BEFORE SURGERY    Report to SHORT STAY at 5:30AM      Call this number if you have problems the morning of surgery [phone]      Remember: Do not eat food or drink liquids :After Midnight. BRUSH YOUR TEETH MORNING OF SURGERY AND RINSE YOUR MOUTH OUT, NO CHEWING GUM CANDY OR MINTS.     Take these medicines the morning of surgery with A SIP OF WATER: ALLOPURINOL, AMLODIPINE, ATORVASTATIN, OMEPRAZOLE    DIABETES MANAGEMENT FOR SURGERY  THE NIGHT BEFORE SURGERY, ONLY TAKE HALF DOSE OF TRESIBA INSULIN DO NOT TAKE ANY ORAL DIABETES MEDICATION THE DAY OF SURGERY! PLEASE CHECK YOUR BLOOD SUGAR THE DAY OF SURGERY. REPORT TO YOUR NURSE ON ARRIVAL.  IF YOUR BLOOD SUGAR IS LESS THAN 70 THE MORNING SURGERY WHEN YOU CHECK IT AT HOME, YOU NEED TO CALL THE [phone] FOR FURTHER INSTRUCTIONS!                                 You may not have any metal on your body including hair pins and              piercings  Do not wear jewelry, make-up, lotions, powders or perfumes, deodorant             Do not wear nail polish.  Do not shave  48 hours prior to surgery.     Do not bring valuables to the hospital. [place].  Contacts, dentures or bridgework may not be worn into surgery.                Please read over the following fact sheets you were given: _____________________________________________________________________             [facility] - Preparing for Surgery Before surgery, you  can play an important role.  Because skin is not sterile, your skin needs to be as free of germs as possible.  You can reduce the number of germs on your skin by washing with CHG (chlorahexidine gluconate) soap before surgery.  CHG is an antiseptic cleaner which kills germs and bonds with the skin to continue killing germs even after washing. Please DO NOT use if you have an allergy to CHG or antibacterial soaps.  If your skin becomes reddened/irritated stop using the CHG and inform your nurse when you arrive at Short Stay. Do not shave (including legs and underarms) for at least 48 hours prior to the first CHG shower.  You may shave your face/neck. Please follow these instructions carefully:  1.  Shower with CHG Soap the night before surgery and the  morning of Surgery.  2.  If you choose to wash your hair, wash your hair first as usual with your  normal  shampoo.  3.  After you shampoo, rinse your hair and body thoroughly to remove the  shampoo.                           4.  Use CHG as you would any other liquid soap.  You can apply chg directly  to the skin and wash                       Gently with a scrungie or clean washcloth.  5.  Apply the CHG Soap to your body ONLY FROM THE NECK DOWN.   Do not use on face/ open                           Wound or open sores. Avoid contact with eyes, ears mouth and genitals (private parts).                       Wash face,  Genitals (private parts) with your normal soap.             6.  Wash thoroughly, paying special attention to the area where your surgery  will be performed.  7.  Thoroughly rinse your body with warm water from the neck down.  8.  DO NOT shower/wash with your normal soap after using and rinsing off  the CHG Soap.                9.  Pat yourself dry with a clean towel.            10.  Wear clean pajamas.            11.  Place clean sheets on your bed the night of your first shower and do not  sleep with pets. Day of Surgery : Do not apply  any lotions/deodorants the morning of surgery.  Please wear clean clothes to the hospital/surgery center.  FAILURE TO FOLLOW THESE INSTRUCTIONS MAY RESULT IN THE CANCELLATION OF YOUR SURGERY PATIENT SIGNATURE_________________________________  NURSE SIGNATURE__________________________________  ________________________________________________________________________   Adam Phenix  An incentive spirometer is a tool that can help keep your lungs clear and active. This tool measures how well you are filling your lungs with each breath. Taking long deep breaths may help reverse or decrease the chance of developing breathing (pulmonary) problems (especially infection) following:  A long period of time when you are unable to move or be active. BEFORE THE PROCEDURE   If the spirometer includes an indicator to show your best effort, your nurse or respiratory therapist will set it to a desired goal.  If possible, sit up straight or lean slightly forward. Try not to slouch.  Hold the incentive spirometer in an upright position. INSTRUCTIONS FOR USE  1. Sit on the edge of your bed if possible, or sit up as far as you can in bed or on a chair. 2. Hold the incentive spirometer in an upright position. 3. Breathe out normally. 4. Place the mouthpiece in your mouth and seal your lips tightly around it. 5. Breathe in slowly and as deeply as possible, raising the piston or the ball toward the top of the column. 6. Hold your breath for 3-5 seconds or for as long as possible. Allow the piston or ball to fall to the bottom of the column. 7. Remove the mouthpiece from your mouth and breathe out normally.  8. Rest for a few seconds and repeat Steps 1 through 7 at least 10 times every 1-2 hours when you are awake. Take your time and take a few normal breaths between deep breaths. 9. The spirometer may include an indicator to show your best effort. Use the indicator as a goal to work toward during each  repetition. 10. After each set of 10 deep breaths, practice coughing to be sure your lungs are clear. If you have an incision (the cut made at the time of surgery), support your incision when coughing by placing a pillow or rolled up towels firmly against it. Once you are able to get out of bed, walk around indoors and cough well. You may stop using the incentive spirometer when instructed by your caregiver.  RISKS AND COMPLICATIONS  Take your time so you do not get dizzy or light-headed.  If you are in pain, you may need to take or ask for pain medication before doing incentive spirometry. It is harder to take a deep breath if you are having pain. AFTER USE  Rest and breathe slowly and easily.  It can be helpful to keep track of a log of your progress. Your caregiver can provide you with a simple table to help with this. If you are using the spirometer at home, follow these instructions: Pillow IF:   You are having difficultly using the spirometer.  You have trouble using the spirometer as often as instructed.  Your pain medication is not giving enough relief while using the spirometer.  You develop fever of 100.5 F (38.1 C) or higher. SEEK IMMEDIATE MEDICAL CARE IF:   You cough up bloody sputum that had not been present before.  You develop fever of 102 F (38.9 C) or greater.  You develop worsening pain at or near the incision site. MAKE SURE YOU:   Understand these instructions.  Will watch your condition.  Will get help right away if you are not doing well or get worse. Document Released: 12/04/2006 Document Revised: 10/16/2011 Document Reviewed: 02/04/2007 Exit[employer] Patient Information 2014 [employer], [place].   ________________________________________________________________________  WHAT IS A BLOOD TRANSFUSION? Blood Transfusion Information  A transfusion is the replacement of blood or some of its parts. Blood is made up of multiple cells which provide  different functions.  Red blood cells carry oxygen and are used for blood loss replacement.  White blood cells fight against infection.  Platelets control bleeding.  Plasma helps clot blood.  Other blood products are available for specialized needs, such as hemophilia or other clotting disorders. BEFORE THE TRANSFUSION  Who gives blood for transfusions?   Healthy volunteers who are fully evaluated to make sure their blood is safe. This is blood bank blood. Transfusion therapy is the safest it has ever been in the practice of medicine. Before blood is taken from a donor, a complete history is taken to make sure that person has no history of diseases nor engages in risky social behavior (examples are intravenous drug use or sexual activity with multiple partners). The donor's travel history is screened to minimize risk of transmitting infections, such as malaria. The donated blood is tested for signs of infectious diseases, such as HIV and hepatitis. The blood is then tested to be sure it is compatible with you in order to minimize the chance of a transfusion reaction. If you or a relative donates blood, this is often done in anticipation of surgery and is not appropriate for emergency situations. It  takes many days to process the donated blood. RISKS AND COMPLICATIONS Although transfusion therapy is very safe and saves many lives, the main dangers of transfusion include:   Getting an infectious disease.  Developing a transfusion reaction. This is an allergic reaction to something in the blood you were given. Every precaution is taken to prevent this. The decision to have a blood transfusion has been considered carefully by your caregiver before blood is given. Blood is not given unless the benefits outweigh the risks. AFTER THE TRANSFUSION  Right after receiving a blood transfusion, you will usually feel much better and more energetic. This is especially true if your red blood cells have  gotten low (anemic). The transfusion raises the level of the red blood cells which carry oxygen, and this usually causes an energy increase.  The nurse administering the transfusion will monitor you carefully for complications. HOME CARE INSTRUCTIONS  No special instructions are needed after a transfusion. You may find your energy is better. Speak with your caregiver about any limitations on activity for underlying diseases you may have. SEEK MEDICAL CARE IF:   Your condition is not improving after your transfusion.  You develop redness or irritation at the intravenous (IV) site. SEEK IMMEDIATE MEDICAL CARE IF:  Any of the following symptoms occur over the next 12 hours:  Shaking chills.  You have a temperature by mouth above 102 F (38.9 C), not controlled by medicine.  Chest, back, or muscle pain.  People around you feel you are not acting correctly or are confused.  Shortness of breath or difficulty breathing.  Dizziness and fainting.  You get a rash or develop hives.  You have a decrease in urine output.  Your urine turns a dark color or changes to pink, red, or brown. Any of the following symptoms occur over the next 10 days:  You have a temperature by mouth above 102 F (38.9 C), not controlled by medicine.  Shortness of breath.  Weakness after normal activity.  The white part of the eye turns yellow (jaundice).  You have a decrease in the amount of urine or are urinating less often.  Your urine turns a dark color or changes to pink, red, or brown. Document Released: 07/21/2000 Document Revised: 10/16/2011 Document Reviewed: 03/09/2008 [facility] Patient Information 2014 [place], [place].  _______________________________________________________________________

## 2019-01-01 NOTE — Progress Notes (Addendum)
LOV DR.Dupuyer (pre-op pcp visit) 09-13-2018 Epic   EKG 09-13-2018 Epic   CXR 09-19-2018 Epic   US CAROTID 01-08-18 Epic    Patient reports ongoing history of elevated heart rate . She reports that HR can be up to 119-121 if active. HR was 109 today. Patient states that the walk from the patient parking lot up the hill to the main entrance was very difficult due to knee limitation. Patient did not exhibit any signs of dyspnea or sob. Actually in writer opinion ,patient ambulates very swifly despite condition of surgical knee.   Hx of PCTA , patient reports no blockages were found and no stents placed. She also reports she Has not been to cardiology in years as she has no cardiac complaints .

## 2019-01-02 ENCOUNTER — Other Ambulatory Visit: Payer: Self-pay

## 2019-01-02 ENCOUNTER — Encounter (HOSPITAL_COMMUNITY): Payer: Self-pay

## 2019-01-02 ENCOUNTER — Encounter (HOSPITAL_COMMUNITY)
Admission: RE | Admit: 2019-01-02 | Discharge: 2019-01-02 | Disposition: A | Discharge status: Home / Self Care | Payer: Medicare HMO | Source: Ambulatory Visit | Attending: Orthopedic Surgery | Admitting: Orthopedic Surgery

## 2019-01-02 DIAGNOSIS — K219 Gastro-esophageal reflux disease without esophagitis: Secondary | ICD-10-CM | POA: Diagnosis not present

## 2019-01-02 DIAGNOSIS — F17210 Nicotine dependence, cigarettes, uncomplicated: Secondary | ICD-10-CM | POA: Diagnosis not present

## 2019-01-02 DIAGNOSIS — Z79899 Other long term (current) drug therapy: Secondary | ICD-10-CM | POA: Insufficient documentation

## 2019-01-02 DIAGNOSIS — I10 Essential (primary) hypertension: Secondary | ICD-10-CM | POA: Insufficient documentation

## 2019-01-02 DIAGNOSIS — Z01812 Encounter for preprocedural laboratory examination: Secondary | ICD-10-CM | POA: Diagnosis present

## 2019-01-02 DIAGNOSIS — Z794 Long term (current) use of insulin: Secondary | ICD-10-CM | POA: Diagnosis not present

## 2019-01-02 DIAGNOSIS — X58XXXA Exposure to other specified factors, initial encounter: Secondary | ICD-10-CM | POA: Diagnosis not present

## 2019-01-02 DIAGNOSIS — E118 Type 2 diabetes mellitus with unspecified complications: Secondary | ICD-10-CM | POA: Diagnosis not present

## 2019-01-02 DIAGNOSIS — I2510 Atherosclerotic heart disease of native coronary artery without angina pectoris: Secondary | ICD-10-CM | POA: Diagnosis not present

## 2019-01-02 DIAGNOSIS — T84093A Other mechanical complication of internal left knee prosthesis, initial encounter: Secondary | ICD-10-CM | POA: Insufficient documentation

## 2019-01-02 DIAGNOSIS — R69 Illness, unspecified: Secondary | ICD-10-CM | POA: Diagnosis not present

## 2019-01-02 HISTORY — DX: Headache, unspecified: R51.9

## 2019-01-02 HISTORY — DX: Cardiac murmur, unspecified: R01.1

## 2019-01-02 HISTORY — DX: Tachycardia, unspecified: R00.0

## 2019-01-02 HISTORY — DX: Fear of other medical care: F40.232

## 2019-01-02 LAB — BASIC METABOLIC PANEL
Anion gap: 10 (ref 5–15)
BUN: 16 mg/dL (ref 8–23)
CO2: 22 mmol/L (ref 22–32)
Calcium: 9.4 mg/dL (ref 8.9–10.3)
Chloride: 107 mmol/L (ref 98–111)
Creatinine, Ser: 0.8 mg/dL (ref 0.44–1.00)
GFR calc Af Amer: >60 mL/min (ref >60)
GFR calc non Af Amer: >60 mL/min (ref >60)
Glucose, Bld: 96 mg/dL (ref 70–99)
Potassium: 4.3 mmol/L (ref 3.5–5.1)
Sodium: 139 mmol/L (ref 135–145)

## 2019-01-02 LAB — CBC
HCT: 35.5 % — ABNORMAL LOW (ref 36.0–46.0)
Hemoglobin: 12.5 g/dL (ref 12.0–15.0)
MCH: 31.5 pg (ref 26.0–34.0)
MCHC: 35.2 g/dL (ref 30.0–36.0)
MCV: 89.4 fL (ref 80.0–100.0)
Platelets: 299 10*3/uL (ref 150–400)
RBC: 3.97 MIL/uL (ref 3.87–5.11)
RDW: 13.4 % (ref 11.5–15.5)
WBC: 6.1 10*3/uL (ref 4.0–10.5)
nRBC: 0 % (ref 0.0–0.2)

## 2019-01-02 LAB — HEMOGLOBIN A1C
Hgb A1c MFr Bld: 5.9 % — ABNORMAL HIGH (ref 4.8–5.6)
Mean Plasma Glucose: 122.63 mg/dL

## 2019-01-02 LAB — SURGICAL PCR SCREEN
MRSA, PCR: NEGATIVE
Staphylococcus aureus: NEGATIVE

## 2019-01-02 LAB — GLUCOSE, CAPILLARY: Glucose-Capillary: 103 mg/dL — ABNORMAL HIGH (ref 70–99)

## 2019-01-03 ENCOUNTER — Encounter (HOSPITAL_COMMUNITY): Admission: RE | Admit: 2019-01-03 | Payer: Medicare HMO | Source: Ambulatory Visit

## 2019-01-06 ENCOUNTER — Other Ambulatory Visit: Payer: Self-pay

## 2019-01-06 ENCOUNTER — Other Ambulatory Visit: Payer: Self-pay | Admitting: Family Medicine

## 2019-01-06 ENCOUNTER — Other Ambulatory Visit (HOSPITAL_COMMUNITY)
Admission: RE | Admit: 2019-01-06 | Discharge: 2019-01-06 | Disposition: A | Discharge status: Home / Self Care | Payer: Medicare HMO | Source: Ambulatory Visit | Attending: Orthopedic Surgery | Admitting: Orthopedic Surgery

## 2019-01-06 DIAGNOSIS — Z1159 Encounter for screening for other viral diseases: Secondary | ICD-10-CM | POA: Diagnosis not present

## 2019-01-06 DIAGNOSIS — R69 Illness, unspecified: Secondary | ICD-10-CM | POA: Diagnosis not present

## 2019-01-06 NOTE — Progress Notes (Signed)
Anesthesia Chart Review   Case:  [id number] Date/Time:  01/09/19 0715   Procedure:  TOTAL KNEE REVISION (Left )   Anesthesia type:  Spinal   Pre-op diagnosis:  Failed left total knee arthroplasty   Location:  [facility] 08 / [facility]   Surgeon:  Rod Can, MD      DISCUSSION: 68 yo current every day smoker (35 pack years) with h/o HTN, DM II, GERD, CAD, failed left total knee arthroplasty 01/09/2019 with Dr. Rod Can.   Per OV note from PCP, Dr. Vic Blackbird, 09/13/2018, "Will obtain labs, if A1C is at goal or at least less than 8%, she can be cleared for surgery.  She is a smoker, placing her at higher risk of complications and with acute illness, so will obtain CXR before surgery."  A1C at PAT visit 01/02/2019 5.9, chest xray 09/19/18 normal.    Anticipate pt can proceed with planned procedure barring acute status change.  VS: BP 113/66   Pulse (!) 109   Temp 37.1 C (Oral)   Resp 16   Ht 5\' 3"  (1.6 m)   Wt 93.3 kg   SpO2 100%   BMI 36.42 kg/m   PROVIDERS: Kittrell, Modena Nunnery, MD is PCP    LABS: Labs reviewed: Acceptable for surgery. (all labs ordered are listed, but only abnormal results are displayed)  Labs Reviewed  GLUCOSE, CAPILLARY - Abnormal; Notable for the following components:      Result Value   Glucose-Capillary 103 (*)    All other components within normal limits  HEMOGLOBIN A1C - Abnormal; Notable for the following components:   Hgb A1c MFr Bld 5.9 (*)    All other components within normal limits  CBC - Abnormal; Notable for the following components:   HCT 35.5 (*)    All other components within normal limits  SURGICAL PCR SCREEN  BASIC METABOLIC PANEL     IMAGES: Chest Xray 09/19/2018 FINDINGS: Lungs are adequately inflated without airspace consolidation or effusion. Cardiomediastinal silhouette is normal. There are degenerative changes of the spine.  IMPRESSION: No active cardiopulmonary disease.  US Carotid Duplex  01/08/18 IMPRESSION: Minimal amount of bilateral intimal thickening and atherosclerotic plaque, right subjectively greater than left, not resulting in a hemodynamically significant stenosis within either internal carotid Artery.  EKG: 09/13/2018 Rate 115 bpm Sinus tachycardia  Right atrial enlargement  Nonspecific ST depression  CV:  Past Medical History:  Diagnosis Date  . Anemia   . Blood transfusion 2009   after GI bleed  . CAD (coronary artery disease)   . Diabetes mellitus    2  . Diverticulitis   . Diverticulosis   . Fear of local anesthetic    01-02-2019, patient states with last surgery in 2016 , she refused to have epidural before she was sedated d/t to extreme aversion to needles near her back    . GERD (gastroesophageal reflux disease)   . Gout   . Headache    occ if sugar is  too hi or low   . Heart murmur    "i was told this years and years and years ago "  . History of GI diverticular bleed 08/2004  . Hypertension   . IBS (irritable bowel syndrome)   . Osteoarthritis   . Tachycardia    "it stays like that for years between 119 and 121 and it doesnt bother me  none "     Past Surgical History:  Procedure Laterality Date  . CHOLECYSTECTOMY  2003  . COLONOSCOPY  2000   Dr. Fuller Plan: marked diverticulosis, difficult procedure due to adhesions, polyps benign  . COLONOSCOPY  2006   Dr. Henrene Pastor: severe pandiverticulosis  . COLONOSCOPY N/A 02/03/2013   SLF: 4 COLORECTAL POLYPS REMOVED/Moderate diverticulosis throughout the entire examined colon/Small internal hemorrhoids  . JOINT REPLACEMENT Right 2009   Knee Replacement Right   . PTCA     over 10 years ago , unsure of exact year , does recall that no stents were placed   . REPLACEMENT TOTAL KNEE     right   . TOTAL KNEE ARTHROPLASTY Left 03/12/2015   Procedure: LEFT TOTAL KNEE REPLACEMENT;  Surgeon: Carole Civil, MD;  Location: [facility];  Service: Orthopedics;  Laterality: Left;  [name] VAGINAL HYSTERECTOMY       MEDICATIONS: . insulin glargine (LANTUS) 100 UNIT/ML injection  . allopurinol (ZYLOPRIM) 300 MG tablet  . amLODipine (NORVASC) 10 MG tablet  . atorvastatin (LIPITOR) 20 MG tablet  . B-D UF III MINI PEN NEEDLES 31G X 5 MM MISC  . glucose blood (ONE TOUCH ULTRA TEST) test strip  . insulin degludec (TRESIBA FLEXTOUCH) 100 UNIT/ML SOPN FlexTouch Pen  . Lancets MISC  . losartan (COZAAR) 100 MG tablet  . meloxicam (MOBIC) 7.5 MG tablet  . metFORMIN (GLUCOPHAGE-XR) 500 MG 24 hr tablet  . omeprazole (PRILOSEC) 20 MG capsule  . oxyCODONE-acetaminophen (PERCOCET) 7.5-325 MG tablet  . polyethylene glycol powder (GLYCOLAX/MIRALAX) 17 GM/SCOOP powder   No current facility-administered medications for this encounter.     Maia Plan [facility] Pre-Surgical Testing [phone] 01/06/19 2:48 PM

## 2019-01-06 NOTE — Anesthesia Preprocedure Evaluation (Addendum)
Anesthesia Evaluation  Patient identified by MRN, date of birth, ID band Patient awake    Reviewed: Allergy & Precautions, NPO status , Patient's Chart, lab work & pertinent test results  History of Anesthesia Complications Negative for: history of anesthetic complications  Airway Mallampati: II  TM Distance: >3 FB Neck ROM: Full    Dental  (+) Missing, Dental Advisory Given   Pulmonary neg recent URI, Current Smoker,    breath sounds clear to auscultation       Cardiovascular hypertension, Pt. on medications (-) angina+ CAD   Rhythm:Regular     Neuro/Psych  Headaches, PSYCHIATRIC DISORDERS Depression  Neuromuscular disease negative psych ROS   GI/Hepatic GERD  Medicated and Controlled,  Endo/Other  diabetes, Type 2, Insulin DependentMorbid obesity  Renal/GU      Musculoskeletal  (+) Arthritis ,   Abdominal   Peds  Hematology  (+) anemia ,   Anesthesia Other Findings   Reproductive/Obstetrics                           Anesthesia Physical Anesthesia Plan  ASA: II  Anesthesia Plan: Regional and General   Post-op Pain Management:    Induction:   PONV Risk Score and Plan: 2 and Dexamethasone and Ondansetron  Airway Management Planned: LMA and Oral ETT  Additional Equipment: None  Intra-op Plan:   Post-operative Plan: Extubation in OR  Informed Consent: I have reviewed the patients History and Physical, chart, labs and discussed the procedure including the risks, benefits and alternatives for the proposed anesthesia with the patient or authorized representative who has indicated his/her understanding and acceptance.     Dental advisory given  Plan Discussed with: CRNA and Surgeon  Anesthesia Plan Comments: (See PAT note 01/02/2019, Konrad Felix, PA-C)      Anesthesia Quick Evaluation

## 2019-01-08 LAB — NOVEL CORONAVIRUS, NAA (HOSP ORDER, SEND-OUT TO REF LAB; TAT 18-24 HRS): SARS-CoV-2, NAA: NOT DETECTED

## 2019-01-08 NOTE — Progress Notes (Addendum)
SPOKE W/ patient via phone     SCREENING SYMPTOMS OF COVID 19:   COUGH-- no  RUNNY NOSE--- no  SORE THROAT--- no  NASAL CONGESTION----no  SNEEZING----no  SHORTNESS OF BREATH---no  DIFFICULTY BREATHING---no  TEMP >100.0 -----no   UNEXPLAINED BODY ACHES------no  CHILLS -------- no  HEADACHES ---------no  LOSS OF SMELL/ TASTE --------no    HAVE YOU OR ANY FAMILY MEMBER TRAVELLED PAST 14 DAYS OUT OF THE   COUNTY---no STATE----no COUNTRY----no  HAVE YOU OR ANY FAMILY MEMBER BEEN EXPOSED TO ANYONE WITH COVID 19?  NO

## 2019-01-09 ENCOUNTER — Inpatient Hospital Stay (HOSPITAL_COMMUNITY): Payer: Medicare HMO

## 2019-01-09 ENCOUNTER — Other Ambulatory Visit: Payer: Self-pay

## 2019-01-09 ENCOUNTER — Encounter (HOSPITAL_COMMUNITY): Payer: Self-pay

## 2019-01-09 ENCOUNTER — Inpatient Hospital Stay (HOSPITAL_COMMUNITY)
Admission: RE | Admit: 2019-01-09 | Discharge: 2019-01-11 | DRG: 468 | Disposition: A | Discharge status: Home / Self Care | Payer: Medicare HMO | Attending: Orthopedic Surgery | Admitting: Orthopedic Surgery

## 2019-01-09 ENCOUNTER — Inpatient Hospital Stay (HOSPITAL_COMMUNITY): Payer: Medicare HMO | Admitting: Certified Registered Nurse Anesthetist

## 2019-01-09 ENCOUNTER — Inpatient Hospital Stay (HOSPITAL_COMMUNITY): Payer: Medicare HMO | Admitting: Physician Assistant

## 2019-01-09 ENCOUNTER — Encounter (HOSPITAL_COMMUNITY)
Admission: RE | Disposition: A | Discharge status: Home / Self Care | Payer: Self-pay | Source: Home / Self Care | Attending: Orthopedic Surgery

## 2019-01-09 DIAGNOSIS — E1140 Type 2 diabetes mellitus with diabetic neuropathy, unspecified: Secondary | ICD-10-CM | POA: Diagnosis present

## 2019-01-09 DIAGNOSIS — F17210 Nicotine dependence, cigarettes, uncomplicated: Secondary | ICD-10-CM | POA: Diagnosis present

## 2019-01-09 DIAGNOSIS — M1990 Unspecified osteoarthritis, unspecified site: Secondary | ICD-10-CM | POA: Diagnosis not present

## 2019-01-09 DIAGNOSIS — I2510 Atherosclerotic heart disease of native coronary artery without angina pectoris: Secondary | ICD-10-CM | POA: Diagnosis present

## 2019-01-09 DIAGNOSIS — T84093A Other mechanical complication of internal left knee prosthesis, initial encounter: Secondary | ICD-10-CM

## 2019-01-09 DIAGNOSIS — I10 Essential (primary) hypertension: Secondary | ICD-10-CM | POA: Diagnosis not present

## 2019-01-09 DIAGNOSIS — Z880 Allergy status to penicillin: Secondary | ICD-10-CM | POA: Diagnosis not present

## 2019-01-09 DIAGNOSIS — Z6836 Body mass index (BMI) 36.0-36.9, adult: Secondary | ICD-10-CM | POA: Diagnosis not present

## 2019-01-09 DIAGNOSIS — Z888 Allergy status to other drugs, medicaments and biological substances status: Secondary | ICD-10-CM | POA: Diagnosis not present

## 2019-01-09 DIAGNOSIS — Z9181 History of falling: Secondary | ICD-10-CM

## 2019-01-09 DIAGNOSIS — Z96652 Presence of left artificial knee joint: Secondary | ICD-10-CM | POA: Diagnosis not present

## 2019-01-09 DIAGNOSIS — T84033A Mechanical loosening of internal left knee prosthetic joint, initial encounter: Secondary | ICD-10-CM | POA: Diagnosis not present

## 2019-01-09 DIAGNOSIS — Y792 Prosthetic and other implants, materials and accessory orthopedic devices associated with adverse incidents: Secondary | ICD-10-CM | POA: Diagnosis present

## 2019-01-09 DIAGNOSIS — K219 Gastro-esophageal reflux disease without esophagitis: Secondary | ICD-10-CM | POA: Diagnosis not present

## 2019-01-09 DIAGNOSIS — Z96651 Presence of right artificial knee joint: Secondary | ICD-10-CM | POA: Diagnosis present

## 2019-01-09 DIAGNOSIS — Y831 Surgical operation with implant of artificial internal device as the cause of abnormal reaction of the patient, or of later complication, without mention of misadventure at the time of the procedure: Secondary | ICD-10-CM | POA: Diagnosis present

## 2019-01-09 DIAGNOSIS — Z79899 Other long term (current) drug therapy: Secondary | ICD-10-CM

## 2019-01-09 DIAGNOSIS — G8918 Other acute postprocedural pain: Secondary | ICD-10-CM | POA: Diagnosis not present

## 2019-01-09 DIAGNOSIS — M109 Gout, unspecified: Secondary | ICD-10-CM | POA: Diagnosis present

## 2019-01-09 DIAGNOSIS — Z471 Aftercare following joint replacement surgery: Secondary | ICD-10-CM | POA: Diagnosis not present

## 2019-01-09 DIAGNOSIS — Z791 Long term (current) use of non-steroidal anti-inflammatories (NSAID): Secondary | ICD-10-CM

## 2019-01-09 DIAGNOSIS — R69 Illness, unspecified: Secondary | ICD-10-CM | POA: Diagnosis not present

## 2019-01-09 DIAGNOSIS — Z794 Long term (current) use of insulin: Secondary | ICD-10-CM

## 2019-01-09 DIAGNOSIS — E785 Hyperlipidemia, unspecified: Secondary | ICD-10-CM | POA: Diagnosis present

## 2019-01-09 HISTORY — PX: TOTAL KNEE REVISION: SHX996

## 2019-01-09 LAB — SYNOVIAL CELL COUNT + DIFF, W/ CRYSTALS
Crystals, Fluid: NONE SEEN
Lymphocytes-Synovial Fld: 8 % (ref 0–20)
Monocyte-Macrophage-Synovial Fluid: 20 % — ABNORMAL LOW (ref 50–90)
Neutrophil, Synovial: 72 % — ABNORMAL HIGH (ref 0–25)
WBC, Synovial: 2200 /mm3 — ABNORMAL HIGH (ref 0–200)

## 2019-01-09 LAB — GLUCOSE, CAPILLARY
Glucose-Capillary: 134 mg/dL — ABNORMAL HIGH (ref 70–99)
Glucose-Capillary: 193 mg/dL — ABNORMAL HIGH (ref 70–99)
Glucose-Capillary: 262 mg/dL — ABNORMAL HIGH (ref 70–99)
Glucose-Capillary: 215 mg/dL — ABNORMAL HIGH (ref 70–99)

## 2019-01-09 LAB — ABO/RH: ABO/RH(D): B POS

## 2019-01-09 LAB — TYPE AND SCREEN
ABO/RH(D): B POS
Antibody Screen: NEGATIVE

## 2019-01-09 SURGERY — TOTAL KNEE REVISION
Anesthesia: Spinal | Laterality: Left

## 2019-01-09 SURGERY — TOTAL KNEE REVISION
Anesthesia: Regional | Site: Knee | Laterality: Left

## 2019-01-09 MED ORDER — ALLOPURINOL 300 MG PO TABS
300.0000 mg | ORAL_TABLET | Freq: Every day | ORAL | Status: DC
  Administered 2019-01-10 – 2019-01-11 (×2): 300 mg via ORAL
  Filled 2019-01-09 (×2): qty 1

## 2019-01-09 MED ORDER — SUGAMMADEX SODIUM 200 MG/2ML IV SOLN
INTRAVENOUS | Status: DC | PRN
  Administered 2019-01-09: 200 mg via INTRAVENOUS

## 2019-01-09 MED ORDER — ACETAMINOPHEN 10 MG/ML IV SOLN
INTRAVENOUS | Status: AC
  Filled 2019-01-09: qty 100

## 2019-01-09 MED ORDER — METHOCARBAMOL 500 MG PO TABS
500.0000 mg | ORAL_TABLET | Freq: Four times a day (QID) | ORAL | Status: DC | PRN
  Administered 2019-01-10 – 2019-01-11 (×3): 500 mg via ORAL
  Filled 2019-01-09 (×3): qty 1

## 2019-01-09 MED ORDER — MAGNESIUM CITRATE PO SOLN: 1.0000 | Freq: Once | ORAL | Status: DC | PRN

## 2019-01-09 MED ORDER — POVIDONE-IODINE 10 % EX SWAB
2.0000 "application " | Freq: Once | CUTANEOUS | Status: AC
  Administered 2019-01-09: 2 via TOPICAL

## 2019-01-09 MED ORDER — SUGAMMADEX SODIUM 200 MG/2ML IV SOLN
INTRAVENOUS | Status: AC
  Filled 2019-01-09: qty 2

## 2019-01-09 MED ORDER — ISOPROPYL ALCOHOL 70 % SOLN
Status: DC | PRN
  Administered 2019-01-09: 1 via TOPICAL

## 2019-01-09 MED ORDER — INSULIN DEGLUDEC 100 UNIT/ML ~~LOC~~ SOPN: 12.0000 [IU] | PEN_INJECTOR | Freq: Every day | SUBCUTANEOUS | Status: DC

## 2019-01-09 MED ORDER — SODIUM CHLORIDE (PF) 0.9 % IJ SOLN
INTRAMUSCULAR | Status: DC | PRN
  Administered 2019-01-09: 30 mL via INTRAVENOUS

## 2019-01-09 MED ORDER — ACETAMINOPHEN 325 MG PO TABS
325.0000 mg | ORAL_TABLET | Freq: Four times a day (QID) | ORAL | Status: DC | PRN
  Filled 2019-01-09: qty 2

## 2019-01-09 MED ORDER — METOCLOPRAMIDE HCL 5 MG/ML IJ SOLN: 5.0000 mg | Freq: Three times a day (TID) | INTRAMUSCULAR | Status: DC | PRN

## 2019-01-09 MED ORDER — SODIUM CHLORIDE (PF) 0.9 % IJ SOLN
INTRAMUSCULAR | Status: AC
  Filled 2019-01-09: qty 50

## 2019-01-09 MED ORDER — METHOCARBAMOL 500 MG IVPB - SIMPLE MED
500.0000 mg | Freq: Four times a day (QID) | INTRAVENOUS | Status: DC | PRN
  Filled 2019-01-09: qty 50

## 2019-01-09 MED ORDER — LABETALOL HCL 5 MG/ML IV SOLN
INTRAVENOUS | Status: AC
  Filled 2019-01-09: qty 4

## 2019-01-09 MED ORDER — ROPIVACAINE HCL 7.5 MG/ML IJ SOLN
INTRAMUSCULAR | Status: DC | PRN
  Administered 2019-01-09: 20 mL via PERINEURAL

## 2019-01-09 MED ORDER — LOSARTAN POTASSIUM 50 MG PO TABS
100.0000 mg | ORAL_TABLET | Freq: Every day | ORAL | Status: DC
  Administered 2019-01-09 – 2019-01-11 (×3): 100 mg via ORAL
  Filled 2019-01-09 (×3): qty 2

## 2019-01-09 MED ORDER — ACETAMINOPHEN 160 MG/5ML PO SOLN: 1000.0000 mg | Freq: Once | ORAL | Status: DC | PRN

## 2019-01-09 MED ORDER — LABETALOL HCL 5 MG/ML IV SOLN
INTRAVENOUS | Status: DC | PRN
  Administered 2019-01-09: 5 mg via INTRAVENOUS

## 2019-01-09 MED ORDER — MENTHOL 3 MG MT LOZG: 1.0000 | LOZENGE | OROMUCOSAL | Status: DC | PRN

## 2019-01-09 MED ORDER — PROPOFOL 10 MG/ML IV BOLUS
INTRAVENOUS | Status: AC
  Filled 2019-01-09: qty 40

## 2019-01-09 MED ORDER — SODIUM CHLORIDE 0.9 % IV SOLN
INTRAVENOUS | Status: DC
  Administered 2019-01-09: 08:00:00 via INTRAVENOUS

## 2019-01-09 MED ORDER — POLYETHYLENE GLYCOL 3350 17 G PO PACK: 17.0000 g | PACK | Freq: Every day | ORAL | Status: DC | PRN

## 2019-01-09 MED ORDER — DEXAMETHASONE SODIUM PHOSPHATE 10 MG/ML IJ SOLN
10.0000 mg | Freq: Once | INTRAMUSCULAR | Status: AC
  Administered 2019-01-10: 10:00:00 10 mg via INTRAVENOUS
  Filled 2019-01-09: qty 1

## 2019-01-09 MED ORDER — VANCOMYCIN HCL 1000 MG IV SOLR
1000.0000 mg | Freq: Two times a day (BID) | INTRAVENOUS | Status: AC
  Administered 2019-01-09: 1000 mg via INTRAVENOUS
  Filled 2019-01-09: qty 1000

## 2019-01-09 MED ORDER — ONDANSETRON HCL 4 MG/2ML IJ SOLN: 4.0000 mg | Freq: Four times a day (QID) | INTRAMUSCULAR | Status: DC | PRN

## 2019-01-09 MED ORDER — LIDOCAINE 2% (20 MG/ML) 5 ML SYRINGE
INTRAMUSCULAR | Status: AC
  Filled 2019-01-09: qty 5

## 2019-01-09 MED ORDER — ACETAMINOPHEN 500 MG PO TABS: 1000.0000 mg | ORAL_TABLET | Freq: Once | ORAL | Status: DC | PRN

## 2019-01-09 MED ORDER — FENTANYL CITRATE (PF) 100 MCG/2ML IJ SOLN: 25.0000 ug | INTRAMUSCULAR | Status: DC | PRN

## 2019-01-09 MED ORDER — FENTANYL CITRATE (PF) 100 MCG/2ML IJ SOLN
INTRAMUSCULAR | Status: AC
  Filled 2019-01-09: qty 2

## 2019-01-09 MED ORDER — KETOROLAC TROMETHAMINE 30 MG/ML IJ SOLN
INTRAMUSCULAR | Status: AC
  Filled 2019-01-09: qty 1

## 2019-01-09 MED ORDER — INSULIN GLARGINE 100 UNIT/ML ~~LOC~~ SOLN
12.0000 [IU] | Freq: Every day | SUBCUTANEOUS | Status: DC
  Administered 2019-01-09 – 2019-01-10 (×2): 12 [IU] via SUBCUTANEOUS
  Filled 2019-01-09 (×2): qty 0.12

## 2019-01-09 MED ORDER — STERILE WATER FOR IRRIGATION IR SOLN
Status: DC | PRN
  Administered 2019-01-09 (×2): 1000 mL

## 2019-01-09 MED ORDER — INSULIN ASPART 100 UNIT/ML ~~LOC~~ SOLN
0.0000 [IU] | Freq: Three times a day (TID) | SUBCUTANEOUS | Status: DC
  Administered 2019-01-09: 19:00:00 5 [IU] via SUBCUTANEOUS
  Administered 2019-01-10: 3 [IU] via SUBCUTANEOUS

## 2019-01-09 MED ORDER — ROCURONIUM BROMIDE 10 MG/ML (PF) SYRINGE
PREFILLED_SYRINGE | INTRAVENOUS | Status: AC
  Filled 2019-01-09: qty 10

## 2019-01-09 MED ORDER — BUPIVACAINE-EPINEPHRINE 0.25% -1:200000 IJ SOLN
INTRAMUSCULAR | Status: DC | PRN
  Administered 2019-01-09: 30 mL

## 2019-01-09 MED ORDER — DEXAMETHASONE SODIUM PHOSPHATE 10 MG/ML IJ SOLN
INTRAMUSCULAR | Status: AC
  Filled 2019-01-09: qty 1

## 2019-01-09 MED ORDER — LIDOCAINE 2% (20 MG/ML) 5 ML SYRINGE
INTRAMUSCULAR | Status: DC | PRN
  Administered 2019-01-09: 80 mg via INTRAVENOUS

## 2019-01-09 MED ORDER — BUPIVACAINE-EPINEPHRINE (PF) 0.25% -1:200000 IJ SOLN
INTRAMUSCULAR | Status: AC
  Filled 2019-01-09: qty 30

## 2019-01-09 MED ORDER — HYDROMORPHONE HCL 1 MG/ML IJ SOLN
INTRAMUSCULAR | Status: AC
  Administered 2019-01-09: 0.25 mg via INTRAVENOUS
  Filled 2019-01-09: qty 1

## 2019-01-09 MED ORDER — TRANEXAMIC ACID-NACL 1000-0.7 MG/100ML-% IV SOLN
INTRAVENOUS | Status: AC
  Filled 2019-01-09: qty 100

## 2019-01-09 MED ORDER — ACETAMINOPHEN 10 MG/ML IV SOLN: 1000.0000 mg | Freq: Once | INTRAVENOUS | Status: DC | PRN

## 2019-01-09 MED ORDER — ONDANSETRON HCL 4 MG/2ML IJ SOLN
INTRAMUSCULAR | Status: AC
  Filled 2019-01-09: qty 2

## 2019-01-09 MED ORDER — METOCLOPRAMIDE HCL 5 MG PO TABS: 5.0000 mg | ORAL_TABLET | Freq: Three times a day (TID) | ORAL | Status: DC | PRN

## 2019-01-09 MED ORDER — SENNA 8.6 MG PO TABS
1.0000 | ORAL_TABLET | Freq: Two times a day (BID) | ORAL | Status: DC
  Administered 2019-01-10 – 2019-01-11 (×2): 8.6 mg via ORAL
  Filled 2019-01-09 (×4): qty 1

## 2019-01-09 MED ORDER — SODIUM CHLORIDE 0.9 % IV SOLN
INTRAVENOUS | Status: DC
  Administered 2019-01-09 (×2): via INTRAVENOUS

## 2019-01-09 MED ORDER — PHENYLEPHRINE HCL-NACL 10-0.9 MG/250ML-% IV SOLN
INTRAVENOUS | Status: AC
  Filled 2019-01-09: qty 250

## 2019-01-09 MED ORDER — AMLODIPINE BESYLATE 10 MG PO TABS
10.0000 mg | ORAL_TABLET | Freq: Every day | ORAL | Status: DC
  Administered 2019-01-10 – 2019-01-11 (×2): 10 mg via ORAL
  Filled 2019-01-09 (×2): qty 1

## 2019-01-09 MED ORDER — VANCOMYCIN HCL IN DEXTROSE 1-5 GM/200ML-% IV SOLN
INTRAVENOUS | Status: AC
  Filled 2019-01-09: qty 200

## 2019-01-09 MED ORDER — PHENOL 1.4 % MT LIQD: 1.0000 | OROMUCOSAL | Status: DC | PRN

## 2019-01-09 MED ORDER — HYDROMORPHONE HCL 1 MG/ML IJ SOLN
0.2500 mg | INTRAMUSCULAR | Status: DC | PRN
  Administered 2019-01-09: 0.5 mg via INTRAVENOUS
  Administered 2019-01-09 (×3): 0.25 mg via INTRAVENOUS

## 2019-01-09 MED ORDER — ATORVASTATIN CALCIUM 20 MG PO TABS
20.0000 mg | ORAL_TABLET | Freq: Every day | ORAL | Status: DC
  Administered 2019-01-10 – 2019-01-11 (×2): 20 mg via ORAL
  Filled 2019-01-09 (×2): qty 1

## 2019-01-09 MED ORDER — OXYCODONE HCL 5 MG PO TABS: 5.0000 mg | ORAL_TABLET | Freq: Once | ORAL | Status: DC | PRN

## 2019-01-09 MED ORDER — METFORMIN HCL ER 500 MG PO TB24
500.0000 mg | ORAL_TABLET | Freq: Every day | ORAL | Status: DC
  Administered 2019-01-10 – 2019-01-11 (×2): 500 mg via ORAL
  Filled 2019-01-09 (×2): qty 1

## 2019-01-09 MED ORDER — ROCURONIUM BROMIDE 10 MG/ML (PF) SYRINGE
PREFILLED_SYRINGE | INTRAVENOUS | Status: DC | PRN
  Administered 2019-01-09: 70 mg via INTRAVENOUS

## 2019-01-09 MED ORDER — SODIUM CHLORIDE 0.9 % IR SOLN
Status: DC | PRN
  Administered 2019-01-09: 3000 mL
  Administered 2019-01-09: 1000 mL

## 2019-01-09 MED ORDER — VANCOMYCIN HCL IN DEXTROSE 1-5 GM/200ML-% IV SOLN
1000.0000 mg | INTRAVENOUS | Status: AC
  Administered 2019-01-09: 1000 mg via INTRAVENOUS

## 2019-01-09 MED ORDER — LACTATED RINGERS IV SOLN
INTRAVENOUS | Status: DC | PRN
  Administered 2019-01-09: 11:00:00 via INTRAVENOUS

## 2019-01-09 MED ORDER — DEXAMETHASONE SODIUM PHOSPHATE 4 MG/ML IJ SOLN
INTRAMUSCULAR | Status: DC | PRN
  Administered 2019-01-09: 5 mg via INTRAVENOUS

## 2019-01-09 MED ORDER — ISOPROPYL ALCOHOL 70 % SOLN
Status: AC
  Filled 2019-01-09: qty 480

## 2019-01-09 MED ORDER — METHOCARBAMOL 500 MG IVPB - SIMPLE MED
INTRAVENOUS | Status: AC
  Filled 2019-01-09: qty 50

## 2019-01-09 MED ORDER — OXYCODONE HCL 5 MG PO TABS
5.0000 mg | ORAL_TABLET | ORAL | Status: DC | PRN
  Administered 2019-01-09 – 2019-01-10 (×3): 5 mg via ORAL
  Administered 2019-01-10: 7.5 mg via ORAL
  Administered 2019-01-10: 08:00:00 5 mg via ORAL
  Administered 2019-01-11 (×2): 10 mg via ORAL
  Filled 2019-01-09: qty 1
  Filled 2019-01-09: qty 2
  Filled 2019-01-09: qty 1
  Filled 2019-01-09 (×2): qty 2
  Filled 2019-01-09 (×4): qty 1

## 2019-01-09 MED ORDER — FENTANYL CITRATE (PF) 100 MCG/2ML IJ SOLN
INTRAMUSCULAR | Status: DC | PRN
  Administered 2019-01-09: 50 ug via INTRAVENOUS
  Administered 2019-01-09: 100 ug via INTRAVENOUS
  Administered 2019-01-09 (×5): 50 ug via INTRAVENOUS

## 2019-01-09 MED ORDER — MIDAZOLAM HCL 5 MG/5ML IJ SOLN
INTRAMUSCULAR | Status: DC | PRN
  Administered 2019-01-09: 2 mg via INTRAVENOUS

## 2019-01-09 MED ORDER — POVIDONE-IODINE 10 % EX SWAB: 2.0000 "application " | Freq: Once | CUTANEOUS | Status: DC

## 2019-01-09 MED ORDER — CHLORHEXIDINE GLUCONATE 4 % EX LIQD: 60.0000 mL | Freq: Once | CUTANEOUS | Status: DC

## 2019-01-09 MED ORDER — OXYCODONE HCL 5 MG PO TABS
10.0000 mg | ORAL_TABLET | ORAL | Status: DC | PRN
  Administered 2019-01-10 – 2019-01-11 (×2): 10 mg via ORAL
  Filled 2019-01-09: qty 3

## 2019-01-09 MED ORDER — MIDAZOLAM HCL 2 MG/2ML IJ SOLN
INTRAMUSCULAR | Status: AC
  Filled 2019-01-09: qty 2

## 2019-01-09 MED ORDER — HYDROMORPHONE HCL 1 MG/ML IJ SOLN
0.5000 mg | INTRAMUSCULAR | Status: DC | PRN
  Administered 2019-01-09: 1 mg via INTRAVENOUS
  Filled 2019-01-09 (×2): qty 1

## 2019-01-09 MED ORDER — TRANEXAMIC ACID-NACL 1000-0.7 MG/100ML-% IV SOLN
1000.0000 mg | INTRAVENOUS | Status: AC
  Administered 2019-01-09: 1000 mg via INTRAVENOUS

## 2019-01-09 MED ORDER — SODIUM CHLORIDE 0.9 % IR SOLN
Status: DC | PRN
  Administered 2019-01-09: 1000 mL

## 2019-01-09 MED ORDER — KETOROLAC TROMETHAMINE 15 MG/ML IJ SOLN
15.0000 mg | Freq: Four times a day (QID) | INTRAMUSCULAR | Status: AC
  Administered 2019-01-09 – 2019-01-10 (×4): 15 mg via INTRAVENOUS
  Filled 2019-01-09 (×4): qty 1

## 2019-01-09 MED ORDER — ALUM & MAG HYDROXIDE-SIMETH 200-200-20 MG/5ML PO SUSP: 30.0000 mL | ORAL | Status: DC | PRN

## 2019-01-09 MED ORDER — PANTOPRAZOLE SODIUM 40 MG PO TBEC
40.0000 mg | DELAYED_RELEASE_TABLET | Freq: Every day | ORAL | Status: DC
  Administered 2019-01-10 – 2019-01-11 (×2): 40 mg via ORAL
  Filled 2019-01-09 (×2): qty 1

## 2019-01-09 MED ORDER — DIPHENHYDRAMINE HCL 12.5 MG/5ML PO ELIX: 12.5000 mg | ORAL_SOLUTION | ORAL | Status: DC | PRN

## 2019-01-09 MED ORDER — KETOROLAC TROMETHAMINE 30 MG/ML IJ SOLN
INTRAMUSCULAR | Status: DC | PRN
  Administered 2019-01-09: 30 mg via INTRAVENOUS

## 2019-01-09 MED ORDER — SORBITOL 70 % SOLN
30.0000 mL | Freq: Every day | Status: DC | PRN
  Filled 2019-01-09: qty 30

## 2019-01-09 MED ORDER — PROPOFOL 10 MG/ML IV BOLUS
INTRAVENOUS | Status: DC | PRN
  Administered 2019-01-09: 140 mg via INTRAVENOUS

## 2019-01-09 MED ORDER — ONDANSETRON HCL 4 MG/2ML IJ SOLN
INTRAMUSCULAR | Status: DC | PRN
  Administered 2019-01-09: 4 mg via INTRAVENOUS

## 2019-01-09 MED ORDER — DOCUSATE SODIUM 100 MG PO CAPS
100.0000 mg | ORAL_CAPSULE | Freq: Two times a day (BID) | ORAL | Status: DC
  Administered 2019-01-10 – 2019-01-11 (×3): 100 mg via ORAL
  Filled 2019-01-09 (×4): qty 1

## 2019-01-09 MED ORDER — VANCOMYCIN HCL IN DEXTROSE 1-5 GM/200ML-% IV SOLN
1000.0000 mg | Freq: Two times a day (BID) | INTRAVENOUS | Status: DC
  Filled 2019-01-09: qty 200

## 2019-01-09 MED ORDER — ACETAMINOPHEN 10 MG/ML IV SOLN
1000.0000 mg | INTRAVENOUS | Status: AC
  Administered 2019-01-09: 1000 mg via INTRAVENOUS

## 2019-01-09 MED ORDER — HYDROMORPHONE HCL 1 MG/ML IJ SOLN
INTRAMUSCULAR | Status: AC
  Administered 2019-01-09: 12:00:00 0.5 mg via INTRAVENOUS
  Filled 2019-01-09: qty 1

## 2019-01-09 MED ORDER — ONDANSETRON HCL 4 MG PO TABS: 4.0000 mg | ORAL_TABLET | Freq: Four times a day (QID) | ORAL | Status: DC | PRN

## 2019-01-09 MED ORDER — OXYCODONE HCL 5 MG/5ML PO SOLN: 5.0000 mg | Freq: Once | ORAL | Status: DC | PRN

## 2019-01-09 MED ORDER — APIXABAN 2.5 MG PO TABS
2.5000 mg | ORAL_TABLET | Freq: Two times a day (BID) | ORAL | Status: DC
  Administered 2019-01-10 – 2019-01-11 (×3): 2.5 mg via ORAL
  Filled 2019-01-09 (×3): qty 1

## 2019-01-09 SURGICAL SUPPLY — 95 items
ADAPTER OFFSET W/SCREWS 5 (Joint) ×1 IMPLANT
ADH SKN CLS APL DERMABOND .7 (GAUZE/BANDAGES/DRESSINGS) ×2
ADPR FEM 5XOFST KN REV SYS (Joint) ×1 IMPLANT
AGMNT FEM LL/RM W/BT KNEE (Miscellaneous) ×2 IMPLANT
AGMNT FEM W/BLT 5/60 KNEE (Miscellaneous) ×2 IMPLANT
APL PRP STRL LF DISP 70% ISPRP (MISCELLANEOUS) ×2
AUG BMT 360 TIB SM CRUC WING (Joint) ×2 IMPLANT
AUG FEM 60X5 REV KN LM/RL (Miscellaneous) ×1 IMPLANT
AUG FEM 60X5 REV KN RM/LL (Miscellaneous) ×1 IMPLANT
AUG TIB SM REV CRCTE WNG KN (Joint) ×1 IMPLANT
AUGMENT BMT 360 TIB SM CRC WNG (Joint) IMPLANT
AUGMENT FEM LL/RM W/BT KNEE (Miscellaneous) IMPLANT
AUGMENT FEM W/BLT 5/60 KNEE (Miscellaneous) IMPLANT
BAG DECANTER FOR FLEXI CONT (MISCELLANEOUS) ×2 IMPLANT
BAG SPEC THK2 15X12 ZIP CLS (MISCELLANEOUS) ×1
BAG ZIPLOCK 12X15 (MISCELLANEOUS) ×2 IMPLANT
BANDAGE ACE 6X5 VEL STRL LF (GAUZE/BANDAGES/DRESSINGS) ×2 IMPLANT
BEARING PS TBL 16 63/67 KNEE (Miscellaneous) ×1 IMPLANT
BLADE SAW RECIPROCATING 77.5 (BLADE) ×2 IMPLANT
BLADE SAW SGTL 81X20 HD (BLADE) ×2 IMPLANT
BLADE SURG SZ10 CARB STEEL (BLADE) ×2 IMPLANT
BNDG CMPR MED 15X6 ELC VLCR LF (GAUZE/BANDAGES/DRESSINGS) ×1
BNDG ELASTIC 6X15 VLCR STRL LF (GAUZE/BANDAGES/DRESSINGS) ×1 IMPLANT
BRNG TIB 63/67X16 POST (Miscellaneous) ×1 IMPLANT
CEMENT BONE REFOBACIN R1X40 US (Cement) ×4 IMPLANT
CHLORAPREP W/TINT 26 (MISCELLANEOUS) ×4 IMPLANT
COMP FEM LFT W/SCRW 62.5 KNEE (Miscellaneous) ×2 IMPLANT
COMPONENT FEM LFT W/SCRW 62.5 (Miscellaneous) IMPLANT
CONT SPEC 4OZ CLIKSEAL STRL BL (MISCELLANEOUS) ×1 IMPLANT
COVER SURGICAL LIGHT HANDLE (MISCELLANEOUS) ×2 IMPLANT
COVER WAND RF STERILE (DRAPES) IMPLANT
CUFF TOURN SGL QUICK 34 (TOURNIQUET CUFF) ×2
CUFF TRNQT CYL 34X4.125X (TOURNIQUET CUFF) ×1 IMPLANT
DECANTER SPIKE VIAL GLASS SM (MISCELLANEOUS) ×2 IMPLANT
DERMABOND ADVANCED (GAUZE/BANDAGES/DRESSINGS) ×2
DERMABOND ADVANCED .7 DNX12 (GAUZE/BANDAGES/DRESSINGS) ×2 IMPLANT
DRAPE SHEET LG 3/4 BI-LAMINATE (DRAPES) ×6 IMPLANT
DRAPE U-SHAPE 47X51 STRL (DRAPES) ×2 IMPLANT
DRESSING AQUACEL AG SP 3.5X10 (GAUZE/BANDAGES/DRESSINGS) IMPLANT
DRSG AQUACEL AG ADV 3.5X10 (GAUZE/BANDAGES/DRESSINGS) ×2 IMPLANT
DRSG AQUACEL AG SP 3.5X10 (GAUZE/BANDAGES/DRESSINGS) ×2
DRSG TEGADERM 4X4.75 (GAUZE/BANDAGES/DRESSINGS) ×1 IMPLANT
ELECT BLADE TIP CTD 4 INCH (ELECTRODE) ×2 IMPLANT
ELECT PENCIL ROCKER SW 15FT (MISCELLANEOUS) ×2 IMPLANT
ELECT REM PT RETURN 15FT ADLT (MISCELLANEOUS) ×2 IMPLANT
EVACUATOR 1/8 PVC DRAIN (DRAIN) ×1 IMPLANT
GLOVE BIO SURGEON STRL SZ8.5 (GLOVE) ×4 IMPLANT
GLOVE BIOGEL PI IND STRL 7.0 (GLOVE) ×1 IMPLANT
GLOVE BIOGEL PI IND STRL 7.5 (GLOVE) ×1 IMPLANT
GLOVE BIOGEL PI IND STRL 8.5 (GLOVE) ×1 IMPLANT
GLOVE BIOGEL PI INDICATOR 7.0 (GLOVE) ×1
GLOVE BIOGEL PI INDICATOR 7.5 (GLOVE) ×1
GLOVE BIOGEL PI INDICATOR 8.5 (GLOVE) ×1
GOWN SPEC L3 XXLG W/TWL (GOWN DISPOSABLE) ×2 IMPLANT
GOWN STRL REUS W/TWL XL LVL3 (GOWN DISPOSABLE) ×2 IMPLANT
HANDPIECE INTERPULSE COAX TIP (DISPOSABLE) ×2
HOLDER FOLEY CATH W/STRAP (MISCELLANEOUS) ×1 IMPLANT
HOOD PEEL AWAY FLYTE STAYCOOL (MISCELLANEOUS) ×9 IMPLANT
JET LAVAGE IRRISEPT WOUND (IRRIGATION / IRRIGATOR) ×2
KIT TURNOVER KIT A (KITS) IMPLANT
LAVAGE JET IRRISEPT WOUND (IRRIGATION / IRRIGATOR) ×1 IMPLANT
MANIFOLD NEPTUNE II (INSTRUMENTS) ×2 IMPLANT
MARKER SKIN DUAL TIP RULER LAB (MISCELLANEOUS) ×2 IMPLANT
NDL SPNL 18GX3.5 QUINCKE PK (NEEDLE) ×1 IMPLANT
NEEDLE SPNL 18GX3.5 QUINCKE PK (NEEDLE) ×2 IMPLANT
NS IRRIG 1000ML POUR BTL (IV SOLUTION) ×2 IMPLANT
PACK ICE MAXI GEL EZY WRAP (MISCELLANEOUS) ×1 IMPLANT
PADDING CAST ABS 6INX4YD NS (CAST SUPPLIES) ×1
PADDING CAST ABS COTTON 6X4 NS (CAST SUPPLIES) IMPLANT
PADDING CAST COTTON 6X4 STRL (CAST SUPPLIES) IMPLANT
PROTECTOR NERVE ULNAR (MISCELLANEOUS) ×2 IMPLANT
SAW OSC TIP CART 19.5X105X1.3 (SAW) ×2 IMPLANT
SEALER BIPOLAR AQUA 6.0 (INSTRUMENTS) ×2 IMPLANT
SET HNDPC FAN SPRY TIP SCT (DISPOSABLE) ×1 IMPLANT
SET PAD KNEE POSITIONER (MISCELLANEOUS) ×2 IMPLANT
SPONGE DRAIN TRACH 4X4 STRL 2S (GAUZE/BANDAGES/DRESSINGS) ×2 IMPLANT
SPONGE LAP 18X18 RF (DISPOSABLE) IMPLANT
STEM FEM VG 16X120 (Stem) ×1 IMPLANT
STEM SPLINED KNEE 16MMX80MM (Joint) ×1 IMPLANT
SUT MNCRL AB 3-0 PS2 18 (SUTURE) ×2 IMPLANT
SUT MON AB 2-0 CT1 36 (SUTURE) ×4 IMPLANT
SUT STRATAFIX PDO 1 14 VIOLET (SUTURE) ×2
SUT STRATFX PDO 1 14 VIOLET (SUTURE) ×1
SUT VIC AB 1 CTX 36 (SUTURE) ×4
SUT VIC AB 1 CTX36XBRD ANBCTR (SUTURE) ×2 IMPLANT
SUT VIC AB 2-0 CT1 27 (SUTURE) ×2
SUT VIC AB 2-0 CT1 TAPERPNT 27 (SUTURE) ×1 IMPLANT
SUTURE STRATFX PDO 1 14 VIOLET (SUTURE) ×1 IMPLANT
TIBIAL TRAY KNEE (Knees) ×2 IMPLANT
TOWER CARTRIDGE SMART MIX (DISPOSABLE) ×4 IMPLANT
TRAY FOLEY MTR SLVR 16FR STAT (SET/KITS/TRAYS/PACK) ×2 IMPLANT
TRAY TIBIAL KNEE (Knees) IMPLANT
WATER STERILE IRR 1000ML POUR (IV SOLUTION) ×2 IMPLANT
WRAP KNEE MAXI GEL POST OP (GAUZE/BANDAGES/DRESSINGS) ×2 IMPLANT
YANKAUER SUCT BULB TIP 10FT TU (MISCELLANEOUS) ×2 IMPLANT

## 2019-01-09 NOTE — Op Note (Signed)
OPERATIVE REPORT   01/09/2019  11:10 AM  PATIENT:  Stephanie Hammond   SURGEON:  Bertram Savin, MD  ASSISTANT:  Staff.   PREOPERATIVE DIAGNOSIS:  Failed left total knee arthroplasty.  POSTOPERATIVE DIAGNOSIS:  Same.  PROCEDURE: Revision left total knee arthroplasty including femoral and tibial components.  ANESTHESIA:   GETA.  ANTIBIOTICS: 1 g vancomycin.  ESTIMATED BLOOD LOSS: 150 cc.  EXPLANTS:  DePuy Sigma PS femur. Sigma fixed bearing tibial tray. PS poly-insert.  IMPLANTS: Biomet 360 femoral component size 62.5 mm with 5 mm distal augment x2 and 16 x 120 mm splined stem. Biomet 360 tibial tray size 63 mm with small cruciate wing, 5 mm offset adapter, and 16 x 80 mm splined stem. 81mm constrained PS tibial bearing. Refobacin R bone cement.  SPECIMENS: Left knee synovial fluid for cell count with differential and culture.  COMPLICATIONS: None.  DISPOSITION: Stable to PACU.  SURGICAL INDICATIONS:  Stephanie Hammond is a 68 y.o. 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  Preoperative labs were obtained.  Bone scan revealed loosening of the tibia.  Due to persistent pain and inability to function, she was indicated for revision total knee arthroplasty.  The risks, benefits, and alternatives were discussed with the patient. There are risks associated with the surgery including, but not limited to, problems with anesthesia (death), infection, instability (giving out of the joint), dislocation, differences in leg length/angulation/rotation, fracture of bones, loosening or failure of implants, hematoma (blood accumulation) which may require surgical drainage, blood clots, pulmonary embolism, nerve injury (foot drop and lateral thigh numbness), and blood vessel injury. The patient understands these risks and elects to proceed.  PROCEDURE IN DETAIL: The patient was identified in the holding area using 2  identifiers.  The surgical site was marked by myself.  Regional block was placed by anesthesia.  She was taken to the operating room, placed supine on the operating room table.  General anesthesia was obtained.  Foley catheter was inserted.  All bony prominences were well-padded.  Nonsterile tourniquet was applied to the left thigh.  The left lower extremity was prepped and draped in the normal sterile surgical fashion.  Timeout was called, verifying site and site of surgery.  She did receive preop antibiotics within 60 minutes of beginning the procedure.  After Esmarch exsanguination, the tourniquet was elevated to 320 mmHg.  I used a #10 blade to sharply excise her previous anterior knee scar.  Full-thickness skin flaps were created.  Using an 18-gauge needle, I then aspirated the knee, obtaining about 10 cc of straw-colored joint fluid that became bloody.  This was sent to the lab for cell count with differential and culture.  A standard medial parapatellar arthrotomy was made.  Medial release was performed.  The knee was brought into full extension.  No purulence or evidence of infection.  Radical synovectomy of the medial gutter, lateral gutter, and suprapatellar pouch were performed.  I sharply excised the infrapatellar scar using Bovie electrocautery.  I then examined the knee replacement components.  The patella was well fixed and therefore not revised.  The femoral component was well fixed.  The tibial component was grossly loose and subsided into varus alignment.  Using an ACL saw blade, I interrupted the implant cement interface of the femoral component.  I then used flexible osteotomes.  Osteotome was used to remove the polyethylene liner, which was passed off to the back table.  The  femoral component was then removed without any significant bone loss.  The tibial component was removed as well.  There was a contained defect to the medial tibial plateau.  I curetted this area to a stable bony base.   Drill bit was used to enter the medullary canals of the distal femur and proximal tibia.  I then reamed up sequentially to a 16 mm reamer on the tibia and 16 mm reamer on the femur as well.    Using an intramedullary cutting guide, the proximal tibia cut was freshened.  I sized the proximal tibia.  Planned for a 5 mm adapter.  The proximal tibia was prepared.  The tibial trial was assembled on the back table and inserted with excellent fit.  I then turned my attention to the distal femur.  The femur was sized and I planned for 120 mm straight stem.  Extension gap was slightly looser than flexion gap, so therefore I build the femoral trial with 5 mm distal femoral augments x2.  The rotation of the femoral component was acceptable.  I cut the distal femur for the box.  The femoral trial was assembled on the back table and inserted.  I then placed a trial liner.  Flexion and extension gaps perfectly balanced.  Excellent varus/valgus stability throughout a range of motion.  All the trial components were then removed.  The cut bony surfaces were irrigated with pulse lavage.  The real components were opened and assembled on the back table.  The tibial component was cemented using hybrid fixation technique, followed by the femoral component.  Excess cement was cleared.  The trial liner was placed and the knee was brought into extension while the cement polymerized.  The trial liner was then exchanged for the real liner.  The locking clip was placed.  Stability testing was repeated and unchanged.  The knee was then copiously irrigated with Irrisept solution followed by normal saline with pulse lavage.  Hemostasis was achieved with the Aquamantis and Bovie electrocautery.  The arthrotomy was closed with #1 Vicryl and #1 strata fix over a medium Hemovac drain.  Deep fatty tissue was closed with 2-0 Vicryl.  Deep dermal layer closed with 2-0 Monocryl interrupted sutures.  Skin reapproximated with staples.  Dermabond was  applied to the skin.  Once the glue was dried, an Aquasol dressing was applied.  Sterile bulky dressing applied to the lower extremity.  She was then awakened from anesthesia, and taken to the PACU in stable condition.  Sponge, needle, and instrument counts were correct at the end of the case x2.  There were no known complications.

## 2019-01-09 NOTE — Discharge Instructions (Signed)
° °Dr. Brian Swinteck °Total Joint Specialist °[facility] °[address] °[place], [place] [address] °[phone] ° °TOTAL KNEE REPLACEMENT POSTOPERATIVE DIRECTIONS ° ° ° °Knee Rehabilitation, Guidelines Following Surgery  °Results after knee surgery are often greatly improved when you follow the exercise, range of motion and muscle strengthening exercises prescribed by your doctor. Safety measures are also important to protect the knee from further injury. Any time any of these exercises cause you to have increased pain or swelling in your knee joint, decrease the amount until you are comfortable again and slowly increase them. If you have problems or questions, call your caregiver or physical therapist for advice.  ° °WEIGHT BEARING °Weight bearing as tolerated with assist device (walker, cane, etc) as directed, use it as long as suggested by your surgeon or therapist, typically at least 4-6 weeks. ° °HOME CARE INSTRUCTIONS  °Remove items at home which could result in a fall. This includes throw rugs or furniture in walking pathways.  °Continue medications as instructed at time of discharge. °You may have some home medications which will be placed on hold until you complete the course of blood thinner medication.  °You may start showering once you are discharged home but do not submerge the incision under water. Just pat the incision dry and apply a dry gauze dressing on daily. °Walk with walker as instructed.  °You may resume a sexual relationship in one month or when given the OK by your doctor.  °· Use walker as long as suggested by your caregivers. °· Avoid periods of inactivity such as sitting longer than an hour when not asleep. This helps prevent blood clots.  °You may put full weight on your legs and walk as much as is comfortable.  °You may return to work once you are cleared by your doctor.  °Do not drive a car for 6 weeks or until released by you surgeon.  °· Do not drive  while taking narcotics.  °Wear the elastic stockings for three weeks following surgery during the day but you may remove then at night. °Make sure you keep all of your appointments after your operation with all of your doctors and caregivers. You should call the office at the above phone number and make an appointment for approximately two weeks after the date of your surgery. °Do not remove your surgical dressing. The dressing is waterproof; you may take showers in 3 days, but do not take tub baths or submerge the dressing. °Please pick up a stool softener and laxative for home use as long as you are requiring pain medications. °· ICE to the affected knee every three hours for 30 minutes at a time and then as needed for pain and swelling.  Continue to use ice on the knee for pain and swelling from surgery. You may notice swelling that will progress down to the foot and ankle.  This is normal after surgery.  Elevate the leg when you are not up walking on it.   °It is important for you to complete the blood thinner medication as prescribed by your doctor. °· Continue to use the breathing machine which will help keep your temperature down.  It is common for your temperature to cycle up and down following surgery, especially at night when you are not up moving around and exerting yourself.  The breathing machine keeps your lungs expanded and your temperature down. ° °RANGE OF MOTION AND STRENGTHENING EXERCISES  °Rehabilitation of the knee is important following   a knee injury or an operation. After just a few days of immobilization, the muscles of the thigh which control the knee become weakened and shrink (atrophy). Knee exercises are designed to build up the tone and strength of the thigh muscles and to improve knee motion. Often times heat used for twenty to thirty minutes before working out will loosen up your tissues and help with improving the range of motion but do not use heat for the first two weeks following  surgery. These exercises can be done on a training (exercise) mat, on the floor, on a table or on a bed. Use what ever works the best and is most comfortable for you Knee exercises include:  °Leg Lifts - While your knee is still immobilized in a splint or cast, you can do straight leg raises. Lift the leg to 60 degrees, hold for 3 sec, and slowly lower the leg. Repeat 10-20 times 2-3 times daily. Perform this exercise against resistance later as your knee gets better.  °Quad and Hamstring Sets - Tighten up the muscle on the front of the thigh (Quad) and hold for 5-10 sec. Repeat this 10-20 times hourly. Hamstring sets are done by pushing the foot backward against an object and holding for 5-10 sec. Repeat as with quad sets.  °A rehabilitation program following serious knee injuries can speed recovery and prevent re-injury in the future due to weakened muscles. Contact your doctor or a physical therapist for more information on knee rehabilitation.  ° °SKILLED REHAB INSTRUCTIONS: °If the patient is transferred to a skilled rehab facility following release from the hospital, a list of the current medications will be sent to the facility for the patient to continue.  When discharged from the skilled rehab facility, please have the facility set up the patient's Home Health Physical Therapy prior to being released. Also, the skilled facility will be responsible for providing the patient with their medications at time of release from the facility to include their pain medication, the muscle relaxants, and their blood thinner medication. If the patient is still at the rehab facility at time of the two week follow up appointment, the skilled rehab facility will also need to assist the patient in arranging follow up appointment in our office and any transportation needs. ° °MAKE SURE YOU:  °Understand these instructions.  °Will watch your condition.  °Will get help right away if you are not doing well or get worse.   ° ° °Pick up stool softner and laxative for home use following surgery while on pain medications. °Do NOT remove your dressing. You may shower.  °Do not take tub baths or submerge incision under water. °May shower starting three days after surgery. °Please use a clean towel to pat the incision dry following showers. °Continue to use ice for pain and swelling after surgery. °Do not use any lotions or creams on the incision until instructed by your surgeon. ° °Information on my medicine - ELIQUIS® (apixaban) ° °This medication education was reviewed with me or my healthcare representative as part of my discharge preparation.  The pharmacist that spoke with me during my hospital stay was:   ° °Why was Eliquis® prescribed for you? °Eliquis® was prescribed for you to reduce the risk of blood clots forming after orthopedic surgery.   ° °What do You need to know about Eliquis®? °Take your Eliquis® TWICE DAILY - one tablet in the morning and one tablet in the evening with or without food.  It would   be best to take the dose about the same time each day. ° °If you have difficulty swallowing the tablet whole please discuss with your pharmacist how to take the medication safely. ° °Take Eliquis® exactly as prescribed by your doctor and DO NOT stop taking Eliquis® without talking to the doctor who prescribed the medication.  Stopping without other medication to take the place of Eliquis® may increase your risk of developing a clot. ° °After discharge, you should have regular check-up appointments with your healthcare provider that is prescribing your Eliquis®. ° °What do you do if you miss a dose? °If a dose of ELIQUIS® is not taken at the scheduled time, take it as soon as possible on the same day and twice-daily administration should be resumed.  The dose should not be doubled to make up for a missed dose.  Do not take more than one tablet of ELIQUIS at the same time. ° °Important Safety Information °A possible side effect of  Eliquis® is bleeding. You should call your healthcare provider right away if you experience any of the following: °? Bleeding from an injury or your nose that does not stop. °? Unusual colored urine (red or dark brown) or unusual colored stools (red or black). °? Unusual bruising for unknown reasons. °? A serious fall or if you hit your head (even if there is no bleeding). ° °Some medicines may interact with Eliquis® and might increase your risk of bleeding or clotting while on Eliquis®. To help avoid this, consult your healthcare provider or pharmacist prior to using any new prescription or non-prescription medications, including herbals, vitamins, non-steroidal anti-inflammatory drugs (NSAIDs) and supplements. ° °This website has more information on Eliquis® (apixaban): http://www.eliquis.com/eliquis/home ° ° °

## 2019-01-09 NOTE — Evaluation (Signed)
Physical Therapy Evaluation Patient Details Name: Stephanie Hammond MRN: [id number] DOB: March 14, 1951 Today's Date: 01/09/2019   History of Present Illness  Pt s/p revision of L TKR; pt with hx of DM, CAD, and bilat TKR  Clinical Impression  Pt s/p L TKR revision and presents with decreased L LE strength/ROM, post op pain and dizziness limiting functional mobility.  Pt should progress to dc home with assist of family and has first OP PT scheduled for Tuesday, June 9.    Follow Up Recommendations Follow surgeon's recommendation for DC plan and follow-up therapies    Equipment Recommendations  None recommended by PT    Recommendations for Other Services       Precautions / Restrictions Precautions Precautions: Knee;Fall Restrictions Weight Bearing Restrictions: No Other Position/Activity Restrictions: WBAT      Mobility  Bed Mobility Overal bed mobility: Needs Assistance Bed Mobility: Supine to Sit;Sit to Supine     Supine to sit: Min assist Sit to supine: Min assist   General bed mobility comments: use of bedrail and assist to manage L LE  Transfers Overall transfer level: Needs assistance Equipment used: Rolling walker (2 wheeled) Transfers: Sit to/from Stand Sit to Stand: +2 physical assistance;+2 safety/equipment;Min assist;Mod assist;From elevated surface         General transfer comment: cues for LE management and use of UEs to self assist  Ambulation/Gait Ambulation/Gait assistance: Min assist;+2 physical assistance;+2 safety/equipment Gait Distance (Feet): 4 Feet Assistive device: Rolling walker (2 wheeled) Gait Pattern/deviations: Step-to pattern;Decreased step length - right;Decreased step length - left;Shuffle;Trunk flexed Gait velocity: decr   General Gait Details: pt side stepped up side of bed only 2* c/o dizziness  Stairs            Wheelchair Mobility    Modified Rankin (Stroke Patients Only)       Balance Overall balance assessment:  Needs assistance Sitting-balance support: No upper extremity supported;Feet supported Sitting balance-Leahy Scale: Good     Standing balance support: Bilateral upper extremity supported Standing balance-Leahy Scale: Poor                               Pertinent Vitals/Pain Pain Assessment: Faces Faces Pain Scale: Hurts even more Pain Location: L knee Pain Descriptors / Indicators: Aching;Sore;Throbbing Pain Intervention(s): Limited activity within patient's tolerance;Monitored during session;Premedicated before session;Ice applied    Home Living Family/patient expects to be discharged to:: Private residence Living Arrangements: Spouse/significant other Available Help at Discharge: Family Type of Home: House Home Access: Stairs to enter Entrance Stairs-Rails: Doctor, general practice of Steps: 2 Home Layout: One level Home Equipment: Environmental consultant - 2 wheels;Bedside commode      Prior Function Level of Independence: Independent               Hand Dominance        Extremity/Trunk Assessment   Upper Extremity Assessment Upper Extremity Assessment: Overall WFL for tasks assessed    Lower Extremity Assessment Lower Extremity Assessment: LLE deficits/detail       Communication   Communication: No difficulties  Cognition Arousal/Alertness: Awake/alert Behavior During Therapy: WFL for tasks assessed/performed Overall Cognitive Status: Within Functional Limits for tasks assessed                                        General Comments  Exercises     Assessment/Plan    PT Assessment Patient needs continued PT services  PT Problem List Decreased strength;Decreased range of motion;Decreased activity tolerance;Decreased mobility;Decreased balance;Decreased knowledge of use of DME;Obesity;Pain       PT Treatment Interventions DME instruction;Gait training;Stair training;Functional mobility training;Therapeutic  activities;Therapeutic exercise;Patient/family education    PT Goals (Current goals can be found in the Care Plan section)  Acute Rehab PT Goals Patient Stated Goal: Regain IND PT Goal Formulation: With patient Time For Goal Achievement: 01/23/19 Potential to Achieve Goals: Good    Frequency 7X/week   Barriers to discharge        Co-evaluation               AM-PAC PT "6 Clicks" Mobility  Outcome Measure Help needed turning from your back to your side while in a flat bed without using bedrails?: A Lot Help needed moving from lying on your back to sitting on the side of a flat bed without using bedrails?: A Lot Help needed moving to and from a bed to a chair (including a wheelchair)?: A Lot Help needed standing up from a chair using your arms (e.g., wheelchair or bedside chair)?: A Lot Help needed to walk in hospital room?: A Lot Help needed climbing 3-5 steps with a railing? : A Lot 6 Click Score: 12    End of Session Equipment Utilized During Treatment: Gait belt Activity Tolerance: Patient limited by fatigue;Other (comment);Patient limited by pain Patient left: in bed;with call bell/phone within reach;with bed alarm set Nurse Communication: Mobility status PT Visit Diagnosis: Difficulty in walking, not elsewhere classified (R26.2);Pain Pain - Right/Left: Left Pain - part of body: Knee    Time: 1550-1610 PT Time Calculation (min) (ACUTE ONLY): 20 min   Charges:   PT Evaluation $PT Eval Low Complexity: 1 Low          Mauro Kaufmann PT Acute Rehabilitation Services Pager [phone] Office [phone]   Mio Schellinger 01/09/2019, 5:33 PM

## 2019-01-09 NOTE — H&P (Signed)
TOTAL KNEE REVISION ADMISSION H&P  Patient is being admitted for left revision total knee arthroplasty.  Subjective:  Chief Complaint:left knee pain.  HPI: Stephanie Hammond, 68 y.o. female, has a history of pain and functional disability in the left knee(s) due to failed previous arthroplasty and patient has failed non-surgical conservative treatments for greater than 12 weeks to include NSAID's and/or analgesics, flexibility and strengthening excercises, use of assistive devices, weight reduction as appropriate and activity modification. The indications for the revision of the total knee arthroplasty are loosening of one or more components. Onset of symptoms was gradual starting 4 years ago with gradually worsening course since that time.  Prior procedures on the left knee(s) include arthroplasty. Patient currently rates pain in the left knee(s) at 10 out of 10 with activity. There is night pain, worsening of pain with activity and weight bearing, pain that interferes with activities of daily living, pain with passive range of motion, crepitus and joint swelling.  Patient has evidence of prosthetic loosening by imaging studies. This condition presents safety issues increasing the risk of falls.  There is no current active infection.      Patient Active Problem List   Diagnosis Date Noted  . Anemia 12/31/2015  . Arthritis of knee, degenerative 03/12/2015  . Arthritis of left knee   . Gout 09/07/2014  . Back pain with left-sided radiculopathy 09/07/2014  . Type 2 diabetes mellitus with diabetic neuropathy ([place]) 02/14/2014  . Hyperlipidemia 03/12/2013  . IBS (irritable bowel syndrome) 01/09/2013  . Diabetic neuropathy ([place]) 11/14/2012  . Diarrhea 09/13/2012  . Major depression, single episode 09/13/2012  . Essential hypertension, benign 06/09/2012  . DDD (degenerative disc disease), lumbar 03/28/2012  . Chronic knee pain 03/28/2012  . Obesity 03/28/2012  . Tobacco user 03/28/2012   . CAD (coronary artery disease) 03/28/2012       Past Medical History:  Diagnosis Date  . Anemia   . Blood transfusion 2009   after GI bleed  . CAD (coronary artery disease)   . Diabetes mellitus   . Diverticulitis   . Diverticulosis   . GERD (gastroesophageal reflux disease)   . Gout   . History of GI diverticular bleed 08/2004  . Hypertension   . IBS (irritable bowel syndrome)   . Osteoarthritis          Past Surgical History:  Procedure Laterality Date  . CHOLECYSTECTOMY  2003  . COLONOSCOPY  2000   Dr. Fuller Plan: marked diverticulosis, difficult procedure due to adhesions, polyps benign  . COLONOSCOPY  2006   Dr. Henrene Pastor: severe pandiverticulosis  . COLONOSCOPY N/A 02/03/2013   SLF: 4 COLORECTAL POLYPS REMOVED/Moderate diverticulosis throughout the entire examined colon/Small internal hemorrhoids  . JOINT REPLACEMENT Right 2009   Knee Replacement Right   . PTCA    . REPLACEMENT TOTAL KNEE     right   . TOTAL KNEE ARTHROPLASTY Left 03/12/2015   Procedure: LEFT TOTAL KNEE REPLACEMENT;  Surgeon: Carole Civil, MD;  Location: [facility];  Service: Orthopedics;  Laterality: Left;  [name] VAGINAL HYSTERECTOMY             Current Outpatient Medications  Medication Sig Dispense Refill Last Dose  . allopurinol (ZYLOPRIM) 300 MG tablet TAKE 1 TABLET BY MOUTH EVERY DAY (Patient taking differently: Take 300 mg by mouth daily. ) 90 tablet 1 Taking  . amLODipine (NORVASC) 10 MG tablet TAKE 1 TABLET BY MOUTH EVERY DAY (Patient taking differently: Take 10 mg by mouth daily. ) 90  tablet 1 Taking  . atorvastatin (LIPITOR) 20 MG tablet Take 1 tablet (20 mg total) by mouth daily. (Patient not taking: Reported on 10/21/2018) 90 tablet 3 Not Taking at Unknown time  . ciprofloxacin (CIPRO) 500 MG tablet Take 1 tablet (500 mg total) by mouth 2 (two) times daily. (Patient not taking: Reported on 10/21/2018) 14 tablet 0 Not Taking at Unknown time  . fluconazole (DIFLUCAN) 150 MG  tablet Take 1 tablet repeat in 3 days (Patient not taking: Reported on 10/21/2018) 2 tablet 1 Not Taking at Unknown time  . glucose blood (ONE TOUCH ULTRA TEST) test strip USE AS DIRECTED TO TEST THREE TIMES DAILY 300 each 3 Taking  . insulin degludec (TRESIBA FLEXTOUCH) 100 UNIT/ML SOPN FlexTouch Pen Inject 0.12 mLs (12 Units total) into the skin daily at 10 pm. 2 pen 2 Taking  . Insulin Pen Needle (PEN NEEDLES 3/16") 31G X 5 MM MISC Inject 1 each into the skin daily. Use as directed to inject insulin daily. 100 each 3 Taking  . Lancets MISC Use as directed. 100 each 11 Taking  . losartan (COZAAR) 100 MG tablet TAKE 1 TABLET BY MOUTH EVERY DAY (Patient taking differently: Take 100 mg by mouth daily. ) 90 tablet 1 Taking  . meloxicam (MOBIC) 7.5 MG tablet TAKE 1 TABLET BY MOUTH EVERY DAY (Patient taking differently: Take 7.5 mg by mouth daily. ) 90 tablet 1 Taking  . metFORMIN (GLUCOPHAGE-XR) 500 MG 24 hr tablet TAKE 2 TABLETS(1000 MG) BY MOUTH DAILY WITH BREAKFAST (Patient taking differently: Take 500 mg by mouth daily with breakfast. TAKE 2 TABLETS(1000 MG) BY MOUTH DAILY WITH BREAKFAST) 180 tablet 2 Taking  . metoprolol succinate (TOPROL-XL) 25 MG 24 hr tablet TAKE 0.5 TABLETS (12.5 MG TOTAL) BY MOUTH DAILY. (Patient taking differently: Take 12.5 mg by mouth daily as needed (palpitations). ) 45 tablet 2 Taking  . nicotine (NICODERM CQ) 21 mg/24hr patch Place 1 patch (21 mg total) onto the skin daily. (Patient not taking: Reported on 10/21/2018) 28 patch 0 Not Taking at Unknown time  . omeprazole (PRILOSEC) 20 MG capsule TAKE 1 CAPSULE BY MOUTH EVERY DAY IN THE MORNING (Patient taking differently: Take 20 mg by mouth daily. TAKE 1 CAPSULE BY MOUTH EVERY DAY IN THE MORNING) 90 capsule 0 Taking  . oxyCODONE-acetaminophen (PERCOCET) 7.5-325 MG tablet Take 1 tablet by mouth every 6 (six) hours as needed for severe pain. 80 tablet 0   . phenazopyridine (PYRIDIUM) 100 MG tablet Take 1 tablet (100 mg total) by  mouth 3 (three) times daily as needed for pain. (Patient not taking: Reported on 10/21/2018) 10 tablet 0 Not Taking at Unknown time  . simvastatin (ZOCOR) 10 MG tablet TAKE 1 TABLET BY MOUTH EVERY DAY IN THE MORNING (Patient taking differently: Take 10 mg by mouth daily. ) 90 tablet 1 Taking   No current facility-administered medications for this visit.    Allergies  Allergen Reactions  . Ace Inhibitors Cough  . Bentyl [Dicyclomine Hcl] Other (See Comments)    Blurry vision  . Penicillins Itching and Swelling    Whelps Did it involve swelling of the face/tongue/throat, SOB, or low BP? No Did it involve sudden or severe rash/hives, skin peeling, or any reaction on the inside of your mouth or nose? No Did you need to seek medical attention at a hospital or doctor's office? No When did it last happen?20 years If all above answers are "NO", may proceed with cephalosporin use.     Social  History        Tobacco Use  . Smoking status: Current Every Day Smoker    Packs/day: 1.00    Years: 35.00    Pack years: 35.00    Types: Cigarettes  . Smokeless tobacco: Never Used  Substance Use Topics  . Alcohol use: No         Family History  Problem Relation Age of Onset  . Colon polyps Sister   . Cancer Mother        lung   . Cancer Father        stomach   . Stomach cancer Father        questionable  . Colon cancer Neg Hx       Review of Systems  Constitutional: Negative.   HENT: Negative.   Eyes: Negative.   Respiratory: Negative.   Cardiovascular: Negative.   Gastrointestinal: Negative.   Genitourinary: Negative.   Musculoskeletal: Positive for joint pain.  Skin: Negative.   Neurological: Negative.   Endo/Heme/Allergies: Negative.   Psychiatric/Behavioral: Negative.      Objective:  Physical Exam  Vitals reviewed. Constitutional: She is oriented to person, place, and time. She appears well-developed and well-nourished.  HENT:  Head:  Normocephalic.  Eyes: Pupils are equal, round, and reactive to light. Conjunctivae and EOM are normal.  Neck: Normal range of motion. Neck supple.  Cardiovascular: Normal rate, regular rhythm and intact distal pulses.  Respiratory: Effort normal. No respiratory distress.  GI: Soft. She exhibits no distension.  Genitourinary:    Genitourinary Comments: deferred   Musculoskeletal:     Left knee: She exhibits decreased range of motion and swelling. Tenderness found. Medial joint line and lateral joint line tenderness noted.       Legs:  Neurological: She is alert and oriented to person, place, and time. She has normal reflexes.  Skin: Skin is warm and dry.  Psychiatric: She has a normal mood and affect. Her behavior is normal. Judgment and thought content normal.    Vital signs in last 24 hours: @VSRANGES @  Labs:  Estimated body mass index is 36.85 kg/m as calculated from the following:   Height as of 10/09/18: 5\' 3"  (1.6 m).   Weight as of 10/09/18: 94.3 kg.  Imaging Review Plain radiographs demonstrate severe degenerative joint disease of the left knee(s). The overall alignment is neutral.There is evidence of loosening of the tibial components. The bone quality appears to be adequate for age and reported activity level.    Assessment/Plan:  End stage arthritis, left knee(s) with failed previous arthroplasty.   The patient history, physical examination, clinical judgment of the provider and imaging studies are consistent with end stage degenerative joint disease of the left knee(s), previous total knee arthroplasty. Revision total knee arthroplasty is deemed medically necessary. The treatment options including medical management, injection therapy, arthroscopy and revision arthroplasty were discussed at length. The risks and benefits of revision total knee arthroplasty were presented and reviewed. The risks due to aseptic loosening, infection, stiffness, patella tracking  problems, thromboembolic complications and other imponderables were discussed. The patient acknowledged the explanation, agreed to proceed with the plan and consent was signed. Patient is being admitted for inpatient treatment for surgery, pain control, PT, OT, prophylactic antibiotics, VTE prophylaxis, progressive ambulation and ADL's and discharge planning.The patient is planning to be discharged home with OPPT in [place].

## 2019-01-09 NOTE — Anesthesia Procedure Notes (Signed)
Anesthesia Regional Block: Adductor canal block   Pre-Anesthetic Checklist: ,, timeout performed, Correct Patient, Correct Site, Correct Laterality, Correct Procedure, Correct Position, site marked, Risks and benefits discussed,  Surgical consent,  Pre-op evaluation,  At surgeon's request and post-op pain management  Laterality: Left and Lower  Prep: chloraprep       Needles:  Injection technique: Single-shot     Needle Length: 9cm  Needle Gauge: 22     Additional Needles: Arrow StimuQuik ECHO Echogenic Stimulating PNB Needle  Procedures:,,,, ultrasound used (permanent image in chart),,,,  Narrative:  Start time: 01/09/2019 8:06 AM End time: 01/09/2019 8:10 AM Injection made incrementally with aspirations every 5 mL.  Performed by: Personally  Anesthesiologist: Oleta Mouse, MD

## 2019-01-09 NOTE — Transfer of Care (Signed)
Immediate Anesthesia Transfer of Care Note  Patient: Stephanie Hammond  Procedure(s) Performed: TOTAL KNEE REVISION (Left Knee)  Patient Location: PACU  Anesthesia Type:General  Level of Consciousness: awake, alert , oriented and patient cooperative  Airway & Oxygen Therapy: Patient Spontanous Breathing and Patient connected to face mask oxygen  Post-op Assessment: Report given to RN, Post -op Vital signs reviewed and stable and Patient moving all extremities  Post vital signs: Reviewed and stable  Last Vitals:  Vitals Value Taken Time  BP 139/78 01/09/2019 11:27 AM  Temp    Pulse    Resp 25 01/09/2019 11:29 AM  SpO2    Vitals shown include unvalidated device data.  Last Pain:  Vitals:   01/09/19 0630  TempSrc:   PainSc: 4       Patients Stated Pain Goal: 3 (58/30/94 0768)  Complications: No apparent anesthesia complications

## 2019-01-09 NOTE — Anesthesia Postprocedure Evaluation (Signed)
Anesthesia Post Note  Patient: AIYLA BAUCOM  Procedure(s) Performed: TOTAL KNEE REVISION (Left Knee)     Patient location during evaluation: PACU Anesthesia Type: Regional, MAC and Spinal Level of consciousness: awake and alert Pain management: pain level controlled Vital Signs Assessment: post-procedure vital signs reviewed and stable Respiratory status: spontaneous breathing, nonlabored ventilation, respiratory function stable and patient connected to nasal cannula oxygen Cardiovascular status: stable and blood pressure returned to baseline Postop Assessment: no apparent nausea or vomiting and spinal receding Anesthetic complications: no    Last Vitals:  Vitals:   01/09/19 1300 01/09/19 1355  BP: 139/76 (!) 155/86  Pulse: 98 98  Resp: 16 12  Temp: (!) 36.4 C (!) 36.4 C  SpO2: 99% 97%    Last Pain:  Vitals:   01/09/19 1355  TempSrc: Axillary  PainSc:                  Jaspal Pultz

## 2019-01-09 NOTE — Anesthesia Procedure Notes (Signed)
Procedure Name: Intubation Date/Time: 01/09/2019 8:04 AM Performed by: Claudia Desanctis, CRNA Pre-anesthesia Checklist: Patient identified, Emergency Drugs available, Suction available and Patient being monitored Patient Re-evaluated:Patient Re-evaluated prior to induction Oxygen Delivery Method: Circle system utilized Preoxygenation: Pre-oxygenation with 100% oxygen Induction Type: IV induction Ventilation: Mask ventilation without difficulty Laryngoscope Size: 2 and Miller Grade View: Grade I Tube type: Oral Number of attempts: 1 Airway Equipment and Method: Stylet Placement Confirmation: ETT inserted through vocal cords under direct vision,  positive ETCO2 and breath sounds checked- equal and bilateral Secured at: 22 cm Tube secured with: Tape Dental Injury: Teeth and Oropharynx as per pre-operative assessment

## 2019-01-10 LAB — CBC
HCT: 28.3 % — ABNORMAL LOW (ref 36.0–46.0)
Hemoglobin: 9.6 g/dL — ABNORMAL LOW (ref 12.0–15.0)
MCH: 31.1 pg (ref 26.0–34.0)
MCHC: 33.9 g/dL (ref 30.0–36.0)
MCV: 91.6 fL (ref 80.0–100.0)
Platelets: 252 10*3/uL (ref 150–400)
RBC: 3.09 MIL/uL — ABNORMAL LOW (ref 3.87–5.11)
RDW: 13.2 % (ref 11.5–15.5)
WBC: 9.1 10*3/uL (ref 4.0–10.5)
nRBC: 0 % (ref 0.0–0.2)

## 2019-01-10 LAB — BASIC METABOLIC PANEL
Anion gap: 5 (ref 5–15)
BUN: 17 mg/dL (ref 8–23)
CO2: 20 mmol/L — ABNORMAL LOW (ref 22–32)
Calcium: 8.2 mg/dL — ABNORMAL LOW (ref 8.9–10.3)
Chloride: 112 mmol/L — ABNORMAL HIGH (ref 98–111)
Creatinine, Ser: 0.7 mg/dL (ref 0.44–1.00)
GFR calc Af Amer: >60 mL/min (ref >60)
GFR calc non Af Amer: >60 mL/min (ref >60)
Glucose, Bld: 211 mg/dL — ABNORMAL HIGH (ref 70–99)
Potassium: 4.5 mmol/L (ref 3.5–5.1)
Sodium: 137 mmol/L (ref 135–145)

## 2019-01-10 LAB — GLUCOSE, CAPILLARY
Glucose-Capillary: 115 mg/dL — ABNORMAL HIGH (ref 70–99)
Glucose-Capillary: 211 mg/dL — ABNORMAL HIGH (ref 70–99)
Glucose-Capillary: 233 mg/dL — ABNORMAL HIGH (ref 70–99)
Glucose-Capillary: 174 mg/dL — ABNORMAL HIGH (ref 70–99)

## 2019-01-10 MED ORDER — SENNA 8.6 MG PO TABS: 1.0000 | ORAL_TABLET | Freq: Two times a day (BID) | ORAL | 0 refills | Status: DC

## 2019-01-10 MED ORDER — APIXABAN 2.5 MG PO TABS: 2.5000 mg | ORAL_TABLET | Freq: Two times a day (BID) | ORAL | 0 refills | Status: DC

## 2019-01-10 MED ORDER — ONDANSETRON HCL 4 MG PO TABS: 4.0000 mg | ORAL_TABLET | Freq: Four times a day (QID) | ORAL | 0 refills | Status: DC | PRN

## 2019-01-10 MED ORDER — OXYCODONE HCL 5 MG PO TABS: 5.0000 mg | ORAL_TABLET | ORAL | 0 refills | Status: DC | PRN

## 2019-01-10 MED ORDER — DOCUSATE SODIUM 100 MG PO CAPS: 100.0000 mg | ORAL_CAPSULE | Freq: Two times a day (BID) | ORAL | 1 refills | Status: DC

## 2019-01-10 MED ORDER — ACETAMINOPHEN 325 MG PO TABS: 325.0000 mg | ORAL_TABLET | Freq: Four times a day (QID) | ORAL | 0 refills | Status: DC | PRN

## 2019-01-10 NOTE — Discharge Summary (Signed)
Physician Discharge Summary  Patient ID: Stephanie Hammond MRN: [id number] DOB/AGE: 08-18-1950 68 y.o.  Admit date: 01/09/2019 Discharge date: 01/11/2019  Admission Diagnoses:  Failed total knee, left, initial encounter [facility])  Discharge Diagnoses:  Principal Problem:   Failed total knee, left, initial encounter [facility]   Past Medical History:  Diagnosis Date  . Anemia   . Blood transfusion 2009   after GI bleed  . CAD (coronary artery disease)   . Diabetes mellitus    2  . Diverticulitis   . Diverticulosis   . Fear of local anesthetic    01-02-2019, patient states with last surgery in 2016 , she refused to have epidural before she was sedated d/t to extreme aversion to needles near her back    . GERD (gastroesophageal reflux disease)   . Gout   . Headache    occ if sugar is  too hi or low   . Heart murmur    "i was told this years and years and years ago "  . History of GI diverticular bleed 08/2004  . Hypertension   . IBS (irritable bowel syndrome)   . Osteoarthritis   . Tachycardia    "it stays like that for years between 119 and 121 and it doesnt bother me  none "     Surgeries: Procedure(s): TOTAL KNEE REVISION on 01/09/2019   Consultants (if any):   Discharged Condition: Improved  Hospital Course: Stephanie Hammond is an 69 y.o. female who was admitted 01/09/2019 with a diagnosis of Failed total knee, left, initial encounter (HCC) and went to the operating room on 01/09/2019 and underwent the above named procedures.    She was given perioperative antibiotics:  Anti-infectives (From admission, onward)   Start     Dose/Rate Route Frequency Ordered Stop   01/09/19 2000  vancomycin (VANCOCIN) IVPB 1000 mg/200 mL premix  Status:  Discontinued     1,000 mg 200 mL/hr over 60 Minutes Intravenous Every 12 hours 01/09/19 1306 01/09/19 1318   01/09/19 2000  vancomycin (VANCOCIN) 1,000 mg in sodium chloride 0.9 % 250 mL IVPB     1,000 mg 250 mL/hr over 60 Minutes Intravenous  Every 12 hours 01/09/19 1318 01/09/19 2150   01/09/19 0730  vancomycin (VANCOCIN) IVPB 1000 mg/200 mL premix     1,000 mg 200 mL/hr over 60 Minutes Intravenous On call to O.R. 01/09/19 0725 01/09/19 0856   01/09/19 4098  vancomycin (VANCOCIN) 1-5 GM/200ML-% IVPB    Note to Pharmacy:  Nadene Rubins   : cabinet override      01/09/19 1191 01/09/19 0756    .  She was given sequential compression devices, early ambulation, and apixaban for DVT prophylaxis.  She benefited maximally from the hospital stay and there were no complications.    Recent vital signs:  Vitals:   01/10/19 2111 01/11/19 0537  BP: (!) 154/81 (!) 165/80  Pulse: (!) 106 (!) 104  Resp: 18 18  Temp: 98.2 F (36.8 C) (!) 97.4 F (36.3 C)  SpO2: 100% 95%    Recent laboratory studies:  Lab Results  Component Value Date   HGB 10.6 (L) 01/11/2019   HGB 9.6 (L) 01/10/2019   HGB 12.5 01/02/2019   Lab Results  Component Value Date   WBC 8.9 01/11/2019   PLT 288 01/11/2019   Lab Results  Component Value Date   INR 1.07 03/08/2015   Lab Results  Component Value Date   NA 137 01/10/2019   K 4.5  01/10/2019   CL 112 (H) 01/10/2019   CO2 20 (L) 01/10/2019   BUN 17 01/10/2019   CREATININE 0.70 01/10/2019   GLUCOSE 211 (H) 01/10/2019    Discharge Medications:   Allergies as of 01/11/2019      Reactions   Ace Inhibitors Cough   Bentyl [dicyclomine Hcl] Other (See Comments)   Blurry vision   Penicillins Itching, Swelling   Whelps Did it involve swelling of the face/tongue/throat, SOB, or low BP? No Did it involve sudden or severe rash/hives, skin peeling, or any reaction on the inside of your mouth or nose? No Did you need to seek medical attention at a hospital or doctor's office? No When did it last happen?20 years If all above answers are "NO", may proceed with cephalosporin use.      Medication List    STOP taking these medications   meloxicam 7.5 MG tablet Commonly known as:  MOBIC    oxyCODONE-acetaminophen 7.5-325 MG tablet Commonly known as:  Percocet     TAKE these medications   acetaminophen 325 MG tablet Commonly known as:  TYLENOL Take 1-2 tablets (325-650 mg total) by mouth every 6 (six) hours as needed for mild pain (pain score 1-3 or temp > 100.5).   allopurinol 300 MG tablet Commonly known as:  ZYLOPRIM TAKE 1 TABLET BY MOUTH EVERY DAY   amLODipine 10 MG tablet Commonly known as:  NORVASC TAKE 1 TABLET BY MOUTH EVERY DAY   apixaban 2.5 MG Tabs tablet Commonly known as:  ELIQUIS Take 1 tablet (2.5 mg total) by mouth every 12 (twelve) hours.   atorvastatin 20 MG tablet Commonly known as:  LIPITOR TAKE 1 TABLET BY MOUTH EVERY DAY   B-D UF III MINI PEN NEEDLES 31G X 5 MM Misc Generic drug:  Insulin Pen Needle INJECT 1 EACH INTO THE SKIN DAILY. USE AS DIRECTED TO INJECT INSULIN DAILY.   docusate sodium 100 MG capsule Commonly known as:  COLACE Take 1 capsule (100 mg total) by mouth 2 (two) times daily.   glucose blood test strip Commonly known as:  ONE TOUCH ULTRA TEST USE AS DIRECTED TO TEST THREE TIMES DAILY   insulin degludec 100 UNIT/ML Sopn FlexTouch Pen Commonly known as:  Evaristo Bury FlexTouch Inject 0.12 mLs (12 Units total) into the skin daily at 10 pm.   insulin glargine 100 UNIT/ML injection Commonly known as:  LANTUS Inject 12 Units into the skin at bedtime. Uses interchangeably with Evaristo Bury if she cant get the Lantus   Lancets Misc Use as directed.   losartan 100 MG tablet Commonly known as:  COZAAR TAKE 1 TABLET BY MOUTH EVERY DAY   metFORMIN 500 MG 24 hr tablet Commonly known as:  GLUCOPHAGE-XR TAKE 2 TABLETS(1000 MG) BY MOUTH DAILY WITH BREAKFAST   omeprazole 20 MG capsule Commonly known as:  PRILOSEC Take 1 capsule (20 mg total) by mouth daily. TAKE 1 CAPSULE BY MOUTH EVERY DAY IN THE MORNING   ondansetron 4 MG tablet Commonly known as:  ZOFRAN Take 1 tablet (4 mg total) by mouth every 6 (six) hours as needed for  nausea.   polyethylene glycol powder 17 GM/SCOOP powder Commonly known as:  GLYCOLAX/MIRALAX 1 scoop daily or as needed What changed:    how much to take  how to take this  when to take this  additional instructions   senna 8.6 MG Tabs tablet Commonly known as:  SENOKOT Take 1 tablet (8.6 mg total) by mouth 2 (two) times daily.  Diagnostic Studies: Dg Knee Left Port  Result Date: 01/09/2019 CLINICAL DATA:  Postop fell total knee. EXAM: PORTABLE LEFT KNEE - 1-2 VIEW COMPARISON:  03/12/2015. FINDINGS: Total left knee replacement revision. Hardware intact. Anatomic alignment. No acute bony abnormality. IMPRESSION: Total left knee replacement revision with anatomic alignment. Hardware intact. Electronically Signed   By: Maisie Fus  Register   On: 01/09/2019 13:36    Disposition:   Discharge Instructions    Call MD / Call 911   Complete by:  As directed    If you experience chest pain or shortness of breath, CALL 911 and be transported to the hospital emergency room.  If you develope a fever above 101 F, pus (white drainage) or increased drainage or redness at the wound, or calf pain, call your surgeon's office.   Constipation Prevention   Complete by:  As directed    Drink plenty of fluids.  Prune juice may be helpful.  You may use a stool softener, such as Colace (over the counter) 100 mg twice a day.  Use MiraLax (over the counter) for constipation as needed.   Diet - low sodium heart healthy   Complete by:  As directed    Increase activity slowly as tolerated   Complete by:  As directed       Follow-up Information    Jamarii Banks, Arlys John, MD. Schedule an appointment as soon as possible for a visit in 2 weeks.   Specialty:  Orthopedic Surgery Why:  For wound re-check Contact information: [address] [place] [address] [place] [address] [phone]            Signed: Iline Oven Idalis Hoelting 01/13/2019, 11:15 AM

## 2019-01-10 NOTE — Progress Notes (Signed)
Physical Therapy Treatment Patient Details Name: SALEAH ANGERS MRN: [id number] DOB: [birth year] Today's Date: 01/10/2019    History of Present Illness Pt s/p revision of L TKR; pt with hx of DM, CAD, and bilat TKR    PT Comments    POD # 1 pm session Pt just got back to bed from using BSC c/o fatigue and pain.  So performed TKR TE's in bed followed by ICE.   Follow Up Recommendations  Follow surgeon's recommendation for DC plan and follow-up therapies     Equipment Recommendations  None recommended by PT    Recommendations for Other Services       Precautions / Restrictions Precautions Precautions: Knee;Fall Precaution Comments: reviewed no pillow under knee Restrictions Weight Bearing Restrictions: No Other Position/Activity Restrictions: WBAT       Balance                                            Cognition Arousal/Alertness: Awake/alert Behavior During Therapy: WFL for tasks assessed/performed Overall Cognitive Status: Within Functional Limits for tasks assessed                                        Exercises   Then returned to room to perform some TE's following HEP handout.  Instructed on proper tech, freq as well as use of ICE.      General Comments        Pertinent Vitals/Pain Pain Assessment: 0-10 Pain Score: 8  Pain Location: L knee Pain Descriptors / Indicators: Aching;Sore;Throbbing Pain Intervention(s): Monitored during session;Repositioned;Premedicated before session;Ice applied    Home Living                      Prior Function            PT Goals (current goals can now be found in the care plan section) Progress towards PT goals: Progressing toward goals    Frequency    7X/week      PT Plan Current plan remains appropriate    Co-evaluation              AM-PAC PT "6 Clicks" Mobility   Outcome Measure  Help needed turning from your back to your side while in a flat bed  without using bedrails?: A Little Help needed moving from lying on your back to sitting on the side of a flat bed without using bedrails?: A Little Help needed moving to and from a bed to a chair (including a wheelchair)?: A Little Help needed standing up from a chair using your arms (e.g., wheelchair or bedside chair)?: A Little Help needed to walk in hospital room?: A Little Help needed climbing 3-5 steps with a railing? : A Lot 6 Click Score: 17    End of Session Equipment Utilized During Treatment: Gait belt Activity Tolerance: Patient limited by fatigue;Other (comment);Patient limited by pain Patient left: in bed;with call bell/phone within reach;with bed alarm set Nurse Communication: Mobility status PT Visit Diagnosis: Difficulty in walking, not elsewhere classified (R26.2);Pain Pain - Right/Left: Left Pain - part of body: Knee     Time: 3244-0102 PT Time Calculation (min) (ACUTE ONLY): 19 min  Charges:   $Therapeutic Exercise: 8-22 mins  Felecia Shelling  PTA Acute  Rehabilitation Services Pager      [phone] Office      [phone]

## 2019-01-10 NOTE — Progress Notes (Signed)
Physical Therapy Treatment Patient Details Name: Stephanie Hammond MRN: [id number] DOB: May 20, 1951 Today's Date: 01/10/2019    History of Present Illness Pt s/p revision of L TKR; pt with hx of DM, CAD, and bilat TKR    PT Comments    POD # 1 am session Assisted OOB.  Demonstrated and instructed pt how to use belt to self assist LE as a leg lifter Assisted with amb.  General Gait Details: <25% VC's on safety with turns.  Tolerated an increased distance.  Assisted back to bed per pt request.     Follow Up Recommendations  Follow surgeon's recommendation for DC plan and follow-up therapies     Equipment Recommendations  None recommended by PT    Recommendations for Other Services       Precautions / Restrictions Precautions Precautions: Knee;Fall Precaution Comments: reviewed no pillow under knee Restrictions Weight Bearing Restrictions: No Other Position/Activity Restrictions: WBAT    Mobility  Bed Mobility Overal bed mobility: Needs Assistance Bed Mobility: Supine to Sit;Sit to Supine     Supine to sit: Min guard;Supervision Sit to supine: Min guard;Supervision   General bed mobility comments: demonstarted and instructed pt how to use a belt to self assist LE off/on bed  Transfers Overall transfer level: Needs assistance Equipment used: Rolling walker (2 wheeled) Transfers: Sit to/from [employer] Sit to Stand: Min guard;Supervision Stand pivot transfers: Supervision;Min guard       General transfer comment: <25 % cues for LE management and use of UEs to self assist  Ambulation/Gait Ambulation/Gait assistance: Min guard;Min assist Gait Distance (Feet): 45 Feet Assistive device: Rolling walker (2 wheeled) Gait Pattern/deviations: Step-to pattern;Step-through pattern;Decreased stance time - right Gait velocity: decreased   General Gait Details: <25% VC's on safety with turns.  Tolerated an increased distance   Metallurgist Rankin (Stroke Patients Only)       Balance                                            Cognition Arousal/Alertness: Awake/alert Behavior During Therapy: WFL for tasks assessed/performed Overall Cognitive Status: Within Functional Limits for tasks assessed                                        Exercises      General Comments        Pertinent Vitals/Pain Pain Assessment: 0-10 Pain Score: 5  Pain Location: L knee Pain Descriptors / Indicators: Aching;Sore;Throbbing Pain Intervention(s): Monitored during session;Repositioned;Premedicated before session;Ice applied    Home Living                      Prior Function            PT Goals (current goals can now be found in the care plan section) Progress towards PT goals: Progressing toward goals    Frequency    7X/week      PT Plan Current plan remains appropriate    Co-evaluation              AM-PAC PT "6 Clicks" Mobility   Outcome Measure  Help needed turning from your back to your side while  in a flat bed without using bedrails?: A Little Help needed moving from lying on your back to sitting on the side of a flat bed without using bedrails?: A Little Help needed moving to and from a bed to a chair (including a wheelchair)?: A Little Help needed standing up from a chair using your arms (e.g., wheelchair or bedside chair)?: A Little Help needed to walk in hospital room?: A Little Help needed climbing 3-5 steps with a railing? : A Lot 6 Click Score: 17    End of Session Equipment Utilized During Treatment: Gait belt Activity Tolerance: Patient limited by fatigue;Other (comment);Patient limited by pain Patient left: in bed;with call bell/phone within reach;with bed alarm set Nurse Communication: Mobility status PT Visit Diagnosis: Difficulty in walking, not elsewhere classified (R26.2);Pain Pain - Right/Left: Left Pain  - part of body: Knee     Time: 1225-1250 PT Time Calculation (min) (ACUTE ONLY): 25 min  Charges:  $Gait Training: 8-22 mins $Therapeutic Activity: 8-22 mins                     Felecia Shelling  PTA Acute  Rehabilitation Services Pager      [phone] Office      [phone]

## 2019-01-10 NOTE — Progress Notes (Signed)
Subjective:  Patient reports pain as mild to moderate.  Denies N/V/CP/SOB. No c/o.  Objective:   VITALS:   Vitals:   01/09/19 1600 01/09/19 2110 01/10/19 0140 01/10/19 0515  BP: 127/64 (!) 143/69 131/77 (!) 141/69  Pulse: (!) 104 87 99 90  Resp: 16 16 18 18   Temp: 97.9 F (36.6 C) 97.7 F (36.5 C) 97.8 F (36.6 C) 98.3 F (36.8 C)  TempSrc: Oral Oral Oral   SpO2: 96% 98% 96% 97%  Weight:      Height:        HV ~350 in 24 hrs  NAD ABD soft Sensation intact distally Intact pulses distally Dorsiflexion/Plantar flexion intact Incision: dressing C/D/I Compartment soft HV ss  Lab Results  Component Value Date   WBC 9.1 01/10/2019   HGB 9.6 (L) 01/10/2019   HCT 28.3 (L) 01/10/2019   MCV 91.6 01/10/2019   PLT 252 01/10/2019   BMET    Component Value Date/Time   NA 137 01/10/2019 0400   NA 140 12/03/2018 1205   K 4.5 01/10/2019 0400   CL 112 (H) 01/10/2019 0400   CO2 20 (L) 01/10/2019 0400   GLUCOSE 211 (H) 01/10/2019 0400   GLUCOSE 349 (H) 10/06/2015 1436   BUN 17 01/10/2019 0400   BUN 20 12/03/2018 1205   CREATININE 0.70 01/10/2019 0400   CREATININE 1.02 (H) 09/13/2018 1451   CALCIUM 8.2 (L) 01/10/2019 0400   GFRNONAA >60 01/10/2019 0400   GFRNONAA 93 06/14/2017 0814   GFRAA >60 01/10/2019 0400   GFRAA 108 06/14/2017 0814      Recent Results (from the past 240 hour(s))  Surgical pcr screen     Status: None   Collection Time: 01/02/19 12:39 PM  Result Value Ref Range Status   MRSA, PCR NEGATIVE NEGATIVE Final   Staphylococcus aureus NEGATIVE NEGATIVE Final    Comment: (NOTE) The Xpert SA Assay (FDA approved for NASAL specimens in patients 69 years of age and older), is one component of a comprehensive surveillance program. It is not intended to diagnose infection nor to guide or monitor treatment. Performed at [facility], [place] [address] [place], [place] [address]   Novel Coronavirus, NAA (hospital order; send-out to  ref lab)     Status: None   Collection Time: 01/06/19 10:37 AM  Result Value Ref Range Status   SARS-CoV-2, NAA NOT DETECTED NOT DETECTED Final    Comment: (NOTE) This test was developed and its performance characteristics determined by [employer]. This test has not been FDA cleared or approved. This test has been authorized by FDA under an Emergency Use Authorization (EUA). This test is only authorized for the duration of time the declaration that circumstances exist justifying the authorization of the emergency use of in vitro diagnostic tests for detection of SARS-CoV-2 virus and/or diagnosis of COVID-19 infection under section 564(b)(1) of the Act, 21 U.S.C. 967ELF-8(B)(0), unless the authorization is terminated or revoked sooner. When diagnostic testing is negative, the possibility of a false negative result should be considered in the context of a patient's recent exposures and the presence of clinical signs and symptoms consistent with COVID-19. An individual without symptoms of COVID-19 and who is not shedding SARS-CoV-2 virus would expect to have a negative (not detected) result in this assay. Performed  At: [facility] [address] [place], [place] [id number] Rush Farmer MD ID:[id number]    [place]  Final    Comment: Performed at [facility]  Lab, [address] [place], [place] [address]  Aerobic/Anaerobic Culture (surgical/deep wound)     Status: None (Preliminary result)   Collection Time: 01/09/19  8:46 AM  Result Value Ref Range Status   Specimen Description KNEE LEFT  Final   Special Requests VANCOMYCIN  Final   Gram Stain   Final    RARE WBC PRESENT,BOTH PMN AND MONONUCLEAR NO ORGANISMS SEEN Gram Stain Report Called to,Read Back By and Verified With: B.MATHEWS RN AT 7062 ON 01/09/2019 BY S.VANHOORNE Performed at [facility], [place] [address] [place], [place] [address]    Culture PENDING   Incomplete   Report Status PENDING  Incomplete        Assessment/Plan: 1 Day Post-Op   Principal Problem:   Failed total knee, left, initial encounter ([place])   WBAT with walker DVT ppx: apixaban, SCDs, TEDS PO pain control PT/OT DM2: strict glucose control Intraop culture: NGTD, follow Dispo: D/C HV drain tomorrow, d/c home tomorrow with Rollen Sox Makeba Delcastillo 01/10/2019, 9:11 AM   Rod Can, MD Cell: [phone] [facility] is now [facility]  [place] [address] [place], [place], [place] [address] Phone: [phone] [email or web address] Facebook  [employer]

## 2019-01-11 LAB — CBC
HCT: 30.5 % — ABNORMAL LOW (ref 36.0–46.0)
Hemoglobin: 10.6 g/dL — ABNORMAL LOW (ref 12.0–15.0)
MCH: 31.5 pg (ref 26.0–34.0)
MCHC: 34.8 g/dL (ref 30.0–36.0)
MCV: 90.5 fL (ref 80.0–100.0)
Platelets: 288 10*3/uL (ref 150–400)
RBC: 3.37 MIL/uL — ABNORMAL LOW (ref 3.87–5.11)
RDW: 13.2 % (ref 11.5–15.5)
WBC: 8.9 10*3/uL (ref 4.0–10.5)
nRBC: 0 % (ref 0.0–0.2)

## 2019-01-11 LAB — GLUCOSE, CAPILLARY
Glucose-Capillary: 127 mg/dL — ABNORMAL HIGH (ref 70–99)
Glucose-Capillary: 168 mg/dL — ABNORMAL HIGH (ref 70–99)

## 2019-01-11 NOTE — Progress Notes (Signed)
Orthopedics Progress Note  Subjective: Patient feeling better today and has been up to the bathroom several times without a problem.  She is ready to go home.  Objective:  Vitals:   01/10/19 2111 01/11/19 0537  BP: (!) 154/81 (!) 165/80  Pulse: (!) 106 (!) 104  Resp: 18 18  Temp: 98.2 F (36.8 C) (!) 97.4 F (36.3 C)  SpO2: 100% 95%    General: Awake and alert  Musculoskeletal: Left knee Aquacel dressing in place. Drain pulled. Leg re-wrapped. Minimal swelling distally and minimal pain with AROM. Neg Homans. Neurovascularly intact  Lab Results  Component Value Date   WBC 8.9 01/11/2019   HGB 10.6 (L) 01/11/2019   HCT 30.5 (L) 01/11/2019   MCV 90.5 01/11/2019   PLT 288 01/11/2019       Component Value Date/Time   NA 137 01/10/2019 0400   NA 140 12/03/2018 1205   K 4.5 01/10/2019 0400   CL 112 (H) 01/10/2019 0400   CO2 20 (L) 01/10/2019 0400   GLUCOSE 211 (H) 01/10/2019 0400   GLUCOSE 349 (H) 10/06/2015 1436   BUN 17 01/10/2019 0400   BUN 20 12/03/2018 1205   CREATININE 0.70 01/10/2019 0400   CREATININE 1.02 (H) 09/13/2018 1451   CALCIUM 8.2 (L) 01/10/2019 0400   GFRNONAA >60 01/10/2019 0400   GFRNONAA 93 06/14/2017 0814   GFRAA >60 01/10/2019 0400   GFRAA 108 06/14/2017 0814    Lab Results  Component Value Date   INR 1.07 03/08/2015    Assessment/Plan: POD #2 s/p Procedure(s): TOTAL KNEE REVISION Drain out. Discharge to home per Dr Lyla Glassing. Patient requests cancelling narcotic prescription as she get her pain meds from another MD. Follow up with Dr Lyla Glassing as an outpatient  Doran Heater. Veverly Fells, MD 01/11/2019 10:16 AM

## 2019-01-11 NOTE — Discharge Summary (Signed)
Orthopedic Discharge Summary        Physician Discharge Summary  Patient ID: Stephanie Hammond MRN: [id number] DOB/AGE: [birth year] 68 y.o.  Admit date: 01/09/2019 Discharge date: 01/11/2019   Procedures:  Procedure(s) (LRB): TOTAL KNEE REVISION (Left)  Attending Physician:  Dr. Malon Kindle  Admission Diagnoses:   Left knee pain, s/p TKR  Discharge Diagnoses:  same   Past Medical History:  Diagnosis Date  . Anemia   . Blood transfusion 2009   after GI bleed  . CAD (coronary artery disease)   . Diabetes mellitus    2  . Diverticulitis   . Diverticulosis   . Fear of local anesthetic    01-02-2019, patient states with last surgery in 2016 , she refused to have epidural before she was sedated d/t to extreme aversion to needles near her back    . GERD (gastroesophageal reflux disease)   . Gout   . Headache    occ if sugar is  too hi or low   . Heart murmur    "i was told this years and years and years ago "  . History of GI diverticular bleed 08/2004  . Hypertension   . IBS (irritable bowel syndrome)   . Osteoarthritis   . Tachycardia    "it stays like that for years between 119 and 121 and it doesnt bother me  none "     PCP: Salley Scarlet, MD   Discharged Condition: good  Hospital Course:  Patient underwent the above stated procedure on 01/09/2019. Patient tolerated the procedure well and brought to the recovery room in good condition and subsequently to the floor. Patient had an uncomplicated hospital course and was stable for discharge.   Disposition: Discharge disposition: 01-Home or Self Care      with follow up in 2 weeks   Follow-up Information    Swinteck, Arlys John, MD. Schedule an appointment as soon as possible for a visit in 2 weeks.   Specialty:  Orthopedic Surgery Why:  For wound re-check Contact information: [address] [place] [address] [phone]           Discharge Instructions    Call MD / Call 911    Complete by:  As directed    If you experience chest pain or shortness of breath, CALL 911 and be transported to the hospital emergency room.  If you develope a fever above 101 F, pus (white drainage) or increased drainage or redness at the wound, or calf pain, call your surgeon's office.   Constipation Prevention   Complete by:  As directed    Drink plenty of fluids.  Prune juice may be helpful.  You may use a stool softener, such as Colace (over the counter) 100 mg twice a day.  Use MiraLax (over the counter) for constipation as needed.   Diet - low sodium heart healthy   Complete by:  As directed    Increase activity slowly as tolerated   Complete by:  As directed       Allergies as of 01/11/2019      Reactions   Ace Inhibitors Cough   Bentyl [dicyclomine Hcl] Other (See Comments)   Blurry vision   Penicillins Itching, Swelling   Whelps Did it involve swelling of the face/tongue/throat, SOB, or low BP? No Did it involve sudden or severe rash/hives, skin peeling, or any reaction on the inside of your mouth or nose? No Did you need to seek medical attention  at a hospital or doctor's office? No When did it last happen?20 years If all above answers are "NO", may proceed with cephalosporin use.      Medication List    STOP taking these medications   meloxicam 7.5 MG tablet Commonly known as:  MOBIC   oxyCODONE-acetaminophen 7.5-325 MG tablet Commonly known as:  Percocet     TAKE these medications   acetaminophen 325 MG tablet Commonly known as:  TYLENOL Take 1-2 tablets (325-650 mg total) by mouth every 6 (six) hours as needed for mild pain (pain score 1-3 or temp > 100.5).   allopurinol 300 MG tablet Commonly known as:  ZYLOPRIM TAKE 1 TABLET BY MOUTH EVERY DAY   amLODipine 10 MG tablet Commonly known as:  NORVASC TAKE 1 TABLET BY MOUTH EVERY DAY   apixaban 2.5 MG Tabs tablet Commonly known as:  ELIQUIS Take 1 tablet (2.5 mg total) by mouth every 12 (twelve)  hours.   atorvastatin 20 MG tablet Commonly known as:  LIPITOR TAKE 1 TABLET BY MOUTH EVERY DAY   B-D UF III MINI PEN NEEDLES 31G X 5 MM Misc Generic drug:  Insulin Pen Needle INJECT 1 EACH INTO THE SKIN DAILY. USE AS DIRECTED TO INJECT INSULIN DAILY.   docusate sodium 100 MG capsule Commonly known as:  COLACE Take 1 capsule (100 mg total) by mouth 2 (two) times daily.   glucose blood test strip Commonly known as:  ONE TOUCH ULTRA TEST USE AS DIRECTED TO TEST THREE TIMES DAILY   insulin degludec 100 UNIT/ML Sopn FlexTouch Pen Commonly known as:  Evaristo Bury FlexTouch Inject 0.12 mLs (12 Units total) into the skin daily at 10 pm.   insulin glargine 100 UNIT/ML injection Commonly known as:  LANTUS Inject 12 Units into the skin at bedtime. Uses interchangeably with Evaristo Bury if she cant get the Lantus   Lancets Misc Use as directed.   losartan 100 MG tablet Commonly known as:  COZAAR TAKE 1 TABLET BY MOUTH EVERY DAY   metFORMIN 500 MG 24 hr tablet Commonly known as:  GLUCOPHAGE-XR TAKE 2 TABLETS(1000 MG) BY MOUTH DAILY WITH BREAKFAST   omeprazole 20 MG capsule Commonly known as:  PRILOSEC Take 1 capsule (20 mg total) by mouth daily. TAKE 1 CAPSULE BY MOUTH EVERY DAY IN THE MORNING   ondansetron 4 MG tablet Commonly known as:  ZOFRAN Take 1 tablet (4 mg total) by mouth every 6 (six) hours as needed for nausea.   polyethylene glycol powder 17 GM/SCOOP powder Commonly known as:  GLYCOLAX/MIRALAX 1 scoop daily or as needed What changed:    how much to take  how to take this  when to take this  additional instructions   senna 8.6 MG Tabs tablet Commonly known as:  SENOKOT Take 1 tablet (8.6 mg total) by mouth 2 (two) times daily.         Signed: Verlee Rossetti 01/11/2019, 10:24 AM  [facility] is now [employer] [address], [address], [place], [place] [address] Phone: [phone] Facebook  Instagram  [employer]

## 2019-01-11 NOTE — Progress Notes (Signed)
Pt stable at this time. Pt had no questions on d/c instructions given. No changes to note overall. Pt awaiting family arrival. Pt has all dme needed already.

## 2019-01-11 NOTE — Plan of Care (Signed)
  Problem: Coping: Goal: Level of anxiety will decrease Outcome: Progressing   Problem: Physical Regulation: Goal: Postoperative complications will be avoided or minimized Outcome: Progressing   Problem: Education: Goal: Verbalization of understanding the information provided will improve Outcome: Progressing   Problem: Activity: Goal: Ability to avoid complications of mobility impairment will improve Outcome: Progressing

## 2019-01-11 NOTE — Progress Notes (Signed)
Physical Therapy Treatment Patient Details Name: Stephanie Hammond MRN: [id number] DOB: 1951/06/19 Today's Date: 01/11/2019    History of Present Illness Pt s/p revision of L TKR; pt with hx of DM, CAD, and bilat TKR    PT Comments    Ms. Coe progressed well with mobility today. Patient received semi-supine in bed and she was eager to participate in therapy in hopes of being discharged. She was mod I with bed mobility using bed rail with HOB elevated. Patient now requires supervision for sit to stand transfers and gait with RW and demo'd improvemed tolerance to gait with slight increase in pain from 6/10 to 7/10 following 80 feet amb and stair training. Patient requried ces for step sequencing and ascended/descended 5 stairs with 2 hand rails and supervision. She reports having hand rails on he front steps and that she will be able to use them to return home. Pt has demonstrated safe level of functional mobility and has 2 adult sons to guard her and help manage RW when returning home. She is safe to be discharged per mobility and anticipate discharge once medically ready and pain management is at adequate level.    Follow Up Recommendations  Follow surgeon's recommendation for DC plan and follow-up therapies     Equipment Recommendations  None recommended by PT    Recommendations for Other Services       Precautions / Restrictions Precautions Precautions: Knee;Fall Restrictions Weight Bearing Restrictions: No Other Position/Activity Restrictions: WBAT    Mobility  Bed Mobility Overal bed mobility: Needs Assistance Bed Mobility: Supine to Sit     Supine to sit: Supervision;Modified independent (Device/Increase time)     General bed mobility comments: pt using bed rails with HOB elevated, no cues required and pt managing LE mobility independently  Transfers Overall transfer level: Needs assistance Equipment used: Rolling walker (2 wheeled) Transfers: Sit to/from  [employer] Sit to Stand: Supervision Stand pivot transfers: Supervision       General transfer comment: pt with good carryover form prior sessions, requried 1x verbal cues for hand plancement and RW position prior to stand, no other cues needed  Ambulation/Gait Ambulation/Gait assistance: Supervision Gait Distance (Feet): 80 Feet Assistive device: Rolling walker (2 wheeled) Gait Pattern/deviations: Decreased stance time - right;Step-to pattern Gait velocity: decreased   General Gait Details: pt with good carryover with gait using RW, no cues needed to maintain safe proximity to walker, pt toelrated increased distance with no increase in pain   Stairs Stairs: Yes Stairs assistance: Supervision Stair Management: Two rails;Step to pattern;Forwards Number of Stairs: 5 General stair comments: cues for sequencing up with Rt/down with Lt to ascend and descend safely, pt has railings at front of house and reports she can go in that way   Wheelchair Mobility    Modified Rankin (Stroke Patients Only)       Balance Overall balance assessment: Needs assistance Sitting-balance support: No upper extremity supported;Feet supported Sitting balance-Leahy Scale: Good     Standing balance support: Bilateral upper extremity supported;During functional activity;No upper extremity supported Standing balance-Leahy Scale: Fair Standing balance comment: pt adjusted house coat in standing without UE support on RW and maintain static balance, no weight shift         Cognition Arousal/Alertness: Awake/alert Behavior During Therapy: WFL for tasks assessed/performed Overall Cognitive Status: Within Functional Limits for tasks assessed         Exercises Total Joint Exercises Quad Sets: AROM;Left;5 reps(5 sec holds)    General  Comments        Pertinent Vitals/Pain Pain Assessment: 0-10 Pain Score: 6  Pain Location: L knee Pain Descriptors / Indicators:  Aching;Sore Pain Intervention(s): Limited activity within patient's tolerance;Monitored during session    Home Living   Prior Function    PT Goals (current goals can now be found in the care plan section) Acute Rehab PT Goals Patient Stated Goal: Regain IND PT Goal Formulation: With patient Time For Goal Achievement: 01/23/19 Potential to Achieve Goals: Good Progress towards PT goals: Progressing toward goals    Frequency    7X/week      PT Plan Current plan remains appropriate       AM-PAC PT "6 Clicks" Mobility   Outcome Measure  Help needed turning from your back to your side while in a flat bed without using bedrails?: A Little Help needed moving from lying on your back to sitting on the side of a flat bed without using bedrails?: A Little Help needed moving to and from a bed to a chair (including a wheelchair)?: A Little Help needed standing up from a chair using your arms (e.g., wheelchair or bedside chair)?: A Little Help needed to walk in hospital room?: A Little Help needed climbing 3-5 steps with a railing? : A Little 6 Click Score: 18    End of Session Equipment Utilized During Treatment: Gait belt Activity Tolerance: Patient tolerated treatment well Patient left: with call bell/phone within reach;in chair Nurse Communication: Mobility status PT Visit Diagnosis: Difficulty in walking, not elsewhere classified (R26.2);Pain Pain - Right/Left: Left Pain - part of body: Knee     Time: 1012-1037 PT Time Calculation (min) (ACUTE ONLY): 25 min  Charges:  $Gait Training: 23-37 mins                     Valentino Saxon, PT, DPT, [facility] Physical Therapist with [facility]  01/11/2019 12:27 PM

## 2019-01-11 NOTE — TOC Initial Note (Signed)
Transition of Care [facility]) - Initial/Assessment Note    Patient Details  Name: Stephanie Hammond MRN: [id number] Date of Birth: [birth year]  Transition of Care [facility]) CM/SW Contact:    Armanda Heritage, RN Phone Number: 01/11/2019, 12:17 PM  Clinical Narrative:      CM spoke with patient at bedside. Patient reports she has a rolling walker and 3-in-1 at home. Patient scheduled for OPPT. Reports spouse and daugter in law are available to help her at home as needed.               Expected Discharge Plan: Home/Self Care Barriers to Discharge: Continued Medical Work up   Patient Goals and CMS Choice Patient states their goals for this hospitalization and ongoing recovery are:: to get better      Expected Discharge Plan and Services Expected Discharge Plan: Home/Self Care   Discharge Planning Services: CM Consult   Living arrangements for the past 2 months: Single Family Home Expected Discharge Date: 01/11/19               DME Arranged: N/A DME Agency: NA       HH Arranged: NA HH Agency: NA        Prior Living Arrangements/Services Living arrangements for the past 2 months: Single Family Home Lives with:: Spouse Patient language and need for interpreter reviewed:: Yes Do you feel safe going back to the place where you live?: Yes      Need for Family Participation in Patient Care: Yes (Comment) Care giver support system in place?: Yes (comment)   Criminal Activity/Legal Involvement Pertinent to Current Situation/Hospitalization: No - Comment as needed  Activities of Daily Living Home Assistive Devices/Equipment: CBG Meter, Eyeglasses ADL Screening (condition at time of admission) Patient's cognitive ability adequate to safely complete daily activities?: Yes Is the patient deaf or have difficulty hearing?: No Does the patient have difficulty seeing, even when wearing glasses/contacts?: No Does the patient have difficulty concentrating, remembering, or making  decisions?: No Patient able to express need for assistance with ADLs?: Yes Does the patient have difficulty dressing or bathing?: No Independently performs ADLs?: Yes (appropriate for developmental age) Does the patient have difficulty walking or climbing stairs?: No Weakness of Legs: None Weakness of Arms/Hands: None  Permission Sought/Granted                  Emotional Assessment Appearance:: Appears stated age Attitude/Demeanor/Rapport: Engaged Affect (typically observed): Accepting Orientation: : Oriented to Self, Oriented to Place, Oriented to Situation, Oriented to  Time   Psych Involvement: No (comment)  Admission diagnosis:  Failed left total knee arthroplasty Patient Active Problem List   Diagnosis Date Noted  . Failed total knee, left, initial encounter (HCC) 01/09/2019  . Anemia 12/31/2015  . Arthritis of knee, degenerative 03/12/2015  . Arthritis of left knee   . Gout 09/07/2014  . Back pain with left-sided radiculopathy 09/07/2014  . Type 2 diabetes mellitus with diabetic neuropathy (HCC) 02/14/2014  . Hyperlipidemia 03/12/2013  . IBS (irritable bowel syndrome) 01/09/2013  . Diabetic neuropathy (HCC) 11/14/2012  . Diarrhea 09/13/2012  . Major depression, single episode 09/13/2012  . Essential hypertension, benign 06/09/2012  . DDD (degenerative disc disease), lumbar 03/28/2012  . Chronic knee pain 03/28/2012  . Obesity 03/28/2012  . Tobacco user 03/28/2012  . CAD (coronary artery disease) 03/28/2012   PCP:  Salley Scarlet, MD Pharmacy:   [facility] [phone] - [place], [place] - [address] [place]  [place] [address] Phone: [phone] Fax: [phone]     Social Determinants of Health (SDOH) Interventions    Readmission Risk Interventions No flowsheet data found.

## 2019-01-13 ENCOUNTER — Other Ambulatory Visit: Payer: Self-pay | Admitting: *Deleted

## 2019-01-13 ENCOUNTER — Ambulatory Visit: Payer: Medicare HMO | Admitting: Physical Therapy

## 2019-01-13 MED ORDER — OXYCODONE-ACETAMINOPHEN 7.5-325 MG PO TABS: 1.0000 | ORAL_TABLET | Freq: Four times a day (QID) | ORAL | 0 refills | Status: DC | PRN

## 2019-01-13 NOTE — Telephone Encounter (Signed)
Received call from patient.   Requested refill on Oxycodone/APAP.   Ok to refill??  Last office visit 10/09/2018.  Last refill 12/12/2018.  Of note, prescription was sent to pharmacy for Oxycodone/ APAP 5/325 mg from ortho after knee replacement, but she has not received from pharmacy.

## 2019-01-14 ENCOUNTER — Encounter (HOSPITAL_COMMUNITY): Payer: Self-pay | Admitting: Orthopedic Surgery

## 2019-01-14 LAB — AEROBIC/ANAEROBIC CULTURE W GRAM STAIN (SURGICAL/DEEP WOUND): Culture: NO GROWTH

## 2019-01-15 ENCOUNTER — Ambulatory Visit: Payer: Medicare HMO | Attending: Orthopedic Surgery | Admitting: Physical Therapy

## 2019-01-15 ENCOUNTER — Other Ambulatory Visit: Payer: Self-pay

## 2019-01-15 ENCOUNTER — Encounter: Payer: Self-pay | Admitting: Physical Therapy

## 2019-01-15 DIAGNOSIS — R293 Abnormal posture: Secondary | ICD-10-CM | POA: Diagnosis not present

## 2019-01-15 DIAGNOSIS — R262 Difficulty in walking, not elsewhere classified: Secondary | ICD-10-CM | POA: Diagnosis not present

## 2019-01-15 DIAGNOSIS — M25662 Stiffness of left knee, not elsewhere classified: Secondary | ICD-10-CM

## 2019-01-15 DIAGNOSIS — M25562 Pain in left knee: Secondary | ICD-10-CM | POA: Insufficient documentation

## 2019-01-15 NOTE — Therapy (Signed)
[facility] [address] [place], [place], [address] Phone: [phone]   Fax:  [phone]  Physical Therapy Evaluation  Patient Details  Name: Stephanie Hammond MRN: [id number] Date of Birth: 1950/11/24 Referring Provider (PT): Samson Frederic, MD   Encounter Date: 01/15/2019  PT End of Session - 01/15/19 1819    Visit Number  1    Number of Visits  18    Date for PT Re-Evaluation  03/05/19    Authorization Type  FOTO; progress note every 10th visit; KX modifier after 15th visit    PT Start Time  0915    PT Stop Time  1002    PT Time Calculation (min)  47 min    Equipment Utilized During Treatment  Other (comment)   rolling walker   Activity Tolerance  Patient tolerated treatment well    Behavior During Therapy  [facility] for tasks assessed/performed       Past Medical History:  Diagnosis Date  . Anemia   . Blood transfusion 2009   after GI bleed  . CAD (coronary artery disease)   . Diabetes mellitus    2  . Diverticulitis   . Diverticulosis   . Fear of local anesthetic    01-02-2019, patient states with last surgery in 2016 , she refused to have epidural before she was sedated d/t to extreme aversion to needles near her back    . GERD (gastroesophageal reflux disease)   . Gout   . Headache    occ if sugar is  too hi or low   . Heart murmur    "i was told this years and years and years ago "  . History of GI diverticular bleed 08/2004  . Hypertension   . IBS (irritable bowel syndrome)   . Osteoarthritis   . Tachycardia    "it stays like that for years between 119 and 121 and it doesnt bother me  none "     Past Surgical History:  Procedure Laterality Date  . CHOLECYSTECTOMY  2003  . COLONOSCOPY  2000   Dr. Russella Dar: marked diverticulosis, difficult procedure due to adhesions, polyps benign  . COLONOSCOPY  2006   Dr. Marina Goodell: severe pandiverticulosis  . COLONOSCOPY N/A 02/03/2013   SLF: 4 COLORECTAL POLYPS REMOVED/Moderate  diverticulosis throughout the entire examined colon/Small internal hemorrhoids  . JOINT REPLACEMENT Right 2009   Knee Replacement Right   . PTCA     over 10 years ago , unsure of exact year , does recall that no stents were placed   . REPLACEMENT TOTAL KNEE     right   . TOTAL KNEE ARTHROPLASTY Left 03/12/2015   Procedure: LEFT TOTAL KNEE REPLACEMENT;  Surgeon: Vickki Hearing, MD;  Location: [facility];  Service: Orthopedics;  Laterality: Left;  . TOTAL KNEE REVISION Left 01/09/2019   Procedure: TOTAL KNEE REVISION;  Surgeon: Samson Frederic, MD;  Location: [facility];  Service: Orthopedics;  Laterality: Left;  [name] VAGINAL HYSTERECTOMY      There were no vitals filed for this visit.   Subjective Assessment - 01/15/19 1255    Subjective  COVID-19 screening performed prior to patient entering the building. Patient arrives to physical therapy with reports of left knee pain, left knee stiffness, and difficulty walking due to a left total knee arthroplasty revision on 01/09/2019. Patient reports difficulties with ADLs secondary to pain but patient is able to perform without assistance. Patient ambulates within the home with a walker and has only  been performing light ROM exercises. Patient reports pain at worst is "15/10" and pain at best is 8/10. Patient's goals are to decrease pain and walk without a walker.    Pertinent History  left TKA revision 01/09/2019; R TKA 2009; lumbar DDD, HTN, DM    Limitations  Sitting;Standing;Walking;House hold activities    How long can you sit comfortably?  short periods before pain starts    How long can you stand comfortably?  short periods    How long can you walk comfortably?  household ambulation    Diagnostic tests  n/a    Patient Stated Goals  Get out of pain walk without an AD    Currently in Pain?  Yes    Pain Score  8     Pain Location  Knee    Pain Orientation  Left    Pain Descriptors / Indicators  Aching;Sore;Tightness;Constant    Pain Type  Surgical pain     Pain Onset  1 to 4 weeks ago    Pain Frequency  Constant    Aggravating Factors   movement    Pain Relieving Factors  pain medication, resting    Effect of Pain on Daily Activities  difficulties with walking and all ADLs due to pain         [facility] PT Assessment - 01/15/19 0001      Assessment   Medical Diagnosis  Left TKA Revision    Referring Provider (PT)  Samson Frederic, MD    Onset Date/Surgical Date  01/09/19    Next MD Visit  January 21, 2019    Prior Therapy  Yes      Precautions   Precautions  Other (comment)    Precaution Comments  no ultrasound      Restrictions   Weight Bearing Restrictions  No      Balance Screen   Has the patient fallen in the past 6 months  No    Has the patient had a decrease in activity level because of a fear of falling?   No    Is the patient reluctant to leave their home because of a fear of falling?   No      Home Public house manager residence    Home Access  Stairs to enter    Entrance Stairs-Number of Steps  2    Entrance Stairs-Rails  None      Prior Function   Level of Independence  Independent with basic ADLs      Observation/Other Assessments   Observations  Ace bandage donned and aquacell dressing donned over incision    Focus on Therapeutic Outcomes (FOTO)   78% limited      Observation/Other Assessments-Edema    Edema  Circumferential      Circumferential Edema   Circumferential - Right  41 cm at mid patella    Circumferential - Left   43 cm at mid patella      ROM / Strength   AROM / PROM / Strength  AROM;PROM      AROM   Overall AROM   Deficits;Due to pain    AROM Assessment Site  Knee    Right/Left Knee  Left    Left Knee Extension  13    Left Knee Flexion  64      PROM   Overall PROM   Deficits;Due to pain    PROM Assessment Site  Knee    Right/Left Knee  Left  Left Knee Extension  10    Left Knee Flexion  72      Palpation   Patella mobility  decreased left knee patella  mobilization in all planes    Palpation comment  very tender to palpation to left knee globally      Transfers   Comments  required use of UEs to navigate left LE on/off plinth      Ambulation/Gait   Assistive device  Rolling walker    Gait Pattern  Step-to pattern;Decreased step length - right;Decreased stance time - left;Decreased stride length;Decreased hip/knee flexion - left;Left foot flat;Left flexed knee in stance;Antalgic;Trunk flexed                Objective measurements completed on examination: See above findings.                PT Short Term Goals - 01/15/19 1830      PT SHORT TERM GOAL #1   Title  Patient will be independent with HEP    Time  3    Period  Weeks    Status  New      PT SHORT TERM GOAL #2   Title  Patient will demonstrate 90+ degrees of left knee flexion AROM to improve ability to perform ADLs    Time  6    Period  Weeks    Status  New      PT SHORT TERM GOAL #3   Title  Patient will demonstrate 5 degrees of left knee extension AROM to improve gait mechanics.    Time  6    Period  Weeks    Status  New        PT Long Term Goals - 01/15/19 1833      PT LONG TERM GOAL #1   Title  Patient will be independent with advanced HEP    Time  6    Period  Weeks    Status  New      PT LONG TERM GOAL #2   Title  Patient will demonstrate 115+ degrees of left knee flexion AROM to improve ability to perform functional tasks.    Time  6    Period  Weeks    Status  New      PT LONG TERM GOAL #3   Title  Patient will demonstrate 3 degrees or less of left knee extension AROM to improve gait mechanics.    Time  6    Period  Weeks    Status  New      PT LONG TERM GOAL #4   Title  Patient will report ability to negotiate 2 steps with reciprocating pattern and no railing to safely enter and exit her home.    Time  6    Period  Weeks    Status  New      PT LONG TERM GOAL #5   Title  Patient will report ability to perform ADLs with  left knee pain less than or equal to 4/10.    Time  6    Period  Weeks    Status  New             Plan - 01/15/19 1820    Clinical Impression Statement  Patient is a 68 year old female who presents to physical therapy with left knee pain, decreased left knee AROM, and difficulty walking secondary to a left total knee revision performed on 01/09/2019. Patient observed with an an  ace bandage wrapped around her surgical leg. Patient ambulates with a rolling walker with a step to gait pattern, decreased left knee flexion during swing, and decreased left stance time. Patient educated on importance of gaining extension for proper gait mechanics and educated the importance of performing HEP to optimize physical therapy benefits. Patient reported understanding. Patient would benefit from skilled physical therapy to address deficits and address patient's goals.     Personal Factors and Comorbidities  Comorbidity 2    Comorbidities  HTN, DM    Examination-Activity Limitations  Stairs;Stand;Sit    Stability/Clinical Decision Making  Stable/Uncomplicated    Clinical Decision Making  Low    Rehab Potential  Good    PT Frequency  3x / week    PT Duration  6 weeks    PT Treatment/Interventions  ADLs/Self Care Home Management;Cryotherapy;Moist Heat;Therapeutic activities;Therapeutic exercise;Patient/family education;Manual techniques;Electrical Stimulation;Balance training;Neuromuscular re-education;Vasopneumatic Device;Passive range of motion;Gait training;Stair training    PT Next Visit Plan  Nustep, emphasis on knee extension, left knee PROM, Modalities PRN for pain relief    PT Home Exercise Plan  see patient education section    Consulted and Agree with Plan of Care  Patient       Patient will benefit from skilled therapeutic intervention in order to improve the following deficits and impairments:  Pain, Decreased range of motion, Decreased strength, Decreased activity tolerance, Difficulty  walking  Visit Diagnosis: Acute pain of left knee - Plan: PT plan of care cert/re-cert  Stiffness of left knee, not elsewhere classified - Plan: PT plan of care cert/re-cert  Difficulty in walking, not elsewhere classified - Plan: PT plan of care cert/re-cert     Problem List Patient Active Problem List   Diagnosis Date Noted  . Failed total knee, left, initial encounter (HCC) 01/09/2019  . Anemia 12/31/2015  . Arthritis of knee, degenerative 03/12/2015  . Arthritis of left knee   . Gout 09/07/2014  . Back pain with left-sided radiculopathy 09/07/2014  . Type 2 diabetes mellitus with diabetic neuropathy (HCC) 02/14/2014  . Hyperlipidemia 03/12/2013  . IBS (irritable bowel syndrome) 01/09/2013  . Diabetic neuropathy (HCC) 11/14/2012  . Diarrhea 09/13/2012  . Major depression, single episode 09/13/2012  . Essential hypertension, benign 06/09/2012  . DDD (degenerative disc disease), lumbar 03/28/2012  . Chronic knee pain 03/28/2012  . Obesity 03/28/2012  . Tobacco user 03/28/2012  . CAD (coronary artery disease) 03/28/2012   Guss Bunde, PT, DPT 01/15/2019, 7:01 PM  [facility] [address] [place], [place], [address] Phone: [phone]   Fax:  [phone]  Name: Stephanie Hammond MRN: [id number] Date of Birth: September 04, 1950

## 2019-01-17 ENCOUNTER — Other Ambulatory Visit: Payer: Self-pay

## 2019-01-17 ENCOUNTER — Ambulatory Visit: Payer: Medicare HMO | Admitting: *Deleted

## 2019-01-17 DIAGNOSIS — M25662 Stiffness of left knee, not elsewhere classified: Secondary | ICD-10-CM | POA: Diagnosis not present

## 2019-01-17 DIAGNOSIS — M25562 Pain in left knee: Secondary | ICD-10-CM | POA: Diagnosis not present

## 2019-01-17 DIAGNOSIS — R293 Abnormal posture: Secondary | ICD-10-CM

## 2019-01-17 DIAGNOSIS — R262 Difficulty in walking, not elsewhere classified: Secondary | ICD-10-CM

## 2019-01-17 NOTE — Therapy (Signed)
Weeks    Status  New      PT LONG TERM GOAL #4   Title  Patient will report ability to negotiate 2 steps with reciprocating pattern and no railing to safely enter and exit her home.    Time  6    Period  Weeks    Status  New      PT LONG TERM GOAL #5   Title  Patient will report ability to perform ADLs with left knee pain less than or equal to 4/10.    Time  6    Period  Weeks    Status  New            Plan - 01/17/19 1140    Clinical Impression  Statement  Pt arrived today doing fair. She is ambulating with a rolling walker and step to gait pattern and reports trying not to let her hip externally rotate. Her pain is still 8/10, but was able to performe nustep and a few quad activation Exs. PROM was performed , but not toleratred well due to pain. Vaso and IFC were applied, but Pt. reported some discomfort with Vaso at 13 mins and was DC , but did well with IFC. Pt reports decreased pain end of session.    Personal Factors and Comorbidities  Comorbidity 2    Comorbidities  HTN, DM    Examination-Activity Limitations  Stairs;Stand;Sit    Stability/Clinical Decision Making  Stable/Uncomplicated    Clinical Decision Making  Low    Rehab Potential  Good    PT Frequency  3x / week    PT Duration  6 weeks    PT Treatment/Interventions  ADLs/Self Care Home Management;Cryotherapy;Moist Heat;Therapeutic activities;Therapeutic exercise;Patient/family education;Manual techniques;Electrical Stimulation;Balance training;Neuromuscular re-education;Vasopneumatic Device;Passive range of motion;Gait training;Stair training    PT Next Visit Plan  Nustep, emphasis on knee extension, left knee PROM, Modalities PRN for pain relief    PT Home Exercise Plan  see patient education section    Consulted and Agree with Plan of Care  Patient       Patient will benefit from skilled therapeutic intervention in order to improve the following deficits and impairments:  Pain, Decreased range of motion, Decreased strength, Decreased activity tolerance, Difficulty walking  Visit Diagnosis:    Stiffness of left knee, not elsewhere classified   Acute pain of left knee   Difficulty in walking, not elsewhere classified   Abnormal posture      Problem List Patient Active Problem List   Diagnosis Date Noted  . Failed total knee, left, initial encounter ([place]) 01/09/2019  . Anemia 12/31/2015  . Arthritis of knee, degenerative 03/12/2015  . Arthritis of left knee    . Gout 09/07/2014  . Back pain with left-sided radiculopathy 09/07/2014  . Type 2 diabetes mellitus with diabetic neuropathy ([place]) 02/14/2014  . Hyperlipidemia 03/12/2013  . IBS (irritable bowel syndrome) 01/09/2013  . Diabetic neuropathy ([place]) 11/14/2012  . Diarrhea 09/13/2012  . Major depression, single episode 09/13/2012  . Essential hypertension, benign 06/09/2012  . DDD (degenerative disc disease), lumbar 03/28/2012  . Chronic knee pain 03/28/2012  . Obesity 03/28/2012  . Tobacco user 03/28/2012  . CAD (coronary artery disease) 03/28/2012    Mattthew Ziomek,CHRIS PTA 01/17/2019, 12:40 PM  [facility] [address] [place], [place], [address] Phone: [phone]   Fax:  [phone]  Name: LUJEAN EBRIGHT MRN: [id number] Date of Birth: Jan 27, 1951  [facility] [place], [place], [address] Phone: [phone]   Fax:  [phone]  Physical Therapy Treatment  Patient Details  Name: KAMEELA LEIPOLD MRN: [id number] Date of Birth: 1950-11-14 Referring Provider (PT): Rod Can, MD   Encounter Date: 01/17/2019  PT End of Session - 01/17/19 1139    Visit Number  2    Number of Visits  18    Date for PT Re-Evaluation  03/05/19    Authorization Type  FOTO; progress note every 10th visit; KX modifier after 15th visit    PT Start Time  1115    PT Stop Time  1208    PT Time Calculation (min)  53 min       Past Medical History:  Diagnosis Date  . Anemia   . Blood transfusion 2009   after GI bleed  . CAD (coronary artery disease)   . Diabetes mellitus    2  . Diverticulitis   . Diverticulosis   . Fear of local anesthetic    01-02-2019, patient states with last surgery in 2016 , she refused to have epidural before she was sedated d/t to extreme aversion to needles near her back    . GERD (gastroesophageal reflux disease)   . Gout   . Headache    occ if sugar is  too hi or low   . Heart murmur    "i was told this years and years and years ago "  . History of GI diverticular bleed 08/2004  . Hypertension   . IBS (irritable bowel syndrome)   . Osteoarthritis   . Tachycardia    "it stays like that for years between 119 and 121 and it doesnt bother me  none "     Past Surgical History:  Procedure Laterality Date  . CHOLECYSTECTOMY  2003  . COLONOSCOPY  2000   Dr. Fuller Plan: marked diverticulosis, difficult procedure due to adhesions, polyps benign  . COLONOSCOPY  2006   Dr. Henrene Pastor: severe pandiverticulosis  . COLONOSCOPY N/A 02/03/2013   SLF: 4 COLORECTAL POLYPS REMOVED/Moderate diverticulosis throughout the entire examined colon/Small internal hemorrhoids  . JOINT REPLACEMENT Right 2009   Knee Replacement Right   . PTCA     over 10 years ago , unsure of exact year ,  does recall that no stents were placed   . REPLACEMENT TOTAL KNEE     right   . TOTAL KNEE ARTHROPLASTY Left 03/12/2015   Procedure: LEFT TOTAL KNEE REPLACEMENT;  Surgeon: Carole Civil, MD;  Location: [facility];  Service: Orthopedics;  Laterality: Left;  . TOTAL KNEE REVISION Left 01/09/2019   Procedure: TOTAL KNEE REVISION;  Surgeon: Rod Can, MD;  Location: [facility];  Service: Orthopedics;  Laterality: Left;  [name] VAGINAL HYSTERECTOMY      There were no vitals filed for this visit.  Subjective Assessment - 01/17/19 1132    Subjective  COVID19 screening performed prior to entering the building.  My knee pain is still 8/10, but I know I need to work it.    Pertinent History  left TKA revision 01/09/2019; R TKA 2009; lumbar DDD, HTN, DM    Limitations  Sitting;Standing;Walking;House hold activities    How long can you sit comfortably?  short periods before pain starts    How long can you stand comfortably?  short periods    How long can you walk comfortably?  household ambulation    Diagnostic tests  n/a    Patient  [facility] [place], [place], [address] Phone: [phone]   Fax:  [phone]  Physical Therapy Treatment  Patient Details  Name: KAMEELA LEIPOLD MRN: [id number] Date of Birth: 1950-11-14 Referring Provider (PT): Rod Can, MD   Encounter Date: 01/17/2019  PT End of Session - 01/17/19 1139    Visit Number  2    Number of Visits  18    Date for PT Re-Evaluation  03/05/19    Authorization Type  FOTO; progress note every 10th visit; KX modifier after 15th visit    PT Start Time  1115    PT Stop Time  1208    PT Time Calculation (min)  53 min       Past Medical History:  Diagnosis Date  . Anemia   . Blood transfusion 2009   after GI bleed  . CAD (coronary artery disease)   . Diabetes mellitus    2  . Diverticulitis   . Diverticulosis   . Fear of local anesthetic    01-02-2019, patient states with last surgery in 2016 , she refused to have epidural before she was sedated d/t to extreme aversion to needles near her back    . GERD (gastroesophageal reflux disease)   . Gout   . Headache    occ if sugar is  too hi or low   . Heart murmur    "i was told this years and years and years ago "  . History of GI diverticular bleed 08/2004  . Hypertension   . IBS (irritable bowel syndrome)   . Osteoarthritis   . Tachycardia    "it stays like that for years between 119 and 121 and it doesnt bother me  none "     Past Surgical History:  Procedure Laterality Date  . CHOLECYSTECTOMY  2003  . COLONOSCOPY  2000   Dr. Fuller Plan: marked diverticulosis, difficult procedure due to adhesions, polyps benign  . COLONOSCOPY  2006   Dr. Henrene Pastor: severe pandiverticulosis  . COLONOSCOPY N/A 02/03/2013   SLF: 4 COLORECTAL POLYPS REMOVED/Moderate diverticulosis throughout the entire examined colon/Small internal hemorrhoids  . JOINT REPLACEMENT Right 2009   Knee Replacement Right   . PTCA     over 10 years ago , unsure of exact year ,  does recall that no stents were placed   . REPLACEMENT TOTAL KNEE     right   . TOTAL KNEE ARTHROPLASTY Left 03/12/2015   Procedure: LEFT TOTAL KNEE REPLACEMENT;  Surgeon: Carole Civil, MD;  Location: [facility];  Service: Orthopedics;  Laterality: Left;  . TOTAL KNEE REVISION Left 01/09/2019   Procedure: TOTAL KNEE REVISION;  Surgeon: Rod Can, MD;  Location: [facility];  Service: Orthopedics;  Laterality: Left;  [name] VAGINAL HYSTERECTOMY      There were no vitals filed for this visit.  Subjective Assessment - 01/17/19 1132    Subjective  COVID19 screening performed prior to entering the building.  My knee pain is still 8/10, but I know I need to work it.    Pertinent History  left TKA revision 01/09/2019; R TKA 2009; lumbar DDD, HTN, DM    Limitations  Sitting;Standing;Walking;House hold activities    How long can you sit comfortably?  short periods before pain starts    How long can you stand comfortably?  short periods    How long can you walk comfortably?  household ambulation    Diagnostic tests  n/a    Patient

## 2019-01-20 ENCOUNTER — Encounter: Payer: Self-pay | Admitting: Physical Therapy

## 2019-01-20 ENCOUNTER — Ambulatory Visit: Payer: Medicare HMO | Admitting: Physical Therapy

## 2019-01-20 ENCOUNTER — Other Ambulatory Visit: Payer: Self-pay

## 2019-01-20 DIAGNOSIS — R262 Difficulty in walking, not elsewhere classified: Secondary | ICD-10-CM

## 2019-01-20 DIAGNOSIS — M25662 Stiffness of left knee, not elsewhere classified: Secondary | ICD-10-CM | POA: Diagnosis not present

## 2019-01-20 DIAGNOSIS — M25562 Pain in left knee: Secondary | ICD-10-CM

## 2019-01-20 DIAGNOSIS — R293 Abnormal posture: Secondary | ICD-10-CM | POA: Diagnosis not present

## 2019-01-20 NOTE — Therapy (Signed)
[facility] [place], [place], [address] Phone: [phone]   Fax:  [phone]  Physical Therapy Treatment  Patient Details  Name: Stephanie Hammond MRN: [id number] Date of Birth: 1950-11-07 Referring Provider (PT): Rod Can, MD   Encounter Date: 01/20/2019  PT End of Session - 01/20/19 1535    Visit Number  3    Number of Visits  18    Date for PT Re-Evaluation  03/05/19    Authorization Type  FOTO; progress note every 10th visit; KX modifier after 15th visit    PT Start Time  1408   2 units secondary increased pain   PT Stop Time  1448    PT Time Calculation (min)  40 min    Equipment Utilized During Treatment  Other (comment)   rolling walker   Activity Tolerance  Patient limited by pain    Behavior During Therapy  [facility] for tasks assessed/performed       Past Medical History:  Diagnosis Date  . Anemia   . Blood transfusion 2009   after GI bleed  . CAD (coronary artery disease)   . Diabetes mellitus    2  . Diverticulitis   . Diverticulosis   . Fear of local anesthetic    01-02-2019, patient states with last surgery in 2016 , she refused to have epidural before she was sedated d/t to extreme aversion to needles near her back    . GERD (gastroesophageal reflux disease)   . Gout   . Headache    occ if sugar is  too hi or low   . Heart murmur    "i was told this years and years and years ago "  . History of GI diverticular bleed 08/2004  . Hypertension   . IBS (irritable bowel syndrome)   . Osteoarthritis   . Tachycardia    "it stays like that for years between 119 and 121 and it doesnt bother me  none "     Past Surgical History:  Procedure Laterality Date  . CHOLECYSTECTOMY  2003  . COLONOSCOPY  2000   Dr. Fuller Plan: marked diverticulosis, difficult procedure due to adhesions, polyps benign  . COLONOSCOPY  2006   Dr. Henrene Pastor: severe pandiverticulosis  . COLONOSCOPY N/A 02/03/2013   SLF: 4 COLORECTAL  POLYPS REMOVED/Moderate diverticulosis throughout the entire examined colon/Small internal hemorrhoids  . JOINT REPLACEMENT Right 2009   Knee Replacement Right   . PTCA     over 10 years ago , unsure of exact year , does recall that no stents were placed   . REPLACEMENT TOTAL KNEE     right   . TOTAL KNEE ARTHROPLASTY Left 03/12/2015   Procedure: LEFT TOTAL KNEE REPLACEMENT;  Surgeon: Carole Civil, MD;  Location: [facility];  Service: Orthopedics;  Laterality: Left;  . TOTAL KNEE REVISION Left 01/09/2019   Procedure: TOTAL KNEE REVISION;  Surgeon: Rod Can, MD;  Location: [facility];  Service: Orthopedics;  Laterality: Left;  [name] VAGINAL HYSTERECTOMY      There were no vitals filed for this visit.  Subjective Assessment - 01/20/19 1429    Subjective  COVID19 screening performed prior to entering the building. Reports  she had a rough weekend secondary to pain. "it was above a 10." Patient reports pain "wasn't as bad" upon arrival but reported pain with movement.    Pertinent History  left TKA revision 01/09/2019; R TKA 2009; lumbar DDD, HTN, DM    Limitations  Arthritis of left knee   . Gout 09/07/2014  . Back pain with left-sided radiculopathy 09/07/2014  . Type 2 diabetes mellitus with diabetic neuropathy ([place]) 02/14/2014  . Hyperlipidemia 03/12/2013  . IBS (irritable bowel syndrome) 01/09/2013  . Diabetic neuropathy ([place]) 11/14/2012  . Diarrhea 09/13/2012  . Major depression, single episode 09/13/2012  . Essential hypertension, benign 06/09/2012  . DDD (degenerative disc disease), lumbar 03/28/2012  . Chronic knee pain 03/28/2012  . Obesity 03/28/2012  . Tobacco user 03/28/2012  . CAD (coronary artery disease) 03/28/2012   Gabriela Eves, PT, DPT 01/20/2019, 3:39 PM  [facility] [address] [place], [place], [address] Phone: [phone]   Fax:  [phone]  Name: Stephanie Hammond MRN: [id number] Date of Birth: Aug 21, 1950  [facility] [place], [place], [address] Phone: [phone]   Fax:  [phone]  Physical Therapy Treatment  Patient Details  Name: Stephanie Hammond MRN: [id number] Date of Birth: 1950-11-07 Referring Provider (PT): Rod Can, MD   Encounter Date: 01/20/2019  PT End of Session - 01/20/19 1535    Visit Number  3    Number of Visits  18    Date for PT Re-Evaluation  03/05/19    Authorization Type  FOTO; progress note every 10th visit; KX modifier after 15th visit    PT Start Time  1408   2 units secondary increased pain   PT Stop Time  1448    PT Time Calculation (min)  40 min    Equipment Utilized During Treatment  Other (comment)   rolling walker   Activity Tolerance  Patient limited by pain    Behavior During Therapy  [facility] for tasks assessed/performed       Past Medical History:  Diagnosis Date  . Anemia   . Blood transfusion 2009   after GI bleed  . CAD (coronary artery disease)   . Diabetes mellitus    2  . Diverticulitis   . Diverticulosis   . Fear of local anesthetic    01-02-2019, patient states with last surgery in 2016 , she refused to have epidural before she was sedated d/t to extreme aversion to needles near her back    . GERD (gastroesophageal reflux disease)   . Gout   . Headache    occ if sugar is  too hi or low   . Heart murmur    "i was told this years and years and years ago "  . History of GI diverticular bleed 08/2004  . Hypertension   . IBS (irritable bowel syndrome)   . Osteoarthritis   . Tachycardia    "it stays like that for years between 119 and 121 and it doesnt bother me  none "     Past Surgical History:  Procedure Laterality Date  . CHOLECYSTECTOMY  2003  . COLONOSCOPY  2000   Dr. Fuller Plan: marked diverticulosis, difficult procedure due to adhesions, polyps benign  . COLONOSCOPY  2006   Dr. Henrene Pastor: severe pandiverticulosis  . COLONOSCOPY N/A 02/03/2013   SLF: 4 COLORECTAL  POLYPS REMOVED/Moderate diverticulosis throughout the entire examined colon/Small internal hemorrhoids  . JOINT REPLACEMENT Right 2009   Knee Replacement Right   . PTCA     over 10 years ago , unsure of exact year , does recall that no stents were placed   . REPLACEMENT TOTAL KNEE     right   . TOTAL KNEE ARTHROPLASTY Left 03/12/2015   Procedure: LEFT TOTAL KNEE REPLACEMENT;  Surgeon: Carole Civil, MD;  Location: [facility];  Service: Orthopedics;  Laterality: Left;  . TOTAL KNEE REVISION Left 01/09/2019   Procedure: TOTAL KNEE REVISION;  Surgeon: Rod Can, MD;  Location: [facility];  Service: Orthopedics;  Laterality: Left;  [name] VAGINAL HYSTERECTOMY      There were no vitals filed for this visit.  Subjective Assessment - 01/20/19 1429    Subjective  COVID19 screening performed prior to entering the building. Reports  she had a rough weekend secondary to pain. "it was above a 10." Patient reports pain "wasn't as bad" upon arrival but reported pain with movement.    Pertinent History  left TKA revision 01/09/2019; R TKA 2009; lumbar DDD, HTN, DM    Limitations  [facility] [place], [place], [address] Phone: [phone]   Fax:  [phone]  Physical Therapy Treatment  Patient Details  Name: Stephanie Hammond MRN: [id number] Date of Birth: 1950-11-07 Referring Provider (PT): Rod Can, MD   Encounter Date: 01/20/2019  PT End of Session - 01/20/19 1535    Visit Number  3    Number of Visits  18    Date for PT Re-Evaluation  03/05/19    Authorization Type  FOTO; progress note every 10th visit; KX modifier after 15th visit    PT Start Time  1408   2 units secondary increased pain   PT Stop Time  1448    PT Time Calculation (min)  40 min    Equipment Utilized During Treatment  Other (comment)   rolling walker   Activity Tolerance  Patient limited by pain    Behavior During Therapy  [facility] for tasks assessed/performed       Past Medical History:  Diagnosis Date  . Anemia   . Blood transfusion 2009   after GI bleed  . CAD (coronary artery disease)   . Diabetes mellitus    2  . Diverticulitis   . Diverticulosis   . Fear of local anesthetic    01-02-2019, patient states with last surgery in 2016 , she refused to have epidural before she was sedated d/t to extreme aversion to needles near her back    . GERD (gastroesophageal reflux disease)   . Gout   . Headache    occ if sugar is  too hi or low   . Heart murmur    "i was told this years and years and years ago "  . History of GI diverticular bleed 08/2004  . Hypertension   . IBS (irritable bowel syndrome)   . Osteoarthritis   . Tachycardia    "it stays like that for years between 119 and 121 and it doesnt bother me  none "     Past Surgical History:  Procedure Laterality Date  . CHOLECYSTECTOMY  2003  . COLONOSCOPY  2000   Dr. Fuller Plan: marked diverticulosis, difficult procedure due to adhesions, polyps benign  . COLONOSCOPY  2006   Dr. Henrene Pastor: severe pandiverticulosis  . COLONOSCOPY N/A 02/03/2013   SLF: 4 COLORECTAL  POLYPS REMOVED/Moderate diverticulosis throughout the entire examined colon/Small internal hemorrhoids  . JOINT REPLACEMENT Right 2009   Knee Replacement Right   . PTCA     over 10 years ago , unsure of exact year , does recall that no stents were placed   . REPLACEMENT TOTAL KNEE     right   . TOTAL KNEE ARTHROPLASTY Left 03/12/2015   Procedure: LEFT TOTAL KNEE REPLACEMENT;  Surgeon: Carole Civil, MD;  Location: [facility];  Service: Orthopedics;  Laterality: Left;  . TOTAL KNEE REVISION Left 01/09/2019   Procedure: TOTAL KNEE REVISION;  Surgeon: Rod Can, MD;  Location: [facility];  Service: Orthopedics;  Laterality: Left;  [name] VAGINAL HYSTERECTOMY      There were no vitals filed for this visit.  Subjective Assessment - 01/20/19 1429    Subjective  COVID19 screening performed prior to entering the building. Reports  she had a rough weekend secondary to pain. "it was above a 10." Patient reports pain "wasn't as bad" upon arrival but reported pain with movement.    Pertinent History  left TKA revision 01/09/2019; R TKA 2009; lumbar DDD, HTN, DM    Limitations

## 2019-01-21 DIAGNOSIS — Z96652 Presence of left artificial knee joint: Secondary | ICD-10-CM | POA: Diagnosis not present

## 2019-01-21 DIAGNOSIS — Z471 Aftercare following joint replacement surgery: Secondary | ICD-10-CM | POA: Diagnosis not present

## 2019-01-22 ENCOUNTER — Other Ambulatory Visit: Payer: Self-pay

## 2019-01-22 ENCOUNTER — Encounter: Payer: Self-pay | Admitting: Physical Therapy

## 2019-01-22 ENCOUNTER — Ambulatory Visit: Payer: Medicare HMO | Admitting: Physical Therapy

## 2019-01-22 DIAGNOSIS — M25562 Pain in left knee: Secondary | ICD-10-CM | POA: Diagnosis not present

## 2019-01-22 DIAGNOSIS — R293 Abnormal posture: Secondary | ICD-10-CM

## 2019-01-22 DIAGNOSIS — R262 Difficulty in walking, not elsewhere classified: Secondary | ICD-10-CM

## 2019-01-22 DIAGNOSIS — M25662 Stiffness of left knee, not elsewhere classified: Secondary | ICD-10-CM

## 2019-01-22 NOTE — Therapy (Addendum)
Obesity 03/28/2012  . Tobacco user 03/28/2012  . CAD (coronary artery disease) 03/28/2012    Standley Brooking, PTA 01/22/2019, 2:02 PM  [facility] [address] [place], [place], [address] Phone: [phone]   Fax:  [phone]  Name: Stephanie Hammond MRN: [id number] Date of Birth: 06-Mar-1951  [facility] [place], [place], [address] Phone: [phone]   Fax:  [phone]  Physical Therapy Treatment  Patient Details  Name: Stephanie Hammond MRN: [id number] Date of Birth: Jan 26, 1951 Referring Provider (PT): Rod Can, MD   Encounter Date: 01/22/2019  PT End of Session - 01/22/19 1327    Visit Number  4    Number of Visits  18    Date for PT Re-Evaluation  03/05/19    Authorization Type  FOTO; progress note every 10th visit; KX modifier after 15th visit    PT Start Time  1305    PT Stop Time  1331   requested only modalities today   PT Time Calculation (min)  26 min    Equipment Utilized During Treatment  Other (comment)   FWW   Activity Tolerance  Patient limited by pain    Behavior During Therapy  [facility] for tasks assessed/performed       Past Medical History:  Diagnosis Date  . Anemia   . Blood transfusion 2009   after GI bleed  . CAD (coronary artery disease)   . Diabetes mellitus    2  . Diverticulitis   . Diverticulosis   . Fear of local anesthetic    01-02-2019, patient states with last surgery in 2016 , she refused to have epidural before she was sedated d/t to extreme aversion to needles near her back    . GERD (gastroesophageal reflux disease)   . Gout   . Headache    occ if sugar is  too hi or low   . Heart murmur    "i was told this years and years and years ago "  . History of GI diverticular bleed 08/2004  . Hypertension   . IBS (irritable bowel syndrome)   . Osteoarthritis   . Tachycardia    "it stays like that for years between 119 and 121 and it doesnt bother me  none "     Past Surgical History:  Procedure Laterality Date  . CHOLECYSTECTOMY  2003  . COLONOSCOPY  2000   Dr. Fuller Plan: marked diverticulosis, difficult procedure due to adhesions, polyps benign  . COLONOSCOPY  2006   Dr. Henrene Pastor: severe pandiverticulosis  . COLONOSCOPY N/A 02/03/2013   SLF: 4 COLORECTAL POLYPS  REMOVED/Moderate diverticulosis throughout the entire examined colon/Small internal hemorrhoids  . JOINT REPLACEMENT Right 2009   Knee Replacement Right   . PTCA     over 10 years ago , unsure of exact year , does recall that no stents were placed   . REPLACEMENT TOTAL KNEE     right   . TOTAL KNEE ARTHROPLASTY Left 03/12/2015   Procedure: LEFT TOTAL KNEE REPLACEMENT;  Surgeon: Carole Civil, MD;  Location: [facility];  Service: Orthopedics;  Laterality: Left;  . TOTAL KNEE REVISION Left 01/09/2019   Procedure: TOTAL KNEE REVISION;  Surgeon: Rod Can, MD;  Location: [facility];  Service: Orthopedics;  Laterality: Left;  [name] VAGINAL HYSTERECTOMY      There were no vitals filed for this visit.  Subjective Assessment - 01/22/19 1313    Subjective  COVID 19 screening performed on patient upon arrival. Patient reports that MD accused her of taking a shower or sweating when he saw how much drainage was on the post surgical dressing. Reports that she still is very sore surrounding the knee and the knee still has heat in it.    Pertinent History  left TKA revision 01/09/2019;  [facility] [place], [place], [address] Phone: [phone]   Fax:  [phone]  Physical Therapy Treatment  Patient Details  Name: Stephanie Hammond MRN: [id number] Date of Birth: Jan 26, 1951 Referring Provider (PT): Rod Can, MD   Encounter Date: 01/22/2019  PT End of Session - 01/22/19 1327    Visit Number  4    Number of Visits  18    Date for PT Re-Evaluation  03/05/19    Authorization Type  FOTO; progress note every 10th visit; KX modifier after 15th visit    PT Start Time  1305    PT Stop Time  1331   requested only modalities today   PT Time Calculation (min)  26 min    Equipment Utilized During Treatment  Other (comment)   FWW   Activity Tolerance  Patient limited by pain    Behavior During Therapy  [facility] for tasks assessed/performed       Past Medical History:  Diagnosis Date  . Anemia   . Blood transfusion 2009   after GI bleed  . CAD (coronary artery disease)   . Diabetes mellitus    2  . Diverticulitis   . Diverticulosis   . Fear of local anesthetic    01-02-2019, patient states with last surgery in 2016 , she refused to have epidural before she was sedated d/t to extreme aversion to needles near her back    . GERD (gastroesophageal reflux disease)   . Gout   . Headache    occ if sugar is  too hi or low   . Heart murmur    "i was told this years and years and years ago "  . History of GI diverticular bleed 08/2004  . Hypertension   . IBS (irritable bowel syndrome)   . Osteoarthritis   . Tachycardia    "it stays like that for years between 119 and 121 and it doesnt bother me  none "     Past Surgical History:  Procedure Laterality Date  . CHOLECYSTECTOMY  2003  . COLONOSCOPY  2000   Dr. Fuller Plan: marked diverticulosis, difficult procedure due to adhesions, polyps benign  . COLONOSCOPY  2006   Dr. Henrene Pastor: severe pandiverticulosis  . COLONOSCOPY N/A 02/03/2013   SLF: 4 COLORECTAL POLYPS  REMOVED/Moderate diverticulosis throughout the entire examined colon/Small internal hemorrhoids  . JOINT REPLACEMENT Right 2009   Knee Replacement Right   . PTCA     over 10 years ago , unsure of exact year , does recall that no stents were placed   . REPLACEMENT TOTAL KNEE     right   . TOTAL KNEE ARTHROPLASTY Left 03/12/2015   Procedure: LEFT TOTAL KNEE REPLACEMENT;  Surgeon: Carole Civil, MD;  Location: [facility];  Service: Orthopedics;  Laterality: Left;  . TOTAL KNEE REVISION Left 01/09/2019   Procedure: TOTAL KNEE REVISION;  Surgeon: Rod Can, MD;  [facility]: [facility];  Service: Orthopedics;  Laterality: Left;  [name] VAGINAL HYSTERECTOMY      There were no vitals filed for this visit.  Subjective Assessment - 01/22/19 1313    Subjective  COVID 19 screening performed on patient upon arrival. Patient reports that MD accused her of taking a shower or sweating when he saw how much drainage was on the post surgical dressing. Reports that she still is very sore surrounding the knee and the knee still has heat in it.    Pertinent History  left TKA revision 01/09/2019;  [facility] [place], [place], [address] Phone: [phone]   Fax:  [phone]  Physical Therapy Treatment  Patient Details  Name: Stephanie Hammond MRN: [id number] Date of Birth: Jan 26, 1951 Referring Provider (PT): Rod Can, MD   Encounter Date: 01/22/2019  PT End of Session - 01/22/19 1327    Visit Number  4    Number of Visits  18    Date for PT Re-Evaluation  03/05/19    Authorization Type  FOTO; progress note every 10th visit; KX modifier after 15th visit    PT Start Time  1305    PT Stop Time  1331   requested only modalities today   PT Time Calculation (min)  26 min    Equipment Utilized During Treatment  Other (comment)   FWW   Activity Tolerance  Patient limited by pain    Behavior During Therapy  [facility] for tasks assessed/performed       Past Medical History:  Diagnosis Date  . Anemia   . Blood transfusion 2009   after GI bleed  . CAD (coronary artery disease)   . Diabetes mellitus    2  . Diverticulitis   . Diverticulosis   . Fear of local anesthetic    01-02-2019, patient states with last surgery in 2016 , she refused to have epidural before she was sedated d/t to extreme aversion to needles near her back    . GERD (gastroesophageal reflux disease)   . Gout   . Headache    occ if sugar is  too hi or low   . Heart murmur    "i was told this years and years and years ago "  . History of GI diverticular bleed 08/2004  . Hypertension   . IBS (irritable bowel syndrome)   . Osteoarthritis   . Tachycardia    "it stays like that for years between 119 and 121 and it doesnt bother me  none "     Past Surgical History:  Procedure Laterality Date  . CHOLECYSTECTOMY  2003  . COLONOSCOPY  2000   Dr. Fuller Plan: marked diverticulosis, difficult procedure due to adhesions, polyps benign  . COLONOSCOPY  2006   Dr. Henrene Pastor: severe pandiverticulosis  . COLONOSCOPY N/A 02/03/2013   SLF: 4 COLORECTAL POLYPS  REMOVED/Moderate diverticulosis throughout the entire examined colon/Small internal hemorrhoids  . JOINT REPLACEMENT Right 2009   Knee Replacement Right   . PTCA     over 10 years ago , unsure of exact year , does recall that no stents were placed   . REPLACEMENT TOTAL KNEE     right   . TOTAL KNEE ARTHROPLASTY Left 03/12/2015   Procedure: LEFT TOTAL KNEE REPLACEMENT;  Surgeon: Carole Civil, MD;  Location: [facility];  Service: Orthopedics;  Laterality: Left;  . TOTAL KNEE REVISION Left 01/09/2019   Procedure: TOTAL KNEE REVISION;  Surgeon: Rod Can, MD;  [facility]: [facility];  Service: Orthopedics;  Laterality: Left;  [name] VAGINAL HYSTERECTOMY      There were no vitals filed for this visit.  Subjective Assessment - 01/22/19 1313    Subjective  COVID 19 screening performed on patient upon arrival. Patient reports that MD accused her of taking a shower or sweating when he saw how much drainage was on the post surgical dressing. Reports that she still is very sore surrounding the knee and the knee still has heat in it.    Pertinent History  left TKA revision 01/09/2019;

## 2019-01-24 ENCOUNTER — Ambulatory Visit: Payer: Medicare HMO | Admitting: Physical Therapy

## 2019-01-24 ENCOUNTER — Other Ambulatory Visit: Payer: Self-pay

## 2019-01-24 DIAGNOSIS — M25662 Stiffness of left knee, not elsewhere classified: Secondary | ICD-10-CM

## 2019-01-24 DIAGNOSIS — R262 Difficulty in walking, not elsewhere classified: Secondary | ICD-10-CM

## 2019-01-24 DIAGNOSIS — R293 Abnormal posture: Secondary | ICD-10-CM | POA: Diagnosis not present

## 2019-01-24 DIAGNOSIS — M25562 Pain in left knee: Secondary | ICD-10-CM

## 2019-01-24 NOTE — Therapy (Signed)
[facility] [place], [place], [address] Phone: [phone]   Fax:  [phone]  Physical Therapy Treatment  Patient Details  Name: Stephanie Hammond MRN: [id number] Date of Birth: Apr 10, 1951 Referring Provider (PT): Rod Can, MD   Encounter Date: 01/24/2019  PT End of Session - 01/24/19 1210    Visit Number  5    Number of Visits  18    Date for PT Re-Evaluation  03/05/19    Authorization Type  FOTO; progress note every 10th visit; KX modifier after 15th visit    PT Start Time  1124    PT Stop Time  1209    PT Time Calculation (min)  45 min    Activity Tolerance  Patient limited by pain    Behavior During Therapy  [facility] for tasks assessed/performed       Past Medical History:  Diagnosis Date  . Anemia   . Blood transfusion 2009   after GI bleed  . CAD (coronary artery disease)   . Diabetes mellitus    2  . Diverticulitis   . Diverticulosis   . Fear of local anesthetic    01-02-2019, patient states with last surgery in 2016 , she refused to have epidural before she was sedated d/t to extreme aversion to needles near her back    . GERD (gastroesophageal reflux disease)   . Gout   . Headache    occ if sugar is  too hi or low   . Heart murmur    "i was told this years and years and years ago "  . History of GI diverticular bleed 08/2004  . Hypertension   . IBS (irritable bowel syndrome)   . Osteoarthritis   . Tachycardia    "it stays like that for years between 119 and 121 and it doesnt bother me  none "     Past Surgical History:  Procedure Laterality Date  . CHOLECYSTECTOMY  2003  . COLONOSCOPY  2000   Dr. Fuller Plan: marked diverticulosis, difficult procedure due to adhesions, polyps benign  . COLONOSCOPY  2006   Dr. Henrene Pastor: severe pandiverticulosis  . COLONOSCOPY N/A 02/03/2013   SLF: 4 COLORECTAL POLYPS REMOVED/Moderate diverticulosis throughout the entire examined colon/Small internal hemorrhoids  .  JOINT REPLACEMENT Right 2009   Knee Replacement Right   . PTCA     over 10 years ago , unsure of exact year , does recall that no stents were placed   . REPLACEMENT TOTAL KNEE     right   . TOTAL KNEE ARTHROPLASTY Left 03/12/2015   Procedure: LEFT TOTAL KNEE REPLACEMENT;  Surgeon: Carole Civil, MD;  Location: [facility];  Service: Orthopedics;  Laterality: Left;  . TOTAL KNEE REVISION Left 01/09/2019   Procedure: TOTAL KNEE REVISION;  Surgeon: Rod Can, MD;  [facility]: [facility];  Service: Orthopedics;  Laterality: Left;  [name] VAGINAL HYSTERECTOMY      There were no vitals filed for this visit.  Subjective Assessment - 01/24/19 1155    Subjective  COVID-19 screen performed prior to patient entering clinic.  Hurting a lot, I think its because of the staples.    Currently in Pain?  Yes    Pain Score  9     Pain Location  Knee    Pain Orientation  Left    Pain Descriptors / Indicators  Aching    Pain Type  Surgical pain    Pain Onset  1 to 4 weeks ago  into extension.  She will have staples removed on 6/23.       Patient will benefit from skilled therapeutic intervention in order to improve the following deficits and impairments:     Visit Diagnosis: 1. Stiffness of left knee, not elsewhere classified   2. Acute pain of left knee   3. Difficulty in walking, not elsewhere classified   4. Abnormal posture        Problem List Patient Active Problem List   Diagnosis Date Noted  . Failed total knee, left, initial encounter  ([place]) 01/09/2019  . Anemia 12/31/2015  . Arthritis of knee, degenerative 03/12/2015  . Arthritis of left knee   . Gout 09/07/2014  . Back pain with left-sided radiculopathy 09/07/2014  . Type 2 diabetes mellitus with diabetic neuropathy ([place]) 02/14/2014  . Hyperlipidemia 03/12/2013  . IBS (irritable bowel syndrome) 01/09/2013  . Diabetic neuropathy ([place]) 11/14/2012  . Diarrhea 09/13/2012  . Major depression, single episode 09/13/2012  . Essential hypertension, benign 06/09/2012  . DDD (degenerative disc disease), lumbar 03/28/2012  . Chronic knee pain 03/28/2012  . Obesity 03/28/2012  . Tobacco user 03/28/2012  . CAD (coronary artery disease) 03/28/2012    Isham Smitherman, [place] MPT 01/24/2019, 12:13 PM  [facility] [address] [place], [place], [address] Phone: [phone]   Fax:  [phone]  Name: Stephanie Hammond MRN: [id number] Date of Birth: Nov 04, 1950  [facility] [place], [place], [address] Phone: [phone]   Fax:  [phone]  Physical Therapy Treatment  Patient Details  Name: Stephanie Hammond MRN: [id number] Date of Birth: Apr 10, 1951 Referring Provider (PT): Rod Can, MD   Encounter Date: 01/24/2019  PT End of Session - 01/24/19 1210    Visit Number  5    Number of Visits  18    Date for PT Re-Evaluation  03/05/19    Authorization Type  FOTO; progress note every 10th visit; KX modifier after 15th visit    PT Start Time  1124    PT Stop Time  1209    PT Time Calculation (min)  45 min    Activity Tolerance  Patient limited by pain    Behavior During Therapy  [facility] for tasks assessed/performed       Past Medical History:  Diagnosis Date  . Anemia   . Blood transfusion 2009   after GI bleed  . CAD (coronary artery disease)   . Diabetes mellitus    2  . Diverticulitis   . Diverticulosis   . Fear of local anesthetic    01-02-2019, patient states with last surgery in 2016 , she refused to have epidural before she was sedated d/t to extreme aversion to needles near her back    . GERD (gastroesophageal reflux disease)   . Gout   . Headache    occ if sugar is  too hi or low   . Heart murmur    "i was told this years and years and years ago "  . History of GI diverticular bleed 08/2004  . Hypertension   . IBS (irritable bowel syndrome)   . Osteoarthritis   . Tachycardia    "it stays like that for years between 119 and 121 and it doesnt bother me  none "     Past Surgical History:  Procedure Laterality Date  . CHOLECYSTECTOMY  2003  . COLONOSCOPY  2000   Dr. Fuller Plan: marked diverticulosis, difficult procedure due to adhesions, polyps benign  . COLONOSCOPY  2006   Dr. Henrene Pastor: severe pandiverticulosis  . COLONOSCOPY N/A 02/03/2013   SLF: 4 COLORECTAL POLYPS REMOVED/Moderate diverticulosis throughout the entire examined colon/Small internal hemorrhoids  .  JOINT REPLACEMENT Right 2009   Knee Replacement Right   . PTCA     over 10 years ago , unsure of exact year , does recall that no stents were placed   . REPLACEMENT TOTAL KNEE     right   . TOTAL KNEE ARTHROPLASTY Left 03/12/2015   Procedure: LEFT TOTAL KNEE REPLACEMENT;  Surgeon: Carole Civil, MD;  Location: [facility];  Service: Orthopedics;  Laterality: Left;  . TOTAL KNEE REVISION Left 01/09/2019   Procedure: TOTAL KNEE REVISION;  Surgeon: Rod Can, MD;  Location: [facility];  Service: Orthopedics;  Laterality: Left;  [name] VAGINAL HYSTERECTOMY      There were no vitals filed for this visit.  Subjective Assessment - 01/24/19 1155    Subjective  COVID-19 screen performed prior to patient entering clinic.  Hurting a lot, I think its because of the staples.    Currently in Pain?  Yes    Pain Score  9     Pain Location  Knee    Pain Orientation  Left    Pain Descriptors / Indicators  Aching    Pain Type  Surgical pain    Pain Onset  1 to 4 weeks ago

## 2019-01-27 ENCOUNTER — Ambulatory Visit: Payer: Medicare HMO | Admitting: Physical Therapy

## 2019-01-27 ENCOUNTER — Encounter: Payer: Self-pay | Admitting: Physical Therapy

## 2019-01-27 ENCOUNTER — Other Ambulatory Visit: Payer: Self-pay

## 2019-01-27 DIAGNOSIS — R262 Difficulty in walking, not elsewhere classified: Secondary | ICD-10-CM

## 2019-01-27 DIAGNOSIS — M25562 Pain in left knee: Secondary | ICD-10-CM | POA: Diagnosis not present

## 2019-01-27 DIAGNOSIS — M25662 Stiffness of left knee, not elsewhere classified: Secondary | ICD-10-CM

## 2019-01-27 DIAGNOSIS — R293 Abnormal posture: Secondary | ICD-10-CM | POA: Diagnosis not present

## 2019-01-27 NOTE — Therapy (Signed)
[facility] [place], [place], [address] Phone: [phone]   Fax:  [phone]  Physical Therapy Treatment  Patient Details  Name: Stephanie Hammond MRN: [id number] Date of Birth: 26-Feb-1951 Referring Provider (PT): Rod Can, MD   Encounter Date: 01/27/2019  PT End of Session - 01/27/19 1123    Visit Number  6    Number of Visits  18    Date for PT Re-Evaluation  03/05/19    Authorization Type  FOTO; progress note every 10th visit; KX modifier after 15th visit    PT Start Time  1115    PT Stop Time  1206    PT Time Calculation (min)  51 min    Equipment Utilized During Treatment  Other (comment)   Rolling walker   Activity Tolerance  Patient limited by pain    Behavior During Therapy  [facility] for tasks assessed/performed       Past Medical History:  Diagnosis Date  . Anemia   . Blood transfusion 2009   after GI bleed  . CAD (coronary artery disease)   . Diabetes mellitus    2  . Diverticulitis   . Diverticulosis   . Fear of local anesthetic    01-02-2019, patient states with last surgery in 2016 , she refused to have epidural before she was sedated d/t to extreme aversion to needles near her back    . GERD (gastroesophageal reflux disease)   . Gout   . Headache    occ if sugar is  too hi or low   . Heart murmur    "i was told this years and years and years ago "  . History of GI diverticular bleed 08/2004  . Hypertension   . IBS (irritable bowel syndrome)   . Osteoarthritis   . Tachycardia    "it stays like that for years between 119 and 121 and it doesnt bother me  none "     Past Surgical History:  Procedure Laterality Date  . CHOLECYSTECTOMY  2003  . COLONOSCOPY  2000   Dr. Fuller Plan: marked diverticulosis, difficult procedure due to adhesions, polyps benign  . COLONOSCOPY  2006   Dr. Henrene Pastor: severe pandiverticulosis  . COLONOSCOPY N/A 02/03/2013   SLF: 4 COLORECTAL POLYPS REMOVED/Moderate  diverticulosis throughout the entire examined colon/Small internal hemorrhoids  . JOINT REPLACEMENT Right 2009   Knee Replacement Right   . PTCA     over 10 years ago , unsure of exact year , does recall that no stents were placed   . REPLACEMENT TOTAL KNEE     right   . TOTAL KNEE ARTHROPLASTY Left 03/12/2015   Procedure: LEFT TOTAL KNEE REPLACEMENT;  Surgeon: Carole Civil, MD;  Location: [facility];  Service: Orthopedics;  Laterality: Left;  . TOTAL KNEE REVISION Left 01/09/2019   Procedure: TOTAL KNEE REVISION;  Surgeon: Rod Can, MD;  Location: [facility];  Service: Orthopedics;  Laterality: Left;  [name] VAGINAL HYSTERECTOMY      There were no vitals filed for this visit.  Subjective Assessment - 01/27/19 1121    Subjective  COVID-19 screen performed prior to patient entering clinic. Reports "its not hurting half as much as last week"    Pertinent History  left TKA revision 01/09/2019; R TKA 2009; lumbar DDD, HTN, DM    Limitations  Sitting;Standing;Walking;House hold activities    How long can you sit comfortably?  short periods before pain starts    How long can  [facility] [place], [place], [address] Phone: [phone]   Fax:  [phone]  Physical Therapy Treatment  Patient Details  Name: Stephanie Hammond MRN: [id number] Date of Birth: 26-Feb-1951 Referring Provider (PT): Rod Can, MD   Encounter Date: 01/27/2019  PT End of Session - 01/27/19 1123    Visit Number  6    Number of Visits  18    Date for PT Re-Evaluation  03/05/19    Authorization Type  FOTO; progress note every 10th visit; KX modifier after 15th visit    PT Start Time  1115    PT Stop Time  1206    PT Time Calculation (min)  51 min    Equipment Utilized During Treatment  Other (comment)   Rolling walker   Activity Tolerance  Patient limited by pain    Behavior During Therapy  [facility] for tasks assessed/performed       Past Medical History:  Diagnosis Date  . Anemia   . Blood transfusion 2009   after GI bleed  . CAD (coronary artery disease)   . Diabetes mellitus    2  . Diverticulitis   . Diverticulosis   . Fear of local anesthetic    01-02-2019, patient states with last surgery in 2016 , she refused to have epidural before she was sedated d/t to extreme aversion to needles near her back    . GERD (gastroesophageal reflux disease)   . Gout   . Headache    occ if sugar is  too hi or low   . Heart murmur    "i was told this years and years and years ago "  . History of GI diverticular bleed 08/2004  . Hypertension   . IBS (irritable bowel syndrome)   . Osteoarthritis   . Tachycardia    "it stays like that for years between 119 and 121 and it doesnt bother me  none "     Past Surgical History:  Procedure Laterality Date  . CHOLECYSTECTOMY  2003  . COLONOSCOPY  2000   Dr. Fuller Plan: marked diverticulosis, difficult procedure due to adhesions, polyps benign  . COLONOSCOPY  2006   Dr. Henrene Pastor: severe pandiverticulosis  . COLONOSCOPY N/A 02/03/2013   SLF: 4 COLORECTAL POLYPS REMOVED/Moderate  diverticulosis throughout the entire examined colon/Small internal hemorrhoids  . JOINT REPLACEMENT Right 2009   Knee Replacement Right   . PTCA     over 10 years ago , unsure of exact year , does recall that no stents were placed   . REPLACEMENT TOTAL KNEE     right   . TOTAL KNEE ARTHROPLASTY Left 03/12/2015   Procedure: LEFT TOTAL KNEE REPLACEMENT;  Surgeon: Carole Civil, MD;  Location: [facility];  Service: Orthopedics;  Laterality: Left;  . TOTAL KNEE REVISION Left 01/09/2019   Procedure: TOTAL KNEE REVISION;  Surgeon: Rod Can, MD;  Location: [facility];  Service: Orthopedics;  Laterality: Left;  [name] VAGINAL HYSTERECTOMY      There were no vitals filed for this visit.  Subjective Assessment - 01/27/19 1121    Subjective  COVID-19 screen performed prior to patient entering clinic. Reports "its not hurting half as much as last week"    Pertinent History  left TKA revision 01/09/2019; R TKA 2009; lumbar DDD, HTN, DM    Limitations  Sitting;Standing;Walking;House hold activities    How long can you sit comfortably?  short periods before pain starts    How long can  DDD (degenerative disc disease), lumbar 03/28/2012  . Chronic knee pain 03/28/2012  . Obesity 03/28/2012  . Tobacco user 03/28/2012  . CAD (coronary artery disease) 03/28/2012     Gabriela Eves, PT, DPT 01/27/2019, 12:29 PM  [facility] [address] [place], [place], [address] Phone: [phone]   Fax:  [phone]  Name: AALAYSIA LIGGINS MRN: [id number] Date of Birth: [birth year]  DDD (degenerative disc disease), lumbar 03/28/2012  . Chronic knee pain 03/28/2012  . Obesity 03/28/2012  . Tobacco user 03/28/2012  . CAD (coronary artery disease) 03/28/2012     Gabriela Eves, PT, DPT 01/27/2019, 12:29 PM  [facility] [address] [place], [place], [address] Phone: [phone]   Fax:  [phone]  Name: AALAYSIA LIGGINS MRN: [id number] Date of Birth: [birth year]

## 2019-01-29 ENCOUNTER — Other Ambulatory Visit: Payer: Self-pay | Admitting: Family Medicine

## 2019-01-29 ENCOUNTER — Encounter: Payer: Medicare HMO | Admitting: Physical Therapy

## 2019-01-31 ENCOUNTER — Ambulatory Visit: Payer: Medicare HMO | Admitting: Physical Therapy

## 2019-01-31 ENCOUNTER — Encounter: Payer: Self-pay | Admitting: Physical Therapy

## 2019-01-31 ENCOUNTER — Other Ambulatory Visit: Payer: Self-pay

## 2019-01-31 DIAGNOSIS — M25562 Pain in left knee: Secondary | ICD-10-CM | POA: Diagnosis not present

## 2019-01-31 DIAGNOSIS — R262 Difficulty in walking, not elsewhere classified: Secondary | ICD-10-CM

## 2019-01-31 DIAGNOSIS — M25662 Stiffness of left knee, not elsewhere classified: Secondary | ICD-10-CM | POA: Diagnosis not present

## 2019-01-31 DIAGNOSIS — R293 Abnormal posture: Secondary | ICD-10-CM | POA: Diagnosis not present

## 2019-01-31 NOTE — Therapy (Signed)
pain of left knee   3. Difficulty in walking, not elsewhere classified   4. Abnormal posture        Problem List Patient Active Problem List   Diagnosis Date Noted  . Failed total knee, left, initial encounter ([place]) 01/09/2019  . Anemia 12/31/2015  . Arthritis of knee, degenerative 03/12/2015  . Arthritis of left knee   . Gout 09/07/2014  . Back pain with left-sided radiculopathy 09/07/2014  . Type 2 diabetes mellitus with diabetic neuropathy ([place]) 02/14/2014  . Hyperlipidemia 03/12/2013  . IBS (irritable bowel syndrome) 01/09/2013  . Diabetic neuropathy ([place]) 11/14/2012  . Diarrhea 09/13/2012  . Major depression, single episode 09/13/2012  . Essential hypertension, benign 06/09/2012  . DDD (degenerative disc disease), lumbar 03/28/2012  . Chronic knee pain 03/28/2012  . Obesity 03/28/2012  . Tobacco user 03/28/2012  . CAD (coronary artery disease) 03/28/2012    Standley Brooking, PTA 01/31/2019, 12:17 PM  [facility] [address] [place], [place], [address] Phone: [phone]   Fax:  [phone]  Name: Stephanie Hammond MRN: [id number] Date of Birth:  03-14-1951  [facility] [place], [place], [address] Phone: [phone]   Fax:  [phone]  Physical Therapy Treatment  Patient Details  Name: Stephanie Hammond MRN: [id number] Date of Birth: 1951/03/07 Referring Provider (PT): Rod Can, MD   Encounter Date: 01/31/2019  PT End of Session - 01/31/19 1135    Visit Number  7    Number of Visits  18    Date for PT Re-Evaluation  03/05/19    Authorization Type  FOTO; progress note every 10th visit; KX modifier after 15th visit    PT Start Time  1115    PT Stop Time  1201    PT Time Calculation (min)  46 min    Equipment Utilized During Treatment  Other (comment)    Activity Tolerance  Patient tolerated treatment well    Behavior During Therapy  [facility] for tasks assessed/performed       Past Medical History:  Diagnosis Date  . Anemia   . Blood transfusion 2009   after GI bleed  . CAD (coronary artery disease)   . Diabetes mellitus    2  . Diverticulitis   . Diverticulosis   . Fear of local anesthetic    01-02-2019, patient states with last surgery in 2016 , she refused to have epidural before she was sedated d/t to extreme aversion to needles near her back    . GERD (gastroesophageal reflux disease)   . Gout   . Headache    occ if sugar is  too hi or low   . Heart murmur    "i was told this years and years and years ago "  . History of GI diverticular bleed 08/2004  . Hypertension   . IBS (irritable bowel syndrome)   . Osteoarthritis   . Tachycardia    "it stays like that for years between 119 and 121 and it doesnt bother me  none "     Past Surgical History:  Procedure Laterality Date  . CHOLECYSTECTOMY  2003  . COLONOSCOPY  2000   Dr. Fuller Plan: marked diverticulosis, difficult procedure due to adhesions, polyps benign  . COLONOSCOPY  2006   Dr. Henrene Pastor: severe pandiverticulosis  . COLONOSCOPY N/A 02/03/2013   SLF: 4 COLORECTAL POLYPS REMOVED/Moderate diverticulosis  throughout the entire examined colon/Small internal hemorrhoids  . JOINT REPLACEMENT Right 2009   Knee Replacement Right   . PTCA     over 10 years ago , unsure of exact year , does recall that no stents were placed   . REPLACEMENT TOTAL KNEE     right   . TOTAL KNEE ARTHROPLASTY Left 03/12/2015   Procedure: LEFT TOTAL KNEE REPLACEMENT;  Surgeon: Carole Civil, MD;  Location: [facility];  Service: Orthopedics;  Laterality: Left;  . TOTAL KNEE REVISION Left 01/09/2019   Procedure: TOTAL KNEE REVISION;  Surgeon: Rod Can, MD;  Location: [facility];  Service: Orthopedics;  Laterality: Left;  [name] VAGINAL HYSTERECTOMY      There were no vitals filed for this visit.  Subjective Assessment - 01/31/19 1124    Subjective  COVID 19 screening performed on patient upon arrival. Reports that her knee feels much better now that the staples are out. States that the MD wants her to get away from using the [place].    Pertinent History  left TKA revision 01/09/2019; R TKA 2009; lumbar DDD, HTN, DM    Limitations  Sitting;Standing;Walking;House hold activities    Currently in Pain?  Yes  [facility] [place], [place], [address] Phone: [phone]   Fax:  [phone]  Physical Therapy Treatment  Patient Details  Name: Stephanie Hammond MRN: [id number] Date of Birth: 1951/03/07 Referring Provider (PT): Rod Can, MD   Encounter Date: 01/31/2019  PT End of Session - 01/31/19 1135    Visit Number  7    Number of Visits  18    Date for PT Re-Evaluation  03/05/19    Authorization Type  FOTO; progress note every 10th visit; KX modifier after 15th visit    PT Start Time  1115    PT Stop Time  1201    PT Time Calculation (min)  46 min    Equipment Utilized During Treatment  Other (comment)    Activity Tolerance  Patient tolerated treatment well    Behavior During Therapy  [facility] for tasks assessed/performed       Past Medical History:  Diagnosis Date  . Anemia   . Blood transfusion 2009   after GI bleed  . CAD (coronary artery disease)   . Diabetes mellitus    2  . Diverticulitis   . Diverticulosis   . Fear of local anesthetic    01-02-2019, patient states with last surgery in 2016 , she refused to have epidural before she was sedated d/t to extreme aversion to needles near her back    . GERD (gastroesophageal reflux disease)   . Gout   . Headache    occ if sugar is  too hi or low   . Heart murmur    "i was told this years and years and years ago "  . History of GI diverticular bleed 08/2004  . Hypertension   . IBS (irritable bowel syndrome)   . Osteoarthritis   . Tachycardia    "it stays like that for years between 119 and 121 and it doesnt bother me  none "     Past Surgical History:  Procedure Laterality Date  . CHOLECYSTECTOMY  2003  . COLONOSCOPY  2000   Dr. Fuller Plan: marked diverticulosis, difficult procedure due to adhesions, polyps benign  . COLONOSCOPY  2006   Dr. Henrene Pastor: severe pandiverticulosis  . COLONOSCOPY N/A 02/03/2013   SLF: 4 COLORECTAL POLYPS REMOVED/Moderate diverticulosis  throughout the entire examined colon/Small internal hemorrhoids  . JOINT REPLACEMENT Right 2009   Knee Replacement Right   . PTCA     over 10 years ago , unsure of exact year , does recall that no stents were placed   . REPLACEMENT TOTAL KNEE     right   . TOTAL KNEE ARTHROPLASTY Left 03/12/2015   Procedure: LEFT TOTAL KNEE REPLACEMENT;  Surgeon: Carole Civil, MD;  Location: [facility];  Service: Orthopedics;  Laterality: Left;  . TOTAL KNEE REVISION Left 01/09/2019   Procedure: TOTAL KNEE REVISION;  Surgeon: Rod Can, MD;  Location: [facility];  Service: Orthopedics;  Laterality: Left;  [name] VAGINAL HYSTERECTOMY      There were no vitals filed for this visit.  Subjective Assessment - 01/31/19 1124    Subjective  COVID 19 screening performed on patient upon arrival. Reports that her knee feels much better now that the staples are out. States that the MD wants her to get away from using the [place].    Pertinent History  left TKA revision 01/09/2019; R TKA 2009; lumbar DDD, HTN, DM    Limitations  Sitting;Standing;Walking;House hold activities    Currently in Pain?  Yes  pain of left knee   3. Difficulty in walking, not elsewhere classified   4. Abnormal posture        Problem List Patient Active Problem List   Diagnosis Date Noted  . Failed total knee, left, initial encounter ([place]) 01/09/2019  . Anemia 12/31/2015  . Arthritis of knee, degenerative 03/12/2015  . Arthritis of left knee   . Gout 09/07/2014  . Back pain with left-sided radiculopathy 09/07/2014  . Type 2 diabetes mellitus with diabetic neuropathy ([place]) 02/14/2014  . Hyperlipidemia 03/12/2013  . IBS (irritable bowel syndrome) 01/09/2013  . Diabetic neuropathy ([place]) 11/14/2012  . Diarrhea 09/13/2012  . Major depression, single episode 09/13/2012  . Essential hypertension, benign 06/09/2012  . DDD (degenerative disc disease), lumbar 03/28/2012  . Chronic knee pain 03/28/2012  . Obesity 03/28/2012  . Tobacco user 03/28/2012  . CAD (coronary artery disease) 03/28/2012    Standley Brooking, PTA 01/31/2019, 12:17 PM  [facility] [address] [place], [place], [address] Phone: [phone]   Fax:  [phone]  Name: Stephanie Hammond MRN: [id number] Date of Birth:  03-14-1951

## 2019-02-04 ENCOUNTER — Ambulatory Visit: Payer: Medicare HMO | Admitting: Physical Therapy

## 2019-02-04 ENCOUNTER — Other Ambulatory Visit: Payer: Self-pay

## 2019-02-04 ENCOUNTER — Encounter: Payer: Self-pay | Admitting: Physical Therapy

## 2019-02-04 DIAGNOSIS — M25662 Stiffness of left knee, not elsewhere classified: Secondary | ICD-10-CM | POA: Diagnosis not present

## 2019-02-04 DIAGNOSIS — R262 Difficulty in walking, not elsewhere classified: Secondary | ICD-10-CM

## 2019-02-04 DIAGNOSIS — R293 Abnormal posture: Secondary | ICD-10-CM

## 2019-02-04 DIAGNOSIS — M25562 Pain in left knee: Secondary | ICD-10-CM

## 2019-02-04 NOTE — Therapy (Signed)
[facility] [address] [place], [place], [address] Phone: [phone]   Fax:  [phone]  Physical Therapy Treatment  Patient Details  Name: Stephanie Hammond MRN: [id number] Date of Birth: 1951-01-18 Referring Provider (PT): Samson Frederic, MD   Encounter Date: 02/04/2019  PT End of Session - 02/04/19 1402    Visit Number  8    Number of Visits  18    Date for PT Re-Evaluation  03/05/19    Authorization Type  FOTO; progress note every 10th visit; KX modifier after 15th visit    PT Start Time  1359    PT Stop Time  1448    PT Time Calculation (min)  49 min    Activity Tolerance  Patient tolerated treatment well    Behavior During Therapy  [facility] for tasks assessed/performed       Past Medical History:  Diagnosis Date  . Anemia   . Blood transfusion 2009   after GI bleed  . CAD (coronary artery disease)   . Diabetes mellitus    2  . Diverticulitis   . Diverticulosis   . Fear of local anesthetic    01-02-2019, patient states with last surgery in 2016 , she refused to have epidural before she was sedated d/t to extreme aversion to needles near her back    . GERD (gastroesophageal reflux disease)   . Gout   . Headache    occ if sugar is  too hi or low   . Heart murmur    "i was told this years and years and years ago "  . History of GI diverticular bleed 08/2004  . Hypertension   . IBS (irritable bowel syndrome)   . Osteoarthritis   . Tachycardia    "it stays like that for years between 119 and 121 and it doesnt bother me  none "     Past Surgical History:  Procedure Laterality Date  . CHOLECYSTECTOMY  2003  . COLONOSCOPY  2000   Dr. Russella Dar: marked diverticulosis, difficult procedure due to adhesions, polyps benign  . COLONOSCOPY  2006   Dr. Marina Goodell: severe pandiverticulosis  . COLONOSCOPY N/A 02/03/2013   SLF: 4 COLORECTAL POLYPS REMOVED/Moderate diverticulosis throughout the entire examined colon/Small internal  hemorrhoids  . JOINT REPLACEMENT Right 2009   Knee Replacement Right   . PTCA     over 10 years ago , unsure of exact year , does recall that no stents were placed   . REPLACEMENT TOTAL KNEE     right   . TOTAL KNEE ARTHROPLASTY Left 03/12/2015   Procedure: LEFT TOTAL KNEE REPLACEMENT;  Surgeon: Vickki Hearing, MD;  Location: [facility];  Service: Orthopedics;  Laterality: Left;  . TOTAL KNEE REVISION Left 01/09/2019   Procedure: TOTAL KNEE REVISION;  Surgeon: Samson Frederic, MD;  Location: [facility];  Service: Orthopedics;  Laterality: Left;  [name] VAGINAL HYSTERECTOMY      There were no vitals filed for this visit.  Subjective Assessment - 02/04/19 1400    Subjective  COVID 19 screening performed on patient upon arrival. Patient reports that she could not find her cane and had to use her FWW again. Not confident enough to not use AD. Reports that she went to [employer] over the weekend and walked around with FWW but had increased pain from walking around on concrete floors.    Pertinent History  left TKA revision 01/09/2019; R TKA 2009; lumbar DDD, HTN, DM    Limitations  Sitting;Standing;Walking;House hold activities    How long can you sit comfortably?  short periods before pain starts    How long can you stand comfortably?  short periods    How long can you walk comfortably?  household ambulation    Diagnostic tests  n/a    Patient Stated Goals  Get out of pain walk without an AD    Currently in Pain?  Yes    Pain Score  4     Pain Location  Knee    Pain Orientation  Left    Pain Descriptors / Indicators  Discomfort    Pain Type  Surgical pain    Pain Onset  1 to 4 weeks ago    Pain Frequency  Intermittent         [facility] PT Assessment - 02/04/19 0001      Assessment   Medical Diagnosis  Left TKA Revision    Referring Provider (PT)  Samson Frederic, MD    Onset Date/Surgical Date  01/09/19    Next MD Visit  02/25/2019    Prior Therapy  Yes      Precautions   Precautions  Other  (comment)    Precaution Comments  no ultrasound      Restrictions   Weight Bearing Restrictions  No      ROM / Strength   AROM / PROM / Strength  AROM      AROM   Overall AROM   Deficits    AROM Assessment Site  Knee    Right/Left Knee  Left    Left Knee Extension  15    Left Knee Flexion  106                   [facility] Adult PT Treatment/Exercise - 02/04/19 0001      Knee/Hip Exercises: Aerobic   Nustep  L4, seat 10 x10 min      Knee/Hip Exercises: Seated   Long Arc Quad  Strengthening;Left;20 reps;Weights    Long Arc Quad Weight  4 lbs.      Knee/Hip Exercises: Supine   Heel Slides  AROM;Left;15 reps   limited by LBP     Modalities   Modalities  Electrical Stimulation;Vasopneumatic      Programme researcher, broadcasting/film/video Location  L knee    Electrical Stimulation Action  IFC    Electrical Stimulation Parameters  80-150 hz x13 min   requested to stop early secondary to discomfort   Electrical Stimulation Goals  Edema;Pain      Vasopneumatic   Number Minutes Vasopneumatic   15 minutes    Vasopnuematic Location   Knee    Vasopneumatic Pressure  Medium    Vasopneumatic Temperature   34      Manual Therapy   Manual Therapy  Passive ROM;Soft tissue mobilization    Soft tissue mobilization  STW to L distal ITB to reduce muscle tightness and pain    Passive ROM  PROM of L knee into flexion with gentle holds at end range               PT Short Term Goals - 01/31/19 1217      PT SHORT TERM GOAL #1   Title  Patient will be independent with HEP    Time  3    Period  Weeks    Status  On-going      PT SHORT TERM GOAL #2   Title  Patient will demonstrate  90+ degrees of left knee flexion AROM to improve ability to perform ADLs    Time  6    Period  Weeks    Status  Achieved      PT SHORT TERM GOAL #3   Title  Patient will demonstrate 5 degrees of left knee extension AROM to improve gait mechanics.    Time  6    Period  Weeks     Status  On-going        PT Long Term Goals - 01/31/19 1216      PT LONG TERM GOAL #1   Title  Patient will be independent with advanced HEP    Time  6    Period  Weeks    Status  On-going      PT LONG TERM GOAL #2   Title  Patient will demonstrate 115+ degrees of left knee flexion AROM to improve ability to perform functional tasks.    Time  6    Period  Weeks    Status  On-going      PT LONG TERM GOAL #3   Title  Patient will demonstrate 3 degrees or less of left knee extension AROM to improve gait mechanics.    Time  6    Period  Weeks    Status  On-going      PT LONG TERM GOAL #4   Title  Patient will report ability to negotiate 2 steps with reciprocating pattern and no railing to safely enter and exit her home.    Time  6    Period  Weeks    Status  On-going      PT LONG TERM GOAL #5   Title  Patient will report ability to perform ADLs with left knee pain less than or equal to 4/10.    Time  6    Period  Weeks    Status  On-going            Plan - 02/04/19 1439    Clinical Impression Statement  Patient presented in clinic with reports of low level pain but stiffness as well. Patient able to complete therex well but limited with supine activities due to chronic LBP. Patient did report more lateral L knee pain and soreness to palpation along distal L ITB. AROM of L knee measured as 15-106 deg. Patient encouraged to rest at home with pillow folded under her L foot for emphasize extension. Normal modalities response noted following removal of the modalities. No adverse integumentary effects noted after stimulation electrodes removed.    Personal Factors and Comorbidities  Comorbidity 2    Comorbidities  HTN, DM    Examination-Activity Limitations  Stairs;Stand;Sit    Stability/Clinical Decision Making  Stable/Uncomplicated    Rehab Potential  Good    PT Frequency  3x / week    PT Duration  6 weeks    PT Treatment/Interventions  ADLs/Self Care Home  Management;Cryotherapy;Moist Heat;Therapeutic activities;Therapeutic exercise;Patient/family education;Manual techniques;Electrical Stimulation;Balance training;Neuromuscular re-education;Vasopneumatic Device;Passive range of motion;Gait training;Stair training    PT Next Visit Plan  Nustep, emphasis on knee extension, left knee PROM, Modalities PRN for pain relief    PT Home Exercise Plan  see patient education section    Consulted and Agree with Plan of Care  Patient       Patient will benefit from skilled therapeutic intervention in order to improve the following deficits and impairments:  Pain, Decreased range of motion, Decreased strength, Decreased activity tolerance, Difficulty walking  Visit Diagnosis: 1. Stiffness of left knee, not elsewhere classified   2. Acute pain of left knee   3. Difficulty in walking, not elsewhere classified   4. Abnormal posture        Problem List Patient Active Problem List   Diagnosis Date Noted  . Failed total knee, left, initial encounter ([facility]) 01/09/2019  . Anemia 12/31/2015  . Arthritis of knee, degenerative 03/12/2015  . Arthritis of left knee   . Gout 09/07/2014  . Back pain with left-sided radiculopathy 09/07/2014  . Type 2 diabetes mellitus with diabetic neuropathy ([facility]) 02/14/2014  . Hyperlipidemia 03/12/2013  . IBS (irritable bowel syndrome) 01/09/2013  . Diabetic neuropathy ([facility]) 11/14/2012  . Diarrhea 09/13/2012  . Major depression, single episode 09/13/2012  . Essential hypertension, benign 06/09/2012  . DDD (degenerative disc disease), lumbar 03/28/2012  . Chronic knee pain 03/28/2012  . Obesity 03/28/2012  . Tobacco user 03/28/2012  . CAD (coronary artery disease) 03/28/2012    Marvell Fuller, PTA 02/04/2019, 3:13 PM  [facility] [address] [place], [place], [address] Phone: [phone]   Fax:  [phone]  Name: Stephanie Hammond MRN: [id number] Date of Birth:  Aug 05, 1951

## 2019-02-06 ENCOUNTER — Other Ambulatory Visit: Payer: Self-pay

## 2019-02-06 ENCOUNTER — Ambulatory Visit: Payer: Medicare HMO | Attending: Orthopedic Surgery | Admitting: Physical Therapy

## 2019-02-06 DIAGNOSIS — R262 Difficulty in walking, not elsewhere classified: Secondary | ICD-10-CM | POA: Insufficient documentation

## 2019-02-06 DIAGNOSIS — M25662 Stiffness of left knee, not elsewhere classified: Secondary | ICD-10-CM | POA: Diagnosis not present

## 2019-02-06 DIAGNOSIS — M25562 Pain in left knee: Secondary | ICD-10-CM | POA: Diagnosis not present

## 2019-02-06 NOTE — Therapy (Signed)
at the superior to her left knee incisional site likely due to scar tissue.       Patient will benefit from skilled therapeutic intervention in order to improve the following deficits and impairments:     Visit Diagnosis: 1. Stiffness of left knee, not elsewhere classified   2. Acute pain of left knee        Problem List Patient Active Problem List   Diagnosis Date Noted  . Failed total knee, left, initial encounter ([place]) 01/09/2019  . Anemia  12/31/2015  . Arthritis of knee, degenerative 03/12/2015  . Arthritis of left knee   . Gout 09/07/2014  . Back pain with left-sided radiculopathy 09/07/2014  . Type 2 diabetes mellitus with diabetic neuropathy ([place]) 02/14/2014  . Hyperlipidemia 03/12/2013  . IBS (irritable bowel syndrome) 01/09/2013  . Diabetic neuropathy ([place]) 11/14/2012  . Diarrhea 09/13/2012  . Major depression, single episode 09/13/2012  . Essential hypertension, benign 06/09/2012  . DDD (degenerative disc disease), lumbar 03/28/2012  . Chronic knee pain 03/28/2012  . Obesity 03/28/2012  . Tobacco user 03/28/2012  . CAD (coronary artery disease) 03/28/2012    Stephanie Hammond, Stephanie Hammond 02/06/2019, 2:55 PM  [facility] [address] [place], [place], [address] Phone: [phone]   Fax:  [phone]  Name: Stephanie Hammond MRN: [id number] Date of Birth: July 09, 1951  at the superior to her left knee incisional site likely due to scar tissue.       Patient will benefit from skilled therapeutic intervention in order to improve the following deficits and impairments:     Visit Diagnosis: 1. Stiffness of left knee, not elsewhere classified   2. Acute pain of left knee        Problem List Patient Active Problem List   Diagnosis Date Noted  . Failed total knee, left, initial encounter ([place]) 01/09/2019  . Anemia  12/31/2015  . Arthritis of knee, degenerative 03/12/2015  . Arthritis of left knee   . Gout 09/07/2014  . Back pain with left-sided radiculopathy 09/07/2014  . Type 2 diabetes mellitus with diabetic neuropathy ([place]) 02/14/2014  . Hyperlipidemia 03/12/2013  . IBS (irritable bowel syndrome) 01/09/2013  . Diabetic neuropathy ([place]) 11/14/2012  . Diarrhea 09/13/2012  . Major depression, single episode 09/13/2012  . Essential hypertension, benign 06/09/2012  . DDD (degenerative disc disease), lumbar 03/28/2012  . Chronic knee pain 03/28/2012  . Obesity 03/28/2012  . Tobacco user 03/28/2012  . CAD (coronary artery disease) 03/28/2012    Stephanie Hammond, Stephanie Hammond 02/06/2019, 2:55 PM  [facility] [address] [place], [place], [address] Phone: [phone]   Fax:  [phone]  Name: Stephanie Hammond MRN: [id number] Date of Birth: July 09, 1951  [facility] [place], [place], [address] Phone: [phone]   Fax:  [phone]  Physical Therapy Treatment  Patient Details  Name: Stephanie Hammond MRN: [id number] Date of Birth: 03-Oct-1950 Referring Provider (PT): Rod Can, MD   Encounter Date: 02/06/2019  PT End of Session - 02/06/19 1436    Visit Number  9    Number of Visits  18    Date for PT Re-Evaluation  03/05/19    Authorization Type  FOTO; progress note every 10th visit; KX modifier after 15th visit    PT Start Time  0200    PT Stop Time  0246    PT Time Calculation (min)  46 min    Activity Tolerance  Patient tolerated treatment well    Behavior During Therapy  [facility] for tasks assessed/performed       Past Medical History:  Diagnosis Date  . Anemia   . Blood transfusion 2009   after GI bleed  . CAD (coronary artery disease)   . Diabetes mellitus    2  . Diverticulitis   . Diverticulosis   . Fear of local anesthetic    01-02-2019, patient states with last surgery in 2016 , she refused to have epidural before she was sedated d/t to extreme aversion to needles near her back    . GERD (gastroesophageal reflux disease)   . Gout   . Headache    occ if sugar is  too hi or low   . Heart murmur    "i was told this years and years and years ago "  . History of GI diverticular bleed 08/2004  . Hypertension   . IBS (irritable bowel syndrome)   . Osteoarthritis   . Tachycardia    "it stays like that for years between 119 and 121 and it doesnt bother me  none "     Past Surgical History:  Procedure Laterality Date  . CHOLECYSTECTOMY  2003  . COLONOSCOPY  2000   Dr. Fuller Plan: marked diverticulosis, difficult procedure due to adhesions, polyps benign  . COLONOSCOPY  2006   Dr. Henrene Pastor: severe pandiverticulosis  . COLONOSCOPY N/A 02/03/2013   SLF: 4 COLORECTAL POLYPS REMOVED/Moderate diverticulosis throughout the entire examined colon/Small internal  hemorrhoids  . JOINT REPLACEMENT Right 2009   Knee Replacement Right   . PTCA     over 10 years ago , unsure of exact year , does recall that no stents were placed   . REPLACEMENT TOTAL KNEE     right   . TOTAL KNEE ARTHROPLASTY Left 03/12/2015   Procedure: LEFT TOTAL KNEE REPLACEMENT;  Surgeon: Carole Civil, MD;  Location: [facility];  Service: Orthopedics;  Laterality: Left;  . TOTAL KNEE REVISION Left 01/09/2019   Procedure: TOTAL KNEE REVISION;  Surgeon: Rod Can, MD;  Location: [facility];  Service: Orthopedics;  Laterality: Left;  [name] VAGINAL HYSTERECTOMY      There were no vitals filed for this visit.  Subjective Assessment - 02/06/19 1435    Subjective  COVID-19 screen performed prior to patient entering clinic.  No new complaints.    Pertinent History  left TKA revision 01/09/2019; R TKA 2009; lumbar DDD, HTN, DM    Currently in Pain?  Yes    Pain Score  4                        [facility] Adult PT Treatment/Exercise - 02/06/19 0001

## 2019-02-11 ENCOUNTER — Other Ambulatory Visit: Payer: Self-pay | Admitting: *Deleted

## 2019-02-11 ENCOUNTER — Ambulatory Visit: Payer: Medicare HMO | Admitting: Physical Therapy

## 2019-02-11 ENCOUNTER — Other Ambulatory Visit: Payer: Self-pay

## 2019-02-11 DIAGNOSIS — R262 Difficulty in walking, not elsewhere classified: Secondary | ICD-10-CM | POA: Diagnosis not present

## 2019-02-11 DIAGNOSIS — M25662 Stiffness of left knee, not elsewhere classified: Secondary | ICD-10-CM | POA: Diagnosis not present

## 2019-02-11 DIAGNOSIS — M25562 Pain in left knee: Secondary | ICD-10-CM

## 2019-02-11 NOTE — Therapy (Addendum)
[facility] [place], [place], [address] Phone: [phone]   Fax:  [phone]  Physical Therapy Treatment  Patient Details  Name: Stephanie Hammond MRN: [id number] Date of Birth: 1950/08/17 Referring Provider (PT): Rod Can, MD   Encounter Date: 02/11/2019  PT End of Session - 02/11/19 1101    Visit Number  10    Number of Visits  18    Date for PT Re-Evaluation  03/05/19    Authorization Type  FOTO; progress note every 10th visit; KX modifier after 15th visit    PT Start Time  0953    PT Stop Time  1039    PT Time Calculation (min)  46 min    Activity Tolerance  Patient tolerated treatment well    Behavior During Therapy  [facility] for tasks assessed/performed       Past Medical History:  Diagnosis Date  . Anemia   . Blood transfusion 2009   after GI bleed  . CAD (coronary artery disease)   . Diabetes mellitus    2  . Diverticulitis   . Diverticulosis   . Fear of local anesthetic    01-02-2019, patient states with last surgery in 2016 , she refused to have epidural before she was sedated d/t to extreme aversion to needles near her back    . GERD (gastroesophageal reflux disease)   . Gout   . Headache    occ if sugar is  too hi or low   . Heart murmur    "i was told this years and years and years ago "  . History of GI diverticular bleed 08/2004  . Hypertension   . IBS (irritable bowel syndrome)   . Osteoarthritis   . Tachycardia    "it stays like that for years between 119 and 121 and it doesnt bother me  none "     Past Surgical History:  Procedure Laterality Date  . CHOLECYSTECTOMY  2003  . COLONOSCOPY  2000   Dr. Fuller Plan: marked diverticulosis, difficult procedure due to adhesions, polyps benign  . COLONOSCOPY  2006   Dr. Henrene Pastor: severe pandiverticulosis  . COLONOSCOPY N/A 02/03/2013   SLF: 4 COLORECTAL POLYPS REMOVED/Moderate diverticulosis throughout the entire examined colon/Small internal  hemorrhoids  . JOINT REPLACEMENT Right 2009   Knee Replacement Right   . PTCA     over 10 years ago , unsure of exact year , does recall that no stents were placed   . REPLACEMENT TOTAL KNEE     right   . TOTAL KNEE ARTHROPLASTY Left 03/12/2015   Procedure: LEFT TOTAL KNEE REPLACEMENT;  Surgeon: Carole Civil, MD;  Location: [facility];  Service: Orthopedics;  Laterality: Left;  . TOTAL KNEE REVISION Left 01/09/2019   Procedure: TOTAL KNEE REVISION;  Surgeon: Rod Can, MD;  Location: [facility];  Service: Orthopedics;  Laterality: Left;  [name] VAGINAL HYSTERECTOMY      There were no vitals filed for this visit.  Subjective Assessment - 02/11/19 1103    Subjective  COVID-19 screen performed prior to patient entering clinic.  No new complaints.  I had a great weekend and was real busy and my knee felt great.    Pertinent History  left TKA revision 01/09/2019; R TKA 2009; lumbar DDD, HTN, DM    Limitations  Sitting;Standing;Walking;House hold activities    How long can you sit comfortably?  short periods before pain starts    How long can you walk comfortably?  [facility] [place], [place], [address] Phone: [phone]   Fax:  [phone]  Physical Therapy Treatment  Patient Details  Name: Stephanie Hammond MRN: [id number] Date of Birth: 1950/08/17 Referring Provider (PT): Rod Can, MD   Encounter Date: 02/11/2019  PT End of Session - 02/11/19 1101    Visit Number  10    Number of Visits  18    Date for PT Re-Evaluation  03/05/19    Authorization Type  FOTO; progress note every 10th visit; KX modifier after 15th visit    PT Start Time  0953    PT Stop Time  1039    PT Time Calculation (min)  46 min    Activity Tolerance  Patient tolerated treatment well    Behavior During Therapy  [facility] for tasks assessed/performed       Past Medical History:  Diagnosis Date  . Anemia   . Blood transfusion 2009   after GI bleed  . CAD (coronary artery disease)   . Diabetes mellitus    2  . Diverticulitis   . Diverticulosis   . Fear of local anesthetic    01-02-2019, patient states with last surgery in 2016 , she refused to have epidural before she was sedated d/t to extreme aversion to needles near her back    . GERD (gastroesophageal reflux disease)   . Gout   . Headache    occ if sugar is  too hi or low   . Heart murmur    "i was told this years and years and years ago "  . History of GI diverticular bleed 08/2004  . Hypertension   . IBS (irritable bowel syndrome)   . Osteoarthritis   . Tachycardia    "it stays like that for years between 119 and 121 and it doesnt bother me  none "     Past Surgical History:  Procedure Laterality Date  . CHOLECYSTECTOMY  2003  . COLONOSCOPY  2000   Dr. Fuller Plan: marked diverticulosis, difficult procedure due to adhesions, polyps benign  . COLONOSCOPY  2006   Dr. Henrene Pastor: severe pandiverticulosis  . COLONOSCOPY N/A 02/03/2013   SLF: 4 COLORECTAL POLYPS REMOVED/Moderate diverticulosis throughout the entire examined colon/Small internal  hemorrhoids  . JOINT REPLACEMENT Right 2009   Knee Replacement Right   . PTCA     over 10 years ago , unsure of exact year , does recall that no stents were placed   . REPLACEMENT TOTAL KNEE     right   . TOTAL KNEE ARTHROPLASTY Left 03/12/2015   Procedure: LEFT TOTAL KNEE REPLACEMENT;  Surgeon: Carole Civil, MD;  Location: [facility];  Service: Orthopedics;  Laterality: Left;  . TOTAL KNEE REVISION Left 01/09/2019   Procedure: TOTAL KNEE REVISION;  Surgeon: Rod Can, MD;  Location: [facility];  Service: Orthopedics;  Laterality: Left;  [name] VAGINAL HYSTERECTOMY      There were no vitals filed for this visit.  Subjective Assessment - 02/11/19 1103    Subjective  COVID-19 screen performed prior to patient entering clinic.  No new complaints.  I had a great weekend and was real busy and my knee felt great.    Pertinent History  left TKA revision 01/09/2019; R TKA 2009; lumbar DDD, HTN, DM    Limitations  Sitting;Standing;Walking;House hold activities    How long can you sit comfortably?  short periods before pain starts    How long can you walk comfortably?  with left knee pain less than or equal to 4/10.    Time  6    Period  Weeks    Status  On-going            Plan - 02/11/19 1105    Clinical Impression Statement  Please see "Therapy Section" please.    PT Next Visit Plan  Nustep, emphasis on knee extension, left knee PROM, Modalities PRN for pain relief    PT Home Exercise Plan  see patient education section       Patient will benefit from skilled therapeutic intervention in  order to improve the following deficits and impairments:  Pain, Decreased range of motion, Decreased strength, Decreased activity tolerance, Difficulty walking  Visit Diagnosis: 1. Stiffness of left knee, not elsewhere classified   2. Acute pain of left knee   3. Difficulty in walking, not elsewhere classified        Problem List Patient Active Problem List   Diagnosis Date Noted  . Failed total knee, left, initial encounter ([place]) 01/09/2019  . Anemia 12/31/2015  . Arthritis of knee, degenerative 03/12/2015  . Arthritis of left knee   . Gout 09/07/2014  . Back pain with left-sided radiculopathy 09/07/2014  . Type 2 diabetes mellitus with diabetic neuropathy ([place]) 02/14/2014  . Hyperlipidemia 03/12/2013  . IBS (irritable bowel syndrome) 01/09/2013  . Diabetic neuropathy ([place]) 11/14/2012  . Diarrhea 09/13/2012  . Major depression, single episode 09/13/2012  . Essential hypertension, benign 06/09/2012  . DDD (degenerative disc disease), lumbar 03/28/2012  . Chronic knee pain 03/28/2012  . Obesity 03/28/2012  . Tobacco user 03/28/2012  . CAD (coronary artery disease) 03/28/2012    Progress Note Reporting Period 01/15/19 to 02/11/19  See note below for Objective Data and Assessment of Progress/Goals. Excellent progress toward goals with a FOTO score improving significantly to a 32% limitation.      Stephanie Hammond, [place] MPT 02/11/2019, 11:06 AM  [facility] [address] [place], [place], [address] Phone: [phone]   Fax:  [phone]  Name: Stephanie Hammond MRN: [id number] Date of Birth: 06/22/1951  PHYSICAL THERAPY DISCHARGE SUMMARY  Visits from Start of Care: 10.  Current functional level related to goals / functional outcomes: See above.   Remaining deficits: See below.   Education / Equipment: HEP. Plan: Patient agrees to discharge.  Patient goals were not met. Patient is being discharged due to not returning since  the last visit.  ?????         [place] Hailie Searight MPT

## 2019-02-11 NOTE — Telephone Encounter (Signed)
Received call from patient.   Requested refill on Oxycodone/APAP.   Ok to refill??  Last office visit 10/09/2018.  Last refill 01/13/2019.

## 2019-02-12 ENCOUNTER — Encounter: Payer: Medicare HMO | Admitting: Physical Therapy

## 2019-02-12 MED ORDER — OXYCODONE-ACETAMINOPHEN 7.5-325 MG PO TABS: 1.0000 | ORAL_TABLET | Freq: Four times a day (QID) | ORAL | 0 refills | Status: DC | PRN

## 2019-02-12 NOTE — Telephone Encounter (Signed)
Controlled substance database reviewed.  Narcotic pain meds refilled the same as recent refills from PCP who is not currently available in clinic this week.  Narcotic pain meds for chronic pain - per PCP

## 2019-02-14 ENCOUNTER — Encounter: Payer: Medicare HMO | Admitting: Physical Therapy

## 2019-02-17 ENCOUNTER — Ambulatory Visit: Payer: Medicare HMO | Admitting: Gastroenterology

## 2019-02-18 ENCOUNTER — Ambulatory Visit: Payer: Medicare HMO | Admitting: Physical Therapy

## 2019-02-19 ENCOUNTER — Encounter: Payer: Medicare HMO | Admitting: Physical Therapy

## 2019-02-20 ENCOUNTER — Encounter: Payer: Medicare HMO | Admitting: Physical Therapy

## 2019-02-25 DIAGNOSIS — Z96652 Presence of left artificial knee joint: Secondary | ICD-10-CM | POA: Diagnosis not present

## 2019-02-25 DIAGNOSIS — Z471 Aftercare following joint replacement surgery: Secondary | ICD-10-CM | POA: Diagnosis not present

## 2019-02-27 ENCOUNTER — Other Ambulatory Visit: Payer: Self-pay | Admitting: Family Medicine

## 2019-03-13 ENCOUNTER — Other Ambulatory Visit: Payer: Self-pay | Admitting: *Deleted

## 2019-03-13 NOTE — Telephone Encounter (Signed)
Received call from patient.   Requested refill on OOxycodone/APAP.  Ok to refill??  Last office visit 10/09/2018.  Last refill 02/12/2019.

## 2019-03-14 ENCOUNTER — Other Ambulatory Visit: Payer: Self-pay | Admitting: Family Medicine

## 2019-03-14 MED ORDER — OXYCODONE-ACETAMINOPHEN 7.5-325 MG PO TABS: 1.0000 | ORAL_TABLET | Freq: Four times a day (QID) | ORAL | 0 refills | Status: DC | PRN

## 2019-03-17 NOTE — Progress Notes (Addendum)
REVIEWED. LAST CT AUG 11: WNLS. SHOULD REVIEW WITH RADIOLOGY. SEE GYN FOR VAGINAL DISCHARGE. TCS FOR PERSONAL HISTORY OF POLYPS AT FIRST AVAILABLE PROPOFOL SLOT.  Primary Care Physician:  Alycia Rossetti, MD Primary Gastroenterologist:  Dr. Oneida Alar  Chief Complaint  Patient presents with   vaginal discomfort   vaginal leakage    dark, foul smelling    HPI:   Stephanie Hammond is a 68 y.o. female presenting today for evaluation of vaginal discharge and vaginal pain s/p recent diverticulitis. Last seen in our office in Jan 2017.  GI history significant for IBS and GERD.   Patient saw OBGYN for vaginal discharge and vaginal pain in April 2020. CT abdomen and pelvis on 12/05/18: Low-grade sigmoid diverticulitis. Difficult to completely rule out rectovaginal fistula; however, no current contrast medium or significant amount of gas in the vagina. some gas in the urinary bladder lumen. If the patient has had recent catheterization, that is the most likely cause. In the absence of recent catheterization, the possibility of an otherwise occult fistula to the urinary bladder might be considered. On 12/06/18, patient was prescribed cipro 500 BID and flagyl 500 bid x 10 days. In follow-up on 12/24/18, patient was doing much better. On exam, still with tenderness in the left upper vaginal corner. Patient advised to start MiraLAX daily or as needed and recommended colonoscopy after knee surgery. In the interim patient has total left knee revision on 01/09/19.   Today she states she has vaginal discharge that is fowl smelling. Having to wear a pad due to persistent discharge.  Also with vaginal pain. Sharp. Mostly constant. Pain and discharge has been present for a couple months. Doesn't remember being diagnosed with diverticulitis or completing antibiotics. States she has never had abdominal pain with any of this, always has been vaginal pain. Vaginal pain has not improved or worsened, has just persisted. Thinks  discharge may be increasing. States when she has a BM, "it about kills me." Pain is in the vaginal area when she has a BM.  Usually with "straining and squirming" in the morning. States, some days will have a good BM, maybe 2-3 days a week. Then later states she has loose stools after meals which has been ongoing for years. No watery stools. No nightly stools. No blood in the stool. Dark stools only after taking pepto bismol.   No breakthrough upper GI symptoms. No nausea/vomiting. No dysphagia.  No NSAIDs.  Last colonoscopy 02/03/13: 4 polyps removed. Moderate diverticulosis throughout entire colon. Small internal hemorrhoids. Pathology with tubular adenomas and one hyperplastic polyp. Repeat due in 2019.  Denies fever, chills, lightheadedness, dizziness, unintentional weight loss, chest pain, palpitations, shortness of breath, or cough.   Past Medical History:  Diagnosis Date   Anemia    Blood transfusion 2009   after GI bleed   CAD (coronary artery disease)    Diabetes mellitus    2   Diverticulitis    Diverticulosis    Fear of local anesthetic    01-02-2019, patient states with last surgery in 2016 , she refused to have epidural before she was sedated d/t to extreme aversion to needles near her back     GERD (gastroesophageal reflux disease)    Gout    Headache    occ if sugar is  too hi or low    Heart murmur    "i was told this years and years and years ago "   History of GI diverticular bleed 08/2004  Hypertension    IBS (irritable bowel syndrome)    Osteoarthritis    Tachycardia    "it stays like that for years between 119 and 121 and it doesnt bother me  none "     Past Surgical History:  Procedure Laterality Date   CHOLECYSTECTOMY  2003   COLONOSCOPY  2000   Dr. Fuller Plan: marked diverticulosis, difficult procedure due to adhesions, polyps benign   COLONOSCOPY  2006   Dr. Henrene Pastor: severe pandiverticulosis   COLONOSCOPY N/A 02/03/2013   SLF: 4 COLORECTAL  POLYPS REMOVED/Moderate diverticulosis throughout the entire examined colon/Small internal hemorrhoids   JOINT REPLACEMENT Right 2009   Knee Replacement Right    PTCA     over 10 years ago , unsure of exact year , does recall that no stents were placed    REPLACEMENT TOTAL KNEE     right    TOTAL KNEE ARTHROPLASTY Left 03/12/2015   Procedure: LEFT TOTAL KNEE REPLACEMENT;  Surgeon: Carole Civil, MD;  Location: [facility];  Service: Orthopedics;  Laterality: Left;   TOTAL KNEE REVISION Left 01/09/2019   Procedure: TOTAL KNEE REVISION;  Surgeon: Rod Can, MD;  Location: [facility];  Service: Orthopedics;  Laterality: Left;   VAGINAL HYSTERECTOMY      Current Outpatient Medications  Medication Sig Dispense Refill   acetaminophen (TYLENOL) 325 MG tablet Take 1-2 tablets (325-650 mg total) by mouth every 6 (six) hours as needed for mild pain (pain score 1-3 or temp > 100.5). 120 tablet 0   allopurinol (ZYLOPRIM) 300 MG tablet TAKE 1 TABLET BY MOUTH EVERY DAY (Patient taking differently: Take 300 mg by mouth daily. ) 90 tablet 1   amLODipine (NORVASC) 10 MG tablet TAKE 1 TABLET BY MOUTH EVERY DAY 90 tablet 1   atorvastatin (LIPITOR) 20 MG tablet TAKE 1 TABLET BY MOUTH EVERY DAY 90 tablet 3   B-D UF III MINI PEN NEEDLES 31G X 5 MM MISC INJECT 1 EACH INTO THE SKIN DAILY. USE AS DIRECTED TO INJECT INSULIN DAILY. 100 each 3   bismuth subsalicylate (PEPTO BISMOL) 262 MG/15ML suspension Take 5 mLs by mouth as needed.     glucose blood (ONE TOUCH ULTRA TEST) test strip USE AS DIRECTED TO TEST THREE TIMES DAILY 300 each 3   insulin glargine (LANTUS) 100 UNIT/ML injection Inject 12 Units into the skin at bedtime. Uses interchangeably with Tyler Aas if she cant get the Lantus     Lancets MISC Use as directed. 100 each 11   losartan (COZAAR) 50 MG tablet TAKE 2 TABLETS (100MG  TOTAL DOSE) BY MOUTH DAILY 180 tablet 1   metFORMIN (GLUCOPHAGE-XR) 500 MG 24 hr tablet TAKE 2 TABLETS(1000 MG) BY  MOUTH DAILY WITH BREAKFAST 180 tablet 2   omeprazole (PRILOSEC) 20 MG capsule TAKE 1 CAPSULE BY MOUTH EVERY DAY IN THE MORNING 90 capsule 0   oxyCODONE-acetaminophen (PERCOCET) 7.5-325 MG tablet Take 1 tablet by mouth every 6 (six) hours as needed for severe pain. 80 tablet 0   senna (SENOKOT) 8.6 MG TABS tablet Take 1 tablet (8.6 mg total) by mouth 2 (two) times daily. (Patient taking differently: Take 1 tablet by mouth as needed. ) 120 each 0   No current facility-administered medications for this visit.     Allergies as of 03/18/2019 - Review Complete 03/18/2019  Allergen Reaction Noted   Ace inhibitors Cough 08/15/2012   Bentyl [dicyclomine hcl] Other (See Comments) 02/03/2013   Penicillins Itching and Swelling 03/28/2011    Family History  Problem Relation Age of Onset   Colon polyps Sister    Cancer Mother        lung    Cancer Father        stomach    Stomach cancer Father        questionable   Colon cancer Neg Hx     Social History   Socioeconomic History   Marital status: Married    Spouse name: Not on file   Number of children: 2   Years of education: Not on file   Highest education level: Not on file  Occupational History   Not on file  Social Needs   Financial resource strain: Not on file   Food insecurity    Worry: Not on file    Inability: Not on file   Transportation needs    Medical: Not on file    Non-medical: Not on file  Tobacco Use   Smoking status: Current Every Day Smoker    Packs/day: 1.00    Years: 35.00    Pack years: 35.00    Types: Cigarettes   Smokeless tobacco: Never Used  Substance and Sexual Activity   Alcohol use: No   Drug use: No   Sexual activity: Not Currently    Birth control/protection: Surgical    Comment: hyst  Lifestyle   Physical activity    Days per week: Not on file    Minutes per session: Not on file   Stress: Not on file  Relationships   Social connections    Talks on phone: Not  on file    Gets together: Not on file    Attends religious service: Not on file    Active member of club or organization: Not on file    Attends meetings of clubs or organizations: Not on file    Relationship status: Not on file   Intimate partner violence    Fear of current or ex partner: Not on file    Emotionally abused: Not on file    Physically abused: Not on file    Forced sexual activity: Not on file  Other Topics Concern   Not on file  Social History Narrative   Not on file    Review of Systems: Gen: See HPI  CV: Denies peripheral edema Resp: See HPI GI: See HPI GU : Denies urinary burning, urinary frequency, urinary hesitancy Derm: Denies rash, itching, dry skin Psych: Denies depression, anxiety Heme: Denies bruising, bleeding  Physical Exam: BP 122/77    Pulse (!) 120    Temp (!) 96.6 F (35.9 C) (Temporal)    Ht 5\' 3"  (1.6 m)    Wt 196 lb 12.8 oz (89.3 kg)    BMI 34.86 kg/m  General:   Alert and oriented. Pleasant and cooperative. Well-nourished and well-developed.  Head:  Normocephalic and atraumatic. Eyes:  Without icterus, sclera clear and conjunctiva pink.  Ears:  Normal auditory acuity. Nose:  No deformity, discharge,  or lesions. Lungs:  Clear to auscultation bilaterally. No wheezes, rales, or rhonchi. No distress.  Heart:  S1, S2 present without murmurs appreciated.  Abdomen:  +BS, soft, non-tender and non-distended. No HSM noted. No guarding or rebound. No masses appreciated.  Rectal:  Deferred  Msk:  Symmetrical without gross deformities. Normal posture. Extremities:  Without edema. Neurologic:  Alert and  oriented x4;  grossly normal neurologically. Skin:  Intact without significant lesions or rashes. Psych:  Alert and cooperative. Normal mood and affect.

## 2019-03-18 ENCOUNTER — Ambulatory Visit (INDEPENDENT_AMBULATORY_CARE_PROVIDER_SITE_OTHER): Payer: Medicare HMO | Admitting: Gastroenterology

## 2019-03-18 ENCOUNTER — Encounter: Payer: Self-pay | Admitting: *Deleted

## 2019-03-18 ENCOUNTER — Telehealth: Payer: Self-pay | Admitting: *Deleted

## 2019-03-18 ENCOUNTER — Ambulatory Visit (HOSPITAL_COMMUNITY)
Admission: RE | Admit: 2019-03-18 | Discharge: 2019-03-18 | Disposition: A | Discharge status: Home / Self Care | Payer: Medicare HMO | Source: Ambulatory Visit | Attending: Gastroenterology | Admitting: Gastroenterology

## 2019-03-18 ENCOUNTER — Other Ambulatory Visit: Payer: Self-pay | Admitting: *Deleted

## 2019-03-18 ENCOUNTER — Other Ambulatory Visit: Payer: Self-pay

## 2019-03-18 ENCOUNTER — Encounter: Payer: Self-pay | Admitting: Gastroenterology

## 2019-03-18 VITALS — BP 122/77 | HR 120 | Temp 96.6°F | Ht 63.0 in | Wt 196.8 lb

## 2019-03-18 DIAGNOSIS — Z8719 Personal history of other diseases of the digestive system: Secondary | ICD-10-CM | POA: Insufficient documentation

## 2019-03-18 DIAGNOSIS — R102 Pelvic and perineal pain: Secondary | ICD-10-CM | POA: Diagnosis not present

## 2019-03-18 DIAGNOSIS — D3502 Benign neoplasm of left adrenal gland: Secondary | ICD-10-CM | POA: Diagnosis not present

## 2019-03-18 DIAGNOSIS — K760 Fatty (change of) liver, not elsewhere classified: Secondary | ICD-10-CM | POA: Diagnosis not present

## 2019-03-18 DIAGNOSIS — K5730 Diverticulosis of large intestine without perforation or abscess without bleeding: Secondary | ICD-10-CM | POA: Diagnosis not present

## 2019-03-18 DIAGNOSIS — N898 Other specified noninflammatory disorders of vagina: Secondary | ICD-10-CM

## 2019-03-18 MED ORDER — PEG 3350-KCL-NA BICARB-NACL 420 G PO SOLR: 4000.0000 mL | Freq: Once | ORAL | 0 refills | Status: AC

## 2019-03-18 MED ORDER — IOHEXOL 300 MG/ML  SOLN
100.0000 mL | Freq: Once | INTRAMUSCULAR | Status: AC | PRN
  Administered 2019-03-18: 100 mL via INTRAVENOUS

## 2019-03-18 MED ORDER — IOHEXOL 300 MG/ML  SOLN
30.0000 mL | Freq: Once | INTRAMUSCULAR | Status: AC | PRN
  Administered 2019-03-18: 30 mL via ORAL

## 2019-03-18 NOTE — Telephone Encounter (Signed)
PA for CT abd/pelv was approved via [employer]. Auth# [id number] Dates 03/18/2019-09/14/2019

## 2019-03-18 NOTE — Patient Instructions (Signed)
Please have CT completed at [facility]   We will get you scheduled for colonoscopy with Dr. Oneida Alar in near future pending your CT results.   We will also get you referred back to GYN.   Aliene Altes, PA-C [facility]astroenter[facility]

## 2019-03-19 ENCOUNTER — Telehealth: Payer: Self-pay | Admitting: Gastroenterology

## 2019-03-19 DIAGNOSIS — Z8719 Personal history of other diseases of the digestive system: Secondary | ICD-10-CM | POA: Insufficient documentation

## 2019-03-19 DIAGNOSIS — R102 Pelvic and perineal pain: Secondary | ICD-10-CM | POA: Insufficient documentation

## 2019-03-19 DIAGNOSIS — N898 Other specified noninflammatory disorders of vagina: Secondary | ICD-10-CM | POA: Insufficient documentation

## 2019-03-19 NOTE — Assessment & Plan Note (Addendum)
68 y.o. female with recent history of diverticulitis diagnosed via CT on 12/05/18. CT with Low-grade sigmoid diverticulitis. Difficult to completely rule out rectovaginal fistula; however, no current contrast medium or significant amount of gas in the vagina. some gas in the urinary bladder lumen. Patient had originally been seen by OBGYN for vaginal discharge and pain which propted the CT. Per OBGYN documentation, patient completed cipro 500 BID and flagyl 500 bid x 10 days and had improvement at follow-up on 12/24/18 with no vaginal discharge documented. Today, patient states she has had fowl smelling vaginal discharge and constant sharp vaginal pain that worsens with BMs for the last couple months. Doesn't remember being treated for diverticulitis. No abdominal pain. BMs daily. Some straining in the morning, but with loose stools after meals which is chronic. No night time stools. No blood in the stools. Dark stools when taking pepto bismol. No fever, chills, nausea, vomiting, or other upper or lower GI symptoms. No urinary symptoms. Last colonoscopy June 2014 with 4 polyps removed. Moderate diverticulosis throughout entire colon. Small internal hemorrhoids. Pathology with tubular adenomas and one hyperplastic polyp. Repeat due in 2019.  Patient needs colonoscopy s/p recent diverticulitis and is overdue for surveillance. Query whether she has rectovaginal fistula causing her symptoms as was questioned on CT in April. As she reports never having abdominal pain and only vaginal pain, will repeat CT abdomen and pelvis to rule out recurrence/ongoing diverticulitis.  Will schedule for TCS with Dr. Oneida Alar after CT results.  Refer back to OBGYN for vaginal discharge and vaginal pain.

## 2019-03-19 NOTE — Assessment & Plan Note (Signed)
Addressed under history of diverticulitis

## 2019-03-19 NOTE — Progress Notes (Signed)
No evidence of ongoing diverticulitis. Spoke with Dr. Golden Circle who says there is no clear evidence of rectovaginal fistula. Will proceed with TCS with Dr. Oneida Alar. Patient should follow up with OBGYN as planned.

## 2019-03-19 NOTE — Telephone Encounter (Signed)
LMOVM for pt  Called endo and made aware to cancel current orders. We will call patient to schedule TCS with propofol with SLF once we receive November schedule.

## 2019-03-19 NOTE — Telephone Encounter (Signed)
[facility] clinical pool, can we let patient know CT without evidence of diverticulitis. We will proceed with colonoscopy as planned. She should also keep follow-up with OBGYN as recommended.   Spoke with Dr. Oneida Alar. Patient needs to be with propofol.  I think I already gave diabetes medication adjustment recommendations. Let me know if I did not.

## 2019-03-20 ENCOUNTER — Telehealth: Payer: Self-pay | Admitting: Gastroenterology

## 2019-03-20 NOTE — Telephone Encounter (Signed)
PT had a CT scan on 03/18/2019.  Forwarding to [employer], PA to address.

## 2019-03-20 NOTE — Telephone Encounter (Signed)
Noted and pt is aware.

## 2019-03-20 NOTE — Telephone Encounter (Signed)
Pt is aware of results and will see OB/GYN. She is aware staff will call to schedule procedure when they get Dr. Oneida Alar schedule. She would like a Monday or Thursday if at all possible.

## 2019-03-20 NOTE — Telephone Encounter (Signed)
Pt was calling for her results of her MRI. I told her it would take 7-10 business days ,but I would let the nurse know that she had called. [phone]

## 2019-03-20 NOTE — Telephone Encounter (Signed)
Please see other phone note. Mindy had tried to call with results and get her scheduled for TCS. She left message for patient.   Mindy FYI

## 2019-04-01 ENCOUNTER — Other Ambulatory Visit: Payer: Self-pay | Admitting: Family Medicine

## 2019-04-08 ENCOUNTER — Telehealth: Payer: Self-pay | Admitting: Obstetrics & Gynecology

## 2019-04-08 ENCOUNTER — Encounter: Payer: Self-pay | Admitting: *Deleted

## 2019-04-08 ENCOUNTER — Telehealth: Payer: Self-pay | Admitting: Gastroenterology

## 2019-04-08 ENCOUNTER — Other Ambulatory Visit: Payer: Self-pay | Admitting: *Deleted

## 2019-04-08 DIAGNOSIS — Z8719 Personal history of other diseases of the digestive system: Secondary | ICD-10-CM

## 2019-04-08 DIAGNOSIS — R69 Illness, unspecified: Secondary | ICD-10-CM | POA: Diagnosis not present

## 2019-04-08 NOTE — Telephone Encounter (Signed)
LMOM to call to discuss.

## 2019-04-08 NOTE — Telephone Encounter (Signed)
Pt returned call and wants to have a referral to get a TCS. Pt was scheduled to have her TCS 04/18/2019 and it was r/s to 06/2019. Pt doesn't want to wait and states someone has to see what's going with her. Please advise on pt wanting a referral.

## 2019-04-08 NOTE — Telephone Encounter (Signed)
IF SHE CAN BE DONE SEP 22 THEN CALL PT AND LET HER KNOW.

## 2019-04-08 NOTE — Telephone Encounter (Signed)
See if WE CAN ADD PT ON SEP 22 W/ MAC.

## 2019-04-08 NOTE — Telephone Encounter (Signed)
Please call pt Stephanie Hammond wants to discuss her options Stephanie Hammond is having problems and Dr Elonda Husky he had wanted her to see a GI and they have told her that it may be gyn. I was going to make her appt to come and discuss with Eure but I didn't  Want to bring her in if he feels that Stephanie Hammond needs tohave that first

## 2019-04-08 NOTE — Telephone Encounter (Signed)
[phone] please call patient, she said she is having issues and can not wait until November for her procedure

## 2019-04-08 NOTE — Telephone Encounter (Signed)
Called patient to schedule TCS with propofol with [facility]. She states no one made her aware that her procedure was cancelled for next week. I advised her of message below where DS spoke with her and made her aware. She does not recall this. She is scheduled for 11/17 at 8:30am. Patient aware will mail instructions with pre-op/covid-19 appt to her in the mail. Confirmed address. She already has prep at home.

## 2019-04-09 ENCOUNTER — Telehealth: Payer: Self-pay | Admitting: Gastroenterology

## 2019-04-09 ENCOUNTER — Encounter: Payer: Self-pay | Admitting: Gastroenterology

## 2019-04-09 DIAGNOSIS — N898 Other specified noninflammatory disorders of vagina: Secondary | ICD-10-CM

## 2019-04-09 NOTE — Telephone Encounter (Signed)
I will be glad to see the patient

## 2019-04-09 NOTE — Telephone Encounter (Signed)
Stephanie Hammond is checking with scheduling.

## 2019-04-09 NOTE — Telephone Encounter (Signed)
REVIEWED-NO ADDITIONAL RECOMMENDATIONS. 

## 2019-04-09 NOTE — Telephone Encounter (Signed)
STAYS IN BATHROOM ALL THE TIME. SOFT STOOLS AND NOT DIARRHEA AND MAY BE FIRM. HAD CT AUG 11. I PERSONALLY REVIEWED THE CT AUG 2020 WITH DR. Thornton Papas: SUSPICIOUS FOR COLOVAGINAL FISTULA. HAVING TO USE PADS FOR VAGINAL DISCHARGE AND DARK LIKE STOOL.   PT NEEDS CT PELVIS W/ RECTAL CONTRAST WITHIN 7 DAYS TO ASSESS FOR COLOVAGINAL FISTULA, Dx: FECULENT VAGINAL DISCHARGE.

## 2019-04-09 NOTE — Telephone Encounter (Signed)
PA for CT submitted via [employer] website. Case pending. Service order: [id number]. Clinical notes faxed and uploaded to [place].

## 2019-04-09 NOTE — Telephone Encounter (Signed)
LVM for pt to call back to schedule.

## 2019-04-09 NOTE — Telephone Encounter (Signed)
SPOKE WITH DR. Elonda Husky. EXPLAINED CT FINDINGS. DISCUSSED PLAN.

## 2019-04-09 NOTE — Telephone Encounter (Signed)
Called pt, TCS moved up to 04/29/19 at 2:45pm. Informed endo scheduler. Pre-op appt 04/25/19 at 10:30am and COVID test appt 04/25/19 at 11:35am. New instructions and appt letter mailed.

## 2019-04-10 NOTE — Telephone Encounter (Signed)
PA pending per [employer].

## 2019-04-11 ENCOUNTER — Other Ambulatory Visit: Payer: Self-pay | Admitting: Family Medicine

## 2019-04-11 NOTE — Telephone Encounter (Signed)
PA pending when checked this morning.

## 2019-04-11 NOTE — Telephone Encounter (Signed)
Patient is calling to get refill on oxycodone [facility]  Patient says she is out

## 2019-04-15 ENCOUNTER — Other Ambulatory Visit (HOSPITAL_COMMUNITY): Payer: Medicare HMO

## 2019-04-15 ENCOUNTER — Other Ambulatory Visit: Payer: Self-pay

## 2019-04-15 DIAGNOSIS — N898 Other specified noninflammatory disorders of vagina: Secondary | ICD-10-CM

## 2019-04-15 MED ORDER — OXYCODONE-ACETAMINOPHEN 7.5-325 MG PO TABS: 1.0000 | ORAL_TABLET | Freq: Four times a day (QID) | ORAL | 0 refills | Status: DC | PRN

## 2019-04-15 NOTE — Telephone Encounter (Signed)
CT approved. PA# [id number], valid 04/13/19-10/10/19.  CT scheduled for 04/18/19 at 10:30am, arrive at 10:15am. NPO 4 hours prior to test. Pick up contrast prior to test. Will need updated BUN prior to CT.  Tried to call pt to inform of CT appt, call went straight to VM, LMOVM for return call.

## 2019-04-15 NOTE — Telephone Encounter (Signed)
Noted will fill today

## 2019-04-15 NOTE — Telephone Encounter (Signed)
Ok to refill??  Last office visit 10/09/2018.  Last refill 03/14/2019.  Of note, pharmacy filled prescription from Dr. Lyla Glassing for Oxycodone 5mg  on 04/11/2019, #40 tabs. Patient states that she did not notice that the prescription was from another MD until she returned home. Reports that she did throw prescription away.   Pharmacy states that there should not be an issue with filling prescription if MD approves.

## 2019-04-15 NOTE — Telephone Encounter (Signed)
Pt called office, informed of appt. She wanted to change appt, gave her phone number to central scheduling. Pt needs rectal contrast not oral and she is aware. Also aware to have blood work done.

## 2019-04-18 ENCOUNTER — Ambulatory Visit (HOSPITAL_COMMUNITY): Admit: 2019-04-18 | Payer: Medicare HMO | Admitting: Gastroenterology

## 2019-04-18 ENCOUNTER — Ambulatory Visit (HOSPITAL_COMMUNITY): Payer: Medicare HMO

## 2019-04-18 ENCOUNTER — Encounter (HOSPITAL_COMMUNITY): Payer: Self-pay

## 2019-04-18 SURGERY — COLONOSCOPY
Anesthesia: Moderate Sedation

## 2019-04-22 ENCOUNTER — Telehealth: Payer: Self-pay | Admitting: Gastroenterology

## 2019-04-22 NOTE — Telephone Encounter (Addendum)
Called pt, she is scheduled for CT with rectal contrast 04/28/19 and will start drinking TCS prep after the CT. She wanted to know if she could still have the CT that day. Called [facility] CT dept and spoke to Troy, pt ok to have CT as scheduled then start drinking TCS prep afterwards. Called and informed pt. She thinks she will not get home in time to start TCS prep. Advised her to start prep as soon as she returns home.

## 2019-04-22 NOTE — Telephone Encounter (Signed)
Pt is scheduled a procedure with SF on 9/22. She doesn't know if she should reschedule or not due to other things she's scheduled for and having time to do the prep. Please call her at [phone]

## 2019-04-24 ENCOUNTER — Telehealth: Payer: Self-pay

## 2019-04-24 NOTE — Telephone Encounter (Addendum)
T/C from [facility] in Radiology, calling to make sure she was supposed to put the pt back on for the CT since she had just had one done on 03/18/2019. I told her Dr. Oneida Alar was asking for it. She asked if precert was done and I told her the precert was done on [id number]. She will call back if any questions.

## 2019-04-24 NOTE — Telephone Encounter (Signed)
PT called and was informed Dr. Oneida Alar said she needs to have CT prior to TCS. She is calling to see if they can put her back on the schedule. Per Mindy, this is second time pt has cancelled.

## 2019-04-24 NOTE — Telephone Encounter (Signed)
Called and mailbox is full.

## 2019-04-24 NOTE — Telephone Encounter (Signed)
PT called back. She wants to cancel the CT for Monday. Said she has too much going on now, and doesn't want to drink contrast for the CT and then do the prep for colonoscopy. Per Mindy, I gave pt the number to call and cancel the CT scan. She said she will proceed with colonoscopy and then discuss about whether to do the CT scan. Forwarding FYI to [employer], [place].

## 2019-04-24 NOTE — Telephone Encounter (Signed)
Pt left vm that she has questions about procedure she will have next week. I returned her call and left message for a return call to discuss her concerns.

## 2019-04-24 NOTE — Telephone Encounter (Signed)
Forwarding to Dr. Oneida Alar who placed order for CT. Not sure if patient needs CT prior to colonoscopy. Dr. Oneida Alar, please advise.

## 2019-04-24 NOTE — Patient Instructions (Signed)
Stephanie Hammond  04/24/2019     @PREFPERIOPPHARMACY @   Your procedure is scheduled on  04/29/2019 .  Report to [facility] at  1315  P.M. (1:15 PM)  Call this number if you have problems the morning of surgery:  [phone]   Remember:  Follow the diet and prep instructions given to you by Dr Nona Dell office.                       Take these medicines the morning of surgery with A SIP OF WATER  Allopurinol, amlodipine, prilosec, Oxycodone(if needed). Take 1/2 of your usual night time insulin dose the night before your procedure.    Do not wear jewelry, make-up or nail polish.  Do not wear lotions, powders, or perfumes.Please wear deodorant and brush your teeth.  Do not shave 48 hours prior to surgery.  Men may shave face and neck.  Do not bring valuables to the hospital.  [facility] is not responsible for any belongings or valuables.  Contacts, dentures or bridgework may not be worn into surgery.  Leave your suitcase in the car.  After surgery it may be brought to your room.  For patients admitted to the hospital, discharge time will be determined by your treatment team.  Patients discharged the day of surgery will not be allowed to drive home.   Name and phone number of your driver:   family Special instructions:  DO NOT take any medications for diabetes the morning of procedure.  Please read over the following fact sheets that you were given. Anesthesia Post-op Instructions and Care and Recovery After Surgery       Colonoscopy, Adult, Care After This sheet gives you information about how to care for yourself after your procedure. Your health care provider may also give you more specific instructions. If you have problems or questions, contact your health care provider. What can I expect after the procedure? After the procedure, it is common to have:  A small amount of blood in your stool for 24 hours after the procedure.  Some gas.  Mild abdominal cramping  or bloating. Follow these instructions at home: General instructions  For the first 24 hours after the procedure: ? Do not drive or use machinery. ? Do not sign important documents. ? Do not drink alcohol. ? Do your regular daily activities at a slower pace than normal. ? Eat soft, easy-to-digest foods.  Take over-the-counter or prescription medicines only as told by your health care provider. Relieving cramping and bloating   Try walking around when you have cramps or feel bloated.  Apply heat to your abdomen as told by your health care provider. Use a heat source that your health care provider recommends, such as a moist heat pack or a heating pad. ? Place a towel between your skin and the heat source. ? Leave the heat on for 20-30 minutes. ? Remove the heat if your skin turns bright red. This is especially important if you are unable to feel pain, heat, or cold. You may have a greater risk of getting burned. Eating and drinking   Drink enough fluid to keep your urine pale yellow.  Resume your normal diet as instructed by your health care provider. Avoid heavy or fried foods that are hard to digest.  Avoid drinking alcohol for as long as instructed by your health care provider. Contact a health care provider if:  You have blood  in your stool 2-3 days after the procedure. Get help right away if:  You have more than a small spotting of blood in your stool.  You pass large blood clots in your stool.  Your abdomen is swollen.  You have nausea or vomiting.  You have a fever.  You have increasing abdominal pain that is not relieved with medicine. Summary  After the procedure, it is common to have a small amount of blood in your stool. You may also have mild abdominal cramping and bloating.  For the first 24 hours after the procedure, do not drive or use machinery, sign important documents, or drink alcohol.  Contact your health care provider if you have a lot of blood  in your stool, nausea or vomiting, a fever, or increased abdominal pain. This information is not intended to replace advice given to you by your health care provider. Make sure you discuss any questions you have with your health care provider. Document Released: 03/07/2004 Document Revised: 05/16/2017 Document Reviewed: 10/05/2015 Elsevier Patient Education  2020 [place] After These instructions provide you with information about caring for yourself after your procedure. Your health care provider may also give you more specific instructions. Your treatment has been planned according to current medical practices, but problems sometimes occur. Call your health care provider if you have any problems or questions after your procedure. What can I expect after the procedure? After your procedure, you may:  Feel sleepy for several hours.  Feel clumsy and have poor balance for several hours.  Feel forgetful about what happened after the procedure.  Have poor judgment for several hours.  Feel nauseous or vomit.  Have a sore throat if you had a breathing tube during the procedure. Follow these instructions at home: For at least 24 hours after the procedure:      Have a responsible adult stay with you. It is important to have someone help care for you until you are awake and alert.  Rest as needed.  Do not: ? Participate in activities in which you could fall or become injured. ? Drive. ? Use heavy machinery. ? Drink alcohol. ? Take sleeping pills or medicines that cause drowsiness. ? Make important decisions or sign legal documents. ? Take care of children on your own. Eating and drinking  Follow the diet that is recommended by your health care provider.  If you vomit, drink water, juice, or soup when you can drink without vomiting.  Make sure you have little or no nausea before eating solid foods. General instructions  Take over-the-counter  and prescription medicines only as told by your health care provider.  If you have sleep apnea, surgery and certain medicines can increase your risk for breathing problems. Follow instructions from your health care provider about wearing your sleep device: ? Anytime you are sleeping, including during daytime naps. ? While taking prescription pain medicines, sleeping medicines, or medicines that make you drowsy.  If you smoke, do not smoke without supervision.  Keep all follow-up visits as told by your health care provider. This is important. Contact a health care provider if:  You keep feeling nauseous or you keep vomiting.  You feel light-headed.  You develop a rash.  You have a fever. Get help right away if:  You have trouble breathing. Summary  For several hours after your procedure, you may feel sleepy and have poor judgment.  Have a responsible adult stay with you for at least 24  hours or until you are awake and alert. This information is not intended to replace advice given to you by your health care provider. Make sure you discuss any questions you have with your health care provider. Document Released: 11/14/2015 Document Revised: 10/22/2017 Document Reviewed: 11/14/2015 Elsevier Patient Education  2020 [employer].

## 2019-04-24 NOTE — Telephone Encounter (Signed)
I called pt's husband, Purcell Nails, and told him to have pt call me at once. I have a very important message from Dr. Oneida Alar for her.  He said he will do so.

## 2019-04-24 NOTE — Telephone Encounter (Signed)
PLEASE CALL PT. It is important that she have the ct prior to her colonoscopy. WE NEED TO KNOW WHAT'S HAPPENING IN HER PELVIS PRIOR TO THE COLONOSCOPY.. I SPOKE WITH RADIOLOGY AND THEY RECOMMENDED CONTRAST ORAL & RECTAL.

## 2019-04-25 ENCOUNTER — Encounter (HOSPITAL_COMMUNITY)
Admission: RE | Admit: 2019-04-25 | Discharge: 2019-04-25 | Disposition: A | Discharge status: Home / Self Care | Payer: Medicare HMO | Source: Ambulatory Visit | Attending: Gastroenterology | Admitting: Gastroenterology

## 2019-04-25 ENCOUNTER — Other Ambulatory Visit (HOSPITAL_COMMUNITY)
Admission: RE | Admit: 2019-04-25 | Discharge: 2019-04-25 | Disposition: A | Discharge status: Home / Self Care | Payer: Medicare HMO | Source: Ambulatory Visit | Attending: Gastroenterology | Admitting: Gastroenterology

## 2019-04-25 ENCOUNTER — Other Ambulatory Visit: Payer: Self-pay

## 2019-04-25 DIAGNOSIS — K5790 Diverticulosis of intestine, part unspecified, without perforation or abscess without bleeding: Secondary | ICD-10-CM | POA: Insufficient documentation

## 2019-04-25 DIAGNOSIS — Z01812 Encounter for preprocedural laboratory examination: Secondary | ICD-10-CM | POA: Diagnosis not present

## 2019-04-25 DIAGNOSIS — K649 Unspecified hemorrhoids: Secondary | ICD-10-CM | POA: Insufficient documentation

## 2019-04-25 DIAGNOSIS — K635 Polyp of colon: Secondary | ICD-10-CM | POA: Insufficient documentation

## 2019-04-25 LAB — BASIC METABOLIC PANEL
Anion gap: 12 (ref 5–15)
BUN: 20 mg/dL (ref 8–23)
CO2: 14 mmol/L — ABNORMAL LOW (ref 22–32)
Calcium: 8.7 mg/dL — ABNORMAL LOW (ref 8.9–10.3)
Chloride: 110 mmol/L (ref 98–111)
Creatinine, Ser: 0.88 mg/dL (ref 0.44–1.00)
GFR calc Af Amer: >60 mL/min (ref >60)
GFR calc non Af Amer: >60 mL/min (ref >60)
Glucose, Bld: 265 mg/dL — ABNORMAL HIGH (ref 70–99)
Potassium: 4.2 mmol/L (ref 3.5–5.1)
Sodium: 136 mmol/L (ref 135–145)

## 2019-04-25 LAB — SARS CORONAVIRUS 2 (TAT 6-24 HRS): SARS Coronavirus 2: NEGATIVE

## 2019-04-25 NOTE — Telephone Encounter (Signed)
REVIEWED-PLEASE CALL RADIOLOGY AND MAKE SURE THEY AE AWARE THE PT NEEDS ORAL AND RECTAL CONTRAST.

## 2019-04-25 NOTE — Telephone Encounter (Signed)
I called CT dept and spoke to [place] and she will leave a note for the ones that will be there on Monday.

## 2019-04-28 ENCOUNTER — Telehealth: Payer: Self-pay | Admitting: *Deleted

## 2019-04-28 ENCOUNTER — Ambulatory Visit (HOSPITAL_COMMUNITY): Payer: Medicare HMO

## 2019-04-28 ENCOUNTER — Other Ambulatory Visit: Payer: Self-pay

## 2019-04-28 ENCOUNTER — Ambulatory Visit (HOSPITAL_COMMUNITY)
Admission: RE | Admit: 2019-04-28 | Discharge: 2019-04-28 | Disposition: A | Discharge status: Home / Self Care | Payer: Medicare HMO | Source: Ambulatory Visit | Attending: Gastroenterology | Admitting: Gastroenterology

## 2019-04-28 DIAGNOSIS — N898 Other specified noninflammatory disorders of vagina: Secondary | ICD-10-CM | POA: Diagnosis present

## 2019-04-28 DIAGNOSIS — N823 Fistula of vagina to large intestine: Secondary | ICD-10-CM | POA: Insufficient documentation

## 2019-04-28 MED ORDER — IOHEXOL 300 MG/ML  SOLN
100.0000 mL | Freq: Once | INTRAMUSCULAR | Status: AC | PRN
  Administered 2019-04-28: 100 mL via INTRAVENOUS

## 2019-04-28 NOTE — Telephone Encounter (Signed)
Endo called and wanted to see if patient would move procedure time up tomorrow with [facility].   Called pt, went straight to VM, VM full

## 2019-04-28 NOTE — Telephone Encounter (Signed)
Tried to call pt, VM full unable to leave message.

## 2019-04-28 NOTE — Telephone Encounter (Signed)
Called patient, went straight to VM.

## 2019-04-29 ENCOUNTER — Encounter (HOSPITAL_COMMUNITY)
Admission: RE | Disposition: A | Discharge status: Home / Self Care | Payer: Self-pay | Source: Home / Self Care | Attending: Gastroenterology

## 2019-04-29 ENCOUNTER — Ambulatory Visit (HOSPITAL_COMMUNITY): Payer: Medicare HMO | Admitting: Anesthesiology

## 2019-04-29 ENCOUNTER — Encounter (HOSPITAL_COMMUNITY): Payer: Self-pay | Admitting: Gastroenterology

## 2019-04-29 ENCOUNTER — Telehealth: Payer: Self-pay | Admitting: Gastroenterology

## 2019-04-29 ENCOUNTER — Other Ambulatory Visit: Payer: Self-pay | Admitting: *Deleted

## 2019-04-29 ENCOUNTER — Ambulatory Visit (HOSPITAL_COMMUNITY)
Admission: RE | Admit: 2019-04-29 | Discharge: 2019-04-29 | Disposition: A | Discharge status: Home / Self Care | Payer: Medicare HMO | Attending: Gastroenterology | Admitting: Gastroenterology

## 2019-04-29 DIAGNOSIS — E119 Type 2 diabetes mellitus without complications: Secondary | ICD-10-CM | POA: Insufficient documentation

## 2019-04-29 DIAGNOSIS — I2510 Atherosclerotic heart disease of native coronary artery without angina pectoris: Secondary | ICD-10-CM | POA: Insufficient documentation

## 2019-04-29 DIAGNOSIS — M109 Gout, unspecified: Secondary | ICD-10-CM | POA: Insufficient documentation

## 2019-04-29 DIAGNOSIS — Z79899 Other long term (current) drug therapy: Secondary | ICD-10-CM | POA: Insufficient documentation

## 2019-04-29 DIAGNOSIS — Z794 Long term (current) use of insulin: Secondary | ICD-10-CM | POA: Insufficient documentation

## 2019-04-29 DIAGNOSIS — K5730 Diverticulosis of large intestine without perforation or abscess without bleeding: Secondary | ICD-10-CM | POA: Insufficient documentation

## 2019-04-29 DIAGNOSIS — K644 Residual hemorrhoidal skin tags: Secondary | ICD-10-CM | POA: Insufficient documentation

## 2019-04-29 DIAGNOSIS — D122 Benign neoplasm of ascending colon: Secondary | ICD-10-CM | POA: Diagnosis not present

## 2019-04-29 DIAGNOSIS — M1990 Unspecified osteoarthritis, unspecified site: Secondary | ICD-10-CM | POA: Diagnosis not present

## 2019-04-29 DIAGNOSIS — K635 Polyp of colon: Secondary | ICD-10-CM

## 2019-04-29 DIAGNOSIS — Q438 Other specified congenital malformations of intestine: Secondary | ICD-10-CM | POA: Diagnosis not present

## 2019-04-29 DIAGNOSIS — Z8719 Personal history of other diseases of the digestive system: Secondary | ICD-10-CM

## 2019-04-29 DIAGNOSIS — N823 Fistula of vagina to large intestine: Secondary | ICD-10-CM

## 2019-04-29 DIAGNOSIS — Z96653 Presence of artificial knee joint, bilateral: Secondary | ICD-10-CM | POA: Insufficient documentation

## 2019-04-29 DIAGNOSIS — F17210 Nicotine dependence, cigarettes, uncomplicated: Secondary | ICD-10-CM | POA: Diagnosis not present

## 2019-04-29 DIAGNOSIS — K648 Other hemorrhoids: Secondary | ICD-10-CM | POA: Diagnosis not present

## 2019-04-29 DIAGNOSIS — I10 Essential (primary) hypertension: Secondary | ICD-10-CM | POA: Insufficient documentation

## 2019-04-29 DIAGNOSIS — K219 Gastro-esophageal reflux disease without esophagitis: Secondary | ICD-10-CM | POA: Diagnosis not present

## 2019-04-29 DIAGNOSIS — K589 Irritable bowel syndrome without diarrhea: Secondary | ICD-10-CM | POA: Insufficient documentation

## 2019-04-29 HISTORY — PX: COLONOSCOPY WITH PROPOFOL: SHX5780

## 2019-04-29 HISTORY — PX: POLYPECTOMY: SHX5525

## 2019-04-29 LAB — GLUCOSE, CAPILLARY
Glucose-Capillary: 97 mg/dL (ref 70–99)
Glucose-Capillary: 94 mg/dL (ref 70–99)

## 2019-04-29 SURGERY — COLONOSCOPY WITH PROPOFOL
Anesthesia: General

## 2019-04-29 MED ORDER — CHLORHEXIDINE GLUCONATE CLOTH 2 % EX PADS: 6.0000 | MEDICATED_PAD | Freq: Once | CUTANEOUS | Status: DC

## 2019-04-29 MED ORDER — LACTATED RINGERS IV SOLN
INTRAVENOUS | Status: DC
  Administered 2019-04-29: 12:00:00 via INTRAVENOUS

## 2019-04-29 MED ORDER — HYDROCODONE-ACETAMINOPHEN 7.5-325 MG PO TABS: 1.0000 | ORAL_TABLET | Freq: Once | ORAL | Status: DC | PRN

## 2019-04-29 MED ORDER — KETAMINE HCL 50 MG/5ML IJ SOSY
PREFILLED_SYRINGE | INTRAMUSCULAR | Status: AC
  Filled 2019-04-29: qty 5

## 2019-04-29 MED ORDER — PROMETHAZINE HCL 25 MG/ML IJ SOLN: 6.2500 mg | INTRAMUSCULAR | Status: DC | PRN

## 2019-04-29 MED ORDER — PROPOFOL 500 MG/50ML IV EMUL
INTRAVENOUS | Status: DC | PRN
  Administered 2019-04-29: 150 ug/kg/min via INTRAVENOUS
  Administered 2019-04-29 (×2): via INTRAVENOUS

## 2019-04-29 MED ORDER — PROPOFOL 10 MG/ML IV BOLUS
INTRAVENOUS | Status: DC | PRN
  Administered 2019-04-29: 20 mg via INTRAVENOUS

## 2019-04-29 MED ORDER — LIDOCAINE HCL (CARDIAC) PF 100 MG/5ML IV SOSY
PREFILLED_SYRINGE | INTRAVENOUS | Status: DC | PRN
  Administered 2019-04-29: 40 mg via INTRAVENOUS

## 2019-04-29 MED ORDER — HYDROMORPHONE HCL 1 MG/ML IJ SOLN: 0.2500 mg | INTRAMUSCULAR | Status: DC | PRN

## 2019-04-29 MED ORDER — KETAMINE HCL 10 MG/ML IJ SOLN
INTRAMUSCULAR | Status: DC | PRN
  Administered 2019-04-29: 10 mg via INTRAVENOUS

## 2019-04-29 MED ORDER — GLYCOPYRROLATE 0.2 MG/ML IJ SOLN
INTRAMUSCULAR | Status: DC | PRN
  Administered 2019-04-29: 0.2 mg via INTRAVENOUS

## 2019-04-29 MED ORDER — MIDAZOLAM HCL 2 MG/2ML IJ SOLN: 0.5000 mg | Freq: Once | INTRAMUSCULAR | Status: DC | PRN

## 2019-04-29 MED ORDER — ONDANSETRON HCL 4 MG/2ML IJ SOLN
INTRAMUSCULAR | Status: DC | PRN
  Administered 2019-04-29: 4 mg via INTRAVENOUS

## 2019-04-29 NOTE — Telephone Encounter (Signed)
Danie Binder, MD  Inge Rise, CMA        START WITH GENERAL SURGERY ASAP      Referral faxed to gen surgery [facility]

## 2019-04-29 NOTE — Anesthesia Postprocedure Evaluation (Signed)
Anesthesia Post Note  Patient: Stephanie Hammond  Procedure(s) Performed: COLONOSCOPY WITH PROPOFOL (N/A ) POLYPECTOMY  Patient location during evaluation: PACU Anesthesia Type: General Level of consciousness: awake and alert and oriented Pain management: pain level controlled Vital Signs Assessment: post-procedure vital signs reviewed and stable Respiratory status: spontaneous breathing Cardiovascular status: stable : Some nausea relieved with zofran. Anesthetic complications: no     Last Vitals:  Vitals:   04/29/19 1128 04/29/19 1240  BP: (!) 168/91   Pulse: (!) 113   Resp: 18   Temp: 36.9 C 36.6 C  SpO2: 95%     Last Pain:  Vitals:   04/29/19 1128  TempSrc: Oral  PainSc: 0-No pain                 Anfernee Peschke A

## 2019-04-29 NOTE — H&P (Signed)
Primary Care Physician:  Alycia Rossetti, MD Primary Gastroenterologist:  Dr. Oneida Alar  Pre-Procedure History & Physical: HPI:  Stephanie Hammond is a 68 y.o. female here for Abnormal CT scan: RECTOVAGINAL FISTULA.  Past Medical History:  Diagnosis Date  . Anemia   . Blood transfusion 2009   after GI bleed  . CAD (coronary artery disease)   . Diabetes mellitus    2  . Diverticulitis   . Diverticulosis   . Fear of local anesthetic    01-02-2019, patient states with last surgery in 2016 , she refused to have epidural before she was sedated d/t to extreme aversion to needles near her back    . GERD (gastroesophageal reflux disease)   . Gout   . Headache    occ if sugar is  too hi or low   . Heart murmur    "i was told this years and years and years ago "  . History of GI diverticular bleed 08/2004  . Hypertension   . IBS (irritable bowel syndrome)   . Osteoarthritis   . Tachycardia    "it stays like that for years between 119 and 121 and it doesnt bother me  none "     Past Surgical History:  Procedure Laterality Date  . CHOLECYSTECTOMY  2003  . COLONOSCOPY  2000   Dr. Fuller Plan: marked diverticulosis, difficult procedure due to adhesions, polyps benign  . COLONOSCOPY  2006   Dr. Henrene Pastor: severe pandiverticulosis  . COLONOSCOPY N/A 02/03/2013   [facility]: 4 COLORECTAL POLYPS REMOVED/Moderate diverticulosis throughout the entire examined colon/Small internal hemorrhoids  . JOINT REPLACEMENT Right 2009   Knee Replacement Right   . PTCA     over 10 years ago , unsure of exact year , does recall that no stents were placed   . REPLACEMENT TOTAL KNEE     right   . TOTAL KNEE ARTHROPLASTY Left 03/12/2015   Procedure: LEFT TOTAL KNEE REPLACEMENT;  Surgeon: Carole Civil, MD;  Location: [facility];  Service: Orthopedics;  Laterality: Left;  . TOTAL KNEE REVISION Left 01/09/2019   Procedure: TOTAL KNEE REVISION;  Surgeon: Rod Can, MD;  Location: [facility];  Service: Orthopedics;   Laterality: Left;  [name] VAGINAL HYSTERECTOMY      Prior to Admission medications   Medication Sig Start Date End Date Taking? Authorizing Provider  allopurinol (ZYLOPRIM) 300 MG tablet TAKE 1 TABLET BY MOUTH EVERY DAY 04/01/19  Yes [place], Modena Nunnery, MD  amLODipine (NORVASC) 10 MG tablet TAKE 1 TABLET BY MOUTH EVERY DAY 01/06/19  Yes [place], Modena Nunnery, MD  atorvastatin (LIPITOR) 20 MG tablet TAKE 1 TABLET BY MOUTH EVERY DAY 01/06/19  Yes [place], Modena Nunnery, MD  bismuth subsalicylate (PEPTO BISMOL) 262 MG/15ML suspension Take 5 mLs by mouth daily as needed for indigestion or diarrhea or loose stools.    Yes [provider]  insulin glargine (LANTUS) 100 UNIT/ML injection Inject 12 Units into the skin at bedtime. Uses interchangeably with Tyler Aas if she cant get the Lantus   Yes [provider]  losartan (COZAAR) 50 MG tablet TAKE 2 TABLETS (100MG  TOTAL DOSE) BY MOUTH DAILY Patient taking differently: Take 100 mg by mouth daily.  02/28/19  Yes [place], Modena Nunnery, MD  metFORMIN (GLUCOPHAGE-XR) 500 MG 24 hr tablet TAKE 2 TABLETS(1000 MG) BY MOUTH DAILY WITH BREAKFAST Patient taking differently: Take 1,000 mg by mouth daily with breakfast. TAKE 2 TABLETS(1000 MG) BY MOUTH DAILY WITH BREAKFAST 11/04/18  Yes [place], [place]  F, MD  omeprazole (PRILOSEC) 20 MG capsule TAKE 1 CAPSULE BY MOUTH EVERY DAY IN THE MORNING Patient taking differently: Take 20 mg by mouth every morning.  03/14/19  Yes [place], Modena Nunnery, MD  oxyCODONE-acetaminophen (PERCOCET) 7.5-325 MG tablet Take 1 tablet by mouth every 6 (six) hours as needed for severe pain. 04/15/19  Yes [place], Modena Nunnery, MD  B-D UF III MINI PEN NEEDLES 31G X 5 MM MISC INJECT 1 EACH INTO THE SKIN DAILY. USE AS DIRECTED TO INJECT INSULIN DAILY. 12/23/18   [place], Modena Nunnery, MD  glucose blood (ONE TOUCH ULTRA TEST) test strip USE AS DIRECTED TO TEST THREE TIMES DAILY 07/12/18   Alycia Rossetti, MD  Lancets MISC Use as directed. 09/20/17   Alycia Rossetti, MD     Allergies as of 04/08/2019 - Review Complete 03/18/2019  Allergen Reaction Noted  . Ace inhibitors Cough 08/15/2012  . Bentyl [dicyclomine hcl] Other (See Comments) 02/03/2013  . Penicillins Itching and Swelling 03/28/2011    Family History  Problem Relation Age of Onset  . Colon polyps Sister   . Cancer Mother        lung   . Cancer Father        stomach   . Stomach cancer Father        questionable  . Colon cancer Neg Hx     Social History   Socioeconomic History  . Marital status: Married    Spouse name: Not on file  . Number of children: 2  . Years of education: Not on file  . Highest education level: Not on file  Occupational History  . Not on file  Social Needs  . Financial resource strain: Not on file  . Food insecurity    Worry: Not on file    Inability: Not on file  . Transportation needs    Medical: Not on file    Non-medical: Not on file  Tobacco Use  . Smoking status: Current Every Day Smoker    Packs/day: 1.00    Years: 35.00    Pack years: 35.00    Types: Cigarettes  . Smokeless tobacco: Never Used  Substance and Sexual Activity  . Alcohol use: No  . Drug use: No  . Sexual activity: Not Currently    Birth control/protection: Surgical    Comment: hyst  Lifestyle  . Physical activity    Days per week: Not on file    Minutes per session: Not on file  . Stress: Not on file  Relationships  . Social Herbalist on phone: Not on file    Gets together: Not on file    Attends religious service: Not on file    Active member of club or organization: Not on file    Attends meetings of clubs or organizations: Not on file    Relationship status: Not on file  . Intimate partner violence    Fear of current or ex partner: Not on file    Emotionally abused: Not on file    Physically abused: Not on file    Forced sexual activity: Not on file  Other Topics Concern  . Not on file  Social History Narrative  . Not on file    Review of  Systems: See HPI, otherwise negative ROS   Physical Exam: There were no vitals taken for this visit. General:   Alert,  pleasant and cooperative in NAD Head:  Normocephalic and atraumatic. Neck:  Supple; Lungs:  Clear throughout to auscultation.    Heart:  Regular rate and rhythm. Abdomen:  Soft, nontender and nondistended. Normal bowel sounds, without guarding, and without rebound.   Neurologic:  Alert and  oriented x4;  grossly normal neurologically.  Impression/Plan:    Abnormal CT scan: RECTOVAGINAL FISTULA  PLAN: 1. FLEX SIG/?COLONOSCOPY TODAY IF MASS SEEN IN COLON TODAY. DISCUSSED PROCEDURE, BENEFITS, & RISKS: < 1% chance of medication reaction, bleeding, perforation, ASPIRATION, or rupture of spleen/liver requiring surgery to fix it and missed polyps < 1 cm 10-20% of the time.

## 2019-04-29 NOTE — Anesthesia Preprocedure Evaluation (Signed)
Anesthesia Evaluation  Patient identified by MRN, date of birth, ID band Patient awake    Reviewed: Allergy & Precautions, NPO status , Patient's Chart, lab work & pertinent test results  Airway Mallampati: II  TM Distance: >3 FB Neck ROM: Full    Dental no notable dental hx. (+) Teeth Intact   Pulmonary neg pulmonary ROS, Current SmokerPatient did not abstain from smoking.,    Pulmonary exam normal breath sounds clear to auscultation       Cardiovascular Exercise Tolerance: Poor hypertension, + CAD  Normal cardiovascular examI Rhythm:Regular Rate:Normal  S/p 3 TKAs - still with pain in Left knee Denies CP/DOE   Neuro/Psych  Headaches, PSYCHIATRIC DISORDERS Depression  Neuromuscular disease    GI/Hepatic Neg liver ROS, GERD  Medicated and Controlled,Known RVF found on CT -here for Flex sig with Dr. Oneida Alar   Endo/Other  negative endocrine ROSdiabetes  Renal/GU negative Renal ROS  negative genitourinary   Musculoskeletal  (+) Arthritis , Osteoarthritis,    Abdominal   Peds negative pediatric ROS (+)  Hematology negative hematology ROS (+) anemia ,   Anesthesia Other Findings   Reproductive/Obstetrics negative OB ROS                             Anesthesia Physical Anesthesia Plan  ASA: III  Anesthesia Plan: General   Post-op Pain Management:    Induction: Intravenous  PONV Risk Score and Plan: 2 and Propofol infusion, TIVA, Ondansetron and Treatment may vary due to age or medical condition  Airway Management Planned: Nasal Cannula and Simple Face Mask  Additional Equipment:   Intra-op Plan:   Post-operative Plan:   Informed Consent: I have reviewed the patients History and Physical, chart, labs and discussed the procedure including the risks, benefits and alternatives for the proposed anesthesia with the patient or authorized representative who has indicated his/her  understanding and acceptance.     Dental advisory given  Plan Discussed with: CRNA  Anesthesia Plan Comments: (Plan Full PPE use  Plan GA with GETA as needed d/w pt -WTP with same after Q&A)        Anesthesia Quick Evaluation

## 2019-04-29 NOTE — Telephone Encounter (Signed)
DISCUSSED WITH DR. Elonda Husky. RECOMMENDED GENERAL SURGERY/GYN REFERRAL TO [facility], Dx: RECTOVAGINAL FISTULA IN SETTING OF DIVERTICULOSIS. DISCUSSED FINDINGS AND PLAN WITH PT. WILL COMPLETE FLEX SIG TODAY.

## 2019-04-29 NOTE — Addendum Note (Signed)
Addended by: Cheron Every on: 04/29/2019 10:02 AM   Modules accepted: Orders

## 2019-04-29 NOTE — Anesthesia Procedure Notes (Signed)
Procedure Name: General with mask airway Performed by: Jatavius Ellenwood A, CRNA Pre-anesthesia Checklist: Timeout performed, Patient being monitored, Suction available, Emergency Drugs available and Patient identified Patient Re-evaluated:Patient Re-evaluated prior to induction Oxygen Delivery Method: Non-rebreather mask       

## 2019-04-29 NOTE — Telephone Encounter (Signed)
Called CT and spoke with Mariea Clonts. Was advised since it is rectal contrast ONLY then order is for CT pelvis w/o contrast and put in comments rectal contrast only. Order placed. PA submitted via [employer] website for CT pelvis wo contrast. Clinicals faxed. Pending order # [id number]. Will call to schedule once we receive approval.

## 2019-04-29 NOTE — Telephone Encounter (Addendum)
I PERSONALLY REVIEWED THE CT WITH DR. Thornton Papas. Contras injected in vagina not rectum. PRESENCE OF FISTULA LIKELY. NEED PELVIC CT WITH RECTAL CONTRAST ONLY,Dx: RECTOVAGINAL FISTULA WITHIN 2 WEEKS. [facility] PLANNING TO CANCEL CHARGES FOR PRIOR STUDY. PT WILL STILL BE RESPONSIBLE FOR HOSPITAL CHARGES.  PT IS AWARE OF LIMITED STUDY DUE TO INJECTION LOCATION OF THE CONTRAST.

## 2019-04-29 NOTE — Transfer of Care (Signed)
Immediate Anesthesia Transfer of Care Note  Patient: Stephanie Hammond  Procedure(s) Performed: COLONOSCOPY WITH PROPOFOL (N/A ) POLYPECTOMY  Patient Location: PACU  Anesthesia Type:General  Level of Consciousness: awake, alert , oriented and patient cooperative  Airway & Oxygen Therapy: Patient Spontanous Breathing  Post-op Assessment: Report given to RN and Post -op Vital signs reviewed and stable  Post vital signs: Reviewed and stable  Last Vitals:  Vitals Value Taken Time  BP 131/57 04/29/19 1240  Temp    Pulse 100 04/29/19 1243  Resp 32 04/29/19 1243  SpO2 89 % 04/29/19 1243  Vitals shown include unvalidated device data.  Last Pain:  Vitals:   04/29/19 1128  TempSrc: Oral  PainSc: 0-No pain      Patients Stated Pain Goal: 5 ([id number])  Complications: No apparent anesthesia complications

## 2019-04-29 NOTE — Discharge Instructions (Signed)
You have internal and EXTERNAL hemorrhoids and diverticulosis IN YOUR LEFT AND RIGHT COLON.IT IS DIFFICULT TO PASS A SCOPE THROUGHT YOUR SIGMOID COLON DUE TO PARTS OF THE COLON BEING STUCK TO YOUR BLADDER, & VAGINA. YOU HAD ONE POLYP REMOVED.    DRINK WATER TO KEEP YOUR URINE LIGHT YELLOW.  CONTINUE YOUR WEIGHT LOSS EFFORTS. YOUR BODY MASS INDEX IS OVER 30 WHICH MEANS YOU ARE OBESE. OBESITY IS ASSOCIATED WITH AN INCREASE FOR ALL CANCERS, INCLUDING ESOPHAGEAL AND COLON CANCER.  FOLLOW A HIGH FIBER DIET. AVOID ITEMS THAT CAUSE BLOATING. See info below.   USE PREPARATION H FOUR TIMES  A DAY IF NEEDED TO RELIEVE RECTAL PAIN/PRESSURE/BLEEDING.   SEE GENERAL SURGERY AT [facility] TO HAVE THE CONNECTION BETWEEN YOUR COLON VAGINA & BLADDER REMOVED.  FOLLOW UP IN 4 MOS.   Next colonoscopy in 5-10 years.  Colonoscopy Care After Read the instructions outlined below and refer to this sheet in the next week. These discharge instructions provide you with general information on caring for yourself after you leave the hospital. While your treatment has been planned according to the most current medical practices available, unavoidable complications occasionally occur. If you have any problems or questions after discharge, call DR. Elizabet Schweppe, [phone].  ACTIVITY  You may resume your regular activity, but move at a slower pace for the next 24 hours.   Take frequent rest periods for the next 24 hours.   Walking will help get rid of the air and reduce the bloated feeling in your belly (abdomen).   No driving for 24 hours (because of the medicine (anesthesia) used during the test).   You may shower.   Do not sign any important legal documents or operate any machinery for 24 hours (because of the anesthesia used during the test).    NUTRITION  Drink plenty of fluids.   You may resume your normal diet as instructed by your doctor.   Begin with a light meal and progress to your normal diet. Heavy  or fried foods are harder to digest and may make you feel sick to your stomach (nauseated).   Avoid alcoholic beverages for 24 hours or as instructed.    MEDICATIONS  You may resume your normal medications.   WHAT YOU CAN EXPECT TODAY  Some feelings of bloating in the abdomen.   Passage of more gas than usual.   Spotting of blood in your stool or on the toilet paper  .  IF YOU HAD POLYPS REMOVED DURING THE COLONOSCOPY:  Eat a soft diet IF YOU HAVE NAUSEA, BLOATING, ABDOMINAL PAIN, OR VOMITING.    FINDING OUT THE RESULTS OF YOUR TEST Not all test results are available during your visit. DR. Oneida Alar WILL CALL YOU WITHIN 14 DAYS OF YOUR PROCEDUE WITH YOUR RESULTS. Do not assume everything is normal if you have not heard from DR. Seif Teichert, CALL HER OFFICE AT [phone].  SEEK IMMEDIATE MEDICAL ATTENTION AND CALL THE OFFICE: [phone] IF:  You have more than a spotting of blood in your stool.   Your belly is swollen (abdominal distention).   You are nauseated or vomiting.   You have a temperature over 101F.   You have abdominal pain or discomfort that is severe or gets worse throughout the day.  High-Fiber Diet A high-fiber diet changes your normal diet to include more whole grains, legumes, fruits, and vegetables. Changes in the diet involve replacing refined carbohydrates with unrefined foods. The calorie level of the diet is essentially unchanged. The Dietary  Reference Intake (recommended amount) for adult males is 38 grams per day. For adult females, it is 25 grams per day. Pregnant and lactating women should consume 28 grams of fiber per day. Fiber is the intact part of a plant that is not broken down during digestion. Functional fiber is fiber that has been isolated from the plant to provide a beneficial effect in the body. PURPOSE  Increase stool bulk.   Ease and regulate bowel movements.   Lower cholesterol.   REDUCE RISK OF COLON CANCER  INDICATIONS THAT YOU  NEED MORE FIBER  Constipation and hemorrhoids.   Uncomplicated diverticulosis (intestine condition) and irritable bowel syndrome.   Weight management.   As a protective measure against hardening of the arteries (atherosclerosis), diabetes, and cancer.   GUIDELINES FOR INCREASING FIBER IN THE DIET  Start adding fiber to the diet slowly. A gradual increase of about 5 more grams (2 slices of whole-wheat bread, 2 servings of most fruits or vegetables, or 1 bowl of high-fiber cereal) per day is best. Too rapid an increase in fiber may result in constipation, flatulence, and bloating.   Drink enough water and fluids to keep your urine clear or pale yellow. Water, juice, or caffeine-free drinks are recommended. Not drinking enough fluid may cause constipation.   Eat a variety of high-fiber foods rather than one type of fiber.   Try to increase your intake of fiber through using high-fiber foods rather than fiber pills or supplements that contain small amounts of fiber.   The goal is to change the types of food eaten. Do not supplement your present diet with high-fiber foods, but replace foods in your present diet.   INCLUDE A VARIETY OF FIBER SOURCES  Replace refined and processed grains with whole grains, canned fruits with fresh fruits, and incorporate other fiber sources. White rice, white breads, and most bakery goods contain little or no fiber.   Brown whole-grain rice, buckwheat oats, and many fruits and vegetables are all good sources of fiber. These include: broccoli, Brussels sprouts, cabbage, cauliflower, beets, sweet potatoes, white potatoes (skin on), carrots, tomatoes, eggplant, squash, berries, fresh fruits, and dried fruits.   Cereals appear to be the richest source of fiber. Cereal fiber is found in whole grains and bran. Bran is the fiber-rich outer coat of cereal grain, which is largely removed in refining. In whole-grain cereals, the bran remains. In breakfast cereals, the  largest amount of fiber is found in those with "bran" in their names. The fiber content is sometimes indicated on the label.   You may need to include additional fruits and vegetables each day.   In baking, for 1 cup white flour, you may use the following substitutions:   1 cup whole-wheat flour minus 2 tablespoons.   1/2 cup white flour plus 1/2 cup whole-wheat flour.   Polyps, Colon  A polyp is extra tissue that grows inside your body. Colon polyps grow in the large intestine. The large intestine, also called the colon, is part of your digestive system. It is a long, hollow tube at the end of your digestive tract where your body makes and stores stool. Most polyps are not dangerous. They are benign. This means they are not cancerous. But over time, some types of polyps can turn into cancer. Polyps that are smaller than a pea are usually not harmful. But larger polyps could someday become or may already be cancerous. To be safe, doctors remove all polyps and test them.   PREVENTION  There is not one sure way to prevent polyps. You might be able to lower your risk of getting them if you:  Eat more fruits and vegetables and less fatty food.   Do not smoke.   Avoid alcohol.   Exercise every day.   Lose weight if you are overweight.   Eating more calcium and folate can also lower your risk of getting polyps. Some foods that are rich in calcium are milk, cheese, and broccoli. Some foods that are rich in folate are chickpeas, kidney beans, and spinach.    Diverticulosis Diverticulosis is a common condition that develops when small pouches (diverticula) form in the wall of the colon. The risk of diverticulosis increases with age. It happens more often in people who eat a low-fiber diet. Most individuals with diverticulosis have no symptoms. Those individuals with symptoms usually experience belly (abdominal) pain, constipation, or loose stools (diarrhea).  HOME CARE INSTRUCTIONS  Increase  the amount of fiber in your diet as directed by your caregiver or dietician. This may reduce symptoms of diverticulosis.   Drink at least 6 to 8 glasses of water each day to prevent constipation.   Try not to strain when you have a bowel movement.   Avoiding nuts and seeds to prevent complications is NOT NECESSARY.   FOODS HAVING HIGH FIBER CONTENT INCLUDE:  Fruits. Apple, peach, pear, tangerine, raisins, prunes.   Vegetables. Brussels sprouts, asparagus, broccoli, cabbage, carrot, cauliflower, romaine lettuce, spinach, summer squash, tomato, winter squash, zucchini.   Starchy Vegetables. Baked beans, kidney beans, lima beans, split peas, lentils, potatoes (with skin).   Grains. Whole wheat bread, brown rice, bran flake cereal, plain oatmeal, white rice, shredded wheat, bran muffins.   SEEK IMMEDIATE MEDICAL CARE IF:  You develop increasing pain or severe bloating.   You have an oral temperature above 101F.   You develop vomiting or bowel movements that are bloody or black.   Hemorrhoids Hemorrhoids are dilated (enlarged) veins around the rectum. Sometimes clots will form in the veins. This makes them swollen and painful. These are called thrombosed hemorrhoids. Causes of hemorrhoids include:  Constipation.   Straining to have a bowel movement.   HEAVY LIFTING   HOME CARE INSTRUCTIONS  Eat a well balanced diet and drink 6 to 8 glasses of water every day to avoid constipation. You may also use a bulk laxative.   Avoid straining to have bowel movements.   Keep anal area dry and clean.   Do not use a donut shaped pillow or sit on the toilet for long periods. This increases blood pooling and pain.   Move your bowels when your body has the urge; this will require less straining and will decrease pain and pressure.

## 2019-04-29 NOTE — Op Note (Signed)
[facility] Patient Name: Stephanie Hammond Procedure Date: 04/29/2019 10:58 AM MRN: [id number] Date of Birth: 1951/06/14 Attending MD: Jonette Eva MD, MD CSN: [id number] Age: 68 Admit Type: Outpatient Procedure:                Colonoscopy WITH COLD SNARE POLYPECTOMY Indications:              Abnormal CT of the GI tract: SEP 2020 POSSIBLE                            COLOVAGINAL FISTULA. DIVERTICULA TETHERED TO                            BLADDER AND SMALL BOWEL I PERSONALLY REVIEWED CT                            WITH RADIOLOGY(DR. Tyron Russell). Providers:                Jonette Eva MD, MD, Criselda Peaches. Patsy Lager, RN, Dyann Ruddle Referring MD:             Velna Hatchet. [facility] Medicines:                Propofol per Anesthesia Complications:            No immediate complications. Estimated Blood Loss:     Estimated blood loss was minimal. Procedure:                Pre-Anesthesia Assessment:                           - Prior to the procedure, a History and Physical                            was performed, and patient medications and                            allergies were reviewed. The patient's tolerance of                            previous anesthesia was also reviewed. The risks                            and benefits of the procedure and the sedation                            options and risks were discussed with the patient.                            All questions were answered, and informed consent                            was obtained. Prior Anticoagulants: The patient has  taken no previous anticoagulant or antiplatelet                            agents. ASA Grade Assessment: II - A patient with                            mild systemic disease. After reviewing the risks                            and benefits, the patient was deemed in                            satisfactory condition to undergo the procedure.           After obtaining informed consent, the colonoscope                            was passed under direct vision. Throughout the                            procedure, the patient's blood pressure, pulse, and                            oxygen saturations were monitored continuously. The                            [id number]-[id number] ([id number]) scope was introduced through                            the anus and advanced to the the cecum, identified                            by appendiceal orifice and ileocecal valve. The                            colonoscopy was performed with difficulty due to                            multiple diverticula in the colon, restricted                            mobility of the colon and a redundant colon.                            Successful completion of the procedure was aided by                            changing endoscopes, straightening and shortening                            the scope to obtain bowel loop reduction and                            COLOWRAP. The patient tolerated the procedure  well.                            The quality of the bowel preparation was good. The                            ileocecal valve, appendiceal orifice, and rectum                            were photographed. Scope In: 11:48:37 AM Scope Out: 12:32:43 PM Scope Withdrawal Time: 0 hours 14 minutes 37 seconds  Total Procedure Duration: 0 hours 44 minutes 6 seconds  Findings:      A 6 mm polyp was found in the ascending colon. The polyp was sessile.       The polyp was removed with a cold snare. Resection and retrieval were       complete.      Multiple small and large-mouthed diverticula were found in the entire       colon.      The recto-sigmoid colon and sigmoid colon were grossly tortuous.      External and internal hemorrhoids were found. The hemorrhoids were       moderate. Impression:               - One 6 mm polyp in the ascending colon, removed                             with a cold snare. Resected and retrieved.                           - SEVERE PAN-COLONIC Diverticulosis WITH RESTRICTED                            MOBILITY F THE RECTSIGMOID COLON. UNABLE TO                            APPRECIATE COLOVAGINAL FISTULA. NO MASS.                           - Tortuous RECTOSIGMOID colon REQUIRING CHANGE TO                            ULTRASLIM SCOPE.                           - External and internal hemorrhoids. Moderate Sedation:      Per Anesthesia Care Recommendation:           - Patient has a contact number available for                            emergencies. The signs and symptoms of potential                            delayed complications were discussed with the  patient. Return to normal activities tomorrow.                            Written discharge instructions were provided to the                            patient.                           - High fiber diet. CONTINUE WEIGHT LOSS EFFORTS.                           - Continue present medications.                           - Await pathology results. INITIAL CT WITH CONTRAST                            INJECTED INTO THE VAGINA INSTEAD OF THE RECTUM.                            NEED REPEAT CT OF PELVIS WITH RECTAL CONTRAST ONLY.                           - Repeat colonoscopy in 5-10 years for surveillance.                           - Refer to a colo-rectal surgeon at the next                            available appointment TO DISCUSS PARTIAL COLECTOMY.                           - Return to my office in 4 months. Procedure Code(s):        --- Professional ---                           [phone], Colonoscopy, flexible; with removal of                            tumor(s), polyp(s), or other lesion(s) by snare                            technique Diagnosis Code(s):        --- Professional ---                           K63.5, Polyp of colon                           K64.8,  Other hemorrhoids                           K57.30, Diverticulosis of large intestine without  perforation or abscess without bleeding                           R93.3, Abnormal findings on diagnostic imaging of                            other parts of digestive tract                           Q43.8, Other specified congenital malformations of                            intestine CPT copyright 2019 American Medical Association. All rights reserved. The codes documented in this report are preliminary and upon coder review may  be revised to meet current compliance requirements. Jonette Eva, MD Jonette Eva MD, MD 04/29/2019 12:56:32 PM This report has been signed electronically. Number of Addenda: 0

## 2019-04-30 ENCOUNTER — Telehealth: Payer: Self-pay | Admitting: Gastroenterology

## 2019-04-30 LAB — SURGICAL PATHOLOGY

## 2019-04-30 NOTE — Telephone Encounter (Signed)
See results of path. Pt is aware.

## 2019-04-30 NOTE — Telephone Encounter (Signed)
See prior notes. Referral has been done

## 2019-04-30 NOTE — Telephone Encounter (Signed)
Returning a call.  [phone]

## 2019-04-30 NOTE — Telephone Encounter (Signed)
Pt returning call. [phone]

## 2019-04-30 NOTE — Telephone Encounter (Signed)
PLEASE CALL PT. SHE HAD ONE SIMPLE ADENOMA REMOVED.   COMPLETE THE PELVIC CT. SEE GENERAL SURGERY AT [facility] TO HAVE THE CONNECTION BETWEEN YOUR COLON VAGINA & BLADDER REMOVED.  DRINK WATER TO KEEP YOUR URINE LIGHT YELLOW. CONTINUE YOUR WEIGHT LOSS EFFORTS.  FOLLOW A HIGH FIBER DIET. AVOID ITEMS THAT CAUSE BLOATING.   FOLLOW UP IN 4 MOS.  Next colonoscopy in AS SOON AS SEP 2025 OR AS LATE AS SEP 2027.

## 2019-04-30 NOTE — Telephone Encounter (Signed)
PT aware. Forwarding to [facility] to make referral.

## 2019-04-30 NOTE — Telephone Encounter (Signed)
Called, mailbox full and could not leave a message.

## 2019-04-30 NOTE — Telephone Encounter (Signed)
Called and mailbox full, could not leave a message.  

## 2019-05-01 ENCOUNTER — Telehealth: Payer: Self-pay

## 2019-05-01 NOTE — Telephone Encounter (Signed)
PATIENT SCHEDULED AND ON RECALL  °

## 2019-05-01 NOTE — Telephone Encounter (Signed)
PA still pending review.

## 2019-05-01 NOTE — Telephone Encounter (Signed)
Pt left Vm to see if referral was made to surgeon. It was made as ASAP appt to surgeon. I called pt back and VM mailbox full and could not leave a message.

## 2019-05-01 NOTE — Telephone Encounter (Signed)
Noted  

## 2019-05-02 ENCOUNTER — Encounter (HOSPITAL_COMMUNITY): Payer: Self-pay | Admitting: Gastroenterology

## 2019-05-02 NOTE — Telephone Encounter (Signed)
Checked PA, still pending auth

## 2019-05-05 NOTE — Telephone Encounter (Signed)
Received denial from insurance. Per notes " Upon review, the medical director is unable to approve the service requested based on guidelines. Your records show that you have or may have a tunnels that runs from your colon to your vagina. The reason this request cannot be approved is because: 1. The clinical information reviewed shows that the same test or one similar to the requested study was previously performed. The results of this prior imaging were provided, but they do not show thy these results are not sufficient for the evaluation of the current clinical condition. Additional imaging is not supported without a clear reason why it is needed and, therefore, the request is not indicated at this time."  You may speak with the medical director by calling [phone], select option #4, and provide reference # [id number] by 05/07/2019.

## 2019-05-05 NOTE — Telephone Encounter (Signed)
Pt called back and is aware that referral has been made for ASAP appt.

## 2019-05-05 NOTE — Telephone Encounter (Signed)
Checked PA, still pending

## 2019-05-06 NOTE — Telephone Encounter (Signed)
CALLED INSURANCE. EXPLAINED WHY CT PELVIS IS NEEDED. ADDED CLINICAL INFORMATION INCLUDING PERSONALLY REVIEWING CT WITH RADIOLOGY AND DISAGREEMENT AMONGST RADIOLOGIST. SPOKE TO [place]. NEEDS FOLLOW UP VIA FAX OR PHONE OR CHECK STATUS VIA INTERNET.  PLEASE CALL PT. LET HE KNOW I SPOKE WITH MEDICARE AND WE ARE WAITING FOR APPROVAL/DENIAL FROM MEDICARE.

## 2019-05-06 NOTE — Telephone Encounter (Signed)
Patient called back and I made her aware of below

## 2019-05-06 NOTE — Telephone Encounter (Signed)
LMOVM for pt 

## 2019-05-07 ENCOUNTER — Telehealth: Payer: Self-pay | Admitting: *Deleted

## 2019-05-07 ENCOUNTER — Other Ambulatory Visit: Payer: Self-pay | Admitting: *Deleted

## 2019-05-07 MED ORDER — OXYCODONE-ACETAMINOPHEN 7.5-325 MG PO TABS: 1.0000 | ORAL_TABLET | Freq: Four times a day (QID) | ORAL | 0 refills | Status: DC | PRN

## 2019-05-07 NOTE — Telephone Encounter (Signed)
Has question about a letter she received.  [phone]

## 2019-05-07 NOTE — Telephone Encounter (Signed)
meds must last 30 days, I will refill early this time

## 2019-05-07 NOTE — Telephone Encounter (Signed)
Received call from patient.   Requested refill on Oxycodone/APAP.   Ok to refill??  Last office visit 10/09/2018.  Last refill 04/15/2019.  Reports that she has been having increased pain d/t rectovaginal fistula, and she has taken medication more frequently.

## 2019-05-08 ENCOUNTER — Telehealth: Payer: Self-pay | Admitting: Gastroenterology

## 2019-05-08 NOTE — Telephone Encounter (Signed)
SPOKE WITH [employer]. NEED TO COMPLETE [email or web address] TO FILL OUT APPEAL FORM. WILL APPEAL.

## 2019-05-08 NOTE — Telephone Encounter (Signed)
SPOKE WITH INSURANCE COMPANY([employer]). AFTER EXAM DENIED NEED TO FILL OUT [employer] MEDIC[employer] APPEAL FORM. IT IS COMPLETE AND FEED ON OCT 1.

## 2019-05-08 NOTE — Telephone Encounter (Signed)
Call placed to patient and patient made aware per VM.  

## 2019-05-08 NOTE — Telephone Encounter (Signed)
Form sent in

## 2019-05-08 NOTE — Telephone Encounter (Signed)
Pt was returning a call. [phone]

## 2019-05-08 NOTE — Telephone Encounter (Signed)
Pt is aware she needs to discuss elevated glucose with Dr. Buelah Manis and she has an appointment with her on 05/13/2019.

## 2019-05-08 NOTE — Telephone Encounter (Signed)
Received denial for CT. Reason is that the same test or one similar was previously performed. The results of this prior imagine were provided, but it does not show why these results are not sufficient for the eval of the current clinical condition. Additional imaging is not supported.

## 2019-05-08 NOTE — Telephone Encounter (Signed)
LMOM that Dr. Oneida Alar wants her to discuss the message with Dr. Buelah Manis and to please call back if she has further questions.

## 2019-05-09 ENCOUNTER — Telehealth: Payer: Self-pay | Admitting: *Deleted

## 2019-05-09 NOTE — Telephone Encounter (Signed)
[employer] provided form to submit for appeal for CT pelvis denial. This was faxed yesterday. I called [facility] provider services [phone]. I was advised this was the incorrect form. I was advised to submit appeal online at [employer] for denial for pre certification. This was located on website and submitted.

## 2019-05-12 NOTE — Telephone Encounter (Signed)
CT scheduled for 10/7 at 11:00am, arrival time 10:45am, liquids only 4 hours prior.  Called pt, LMOVM

## 2019-05-12 NOTE — Telephone Encounter (Signed)
Checked [employer] website and appeal for CT pelvis wo has been approved. Auth# [id number] Dates 05/10/2019-11/06/2019

## 2019-05-12 NOTE — Telephone Encounter (Signed)
Received VM from [facility] with [employer]. Expedited appeal # [id number]. We should received decision within 72 hrs.

## 2019-05-12 NOTE — Telephone Encounter (Signed)
Patient called back. Made aware of CT pelvis scheduled for 10/7 at 11:00am, arrival 10:45am, liquids ONLY 4 hours prior.  Patient states this isn't going to work and has been provided CS # to r/s.

## 2019-05-13 ENCOUNTER — Ambulatory Visit (INDEPENDENT_AMBULATORY_CARE_PROVIDER_SITE_OTHER): Payer: Medicare HMO | Admitting: Family Medicine

## 2019-05-13 ENCOUNTER — Encounter: Payer: Self-pay | Admitting: Family Medicine

## 2019-05-13 ENCOUNTER — Other Ambulatory Visit: Payer: Self-pay

## 2019-05-13 ENCOUNTER — Ambulatory Visit: Payer: Self-pay | Admitting: Family Medicine

## 2019-05-13 VITALS — BP 134/68 | HR 86 | Temp 98.6°F | Resp 14 | Ht 63.0 in | Wt 196.0 lb

## 2019-05-13 DIAGNOSIS — E1140 Type 2 diabetes mellitus with diabetic neuropathy, unspecified: Secondary | ICD-10-CM

## 2019-05-13 DIAGNOSIS — Z794 Long term (current) use of insulin: Secondary | ICD-10-CM | POA: Diagnosis not present

## 2019-05-13 DIAGNOSIS — E1141 Type 2 diabetes mellitus with diabetic mononeuropathy: Secondary | ICD-10-CM

## 2019-05-13 DIAGNOSIS — N824 Other female intestinal-genital tract fistulae: Secondary | ICD-10-CM

## 2019-05-13 DIAGNOSIS — I10 Essential (primary) hypertension: Secondary | ICD-10-CM | POA: Diagnosis not present

## 2019-05-13 DIAGNOSIS — Z720 Tobacco use: Secondary | ICD-10-CM

## 2019-05-13 DIAGNOSIS — I2510 Atherosclerotic heart disease of native coronary artery without angina pectoris: Secondary | ICD-10-CM

## 2019-05-13 NOTE — Assessment & Plan Note (Signed)
The setting of diverticulosis.  She has follow-up with general surgery.  She is aware that her pain medications must last every 30 days.  She was on anti-inflammatories and came off in the setting of her fistula walking her renal function I recommend that she stay off of the anti-inflammatory at this time

## 2019-05-13 NOTE — Assessment & Plan Note (Addendum)
She has very labile blood sugars.  Her A1c also does not reflect how high her blood sugars go up to.  We will recheck her A1c goal is to keep it less than 7% she will continue Lantus 12 units as well as her metformin  Discussed getting an eye appointment in the future however and right now we will focus on the fistula and getting this operated on.

## 2019-05-13 NOTE — Assessment & Plan Note (Signed)
Is controlled no change in medications.  Is on statin drug for her diabetes and her coronary artery disease history.

## 2019-05-13 NOTE — Patient Instructions (Addendum)
Stop the meloxicam  F/U 4 months Physical

## 2019-05-13 NOTE — Progress Notes (Signed)
Subjective:    Patient ID: Stephanie Hammond, female    DOB: 05-17-1951, 68 y.o.   MRN: [id number]  Patient presents for Follow-up (CBG elevated)    Pt here to f/u chronic medical problems  She was recently evaluated for ongoing vaginal discharge, she had CT scan that showed colonvaginal fistula, in the setting of severe diverticulosis., also felt that bladder may be involved   She has significant pain from the fistula  She was referred to Dr. Byrd Hesselbach at [facility] , she has appt Oct 29th  She also had recent left knee replacement with Dr. Veda Canning  she completed    DM- last A1c 5.9%, currently  , when she had a procedure done was up 265, this AM 129  lANTUS 12 units / Metformin 1000MG  xl ONCE A DAY    HTN - Taking norvasc /   CAD- taking lipitor     Declines flu shot   Review Of Systems:  GEN- denies fatigue, fever, weight loss,weakness, recent illness HEENT- denies eye drainage, change in vision, nasal discharge, CVS- denies chest pain, palpitations RESP- denies SOB, cough, wheeze ABD- denies N/V, change in stools, abd pain GU- denies dysuria, hematuria, dribbling, incontinence MSK- denies joint pain, muscle aches, injury Neuro- denies headache, dizziness, syncope, seizure activity       Objective:    BP 134/68   Pulse 86   Temp 98.6 F (37 C) (Oral)   Resp 14   Ht 5\' 3"  (1.6 m)   Wt 196 lb (88.9 kg)   SpO2 98%   BMI 34.72 kg/m  GEN- NAD, alert and oriented x3 HEENT- PERRL, EOMI, non injected sclera, pink conjunctiva, MMM, oropharynx clear Neck- Supple, no thyromegaly CVS- RRR, no murmur RESP-CTAB ABD-NABS,soft,NT,ND EXT- No edema Pulses- Radial, DP- 2+        Assessment & Plan:      Problem List Items Addressed This Visit      Unprioritized   CAD (coronary artery disease) - Primary   Relevant Orders   CBC with Differential/Platelet   Comprehensive metabolic panel   Colovaginal fistula    The setting of diverticulosis.  She has follow-up with  general surgery.  She is aware that her pain medications must last every 30 days.  She was on anti-inflammatories and came off in the setting of her fistula walking her renal function I recommend that she stay off of the anti-inflammatory at this time      Diabetic neuropathy (HCC)   Essential hypertension, benign    Is controlled no change in medications.  Is on statin drug for her diabetes and her coronary artery disease history.      Relevant Orders   Lipid panel   Tobacco user    She continues to smoke.  She is not ready to quit.  She declines influenza and pneumonia vaccine      Type 2 diabetes mellitus with diabetic neuropathy (HCC)    She has very labile blood sugars.  Her A1c also does not reflect how high her blood sugars go up to.  We will recheck her A1c goal is to keep it less than 7% she will continue Lantus 12 units as well as her metformin  Discussed getting an eye appointment in the future however and right now we will focus on the fistula and getting this operated on.      Relevant Orders   Hemoglobin A1c   Lipid panel   HM DIABETES FOOT EXAM (Completed)  Note: This dictation was prepared with Dragon dictation along with smaller phrase technology. Any transcriptional errors that result from this process are unintentional.

## 2019-05-13 NOTE — Assessment & Plan Note (Signed)
She continues to smoke.  She is not ready to quit.  She declines influenza and pneumonia vaccine

## 2019-05-14 ENCOUNTER — Ambulatory Visit (HOSPITAL_COMMUNITY): Payer: Medicare HMO

## 2019-05-14 LAB — HEMOGLOBIN A1C
Hgb A1c MFr Bld: 5.2 % of total Hgb (ref <5.7)
Mean Plasma Glucose: 103 (calc)
eAG (mmol/L): 5.7 (calc)

## 2019-05-14 LAB — CBC WITH DIFFERENTIAL/PLATELET
Absolute Monocytes: 497 cells/uL (ref 200–950)
Basophils Absolute: 70 cells/uL (ref 0–200)
Basophils Relative: 1 %
Eosinophils Absolute: 238 cells/uL (ref 15–500)
Eosinophils Relative: 3.4 %
HCT: 34.2 % — ABNORMAL LOW (ref 35.0–45.0)
Hemoglobin: 11.4 g/dL — ABNORMAL LOW (ref 11.7–15.5)
Lymphs Abs: 2149 cells/uL (ref 850–3900)
MCH: 31.3 pg (ref 27.0–33.0)
MCHC: 33.3 g/dL (ref 32.0–36.0)
MCV: 94 fL (ref 80.0–100.0)
MPV: 10.7 fL (ref 7.5–12.5)
Monocytes Relative: 7.1 %
Neutro Abs: 4046 cells/uL (ref 1500–7800)
Neutrophils Relative %: 57.8 %
Platelets: 349 10*3/uL (ref 140–400)
RBC: 3.64 10*6/uL — ABNORMAL LOW (ref 3.80–5.10)
RDW: 14.8 % (ref 11.0–15.0)
Total Lymphocyte: 30.7 %
WBC: 7 10*3/uL (ref 3.8–10.8)

## 2019-05-14 LAB — LIPID PANEL
Cholesterol: 102 mg/dL (ref <200)
HDL: 41 mg/dL — ABNORMAL LOW (ref >=50)
LDL Cholesterol (Calc): 40 mg/dL (calc)
Non-HDL Cholesterol (Calc): 61 mg/dL (calc) (ref <130)
Total CHOL/HDL Ratio: 2.5 (calc) (ref <5.0)
Triglycerides: 127 mg/dL (ref <150)

## 2019-05-14 LAB — COMPREHENSIVE METABOLIC PANEL
AG Ratio: 1.3 (calc) (ref 1.0–2.5)
ALT: 14 U/L (ref 6–29)
AST: 15 U/L (ref 10–35)
Albumin: 4.1 g/dL (ref 3.6–5.1)
Alkaline phosphatase (APISO): 108 U/L (ref 37–153)
BUN/Creatinine Ratio: 21 (calc) (ref 6–22)
BUN: 21 mg/dL (ref 7–25)
CO2: 21 mmol/L (ref 20–32)
Calcium: 9.6 mg/dL (ref 8.6–10.4)
Chloride: 109 mmol/L (ref 98–110)
Creat: 1 mg/dL — ABNORMAL HIGH (ref 0.50–0.99)
Globulin: 3.1 g/dL (calc) (ref 1.9–3.7)
Glucose, Bld: 84 mg/dL (ref 65–99)
Potassium: 5.2 mmol/L (ref 3.5–5.3)
Sodium: 139 mmol/L (ref 135–146)
Total Bilirubin: 0.3 mg/dL (ref 0.2–1.2)
Total Protein: 7.2 g/dL (ref 6.1–8.1)

## 2019-05-15 NOTE — Telephone Encounter (Signed)
REVIEWED-NO ADDITIONAL RECOMMENDATIONS. 

## 2019-05-15 NOTE — Telephone Encounter (Signed)
Patient r/s'd CT appt to 10/9 and has now cancelled this appt as well. FYI to [facility]

## 2019-05-15 NOTE — Telephone Encounter (Signed)
Called patient TO DISCUSS case with radiology(JENNIFER). AWARE OF CONTRAST ERROR ON PREVIOUS CT. WILL BE VIGILANT ABOUT INJECTING CONTRAST INTO THE RECTUM.

## 2019-05-16 ENCOUNTER — Ambulatory Visit (HOSPITAL_COMMUNITY): Payer: Medicare HMO

## 2019-05-19 ENCOUNTER — Ambulatory Visit: Payer: Medicare HMO | Admitting: Family Medicine

## 2019-05-20 ENCOUNTER — Ambulatory Visit: Payer: Medicare HMO | Admitting: Family Medicine

## 2019-05-21 ENCOUNTER — Telehealth: Payer: Self-pay | Admitting: Gastroenterology

## 2019-05-21 NOTE — Telephone Encounter (Signed)
Pt said she was returning a call. [phone]

## 2019-05-22 NOTE — Telephone Encounter (Signed)
LMOM for a return call. ( see separate note, letter was mailed with info of results).

## 2019-06-03 ENCOUNTER — Other Ambulatory Visit: Payer: Self-pay | Admitting: *Deleted

## 2019-06-03 MED ORDER — OXYCODONE-ACETAMINOPHEN 7.5-325 MG PO TABS: 1.0000 | ORAL_TABLET | Freq: Four times a day (QID) | ORAL | 0 refills | Status: DC | PRN

## 2019-06-03 NOTE — Telephone Encounter (Signed)
Received call from patient.   Requested refill on Oxycodone/APAP.   Ok to refill??  Last office visit 05/13/2019.  Last refill 05/07/2019.

## 2019-06-05 DIAGNOSIS — R3989 Other symptoms and signs involving the genitourinary system: Secondary | ICD-10-CM | POA: Diagnosis not present

## 2019-06-05 DIAGNOSIS — N321 Vesicointestinal fistula: Secondary | ICD-10-CM | POA: Diagnosis not present

## 2019-06-05 DIAGNOSIS — N823 Fistula of vagina to large intestine: Secondary | ICD-10-CM | POA: Diagnosis not present

## 2019-06-05 DIAGNOSIS — N824 Other female intestinal-genital tract fistulae: Secondary | ICD-10-CM | POA: Diagnosis not present

## 2019-06-05 DIAGNOSIS — K5792 Diverticulitis of intestine, part unspecified, without perforation or abscess without bleeding: Secondary | ICD-10-CM | POA: Diagnosis not present

## 2019-06-20 ENCOUNTER — Other Ambulatory Visit (HOSPITAL_COMMUNITY): Payer: Medicare HMO

## 2019-06-30 ENCOUNTER — Other Ambulatory Visit: Payer: Self-pay | Admitting: *Deleted

## 2019-06-30 MED ORDER — OXYCODONE-ACETAMINOPHEN 7.5-325 MG PO TABS: 1.0000 | ORAL_TABLET | Freq: Four times a day (QID) | ORAL | 0 refills | Status: DC | PRN

## 2019-06-30 NOTE — Telephone Encounter (Signed)
Received call from patient.   Requested refill on Oxycodone/APAP.  Ok to refill??  Last office visit 05/13/2019.  Last refill 06/03/2019.

## 2019-07-16 ENCOUNTER — Other Ambulatory Visit: Payer: Self-pay | Admitting: Family Medicine

## 2019-07-17 ENCOUNTER — Other Ambulatory Visit: Payer: Self-pay | Admitting: Family Medicine

## 2019-07-17 DIAGNOSIS — N824 Other female intestinal-genital tract fistulae: Secondary | ICD-10-CM | POA: Diagnosis not present

## 2019-07-17 DIAGNOSIS — K5792 Diverticulitis of intestine, part unspecified, without perforation or abscess without bleeding: Secondary | ICD-10-CM | POA: Diagnosis not present

## 2019-07-17 DIAGNOSIS — E860 Dehydration: Secondary | ICD-10-CM | POA: Diagnosis not present

## 2019-07-17 DIAGNOSIS — N321 Vesicointestinal fistula: Secondary | ICD-10-CM | POA: Diagnosis not present

## 2019-07-17 DIAGNOSIS — K567 Ileus, unspecified: Secondary | ICD-10-CM | POA: Diagnosis not present

## 2019-07-17 DIAGNOSIS — N3289 Other specified disorders of bladder: Secondary | ICD-10-CM | POA: Diagnosis not present

## 2019-07-17 DIAGNOSIS — D62 Acute posthemorrhagic anemia: Secondary | ICD-10-CM | POA: Diagnosis not present

## 2019-07-17 DIAGNOSIS — R000 Tachycardia, unspecified: Secondary | ICD-10-CM | POA: Diagnosis not present

## 2019-07-17 DIAGNOSIS — E876 Hypokalemia: Secondary | ICD-10-CM | POA: Diagnosis not present

## 2019-07-17 DIAGNOSIS — R569 Unspecified convulsions: Secondary | ICD-10-CM | POA: Diagnosis not present

## 2019-07-17 DIAGNOSIS — K5730 Diverticulosis of large intestine without perforation or abscess without bleeding: Secondary | ICD-10-CM | POA: Diagnosis not present

## 2019-07-17 DIAGNOSIS — J9811 Atelectasis: Secondary | ICD-10-CM | POA: Diagnosis not present

## 2019-07-22 ENCOUNTER — Other Ambulatory Visit: Payer: Self-pay | Admitting: Family Medicine

## 2019-07-25 ENCOUNTER — Other Ambulatory Visit: Payer: Self-pay | Admitting: Family Medicine

## 2019-07-25 DIAGNOSIS — R69 Illness, unspecified: Secondary | ICD-10-CM | POA: Diagnosis not present

## 2019-07-29 ENCOUNTER — Other Ambulatory Visit: Payer: Self-pay | Admitting: *Deleted

## 2019-07-29 MED ORDER — OXYCODONE-ACETAMINOPHEN 7.5-325 MG PO TABS: 1.0000 | ORAL_TABLET | Freq: Four times a day (QID) | ORAL | 0 refills | Status: DC | PRN

## 2019-07-29 NOTE — Telephone Encounter (Signed)
Received call from patient.   Requested refill on Oxycodone/APAP.   Ok to refill??  Last office visit 05/13/2019.  Last refill 06/30/2019.

## 2019-07-31 ENCOUNTER — Other Ambulatory Visit: Payer: Self-pay | Admitting: Family Medicine

## 2019-08-05 DIAGNOSIS — Z01812 Encounter for preprocedural laboratory examination: Secondary | ICD-10-CM | POA: Diagnosis not present

## 2019-08-05 DIAGNOSIS — N824 Other female intestinal-genital tract fistulae: Secondary | ICD-10-CM | POA: Diagnosis not present

## 2019-08-05 DIAGNOSIS — Z20828 Contact with and (suspected) exposure to other viral communicable diseases: Secondary | ICD-10-CM | POA: Diagnosis not present

## 2019-08-12 DIAGNOSIS — K5289 Other specified noninfective gastroenteritis and colitis: Secondary | ICD-10-CM | POA: Diagnosis not present

## 2019-08-12 DIAGNOSIS — N821 Other female urinary-genital tract fistulae: Secondary | ICD-10-CM | POA: Diagnosis not present

## 2019-08-12 DIAGNOSIS — E860 Dehydration: Secondary | ICD-10-CM | POA: Diagnosis not present

## 2019-08-12 DIAGNOSIS — N823 Fistula of vagina to large intestine: Secondary | ICD-10-CM | POA: Diagnosis not present

## 2019-08-12 DIAGNOSIS — J9811 Atelectasis: Secondary | ICD-10-CM | POA: Diagnosis not present

## 2019-08-12 DIAGNOSIS — Z6835 Body mass index (BMI) 35.0-35.9, adult: Secondary | ICD-10-CM | POA: Diagnosis not present

## 2019-08-12 DIAGNOSIS — D62 Acute posthemorrhagic anemia: Secondary | ICD-10-CM | POA: Diagnosis not present

## 2019-08-12 DIAGNOSIS — N824 Other female intestinal-genital tract fistulae: Secondary | ICD-10-CM | POA: Diagnosis not present

## 2019-08-12 DIAGNOSIS — K5732 Diverticulitis of large intestine without perforation or abscess without bleeding: Secondary | ICD-10-CM | POA: Diagnosis not present

## 2019-08-12 DIAGNOSIS — N3289 Other specified disorders of bladder: Secondary | ICD-10-CM | POA: Diagnosis not present

## 2019-08-12 DIAGNOSIS — E876 Hypokalemia: Secondary | ICD-10-CM | POA: Diagnosis not present

## 2019-08-12 DIAGNOSIS — R188 Other ascites: Secondary | ICD-10-CM | POA: Diagnosis not present

## 2019-08-12 DIAGNOSIS — K567 Ileus, unspecified: Secondary | ICD-10-CM | POA: Diagnosis not present

## 2019-08-12 DIAGNOSIS — N321 Vesicointestinal fistula: Secondary | ICD-10-CM | POA: Diagnosis not present

## 2019-08-12 DIAGNOSIS — R000 Tachycardia, unspecified: Secondary | ICD-10-CM | POA: Diagnosis not present

## 2019-08-12 DIAGNOSIS — E669 Obesity, unspecified: Secondary | ICD-10-CM | POA: Diagnosis not present

## 2019-08-12 DIAGNOSIS — K5730 Diverticulosis of large intestine without perforation or abscess without bleeding: Secondary | ICD-10-CM | POA: Diagnosis not present

## 2019-08-12 DIAGNOSIS — E1169 Type 2 diabetes mellitus with other specified complication: Secondary | ICD-10-CM | POA: Diagnosis not present

## 2019-08-12 DIAGNOSIS — E1165 Type 2 diabetes mellitus with hyperglycemia: Secondary | ICD-10-CM | POA: Diagnosis not present

## 2019-08-12 DIAGNOSIS — R918 Other nonspecific abnormal finding of lung field: Secondary | ICD-10-CM | POA: Diagnosis not present

## 2019-08-12 DIAGNOSIS — Z794 Long term (current) use of insulin: Secondary | ICD-10-CM | POA: Diagnosis not present

## 2019-08-12 DIAGNOSIS — Z9889 Other specified postprocedural states: Secondary | ICD-10-CM | POA: Diagnosis not present

## 2019-08-12 DIAGNOSIS — R569 Unspecified convulsions: Secondary | ICD-10-CM | POA: Diagnosis not present

## 2019-08-12 DIAGNOSIS — Z4659 Encounter for fitting and adjustment of other gastrointestinal appliance and device: Secondary | ICD-10-CM | POA: Diagnosis not present

## 2019-08-24 ENCOUNTER — Other Ambulatory Visit: Payer: Self-pay | Admitting: Family Medicine

## 2019-08-26 ENCOUNTER — Other Ambulatory Visit: Payer: Self-pay | Admitting: Family Medicine

## 2019-08-28 ENCOUNTER — Encounter: Payer: Self-pay | Admitting: Gastroenterology

## 2019-08-28 ENCOUNTER — Ambulatory Visit: Payer: Medicare HMO | Admitting: Gastroenterology

## 2019-08-28 ENCOUNTER — Telehealth: Payer: Self-pay | Admitting: Gastroenterology

## 2019-08-28 NOTE — Telephone Encounter (Signed)
Patient was a no show and letter sent  °

## 2019-08-28 NOTE — Telephone Encounter (Signed)
REVIEWED-NO ADDITIONAL RECOMMENDATIONS. 

## 2019-09-01 ENCOUNTER — Other Ambulatory Visit: Payer: Self-pay

## 2019-09-01 MED ORDER — OXYCODONE-ACETAMINOPHEN 7.5-325 MG PO TABS: 1.0000 | ORAL_TABLET | Freq: Four times a day (QID) | ORAL | 0 refills | Status: DC | PRN

## 2019-09-01 NOTE — Progress Notes (Signed)
Last refilled: 07/29/2019 Last office visit:05/13/2019

## 2019-09-11 DIAGNOSIS — Z48815 Encounter for surgical aftercare following surgery on the digestive system: Secondary | ICD-10-CM | POA: Diagnosis not present

## 2019-09-11 DIAGNOSIS — N321 Vesicointestinal fistula: Secondary | ICD-10-CM | POA: Diagnosis not present

## 2019-09-15 ENCOUNTER — Telehealth: Payer: Self-pay | Admitting: *Deleted

## 2019-09-15 MED ORDER — INSULIN GLARGINE 100 UNIT/ML ~~LOC~~ SOLN: 5.0000 [IU] | Freq: Every day | SUBCUTANEOUS | 0 refills | Status: DC

## 2019-09-15 NOTE — Telephone Encounter (Signed)
Received VM from patient.   States that her FSBS for the past 5 days have been 135, 195, 260, 190, 240.  MD please advise.

## 2019-09-15 NOTE — Telephone Encounter (Signed)
Received call from patient.   Reports that basal insulin was stopped in hospital after her surgery. States that she was taking 10U at night. States that her FSBS have been fluctuating since returning home. Ranges from 110- 240 fasting.   Of note, patient did not have her meter or her log with her. She will return call with readings.

## 2019-09-15 NOTE — Telephone Encounter (Signed)
Call placed to patient and patient made aware.   Appointment scheduled.  

## 2019-09-15 NOTE — Telephone Encounter (Signed)
Restart lantus at  5 units once a day  She needs OV in 3-4 weeks if not scheduled

## 2019-09-23 ENCOUNTER — Telehealth: Payer: Self-pay | Admitting: *Deleted

## 2019-09-23 NOTE — Telephone Encounter (Signed)
noted 

## 2019-09-23 NOTE — Telephone Encounter (Signed)
Received call from patient.   Reports that she has loss of taste. States that loss of taste was noted in [facility] after bowel surgery. Reports that she was advised that this is normal after general anesthesia and intubation, and it would go away on it's own. Reports that it has been weeks since her surgery, but loss of taste remains. States that she contacted hospital for recommendations, and she was advised to have COVID testing.   Reports that she does not feel that she has COVID, and she was tested multiple times prior to surgery. Advised that if any other Sx present, get tested for COVID.   Advised that loss of taste after general anesthesia is common and usually spontaneously corrects itself. Advised that there is no treatment options for loss of taste.  MD to be made aware.

## 2019-09-30 ENCOUNTER — Other Ambulatory Visit: Payer: Self-pay | Admitting: *Deleted

## 2019-09-30 MED ORDER — OXYCODONE-ACETAMINOPHEN 7.5-325 MG PO TABS: 1.0000 | ORAL_TABLET | Freq: Four times a day (QID) | ORAL | 0 refills | Status: DC | PRN

## 2019-09-30 NOTE — Telephone Encounter (Signed)
Received call from patient.   Requested refill on Oxycodone/APAP.   Ok to refill??  Last office visit 05/13/2019.  Last refill 09/01/2019.

## 2019-10-04 ENCOUNTER — Other Ambulatory Visit: Payer: Self-pay | Admitting: Family Medicine

## 2019-10-10 ENCOUNTER — Other Ambulatory Visit: Payer: Self-pay | Admitting: Family Medicine

## 2019-10-14 ENCOUNTER — Ambulatory Visit: Payer: Self-pay | Admitting: Family Medicine

## 2019-10-20 DIAGNOSIS — Z20828 Contact with and (suspected) exposure to other viral communicable diseases: Secondary | ICD-10-CM | POA: Diagnosis not present

## 2019-10-27 ENCOUNTER — Other Ambulatory Visit: Payer: Self-pay | Admitting: *Deleted

## 2019-10-27 MED ORDER — OXYCODONE-ACETAMINOPHEN 7.5-325 MG PO TABS: 1.0000 | ORAL_TABLET | Freq: Four times a day (QID) | ORAL | 0 refills | Status: DC | PRN

## 2019-10-27 NOTE — Telephone Encounter (Signed)
Received call from patient.   Requested refill on Oxycodone/APAP.   Ok to refill??  Last office visit 05/13/2019.  Last refill 09/30/2019.

## 2019-10-28 DIAGNOSIS — M1990 Unspecified osteoarthritis, unspecified site: Secondary | ICD-10-CM | POA: Diagnosis not present

## 2019-10-28 DIAGNOSIS — R32 Unspecified urinary incontinence: Secondary | ICD-10-CM | POA: Diagnosis not present

## 2019-10-28 DIAGNOSIS — E669 Obesity, unspecified: Secondary | ICD-10-CM | POA: Diagnosis not present

## 2019-10-28 DIAGNOSIS — E785 Hyperlipidemia, unspecified: Secondary | ICD-10-CM | POA: Diagnosis not present

## 2019-10-28 DIAGNOSIS — M109 Gout, unspecified: Secondary | ICD-10-CM | POA: Diagnosis not present

## 2019-10-28 DIAGNOSIS — I10 Essential (primary) hypertension: Secondary | ICD-10-CM | POA: Diagnosis not present

## 2019-10-28 DIAGNOSIS — G8929 Other chronic pain: Secondary | ICD-10-CM | POA: Diagnosis not present

## 2019-10-28 DIAGNOSIS — E1165 Type 2 diabetes mellitus with hyperglycemia: Secondary | ICD-10-CM | POA: Diagnosis not present

## 2019-10-28 DIAGNOSIS — K219 Gastro-esophageal reflux disease without esophagitis: Secondary | ICD-10-CM | POA: Diagnosis not present

## 2019-10-28 DIAGNOSIS — Z7984 Long term (current) use of oral hypoglycemic drugs: Secondary | ICD-10-CM | POA: Diagnosis not present

## 2019-10-29 ENCOUNTER — Ambulatory Visit: Payer: Medicare HMO | Admitting: Family Medicine

## 2019-11-05 ENCOUNTER — Ambulatory Visit: Payer: Self-pay | Admitting: Nurse Practitioner

## 2019-11-10 ENCOUNTER — Other Ambulatory Visit: Payer: Self-pay

## 2019-11-10 ENCOUNTER — Encounter: Payer: Self-pay | Admitting: Family Medicine

## 2019-11-10 ENCOUNTER — Ambulatory Visit (INDEPENDENT_AMBULATORY_CARE_PROVIDER_SITE_OTHER): Payer: Medicare HMO | Admitting: Family Medicine

## 2019-11-10 VITALS — BP 138/84 | HR 98 | Temp 97.8°F | Resp 14 | Ht 63.0 in | Wt 197.0 lb

## 2019-11-10 DIAGNOSIS — Z794 Long term (current) use of insulin: Secondary | ICD-10-CM | POA: Diagnosis not present

## 2019-11-10 DIAGNOSIS — M5136 Other intervertebral disc degeneration, lumbar region: Secondary | ICD-10-CM | POA: Diagnosis not present

## 2019-11-10 DIAGNOSIS — E1140 Type 2 diabetes mellitus with diabetic neuropathy, unspecified: Secondary | ICD-10-CM

## 2019-11-10 DIAGNOSIS — I2510 Atherosclerotic heart disease of native coronary artery without angina pectoris: Secondary | ICD-10-CM | POA: Diagnosis not present

## 2019-11-10 DIAGNOSIS — E785 Hyperlipidemia, unspecified: Secondary | ICD-10-CM | POA: Diagnosis not present

## 2019-11-10 DIAGNOSIS — I10 Essential (primary) hypertension: Secondary | ICD-10-CM

## 2019-11-10 DIAGNOSIS — E1141 Type 2 diabetes mellitus with diabetic mononeuropathy: Secondary | ICD-10-CM | POA: Diagnosis not present

## 2019-11-10 NOTE — Patient Instructions (Signed)
F/U 4 months for Physical  

## 2019-11-10 NOTE — Assessment & Plan Note (Signed)
Chronic back pain knee pain she is on oxycodone acetaminophen for chronic pain.

## 2019-11-10 NOTE — Assessment & Plan Note (Addendum)
No current symptoms of unstable angina.  Blood pressure looks okay today no changes to medication.  She is on statin drug. She has been tobacco free since October

## 2019-11-10 NOTE — Assessment & Plan Note (Signed)
Recheck A1c though it is difficult to manage her blood sugars because they fluctuate so much despite her A1c being normal.  Not had any hypoglycemia symptoms

## 2019-11-10 NOTE — Progress Notes (Signed)
Subjective:    Patient ID: Stephanie Hammond, female    DOB: 1950-09-13, 69 y.o.   MRN: [id number]  Patient presents for Follow-up (is not fasting)   DM- last A1c 5.2%, currently on  Lantus12 units / Metformin 1000MG  daily she did not bring her meter with her today but states that her blood sugar has been up and down as usual   HTN - Taking norvasc, losartan    CAD- taking lipitor , no chest pain   She quit smoking Oct 30th.    She is s/p fistula surgery. Has appt 4/15 with Dr. Byrd Hesselbach. She is taking percocet. No longer requires any diarrheal meds  Review Of Systems:  GEN- denies fatigue, fever, weight loss,weakness, recent illness HEENT- denies eye drainage, change in vision, nasal discharge, CVS- denies chest pain, palpitations RESP- denies SOB, cough, wheeze ABD- denies N/V, change in stools, abd pain GU- denies dysuria, hematuria, dribbling, incontinence MSK- denies joint pain, muscle aches, injury Neuro- denies headache, dizziness, syncope, seizure activity       Objective:    BP 138/84   Pulse 98   Temp 97.8 F (36.6 C) (Temporal)   Resp 14   Ht 5\' 3"  (1.6 m)   Wt 197 lb (89.4 kg)   SpO2 94%   BMI 34.90 kg/m  GEN- NAD, alert and oriented x3 HEENT- PERRL, EOMI, non injected sclera, pink conjunctiva, MMM, oropharynx clear Neck- Supple, no thyromegaly CVS- RRR, no murmur RESP-CTAB ABD-NABS,soft,NT,ND EXT- No edema Pulses- Radial, DP- 2+        Assessment & Plan:      Problem List Items Addressed This Visit      Unprioritized   CAD (coronary artery disease) - Primary    No current symptoms of unstable angina.  Blood pressure looks okay today no changes to medication.  She is on statin drug. She has been tobacco free since October      Relevant Orders   CBC with Differential/Platelet   Basic metabolic panel   DDD (degenerative disc disease), lumbar    Chronic back pain knee pain she is on oxycodone acetaminophen for chronic pain.      Diabetic neuropathy (HCC)   Essential hypertension, benign   Relevant Orders   CBC with Differential/Platelet   Basic metabolic panel   Hyperlipidemia   Type 2 diabetes mellitus with diabetic neuropathy (HCC)    Recheck A1c though it is difficult to manage her blood sugars because they fluctuate so much despite her A1c being normal.  Not had any hypoglycemia symptoms      Relevant Orders   Hemoglobin A1c      Note: This dictation was prepared with Dragon dictation along with smaller phrase technology. Any transcriptional errors that result from this process are unintentional.

## 2019-11-11 LAB — CBC WITH DIFFERENTIAL/PLATELET
Absolute Monocytes: 511 cells/uL (ref 200–950)
Basophils Absolute: 72 cells/uL (ref 0–200)
Basophils Relative: 1 %
Eosinophils Absolute: 418 cells/uL (ref 15–500)
Eosinophils Relative: 5.8 %
HCT: 28.8 % — ABNORMAL LOW (ref 35.0–45.0)
Hemoglobin: 9.2 g/dL — ABNORMAL LOW (ref 11.7–15.5)
Lymphs Abs: 2419 cells/uL (ref 850–3900)
MCH: 25.6 pg — ABNORMAL LOW (ref 27.0–33.0)
MCHC: 31.9 g/dL — ABNORMAL LOW (ref 32.0–36.0)
MCV: 80.2 fL (ref 80.0–100.0)
MPV: 9.9 fL (ref 7.5–12.5)
Monocytes Relative: 7.1 %
Neutro Abs: 3780 cells/uL (ref 1500–7800)
Neutrophils Relative %: 52.5 %
Platelets: 439 10*3/uL — ABNORMAL HIGH (ref 140–400)
RBC: 3.59 10*6/uL — ABNORMAL LOW (ref 3.80–5.10)
RDW: 17.6 % — ABNORMAL HIGH (ref 11.0–15.0)
Total Lymphocyte: 33.6 %
WBC: 7.2 10*3/uL (ref 3.8–10.8)

## 2019-11-11 LAB — HEMOGLOBIN A1C
Hgb A1c MFr Bld: 5.7 % of total Hgb — ABNORMAL HIGH (ref <5.7)
Mean Plasma Glucose: 117 (calc)
eAG (mmol/L): 6.5 (calc)

## 2019-11-11 LAB — BASIC METABOLIC PANEL
BUN: 19 mg/dL (ref 7–25)
CO2: 21 mmol/L (ref 20–32)
Calcium: 9.1 mg/dL (ref 8.6–10.4)
Chloride: 106 mmol/L (ref 98–110)
Creat: 0.86 mg/dL (ref 0.50–0.99)
Glucose, Bld: 135 mg/dL — ABNORMAL HIGH (ref 65–99)
Potassium: 4.8 mmol/L (ref 3.5–5.3)
Sodium: 139 mmol/L (ref 135–146)

## 2019-11-13 ENCOUNTER — Other Ambulatory Visit: Payer: Self-pay | Admitting: *Deleted

## 2019-11-13 DIAGNOSIS — D649 Anemia, unspecified: Secondary | ICD-10-CM

## 2019-11-13 MED ORDER — INSULIN GLARGINE 100 UNIT/ML ~~LOC~~ SOLN: 10.0000 [IU] | Freq: Every day | SUBCUTANEOUS | 0 refills | Status: DC

## 2019-11-19 DIAGNOSIS — Z7984 Long term (current) use of oral hypoglycemic drugs: Secondary | ICD-10-CM | POA: Diagnosis not present

## 2019-11-19 DIAGNOSIS — Z794 Long term (current) use of insulin: Secondary | ICD-10-CM | POA: Diagnosis not present

## 2019-11-19 DIAGNOSIS — E119 Type 2 diabetes mellitus without complications: Secondary | ICD-10-CM | POA: Diagnosis not present

## 2019-11-19 DIAGNOSIS — H2513 Age-related nuclear cataract, bilateral: Secondary | ICD-10-CM | POA: Diagnosis not present

## 2019-11-19 LAB — HM DIABETES EYE EXAM

## 2019-11-20 ENCOUNTER — Encounter: Payer: Self-pay | Admitting: *Deleted

## 2019-11-20 DIAGNOSIS — N321 Vesicointestinal fistula: Secondary | ICD-10-CM | POA: Diagnosis not present

## 2019-11-20 DIAGNOSIS — T8189XD Other complications of procedures, not elsewhere classified, subsequent encounter: Secondary | ICD-10-CM | POA: Diagnosis not present

## 2019-11-20 DIAGNOSIS — R300 Dysuria: Secondary | ICD-10-CM | POA: Diagnosis not present

## 2019-11-25 ENCOUNTER — Other Ambulatory Visit: Payer: Self-pay | Admitting: Family Medicine

## 2019-11-25 MED ORDER — OXYCODONE-ACETAMINOPHEN 7.5-325 MG PO TABS: 1.0000 | ORAL_TABLET | Freq: Four times a day (QID) | ORAL | 0 refills | Status: DC | PRN

## 2019-11-25 NOTE — Telephone Encounter (Signed)
Last refilled: 10/27/2019 Last office visit: 11/10/2019

## 2019-11-27 ENCOUNTER — Other Ambulatory Visit: Payer: Self-pay | Admitting: Family Medicine

## 2019-12-02 DIAGNOSIS — M722 Plantar fascial fibromatosis: Secondary | ICD-10-CM | POA: Diagnosis not present

## 2019-12-02 DIAGNOSIS — M7731 Calcaneal spur, right foot: Secondary | ICD-10-CM | POA: Diagnosis not present

## 2019-12-10 ENCOUNTER — Telehealth: Payer: Self-pay | Admitting: Family Medicine

## 2019-12-10 NOTE — Telephone Encounter (Signed)
CB [phone] Pt take Lancets can she also take Levemir  with the Lancets

## 2019-12-10 NOTE — Telephone Encounter (Signed)
Clarification:  Patient has prescription of Lantus insulin, but die to cost, was given sample of Levemir insulin from PCP office.   Advised that both insulins are basal insulins, but actual medication should not be mixed.   Advised if there is not enough Lantus to complete dosage for today, use what Lantus she has in one side, and use the Levemir to complete dose on the other side. She can also switch to Levemir completely if she only wants to have (1) injection.

## 2019-12-17 ENCOUNTER — Other Ambulatory Visit: Payer: Self-pay | Admitting: *Deleted

## 2019-12-17 DIAGNOSIS — R69 Illness, unspecified: Secondary | ICD-10-CM | POA: Diagnosis not present

## 2019-12-17 MED ORDER — BLOOD GLUCOSE TEST VI STRP: ORAL_STRIP | 11 refills | Status: DC

## 2019-12-17 MED ORDER — BLOOD GLUCOSE SYSTEM PAK KIT: PACK | 1 refills | Status: DC

## 2019-12-17 MED ORDER — LANCETS MISC
11 refills | Status: DC
Start: 2019-12-17 — End: 2021-05-02

## 2019-12-18 DIAGNOSIS — N824 Other female intestinal-genital tract fistulae: Secondary | ICD-10-CM | POA: Diagnosis not present

## 2019-12-18 DIAGNOSIS — N321 Vesicointestinal fistula: Secondary | ICD-10-CM | POA: Diagnosis not present

## 2019-12-18 DIAGNOSIS — T8189XD Other complications of procedures, not elsewhere classified, subsequent encounter: Secondary | ICD-10-CM | POA: Diagnosis not present

## 2019-12-18 DIAGNOSIS — Z9889 Other specified postprocedural states: Secondary | ICD-10-CM | POA: Diagnosis not present

## 2019-12-22 ENCOUNTER — Other Ambulatory Visit: Payer: Self-pay | Admitting: *Deleted

## 2019-12-22 MED ORDER — OXYCODONE-ACETAMINOPHEN 7.5-325 MG PO TABS: 1.0000 | ORAL_TABLET | Freq: Four times a day (QID) | ORAL | 0 refills | Status: DC | PRN

## 2019-12-22 NOTE — Telephone Encounter (Signed)
Received call from patient.   Requested refill on Oxycodone/APAP.   Ok to refill??  Last office visit 11/10/2019.  Last refill 11/25/2019.

## 2019-12-29 DIAGNOSIS — M7731 Calcaneal spur, right foot: Secondary | ICD-10-CM | POA: Diagnosis not present

## 2019-12-29 DIAGNOSIS — M79671 Pain in right foot: Secondary | ICD-10-CM | POA: Diagnosis not present

## 2019-12-29 DIAGNOSIS — M722 Plantar fascial fibromatosis: Secondary | ICD-10-CM | POA: Diagnosis not present

## 2019-12-31 ENCOUNTER — Telehealth: Payer: Self-pay | Admitting: *Deleted

## 2019-12-31 NOTE — Telephone Encounter (Signed)
Received call from patient.   Reports that podiatrist started her on medrol dose pack. States that she is on 4 tbas PO today. Reports that she will have 3 additional days of prednisone.   Reports that FSBS have been elevated with prednisone. States that readings have been getting higher: 5/25 AM: 341 5/25 PM: 360 5/26 AM: 381  States that she is currently taking Metformin XR 1000mg  PO Q AM and basal insulin 10U SQ Q HS.  MD please advise.

## 2019-12-31 NOTE — Telephone Encounter (Signed)
Increase basal insulin to 12 units

## 2019-12-31 NOTE — Telephone Encounter (Signed)
Call placed to patient and patient made aware per VM.  

## 2019-12-31 NOTE — Telephone Encounter (Signed)
Patient returned call and made aware.

## 2020-01-06 ENCOUNTER — Telehealth: Payer: Self-pay | Admitting: *Deleted

## 2020-01-06 NOTE — Telephone Encounter (Signed)
Received call from patient.   Reports that her blood sugars are fluctuating.   Reports that readings are as follows:  AM Noon PM  27-May 381    28-May  377 374  29-May 144    30-May   276  31-May 214  306  1-Jun 277 301    Reports that she is taking Basal Insulin at 12U SQ QHS and Metformin XR 1000mg .  Per MD, increase basal insulin to 15U and schedule appointment for evaluation.   Call placed to patient and patient made aware.   Will call back to schedule appointment.

## 2020-01-11 ENCOUNTER — Other Ambulatory Visit: Payer: Self-pay | Admitting: Family Medicine

## 2020-01-12 ENCOUNTER — Ambulatory Visit: Payer: Medicare HMO | Admitting: Family Medicine

## 2020-01-13 ENCOUNTER — Other Ambulatory Visit: Payer: Self-pay | Admitting: Family Medicine

## 2020-01-13 DIAGNOSIS — R69 Illness, unspecified: Secondary | ICD-10-CM | POA: Diagnosis not present

## 2020-01-20 ENCOUNTER — Other Ambulatory Visit: Payer: Self-pay | Admitting: *Deleted

## 2020-01-20 MED ORDER — OXYCODONE-ACETAMINOPHEN 7.5-325 MG PO TABS: 1.0000 | ORAL_TABLET | Freq: Four times a day (QID) | ORAL | 0 refills | Status: DC | PRN

## 2020-01-20 NOTE — Telephone Encounter (Signed)
Received call from patient.   Requested refill on Oxycodone/APAP.  Ok to refill??  Last office visit 12/10/2019.  Last refill 12/22/2019.

## 2020-01-21 ENCOUNTER — Ambulatory Visit (INDEPENDENT_AMBULATORY_CARE_PROVIDER_SITE_OTHER): Payer: Medicare HMO | Admitting: Family Medicine

## 2020-01-21 ENCOUNTER — Encounter: Payer: Self-pay | Admitting: Family Medicine

## 2020-01-21 ENCOUNTER — Other Ambulatory Visit: Payer: Self-pay

## 2020-01-21 VITALS — BP 122/78 | HR 92 | Temp 98.0°F | Resp 14 | Ht 63.0 in | Wt 197.0 lb

## 2020-01-21 DIAGNOSIS — E1140 Type 2 diabetes mellitus with diabetic neuropathy, unspecified: Secondary | ICD-10-CM

## 2020-01-21 DIAGNOSIS — Z794 Long term (current) use of insulin: Secondary | ICD-10-CM

## 2020-01-21 NOTE — Assessment & Plan Note (Signed)
Per my previous notations her CBG are always out of proportion to her labs Contuinue lantus 15 units Metformin 1000mg  XR once a day  Consider adding SGLT-2 with her cardiovascular history She declines seeing endocrinology again  No sign of infection No dietary changes reported with increased sugars

## 2020-01-21 NOTE — Progress Notes (Addendum)
Subjective:    Patient ID: Stephanie Hammond, female    DOB: [birth year], 69 y.o.   MRN: [id number]  Patient presents for F/U D (reports that she is having increaed FSBS)   Pt here to f/U Diabetes  Her fasting 144-380's  , evening 250-350's   She had 1 blood sugar into the 500;s  she is taking MEtformon 1000mg  XR   I increased her Lantus to  15 units 2 weeks ago   This AM 227 CBG past week 148-274 She has not been on any recent prednisone  Her LAst A1C was  5.7%    No current antibiotics   COVID-19 Shot UTD   Fistula site is healing well,no pain    Review Of Systems:  GEN- denies fatigue, fever, weight loss,weakness, recent illness HEENT- denies eye drainage, change in vision, nasal discharge, CVS- denies chest pain, palpitations RESP- denies SOB, cough, wheeze ABD- denies N/V, change in stools, abd pain GU- denies dysuria, hematuria, dribbling, incontinence MSK- + joint pain, muscle aches, injury Neuro- denies headache, dizziness, syncope, seizure activity       Objective:    BP 122/78   Pulse 92   Temp 98 F (36.7 C) (Temporal)   Resp 14   Ht 5\' 3"  (1.6 m)   Wt 197 lb (89.4 kg)   SpO2 94%   BMI 34.90 kg/m  GEN- NAD, alert and oriented x3 HEENT- PERRL, EOMI, non injected sclera, pink conjunctiva, MMM, oropharynx clear Neck- Supple, no thyromegaly CVS- RRR, no murmur RESP-CTAB ABD-NABS,soft,NT,ND, incision healing well, small amount of liner granulation tissue in the incision  EXT- No edema Pulses- Radial, DP- 2+        Assessment & Plan:      Problem List Items Addressed This Visit      Unprioritized   Type 2 diabetes mellitus with diabetic neuropathy (HCC) - Primary    Per my previous notations her CBG are always out of proportion to her labs Contuinue lantus 15 units Metformin 1000mg  XR once a day  Consider adding SGLT-2 with her cardiovascular history She declines seeing endocrinology again  No sign of infection No dietary changes  reported with increased sugars      Relevant Medications   insulin glargine (LANTUS) 100 UNIT/ML injection   Other Relevant Orders   Basic metabolic panel   Hemoglobin A1c      Note: This dictation was prepared with Dragon dictation along with smaller phrase technology. Any transcriptional errors that result from this process are unintentional.

## 2020-01-21 NOTE — Patient Instructions (Signed)
F/U  3 months 

## 2020-01-22 LAB — HEMOGLOBIN A1C
Hgb A1c MFr Bld: 6 % of total Hgb — ABNORMAL HIGH (ref <5.7)
Mean Plasma Glucose: 126 (calc)
eAG (mmol/L): 7 (calc)

## 2020-01-22 LAB — BASIC METABOLIC PANEL
BUN: 22 mg/dL (ref 7–25)
CO2: 23 mmol/L (ref 20–32)
Calcium: 9.2 mg/dL (ref 8.6–10.4)
Chloride: 106 mmol/L (ref 98–110)
Creat: 0.87 mg/dL (ref 0.50–0.99)
Glucose, Bld: 334 mg/dL — ABNORMAL HIGH (ref 65–99)
Potassium: 5.2 mmol/L (ref 3.5–5.3)
Sodium: 139 mmol/L (ref 135–146)

## 2020-01-23 ENCOUNTER — Other Ambulatory Visit: Payer: Self-pay | Admitting: *Deleted

## 2020-01-23 DIAGNOSIS — E1140 Type 2 diabetes mellitus with diabetic neuropathy, unspecified: Secondary | ICD-10-CM

## 2020-01-26 ENCOUNTER — Other Ambulatory Visit: Payer: Self-pay | Admitting: *Deleted

## 2020-01-26 MED ORDER — BD PEN NEEDLE MINI U/F 31G X 5 MM MISC: 3 refills | Status: DC

## 2020-01-28 ENCOUNTER — Other Ambulatory Visit: Payer: Self-pay | Admitting: Family Medicine

## 2020-02-09 ENCOUNTER — Telehealth: Payer: Self-pay | Admitting: Family Medicine

## 2020-02-09 DIAGNOSIS — M5136 Other intervertebral disc degeneration, lumbar region: Secondary | ICD-10-CM

## 2020-02-09 DIAGNOSIS — M5410 Radiculopathy, site unspecified: Secondary | ICD-10-CM

## 2020-02-09 NOTE — Telephone Encounter (Signed)
Patient left vm requesting a referral to pain management.  CB# [phone]

## 2020-02-10 NOTE — Telephone Encounter (Signed)
Call placed to patient. LMTRC.  

## 2020-02-11 NOTE — Telephone Encounter (Signed)
Call placed to patient. LMTRC.  

## 2020-02-12 NOTE — Telephone Encounter (Signed)
Patient returned call.   Reports that she would like to be referred to Dr. Merlene Laughter for pain management. Reports that she discussed with PCP previously for DDD Lumbar, chronic pain management.   MD please advise.

## 2020-02-13 ENCOUNTER — Other Ambulatory Visit: Payer: Self-pay | Admitting: *Deleted

## 2020-02-13 NOTE — Telephone Encounter (Signed)
Received call from patient.   Requested refill on Oxycodone/APAP.  Ok to refill??  Last office visit 01/21/2020.  Last refill 01/20/2020.

## 2020-02-13 NOTE — Telephone Encounter (Signed)
Okay to place referral

## 2020-02-13 NOTE — Telephone Encounter (Signed)
Referral orders placed

## 2020-02-16 ENCOUNTER — Other Ambulatory Visit: Payer: Self-pay | Admitting: *Deleted

## 2020-02-16 MED ORDER — OXYCODONE-ACETAMINOPHEN 7.5-325 MG PO TABS: 1.0000 | ORAL_TABLET | Freq: Four times a day (QID) | ORAL | 0 refills | Status: DC | PRN

## 2020-02-16 NOTE — Telephone Encounter (Signed)
Received call from patient.   Requested refill on Oxycodone/APAP.  Ok to refill??  Last office visit 6/16//2021.  Last refill 01/20/2020.

## 2020-02-19 DIAGNOSIS — R69 Illness, unspecified: Secondary | ICD-10-CM | POA: Diagnosis not present

## 2020-03-04 ENCOUNTER — Telehealth: Payer: Self-pay | Admitting: *Deleted

## 2020-03-04 MED ORDER — BLOOD GLUCOSE SYSTEM PAK KIT: PACK | 1 refills | Status: DC

## 2020-03-04 MED ORDER — NOVOLOG FLEXPEN 100 UNIT/ML ~~LOC~~ SOPN: PEN_INJECTOR | SUBCUTANEOUS | 11 refills | Status: DC

## 2020-03-04 NOTE — Telephone Encounter (Signed)
Received call from patient.   Reports that she had cheerios, 2% milk, and a banana around 11am. Reports FSBS 484 at 2pm.  States that she has a slight HA, but no other Sx.   Repeat FSBS noted at 346.   MD made aware and new orders obtained: Send new meter to pharmacy as readings have too much fluctuation.  Monitor FSBS QID.  Novolog SSI:151-200 2U, 201-250 4U, 251- 300 6U, 301- 350 8U, 351- 400+ 10U  Continue Lantus 15U Q HS and Metformin 1000mg  PO Q AM.   Follow up with PCP next week.   Patient made aware and appointment scheduled for Tuesday.

## 2020-03-05 NOTE — Telephone Encounter (Signed)
Noted  

## 2020-03-09 ENCOUNTER — Ambulatory Visit: Payer: Self-pay | Admitting: Family Medicine

## 2020-03-15 ENCOUNTER — Other Ambulatory Visit: Payer: Self-pay | Admitting: *Deleted

## 2020-03-15 ENCOUNTER — Other Ambulatory Visit: Payer: Self-pay

## 2020-03-15 ENCOUNTER — Encounter: Payer: Self-pay | Admitting: "Endocrinology

## 2020-03-15 ENCOUNTER — Ambulatory Visit (INDEPENDENT_AMBULATORY_CARE_PROVIDER_SITE_OTHER): Payer: Medicare HMO | Admitting: "Endocrinology

## 2020-03-15 VITALS — BP 134/70 | HR 108 | Ht 63.0 in | Wt 203.0 lb

## 2020-03-15 DIAGNOSIS — E1140 Type 2 diabetes mellitus with diabetic neuropathy, unspecified: Secondary | ICD-10-CM | POA: Diagnosis not present

## 2020-03-15 DIAGNOSIS — Z794 Long term (current) use of insulin: Secondary | ICD-10-CM

## 2020-03-15 MED ORDER — NOVOLOG FLEXPEN 100 UNIT/ML ~~LOC~~ SOPN: 5.0000 [IU] | PEN_INJECTOR | Freq: Three times a day (TID) | SUBCUTANEOUS | 1 refills | Status: DC

## 2020-03-15 MED ORDER — OXYCODONE-ACETAMINOPHEN 7.5-325 MG PO TABS: 1.0000 | ORAL_TABLET | Freq: Four times a day (QID) | ORAL | 0 refills | Status: DC | PRN

## 2020-03-15 MED ORDER — LEVEMIR FLEXTOUCH 100 UNIT/ML ~~LOC~~ SOPN: 20.0000 [IU] | PEN_INJECTOR | Freq: Every day | SUBCUTANEOUS | 1 refills | Status: DC

## 2020-03-15 NOTE — Patient Instructions (Signed)

## 2020-03-15 NOTE — Telephone Encounter (Signed)
Received call from patient.   Requested refill on Oxycodone/APAP.   Ok to refill??  Last office visit 02/09/2020.  Last refill 02/16/2020.

## 2020-03-15 NOTE — Progress Notes (Signed)
Endocrinology Consult Note       03/15/2020, 5:19 PM   Subjective:    Patient ID: Stephanie Hammond, female    DOB: 1950/10/22.  Stephanie Hammond is being seen in consultation for management of currently uncontrolled symptomatic diabetes requested by  Salley Scarlet, MD.   Past Medical History:  Diagnosis Date  . Anemia   . Blood transfusion 2009   after GI bleed  . CAD (coronary artery disease)   . Diabetes mellitus    2  . Diverticulitis   . Diverticulosis   . Fear of local anesthetic    01-02-2019, patient states with last surgery in 2016 , she refused to have epidural before she was sedated d/t to extreme aversion to needles near her back    . GERD (gastroesophageal reflux disease)   . Gout   . Headache    occ if sugar is  too hi or low   . Heart murmur    "i was told this years and years and years ago "  . History of GI diverticular bleed 08/2004  . Hypertension   . IBS (irritable bowel syndrome)   . Osteoarthritis   . Tachycardia    "it stays like that for years between 119 and 121 and it doesnt bother me  none "     Past Surgical History:  Procedure Laterality Date  . CHOLECYSTECTOMY  2003  . COLONOSCOPY  2000   Dr. Russella Dar: marked diverticulosis, difficult procedure due to adhesions, polyps benign  . COLONOSCOPY  2006   Dr. Marina Goodell: severe pandiverticulosis  . COLONOSCOPY N/A 02/03/2013   SLF: 4 COLORECTAL POLYPS REMOVED/Moderate diverticulosis throughout the entire examined colon/Small internal hemorrhoids  . COLONOSCOPY WITH PROPOFOL N/A 04/29/2019   Procedure: COLONOSCOPY WITH PROPOFOL;  Surgeon: West Bali, MD;  Location: [facility];  Service: Endoscopy;  Laterality: N/A;  8:30am  . JOINT REPLACEMENT Right 2009   Knee Replacement Right   . POLYPECTOMY  04/29/2019   Procedure: POLYPECTOMY;  Surgeon: West Bali, MD;  Location: [facility];  Service: Endoscopy;;  colon   . PTCA     over 10 years ago , unsure of exact year , does recall that no stents were placed   . REPLACEMENT TOTAL KNEE     right   . TOTAL KNEE ARTHROPLASTY Left 03/12/2015   Procedure: LEFT TOTAL KNEE REPLACEMENT;  Surgeon: Vickki Hearing, MD;  Location: [facility];  Service: Orthopedics;  Laterality: Left;  . TOTAL KNEE REVISION Left 01/09/2019   Procedure: TOTAL KNEE REVISION;  Surgeon: Samson Frederic, MD;  Location: [facility];  Service: Orthopedics;  Laterality: Left;  [name] VAGINAL HYSTERECTOMY      Social History   Socioeconomic History  . Marital status: Married    Spouse name: Not on file  . Number of children: 2  . Years of education: Not on file  . Highest education level: Not on file  Occupational History  . Not on file  Tobacco Use  . Smoking status: Current Every Day Smoker    Packs/day: 1.00    Years: 35.00  Pack years: 35.00    Types: Cigarettes  . Smokeless tobacco: Never Used  Vaping Use  . Vaping Use: Never used  Substance and Sexual Activity  . Alcohol use: No  . Drug use: No  . Sexual activity: Not Currently    Birth control/protection: Surgical    Comment: hyst  Other Topics Concern  . Not on file  Social History Narrative  . Not on file   Social Determinants of Health   Financial Resource Strain:   . Difficulty of Paying Living Expenses:   Food Insecurity:   . Worried About Programme researcher, broadcasting/film/video in the Last Year:   . Barista in the Last Year:   Transportation Needs:   . Freight forwarder (Medical):   [name] Lack of Transportation (Non-Medical):   Physical Activity:   . Days of Exercise per Week:   . Minutes of Exercise per Session:   Stress:   . Feeling of Stress :   Social Connections:   . Frequency of Communication with Friends and Family:   . Frequency of Social Gatherings with Friends and Family:   . Attends Religious Services:   . Active Member of Clubs or Organizations:   . Attends Banker Meetings:   [name] Marital  Status:     Family History  Problem Relation Age of Onset  . Colon polyps Sister   . Cancer Mother        lung   . Diabetes Mother   . Cancer Father        stomach   . Stomach cancer Father        questionable  . Colon cancer Neg Hx     Outpatient Encounter Medications as of 03/15/2020  Medication Sig  . insulin detemir (LEVEMIR FLEXTOUCH) 100 UNIT/ML FlexPen Inject 20 Units into the skin at bedtime.  . [DISCONTINUED] insulin detemir (LEVEMIR FLEXTOUCH) 100 UNIT/ML FlexPen Inject 20 Units into the skin at bedtime.  [name] allopurinol (ZYLOPRIM) 300 MG tablet TAKE 1 TABLET BY MOUTH EVERY DAY  . amLODipine (NORVASC) 10 MG tablet TAKE 1 TABLET BY MOUTH EVERY DAY  . atorvastatin (LIPITOR) 20 MG tablet TAKE 1 TABLET BY MOUTH EVERY DAY  . Blood Glucose Monitoring Suppl (BLOOD GLUCOSE SYSTEM PAK) KIT Please dispense based on patient and insurance preference. Use as directed to monitor FSBS 3x daily. Dx: E11.9  . Glucose Blood (BLOOD GLUCOSE TEST STRIPS) STRP Please dispense based on patient and insurance preference. Use as directed to monitor FSBS 3x daily. Dx: E11.9  . insulin aspart (NOVOLOG FLEXPEN) 100 UNIT/ML FlexPen Inject 5-11 Units into the skin 3 (three) times daily with meals. Inject SQ QID SSI: 151-200 2U, 201-250 4U, 251- 300 6U, 301- 350 8U, 351- 400+ 10U  . Insulin Pen Needle (B-D UF III MINI PEN NEEDLES) 31G X 5 MM MISC INJECT 1 EACH INTO THE SKIN DAILY. USE AS DIRECTED TO INJECT INSULIN DAILY.  [name] Lancets MISC Please dispense based on patient and insurance preference. Use as directed to monitor FSBS 3x daily. Dx: E11.9  . losartan (COZAAR) 50 MG tablet TAKE 2 TABLETS BY MOUTH EVERY DAY  . metFORMIN (GLUCOPHAGE-XR) 500 MG 24 hr tablet TAKE 2 TABLETS(1000 MG) BY MOUTH DAILY WITH BREAKFAST  . naproxen (NAPROSYN) 500 MG tablet Take 500 mg by mouth 2 (two) times daily.  [name] omeprazole (PRILOSEC) 20 MG capsule TAKE 1 CAPSULE BY MOUTH EVERY DAY IN THE MORNING  . [DISCONTINUED] insulin aspart  (NOVOLOG FLEXPEN) 100  UNIT/ML FlexPen Inject SQ QID SSI: 151-200 2U, 201-250 4U, 251- 300 6U, 301- 350 8U, 351- 400+ 10U  . [DISCONTINUED] insulin glargine (LANTUS) 100 UNIT/ML injection Inject 15 Units into the skin daily.  . [DISCONTINUED] oxyCODONE-acetaminophen (PERCOCET) 7.5-325 MG tablet Take 1 tablet by mouth every 6 (six) hours as needed for severe pain.   No facility-administered encounter medications on file as of 03/15/2020.    ALLERGIES: Allergies  Allergen Reactions  . Ace Inhibitors Cough  . Bentyl [Dicyclomine Hcl] Other (See Comments)    Blurry vision  . Penicillins Itching and Swelling    Whelps Did it involve swelling of the face/tongue/throat, SOB, or low BP? No Did it involve sudden or severe rash/hives, skin peeling, or any reaction on the inside of your mouth or nose? No Did you need to seek medical attention at a hospital or doctor's office? No When did it last happen?20 years If all above answers are "NO", may proceed with cephalosporin use.     VACCINATION STATUS: Immunization History  Administered Date(s) Administered  . Moderna SARS-COVID-2 Vaccination 10/01/2019, 10/29/2019  . PPD Test 09/03/2017  . Zoster Recombinat (Shingrix) 11/07/2017, 11/07/2017    Diabetes She presents for her initial diabetic visit. She has type 2 diabetes mellitus. Onset time: She was diagnosed at approximate age of 89 years. Her disease course has been worsening. There are no hypoglycemic associated symptoms. Pertinent negatives for hypoglycemia include no confusion, headaches, pallor or seizures. Associated symptoms include fatigue. Pertinent negatives for diabetes include no chest pain, no polydipsia, no polyphagia and no polyuria. There are no hypoglycemic complications. Symptoms are improving. There are no diabetic complications. Risk factors for coronary artery disease include diabetes mellitus, dyslipidemia, hypertension, family history, sedentary lifestyle,  post-menopausal and tobacco exposure. Current diabetic treatment includes insulin injections and oral agent (monotherapy) (She is currently on Levemir 15 units nightly, NovoLog on a sliding scale.  She is also on Metformin 1000 mg daily at breakfast.). Her weight is increasing steadily. She is following a generally unhealthy diet. When asked about meal planning, she reported none. She has not had a previous visit with a dietitian. Her home blood glucose trend is decreasing steadily. (She presents with a meter showing average blood glucose above 200 mg per DL for the last 30 days. Her recent A1c was 6%.  Of note, she was recently diagnosed with anemia with a hemoglobin level of 9.2.  ) An ACE inhibitor/angiotensin II receptor blocker is being taken. Eye exam is current.  Hyperlipidemia This is a chronic problem. The current episode started more than 1 year ago. The problem is controlled. Exacerbating diseases include diabetes and obesity. Pertinent negatives include no chest pain, myalgias or shortness of breath. Current antihyperlipidemic treatment includes statins. Risk factors for coronary artery disease include dyslipidemia, diabetes mellitus, hypertension, a sedentary lifestyle, post-menopausal, obesity and family history.  Hypertension This is a chronic problem. Pertinent negatives include no chest pain, headaches, palpitations or shortness of breath. Risk factors for coronary artery disease include dyslipidemia, diabetes mellitus, obesity, sedentary lifestyle, smoking/tobacco exposure, family history and post-menopausal state. Past treatments include angiotensin blockers.     Review of Systems  Constitutional: Positive for fatigue. Negative for chills, fever and unexpected weight change.  HENT: Negative for trouble swallowing and voice change.   Eyes: Negative for visual disturbance.  Respiratory: Negative for cough, shortness of breath and wheezing.   Cardiovascular: Negative for chest pain,  palpitations and leg swelling.  Gastrointestinal: Negative for diarrhea, nausea and vomiting.  Endocrine: Negative for cold intolerance, heat intolerance, polydipsia, polyphagia and polyuria.  Musculoskeletal: Negative for arthralgias and myalgias.  Skin: Negative for color change, pallor, rash and wound.  Neurological: Negative for seizures and headaches.  Psychiatric/Behavioral: Negative for confusion and suicidal ideas.    Objective:    Vitals with BMI 03/15/2020 01/21/2020 11/10/2019  Height 5\' 3"  5\' 3"  5\' 3"   Weight 203 lbs 197 lbs 197 lbs  BMI 35.97 34.91 34.91  Systolic 134 122 102  Diastolic 70 78 84  Pulse 108 92 98    BP 134/70   Pulse (!) 108   Ht 5\' 3"  (1.6 m)   Wt 203 lb (92.1 kg)   BMI 35.96 kg/m   Wt Readings from Last 3 Encounters:  03/15/20 203 lb (92.1 kg)  01/21/20 197 lb (89.4 kg)  11/10/19 197 lb (89.4 kg)     Physical Exam Constitutional:      Appearance: She is well-developed.  HENT:     Head: Normocephalic and atraumatic.  Neck:     Thyroid: No thyromegaly.     Trachea: No tracheal deviation.  Cardiovascular:     Rate and Rhythm: Normal rate and regular rhythm.  Pulmonary:     Effort: Pulmonary effort is normal.  Abdominal:     Tenderness: There is no abdominal tenderness. There is no guarding.  Musculoskeletal:        General: Normal range of motion.     Cervical back: Normal range of motion and neck supple.     Comments: Her foot exam is normal.  Skin:    General: Skin is warm and dry.     Coloration: Skin is not pale.     Findings: No erythema or rash.  Neurological:     Mental Status: She is alert and oriented to person, place, and time.     Cranial Nerves: No cranial nerve deficit.     Coordination: Coordination normal.     Deep Tendon Reflexes: Reflexes are normal and symmetric.  Psychiatric:        Judgment: Judgment normal.       CMP ( most recent) CMP     Component Value Date/Time   NA 139 01/21/2020 1205   NA 140  12/03/2018 1205   K 5.2 01/21/2020 1205   CL 106 01/21/2020 1205   CO2 23 01/21/2020 1205   GLUCOSE 334 (H) 01/21/2020 1205   GLUCOSE 349 (H) 10/06/2015 1436   BUN 22 01/21/2020 1205   BUN 20 12/03/2018 1205   CREATININE 0.87 01/21/2020 1205   CALCIUM 9.2 01/21/2020 1205   PROT 7.2 05/13/2019 1434   PROT 7.0 12/03/2018 1205   ALBUMIN 4.2 12/03/2018 1205   AST 15 05/13/2019 1434   ALT 14 05/13/2019 1434   ALKPHOS 131 (H) 12/03/2018 1205   BILITOT 0.3 05/13/2019 1434   BILITOT 0.2 12/03/2018 1205   GFRNONAA >60 04/25/2019 1032   GFRNONAA 93 06/14/2017 0814   GFRAA >60 04/25/2019 1032   GFRAA 108 06/14/2017 0814     Diabetic Labs (most recent): Lab Results  Component Value Date   HGBA1C 6.0 (H) 01/21/2020   HGBA1C 5.7 (H) 11/10/2019   HGBA1C 5.2 05/13/2019     Lipid Panel ( most recent) Lipid Panel     Component Value Date/Time   CHOL 102 05/13/2019 1434   TRIG 127 05/13/2019 1434   HDL 41 (L) 05/13/2019 1434   CHOLHDL 2.5 05/13/2019 1434   VLDL 27 09/11/2016 1255   LDLCALC 40  05/13/2019 1434   LDLDIRECT 68 12/01/2013 1454      Lab Results  Component Value Date   TSH 0.75 09/10/2017   TSH 1.384 05/27/2012      Assessment & Plan:   1. Type 2 diabetes mellitus with diabetic neuropathy, with long-term current use of insulin (HCC)  - Stephanie Hammond has currently uncontrolled symptomatic type 2 DM since  69 years of age,  with most recent A1c of 6 %.  (Anemia with hemoglobin of 9.2) recent labs reviewed. -This patient was seen in this clinic in 2017 for diabetes care, did not return for follow-up visit. - I had a long discussion with her about the progressive nature of diabetes and the pathology behind its complications. -her diabetes is complicated by obesity/sedentary/and she remains at a high risk for more acute and chronic complications which include CAD, CVA, CKD, retinopathy, and neuropathy. These are all discussed in detail with her.  - I have  counseled her on diet  and weight management  by adopting a carbohydrate restricted/protein rich diet. Patient is encouraged to switch to  unprocessed or minimally processed     complex starch and increased protein intake (animal or plant source), fruits, and vegetables. -  she is advised to stick to a routine mealtimes to eat 3 meals  a day and avoid unnecessary snacks ( to snack only to correct hypoglycemia).   - she admits that there is a room for improvement in her food and drink choices. - Suggestion is made for her to avoid simple carbohydrates  from her diet including Cakes, Sweet Desserts, Ice Cream, Soda (diet and regular), Sweet Tea, Candies, Chips, Cookies, Store Bought Juices, Alcohol in Excess of  1-2 drinks a day, Artificial Sweeteners,  Coffee Creamer, and "Sugar-free" Products. This will help patient to have more stable blood glucose profile and potentially avoid unintended weight gain.  - she will be scheduled with Norm Salt, RDN, CDE for diabetes education.  - I have approached her with the following individualized plan to manage  her diabetes and patient agrees:   - she will be kept on her current basal/bolus insulin regimen in order for her to maintain control of diabetes to target.   -She is advised to increase her Levemir to 20 units nightly, adjust her NovoLog to units 3 times a day with meals  for pre-meal BG readings of 90-150mg /dl, plus patient specific correction dose for unexpected hyperglycemia above 150mg /dl, associated with strict monitoring of glucose 4 times a day-before meals and at bedtime. - she is warned not to take insulin without proper monitoring per orders. - Adjustment parameters are given to her for hypo and hyperglycemia in writing. - she is encouraged to call clinic for blood glucose levels less than 70 or above 300 mg /dl. - she is advised to continue Metformin 1000 mg p.o. daily at breakfast, therapeutically suitable for patient .  - she will be  considered for incretin therapy as appropriate next visit.  - Specific targets for  A1c;  LDL, HDL,  and Triglycerides were discussed with the patient.  2) Blood Pressure /Hypertension:  her blood pressure is  controlled to target.   she is advised to continue her current medications including losartan 50 mg p.o. daily with breakfast . 3) Lipids/Hyperlipidemia:   Review of her recent lipid panel showed  controlled  LDL at 68 .  she  is advised to continue    atorvastatin 20 mg daily at bedtime.  Side effects  and precautions discussed with her.  4)  Weight/Diet:  Body mass index is 35.96 kg/m.  -   clearly complicating her diabetes care.   she is  a candidate for weight loss. I discussed with her the fact that loss of 5 - 10% of her  current body weight will have the most impact on her diabetes management.  Exercise, and detailed carbohydrates information provided  -  detailed on discharge instructions.  5) Chronic Care/Health Maintenance:  -she  is on ACEI/ARB and Statin medications and  is encouraged to initiate and continue to follow up with Ophthalmology, Dentist,  Podiatrist at least yearly or according to recommendations, and advised to   stay away from smoking. I have recommended yearly flu vaccine and pneumonia vaccine at least every 5 years; moderate intensity exercise for up to 150 minutes weekly; and  sleep for at least 7 hours a day.  - she is  advised to maintain close follow up with [facility], Velna Hatchet, MD for primary care needs, as well as her other providers for optimal and coordinated care.   - Time spent in this patient care: 60 min, of which > 50% was spent in  counseling  her about her her currently uncontrolled type 2 diabetes, hyperlipidemia, hypertension and the rest reviewing her blood glucose logs , discussing her hypoglycemia and hyperglycemia episodes, reviewing her current and  previous labs / studies  ( including abstraction from other facilities) and medications  doses and  developing a  long term treatment plan based on the latest standards of care/ guidelines; and documenting her care.    Please refer to Patient Instructions for Blood Glucose Monitoring and Insulin/Medications Dosing Guide"  in media tab for additional information. Please  also refer to " Patient Self Inventory" in the Media  tab for reviewed elements of pertinent patient history.  Stephanie Hammond participated in the discussions, expressed understanding, and voiced agreement with the above plans.  All questions were answered to her satisfaction. she is encouraged to contact clinic should she have any questions or concerns prior to her return visit.   Follow up plan: - Return in about 10 days (around 03/25/2020) for F/U with Meter and Logs Only - no Labs.  Marquis Lunch, MD [facility] [address] [place], [place] [address] Phone: [phone]  Fax: [phone]    03/15/2020, 5:19 PM  This note was partially dictated with voice recognition software. Similar sounding words can be transcribed inadequately or may not  be corrected upon review.

## 2020-03-19 ENCOUNTER — Other Ambulatory Visit: Payer: Self-pay

## 2020-03-19 ENCOUNTER — Ambulatory Visit (INDEPENDENT_AMBULATORY_CARE_PROVIDER_SITE_OTHER): Payer: Medicare HMO | Admitting: Family Medicine

## 2020-03-19 ENCOUNTER — Encounter: Payer: Self-pay | Admitting: Family Medicine

## 2020-03-19 VITALS — BP 112/58 | HR 112 | Temp 97.6°F | Resp 18 | Wt 202.8 lb

## 2020-03-19 DIAGNOSIS — M1000 Idiopathic gout, unspecified site: Secondary | ICD-10-CM | POA: Diagnosis not present

## 2020-03-19 DIAGNOSIS — E785 Hyperlipidemia, unspecified: Secondary | ICD-10-CM

## 2020-03-19 DIAGNOSIS — I10 Essential (primary) hypertension: Secondary | ICD-10-CM | POA: Diagnosis not present

## 2020-03-19 DIAGNOSIS — I2510 Atherosclerotic heart disease of native coronary artery without angina pectoris: Secondary | ICD-10-CM

## 2020-03-19 DIAGNOSIS — D649 Anemia, unspecified: Secondary | ICD-10-CM

## 2020-03-19 DIAGNOSIS — Z1231 Encounter for screening mammogram for malignant neoplasm of breast: Secondary | ICD-10-CM | POA: Diagnosis not present

## 2020-03-19 LAB — COMPREHENSIVE METABOLIC PANEL
AG Ratio: 1.4 (calc) (ref 1.0–2.5)
ALT: 10 U/L (ref 6–29)
AST: 12 U/L (ref 10–35)
Albumin: 3.9 g/dL (ref 3.6–5.1)
Alkaline phosphatase (APISO): 114 U/L (ref 37–153)
BUN: 23 mg/dL (ref 7–25)
CO2: 22 mmol/L (ref 20–32)
Calcium: 9.5 mg/dL (ref 8.6–10.4)
Chloride: 107 mmol/L (ref 98–110)
Creat: 0.87 mg/dL (ref 0.50–0.99)
Globulin: 2.8 g/dL (calc) (ref 1.9–3.7)
Glucose, Bld: 154 mg/dL — ABNORMAL HIGH (ref 65–99)
Potassium: 5.5 mmol/L — ABNORMAL HIGH (ref 3.5–5.3)
Sodium: 139 mmol/L (ref 135–146)
Total Bilirubin: 0.3 mg/dL (ref 0.2–1.2)
Total Protein: 6.7 g/dL (ref 6.1–8.1)

## 2020-03-19 LAB — LIPID PANEL
Cholesterol: 92 mg/dL
HDL: 37 mg/dL — ABNORMAL LOW
LDL Cholesterol (Calc): 38 mg/dL
Non-HDL Cholesterol (Calc): 55 mg/dL
Total CHOL/HDL Ratio: 2.5 (calc)
Triglycerides: 85 mg/dL

## 2020-03-19 LAB — CBC WITH DIFFERENTIAL/PLATELET
Absolute Monocytes: 518 cells/uL (ref 200–950)
Basophils Absolute: 77 cells/uL (ref 0–200)
Basophils Relative: 1.1 %
Eosinophils Absolute: 252 cells/uL (ref 15–500)
Eosinophils Relative: 3.6 %
HCT: 31.9 % — ABNORMAL LOW (ref 35.0–45.0)
Hemoglobin: 10.2 g/dL — ABNORMAL LOW (ref 11.7–15.5)
Lymphs Abs: 1939 cells/uL (ref 850–3900)
MCH: 27.3 pg (ref 27.0–33.0)
MCHC: 32 g/dL (ref 32.0–36.0)
MCV: 85.5 fL (ref 80.0–100.0)
MPV: 10.4 fL (ref 7.5–12.5)
Monocytes Relative: 7.4 %
Neutro Abs: 4214 cells/uL (ref 1500–7800)
Neutrophils Relative %: 60.2 %
Platelets: 344 10*3/uL (ref 140–400)
RBC: 3.73 10*6/uL — ABNORMAL LOW (ref 3.80–5.10)
RDW: 18.4 % — ABNORMAL HIGH (ref 11.0–15.0)
Total Lymphocyte: 27.7 %
WBC: 7 10*3/uL (ref 3.8–10.8)

## 2020-03-19 LAB — IRON,TIBC AND FERRITIN PANEL
%SAT: 8 % — ABNORMAL LOW (ref 16–45)
Ferritin: 6 ng/mL — ABNORMAL LOW (ref 16–288)
Iron: 33 ug/dL — ABNORMAL LOW (ref 45–160)
TIBC: 397 ug/dL (ref 250–450)

## 2020-03-19 NOTE — Assessment & Plan Note (Signed)
No flares with allopurinol on board

## 2020-03-19 NOTE — Assessment & Plan Note (Signed)
Blood pressure controlled  No changes to meds Check lipids, LFT

## 2020-03-19 NOTE — Patient Instructions (Addendum)
Schedule your mammogram  F/U 4 months for Physical

## 2020-03-19 NOTE — Progress Notes (Signed)
Subjective:    Patient ID: Stephanie Hammond, female    DOB: [birth year], 69 y.o.   MRN: [id number]  Patient presents for Follow-up (blood sugar)  Patient here to follow-up chronic medical problems.  She recently established with endocrinology due to her uncontrolled diabetes mellitus.  She is very fluctuating blood sugars.  She is currently on levemir 20 units and metformin, also on Novolog 5 units Metformin 1000MG , XR She has f/u in 1 week   Coronary artery disease/pretension she is taking blood pressure medicine as prescribed amlodipine/losrtan 50mg   she is also on Lipitor 10mg  at bedtime  Gout no recent flares but maintained on allopurinol  Chronic back pain, OA knees- taking percocet prn  She needs to reschedule her pain management appointment   Iron def anemia- due for recheck on Iron, CBC   she is not on iron tablet   Due for mammogram   Review Of Systems:  GEN- denies fatigue, fever, weight loss,weakness, recent illness HEENT- denies eye drainage, change in vision, nasal discharge, CVS- denies chest pain, palpitations RESP- denies SOB, cough, wheeze ABD- denies N/V, change in stools, abd pain GU- denies dysuria, hematuria, dribbling, incontinence MSK- + joint pain, muscle aches, injury Neuro- denies headache, dizziness, syncope, seizure activity       Objective:    BP (!) 112/58 (BP Location: Right Arm, Patient Position: Sitting, Cuff Size: Normal)   Pulse (!) 112   Temp 97.6 F (36.4 C) (Temporal)   Resp 18   Wt 202 lb 12.8 oz (92 kg)   SpO2 96%   BMI 35.92 kg/m  GEN- NAD, alert and oriented x3 HEENT- PERRL, EOMI, non injected sclera, pink conjunctiva, MMM, oropharynx clear Neck- Supple, no thyromegaly CVS- RRR, no murmur , HR 90  RESP-CTAB ABD-NABS,soft,NT,ND EXT- No edema Pulses- Radial, DP- 2+        Assessment & Plan:      Problem List Items Addressed This Visit      Unprioritized   Anemia    She has not been taking iron Had post  operative anemia and iron def Recheck levels today       Relevant Orders   Iron, TIBC and Ferritin Panel   CAD (coronary artery disease) - Primary    Blood pressure controlled  No changes to meds Check lipids, LFT       Essential hypertension, benign   Relevant Orders   CBC with Differential/Platelet   Comprehensive metabolic panel   Gout    No flares with allopurinol on board       Hyperlipidemia   Relevant Orders   Lipid panel    Other Visit Diagnoses    Encounter for screening mammogram for malignant neoplasm of breast       Relevant Orders   MM 3D SCREEN BREAST BILATERAL      Note: This dictation was prepared with Dragon dictation along with smaller phrase technology. Any transcriptional errors that result from this process are unintentional.

## 2020-03-19 NOTE — Assessment & Plan Note (Signed)
She has not been taking iron Had post operative anemia and iron def Recheck levels today

## 2020-03-22 DIAGNOSIS — R69 Illness, unspecified: Secondary | ICD-10-CM | POA: Diagnosis not present

## 2020-03-23 ENCOUNTER — Ambulatory Visit (HOSPITAL_COMMUNITY): Payer: Medicare HMO

## 2020-03-23 ENCOUNTER — Telehealth: Payer: Self-pay

## 2020-03-23 ENCOUNTER — Telehealth: Payer: Self-pay | Admitting: "Endocrinology

## 2020-03-23 DIAGNOSIS — E1140 Type 2 diabetes mellitus with diabetic neuropathy, unspecified: Secondary | ICD-10-CM

## 2020-03-23 MED ORDER — BD PEN NEEDLE MINI U/F 31G X 5 MM MISC: 3 refills | Status: DC

## 2020-03-23 NOTE — Telephone Encounter (Signed)
Pt left a vm for a return call, she said she needs a new RX for her pen needles because he changed her at the last visit.

## 2020-03-23 NOTE — Telephone Encounter (Signed)
Error

## 2020-03-23 NOTE — Telephone Encounter (Signed)
Rx for pen needles to inject four times daily sent.

## 2020-03-24 ENCOUNTER — Other Ambulatory Visit: Payer: Self-pay | Admitting: *Deleted

## 2020-03-26 ENCOUNTER — Ambulatory Visit: Payer: Medicare HMO | Admitting: "Endocrinology

## 2020-03-29 ENCOUNTER — Ambulatory Visit (HOSPITAL_COMMUNITY): Payer: Medicare HMO

## 2020-04-13 ENCOUNTER — Other Ambulatory Visit: Payer: Self-pay | Admitting: *Deleted

## 2020-04-13 ENCOUNTER — Other Ambulatory Visit: Payer: Self-pay | Admitting: Family Medicine

## 2020-04-13 ENCOUNTER — Ambulatory Visit: Payer: Medicare HMO | Admitting: "Endocrinology

## 2020-04-13 MED ORDER — OXYCODONE-ACETAMINOPHEN 7.5-325 MG PO TABS: 1.0000 | ORAL_TABLET | Freq: Four times a day (QID) | ORAL | 0 refills | Status: DC | PRN

## 2020-04-13 NOTE — Telephone Encounter (Signed)
Received call from patient.   Requested refill on Oxycodone/APAP.   Ok to refill??  Last office visit 03/19/2020.  Last refill 03/15/2020.

## 2020-05-03 ENCOUNTER — Other Ambulatory Visit: Payer: Medicare HMO

## 2020-05-03 ENCOUNTER — Other Ambulatory Visit: Payer: Self-pay

## 2020-05-03 DIAGNOSIS — D649 Anemia, unspecified: Secondary | ICD-10-CM | POA: Diagnosis not present

## 2020-05-03 LAB — CBC WITH DIFFERENTIAL/PLATELET
Absolute Monocytes: 428 cells/uL (ref 200–950)
Basophils Absolute: 62 cells/uL (ref 0–200)
Basophils Relative: 1 %
Eosinophils Absolute: 149 cells/uL (ref 15–500)
Eosinophils Relative: 2.4 %
HCT: 31.4 % — ABNORMAL LOW (ref 35.0–45.0)
Hemoglobin: 10.2 g/dL — ABNORMAL LOW (ref 11.7–15.5)
Lymphs Abs: 1804 cells/uL (ref 850–3900)
MCH: 28.1 pg (ref 27.0–33.0)
MCHC: 32.5 g/dL (ref 32.0–36.0)
MCV: 86.5 fL (ref 80.0–100.0)
MPV: 10.1 fL (ref 7.5–12.5)
Monocytes Relative: 6.9 %
Neutro Abs: 3757 cells/uL (ref 1500–7800)
Neutrophils Relative %: 60.6 %
Platelets: 373 10*3/uL (ref 140–400)
RBC: 3.63 10*6/uL — ABNORMAL LOW (ref 3.80–5.10)
RDW: 16.4 % — ABNORMAL HIGH (ref 11.0–15.0)
Total Lymphocyte: 29.1 %
WBC: 6.2 10*3/uL (ref 3.8–10.8)

## 2020-05-03 LAB — IRON,TIBC AND FERRITIN PANEL
%SAT: 6 % (calc) — ABNORMAL LOW (ref 16–45)
Ferritin: 6 ng/mL — ABNORMAL LOW (ref 16–288)
Iron: 23 ug/dL — ABNORMAL LOW (ref 45–160)
TIBC: 357 mcg/dL (calc) (ref 250–450)

## 2020-05-06 ENCOUNTER — Encounter: Payer: Self-pay | Admitting: "Endocrinology

## 2020-05-06 ENCOUNTER — Ambulatory Visit (INDEPENDENT_AMBULATORY_CARE_PROVIDER_SITE_OTHER): Payer: Medicare HMO | Admitting: "Endocrinology

## 2020-05-06 ENCOUNTER — Other Ambulatory Visit: Payer: Self-pay

## 2020-05-06 VITALS — BP 130/67 | HR 107 | Ht 63.0 in | Wt 200.0 lb

## 2020-05-06 DIAGNOSIS — E1140 Type 2 diabetes mellitus with diabetic neuropathy, unspecified: Secondary | ICD-10-CM | POA: Diagnosis not present

## 2020-05-06 DIAGNOSIS — Z794 Long term (current) use of insulin: Secondary | ICD-10-CM

## 2020-05-06 NOTE — Progress Notes (Signed)
05/06/2020, 9:21 PM   Endocrinology follow-up note  Subjective:    Patient ID: Stephanie Hammond, female    DOB: 1951-04-23.  Stephanie Hammond is being seen in follow-up after she was seen in consultation for management of currently uncontrolled symptomatic diabetes requested by  Salley Scarlet, MD.   Past Medical History:  Diagnosis Date  . Anemia   . Blood transfusion 2009   after GI bleed  . CAD (coronary artery disease)   . Diabetes mellitus    2  . Diverticulitis   . Diverticulosis   . Fear of local anesthetic    01-02-2019, patient states with last surgery in 2016 , she refused to have epidural before she was sedated d/t to extreme aversion to needles near her back    . GERD (gastroesophageal reflux disease)   . Gout   . Headache    occ if sugar is  too hi or low   . Heart murmur    "i was told this years and years and years ago "  . History of GI diverticular bleed 08/2004  . Hypertension   . IBS (irritable bowel syndrome)   . Osteoarthritis   . Tachycardia    "it stays like that for years between 119 and 121 and it doesnt bother me  none "     Past Surgical History:  Procedure Laterality Date  . CHOLECYSTECTOMY  2003  . COLONOSCOPY  2000   Dr. Russella Dar: marked diverticulosis, difficult procedure due to adhesions, polyps benign  . COLONOSCOPY  2006   Dr. Marina Goodell: severe pandiverticulosis  . COLONOSCOPY N/A 02/03/2013   SLF: 4 COLORECTAL POLYPS REMOVED/Moderate diverticulosis throughout the entire examined colon/Small internal hemorrhoids  . COLONOSCOPY WITH PROPOFOL N/A 04/29/2019   Procedure: COLONOSCOPY WITH PROPOFOL;  Surgeon: West Bali, MD;  Location: [facility];  Service: Endoscopy;  Laterality: N/A;  8:30am  . JOINT REPLACEMENT Right 2009   Knee Replacement Right   . POLYPECTOMY  04/29/2019   Procedure: POLYPECTOMY;  Surgeon: West Bali, MD;  Location: [facility];  Service: Endoscopy;;  colon  . PTCA     over 10 years ago , unsure of exact year , does recall that no stents were placed   . REPLACEMENT TOTAL KNEE     right   . TOTAL KNEE ARTHROPLASTY Left 03/12/2015   Procedure: LEFT TOTAL KNEE REPLACEMENT;  Surgeon: Vickki Hearing, MD;  Location: [facility];  Service: Orthopedics;  Laterality: Left;  . TOTAL KNEE REVISION Left 01/09/2019   Procedure: TOTAL KNEE REVISION;  Surgeon: Samson Frederic, MD;  Location: [facility];  Service: Orthopedics;  Laterality: Left;  [name] VAGINAL HYSTERECTOMY      Social History   Socioeconomic History  . Marital status: Married    Spouse name: Not on file  . Number of children: 2  . Years of education: Not on file  . Highest education level: Not on file  Occupational History  . Not on file  Tobacco Use  . Smoking status: Current Every Day Smoker    Packs/day: 1.00  Years: 35.00    Pack years: 35.00    Types: Cigarettes  . Smokeless tobacco: Never Used  Vaping Use  . Vaping Use: Never used  Substance and Sexual Activity  . Alcohol use: No  . Drug use: No  . Sexual activity: Not Currently    Birth control/protection: Surgical    Comment: hyst  Other Topics Concern  . Not on file  Social History Narrative  . Not on file   Social Determinants of Health   Financial Resource Strain:   . Difficulty of Paying Living Expenses: Not on file  Food Insecurity:   . Worried About Programme researcher, broadcasting/film/video in the Last Year: Not on file  . Ran Out of Food in the Last Year: Not on file  Transportation Needs:   . Lack of Transportation (Medical): Not on file  . Lack of Transportation (Non-Medical): Not on file  Physical Activity:   . Days of Exercise per Week: Not on file  . Minutes of Exercise per Session: Not on file  Stress:   . Feeling of Stress : Not on file  Social Connections:   . Frequency of Communication with Friends and Family: Not on file  . Frequency of Social Gatherings with Friends and Family: Not  on file  . Attends Religious Services: Not on file  . Active Member of Clubs or Organizations: Not on file  . Attends Banker Meetings: Not on file  . Marital Status: Not on file    Family History  Problem Relation Age of Onset  . Colon polyps Sister   . Cancer Mother        lung   . Diabetes Mother   . Cancer Father        stomach   . Stomach cancer Father        questionable  . Colon cancer Neg Hx     Outpatient Encounter Medications as of 05/06/2020  Medication Sig  . Insulin Glargine (BASAGLAR KWIKPEN [place]) Inject 25 Units into the skin at bedtime.  [name] allopurinol (ZYLOPRIM) 300 MG tablet TAKE 1 TABLET BY MOUTH EVERY DAY  . amLODipine (NORVASC) 10 MG tablet TAKE 1 TABLET BY MOUTH EVERY DAY  . atorvastatin (LIPITOR) 20 MG tablet TAKE 1 TABLET BY MOUTH EVERY DAY  . Blood Glucose Monitoring Suppl (BLOOD GLUCOSE SYSTEM PAK) KIT Please dispense based on patient and insurance preference. Use as directed to monitor FSBS 3x daily. Dx: E11.9  . Glucose Blood (BLOOD GLUCOSE TEST STRIPS) STRP Please dispense based on patient and insurance preference. Use as directed to monitor FSBS 3x daily. Dx: E11.9  . Insulin Pen Needle (B-D UF III MINI PEN NEEDLES) 31G X 5 MM MISC USE AS DIRECTED TO INJECT INSULIN FOUR TIMES DAILY  . Lancets MISC Please dispense based on patient and insurance preference. Use as directed to monitor FSBS 3x daily. Dx: E11.9  . losartan (COZAAR) 50 MG tablet TAKE 2 TABLETS BY MOUTH EVERY DAY  . metFORMIN (GLUCOPHAGE-XR) 500 MG 24 hr tablet TAKE 2 TABLETS(1000 MG) BY MOUTH DAILY WITH BREAKFAST  . naproxen (NAPROSYN) 500 MG tablet Take 500 mg by mouth 2 (two) times daily.  [name] omeprazole (PRILOSEC) 20 MG capsule TAKE 1 CAPSULE BY MOUTH EVERY DAY IN THE MORNING  . oxyCODONE-acetaminophen (PERCOCET) 7.5-325 MG tablet Take 1 tablet by mouth every 6 (six) hours as needed for severe pain.  . [DISCONTINUED] insulin aspart (NOVOLOG FLEXPEN) 100 UNIT/ML FlexPen Inject 5-11  Units into the skin 3 (  three) times daily with meals. Inject SQ QID SSI: 151-200 2U, 201-250 4U, 251- 300 6U, 301- 350 8U, 351- 400+ 10U  . [DISCONTINUED] insulin detemir (LEVEMIR FLEXTOUCH) 100 UNIT/ML FlexPen Inject 20 Units into the skin at bedtime.   No facility-administered encounter medications on file as of 05/06/2020.    ALLERGIES: Allergies  Allergen Reactions  . Ace Inhibitors Cough  . Bentyl [Dicyclomine Hcl] Other (See Comments)    Blurry vision  . Penicillins Itching and Swelling    [place] Did it involve swelling of the face/tongue/throat, SOB, or low BP? No Did it involve sudden or severe rash/hives, skin peeling, or any reaction on the inside of your mouth or nose? No Did you need to seek medical attention at a hospital or doctor's office? No When did it last happen?20 years If all above answers are "NO", may proceed with cephalosporin use.     VACCINATION STATUS: Immunization History  Administered Date(s) Administered  . Moderna SARS-COVID-2 Vaccination 10/01/2019, 10/29/2019  . PPD Test 09/03/2017  . Zoster Recombinat (Shingrix) 11/07/2017, 11/07/2017    Diabetes She presents for her follow-up diabetic visit. She has type 2 diabetes mellitus. Onset time: She was diagnosed at approximate age of 57 years. Her disease course has been improving. There are no hypoglycemic associated symptoms. Pertinent negatives for hypoglycemia include no confusion, headaches, pallor or seizures. Associated symptoms include fatigue. Pertinent negatives for diabetes include no chest pain, no polydipsia, no polyphagia and no polyuria. There are no hypoglycemic complications. Symptoms are improving. There are no diabetic complications. Risk factors for coronary artery disease include diabetes mellitus, dyslipidemia, hypertension, family history, sedentary lifestyle, post-menopausal and tobacco exposure. Current diabetic treatment includes insulin injections and oral agent (monotherapy)  (She is currently on Levemir 15 units nightly, NovoLog on a sliding scale.  She is also on Metformin 1000 mg daily at breakfast.). Her weight is increasing steadily. She is following a generally unhealthy diet. When asked about meal planning, she reported none. She has not had a previous visit with a dietitian. Her home blood glucose trend is decreasing steadily. Her breakfast blood glucose range is generally 130-140 mg/dl. Her overall blood glucose range is 130-140 mg/dl. (She presents with a meter showing average blood glucose of 126-150.  Her point-of-care A1c is 5.8%, improving overall.  She does not have hypoglycemia.  Of note, she was recently diagnosed with anemia with a hemoglobin level of 9.2.  ) An ACE inhibitor/angiotensin II receptor blocker is being taken. Eye exam is current.  Hyperlipidemia This is a chronic problem. The current episode started more than 1 year ago. The problem is controlled. Exacerbating diseases include diabetes and obesity. Pertinent negatives include no chest pain, myalgias or shortness of breath. Current antihyperlipidemic treatment includes statins. Risk factors for coronary artery disease include dyslipidemia, diabetes mellitus, hypertension, a sedentary lifestyle, post-menopausal, obesity and family history.  Hypertension This is a chronic problem. Pertinent negatives include no chest pain, headaches, palpitations or shortness of breath. Risk factors for coronary artery disease include dyslipidemia, diabetes mellitus, obesity, sedentary lifestyle, smoking/tobacco exposure, family history and post-menopausal state. Past treatments include angiotensin blockers.     Review of Systems  Constitutional: Positive for fatigue. Negative for chills, fever and unexpected weight change.  HENT: Negative for trouble swallowing and voice change.   Eyes: Negative for visual disturbance.  Respiratory: Negative for cough, shortness of breath and wheezing.   Cardiovascular: Negative  for chest pain, palpitations and leg swelling.  Gastrointestinal: Negative for diarrhea, nausea and vomiting.  Endocrine: Negative for cold intolerance, heat intolerance, polydipsia, polyphagia and polyuria.  Musculoskeletal: Negative for arthralgias and myalgias.  Skin: Negative for color change, pallor, rash and wound.  Neurological: Negative for seizures and headaches.  Psychiatric/Behavioral: Negative for confusion and suicidal ideas.    Objective:    Vitals with BMI 05/06/2020 03/19/2020 03/15/2020  Height 5\' 3"  - 5\' 3"   Weight 200 lbs 202 lbs 13 oz 203 lbs  BMI 35.44 35.93 35.97  Systolic 130 112 829  Diastolic 67 58 70  Pulse 107 112 108    BP 130/67   Pulse (!) 107   Ht 5\' 3"  (1.6 m)   Wt 200 lb (90.7 kg)   BMI 35.43 kg/m   Wt Readings from Last 3 Encounters:  05/06/20 200 lb (90.7 kg)  03/19/20 202 lb 12.8 oz (92 kg)  03/15/20 203 lb (92.1 kg)     Physical Exam Constitutional:      Appearance: She is well-developed.  HENT:     Head: Normocephalic and atraumatic.  Neck:     Thyroid: No thyromegaly.     Trachea: No tracheal deviation.  Cardiovascular:     Rate and Rhythm: Normal rate and regular rhythm.  Pulmonary:     Effort: Pulmonary effort is normal.  Abdominal:     Tenderness: There is no abdominal tenderness. There is no guarding.  Musculoskeletal:        General: Normal range of motion.     Cervical back: Normal range of motion and neck supple.     Comments: Her foot exam is normal.  Skin:    General: Skin is warm and dry.     Coloration: Skin is not pale.     Findings: No erythema or rash.  Neurological:     Mental Status: She is alert and oriented to person, place, and time.     Cranial Nerves: No cranial nerve deficit.     Coordination: Coordination normal.     Deep Tendon Reflexes: Reflexes are normal and symmetric.  Psychiatric:        Judgment: Judgment normal.    CMP ( most recent) CMP     Component Value Date/Time   NA 139  03/19/2020 1127   NA 140 12/03/2018 1205   K 5.5 (H) 03/19/2020 1127   CL 107 03/19/2020 1127   CO2 22 03/19/2020 1127   GLUCOSE 154 (H) 03/19/2020 1127   GLUCOSE 349 (H) 10/06/2015 1436   BUN 23 03/19/2020 1127   BUN 20 12/03/2018 1205   CREATININE 0.87 03/19/2020 1127   CALCIUM 9.5 03/19/2020 1127   PROT 6.7 03/19/2020 1127   PROT 7.0 12/03/2018 1205   ALBUMIN 4.2 12/03/2018 1205   AST 12 03/19/2020 1127   ALT 10 03/19/2020 1127   ALKPHOS 131 (H) 12/03/2018 1205   BILITOT 0.3 03/19/2020 1127   BILITOT 0.2 12/03/2018 1205   GFRNONAA >60 04/25/2019 1032   GFRNONAA 93 06/14/2017 0814   GFRAA >60 04/25/2019 1032   GFRAA 108 06/14/2017 0814     Diabetic Labs (most recent): Lab Results  Component Value Date   HGBA1C 6.0 (H) 01/21/2020   HGBA1C 5.7 (H) 11/10/2019   HGBA1C 5.2 05/13/2019     Lipid Panel ( most recent) Lipid Panel     Component Value Date/Time   CHOL 92 03/19/2020 1127   TRIG 85 03/19/2020 1127   HDL 37 (L) 03/19/2020 1127   CHOLHDL 2.5 03/19/2020 1127   VLDL 27 09/11/2016 1255   LDLCALC  38 03/19/2020 1127   LDLDIRECT 68 12/01/2013 1454      Lab Results  Component Value Date   TSH 0.75 09/10/2017   TSH 1.384 05/27/2012      Assessment & Plan:   1. Type 2 diabetes mellitus with diabetic neuropathy, with long-term current use of insulin (HCC)  - Stephanie Hammond has currently uncontrolled symptomatic type 2 DM since  69 years of age. She presents with a meter showing average blood glucose of 126-150.  Her point-of-care A1c is 5.8%, improving overall.  She does not have hypoglycemia.  Of note, she was recently diagnosed with anemia with a hemoglobin level of 9.2.    -This patient was seen in this clinic in 2017 for diabetes care, did not return for follow-up visit. - I had a long discussion with her about the progressive nature of diabetes and the pathology behind its complications. -her diabetes is complicated by obesity/sedentary/and she  remains at a high risk for more acute and chronic complications which include CAD, CVA, CKD, retinopathy, and neuropathy. These are all discussed in detail with her.  - I have counseled her on diet  and weight management  by adopting a carbohydrate restricted/protein rich diet. Patient is encouraged to switch to  unprocessed or minimally processed     complex starch and increased protein intake (animal or plant source), fruits, and vegetables. -  she is advised to stick to a routine mealtimes to eat 3 meals  a day and avoid unnecessary snacks ( to snack only to correct hypoglycemia).   - she  admits there is a room for improvement in her diet and drink choices. -  Suggestion is made for her to avoid simple carbohydrates  from her diet including Cakes, Sweet Desserts / Pastries, Ice Cream, Soda (diet and regular), Sweet Tea, Candies, Chips, Cookies, Sweet Pastries,  Store Bought Juices, Alcohol in Excess of  1-2 drinks a day, Artificial Sweeteners, Coffee Creamer, and "Sugar-free" Products. This will help patient to have stable blood glucose profile and potentially avoid unintended weight gain.   - she will be scheduled with Norm Salt, RDN, CDE for diabetes education.  - I have approached her with the following individualized plan to manage  her diabetes and patient agrees:   -In light of her presentation with near target glycemic profile, she will not need prandial insulin for now.  I discussed and increased her Basaglar to 25 units nightly, associated with monitoring of blood glucose twice daily-daily before breakfast and at bedtime.    - she is warned not to take insulin without proper monitoring per orders.  - she is encouraged to call clinic for blood glucose levels less than 70 or above 200 mg /dl. - she is advised to continue Metformin 1000 mg p.o. daily at breakfast,  therapeutically suitable for patient .  - she will be considered for incretin therapy as appropriate next visit.  -  Specific targets for  A1c;  LDL, HDL,  and Triglycerides were discussed with the patient.  2) Blood Pressure /Hypertension: Her blood pressure is controlled to target. she is advised to continue her current medications including losartan 50 mg p.o. daily with breakfast . 3) Lipids/Hyperlipidemia:   Review of her recent lipid panel showed  controlled  LDL at 68 .  she  is advised to continue atorvastatin 20 mg p.o. daily at bedtime. Side effects and precautions discussed with her.  4)  Weight/Diet:  Body mass index is 35.43 kg/m.  -  clearly complicating her diabetes care.   she is  a candidate for weight loss. I discussed with her the fact that loss of 5 - 10% of her  current body weight will have the most impact on her diabetes management.  Exercise, and detailed carbohydrates information provided  -  detailed on discharge instructions.  5) Chronic Care/Health Maintenance:  -she  is on ACEI/ARB and Statin medications and  is encouraged to initiate and continue to follow up with Ophthalmology, Dentist,  Podiatrist at least yearly or according to recommendations, and advised to   stay away from smoking. I have recommended yearly flu vaccine and pneumonia vaccine at least every 5 years; moderate intensity exercise for up to 150 minutes weekly; and  sleep for at least 7 hours a day.  - she is  advised to maintain close follow up with [facility], Velna Hatchet, MD for primary care needs, as well as her other providers for optimal and coordinated care.  - Time spent on this patient care encounter:  35 min, of which > 50% was spent in  counseling and the rest reviewing her blood glucose logs , discussing her hypoglycemia and hyperglycemia episodes, reviewing her current and  previous labs / studies  ( including abstraction from other facilities) and medications  doses and developing a  long term treatment plan and documenting her care.   Please refer to Patient Instructions for Blood Glucose Monitoring and  Insulin/Medications Dosing Guide"  in media tab for additional information. Please  also refer to " Patient Self Inventory" in the Media  tab for reviewed elements of pertinent patient history.  Stephanie Hammond participated in the discussions, expressed understanding, and voiced agreement with the above plans.  All questions were answered to her satisfaction. she is encouraged to contact clinic should she have any questions or concerns prior to her return visit.   Follow up plan: - Return in about 4 months (around 09/05/2020) for Bring Meter and Logs- A1c in Office, ABI in Office NV, Urine MA - NV.  Marquis Lunch, MD [facility] [address] [place], [place] [address] Phone: [phone]  Fax: [phone]    05/06/2020, 9:21 PM  This note was partially dictated with voice recognition software. Similar sounding words can be transcribed inadequately or may not  be corrected upon review.

## 2020-05-06 NOTE — Patient Instructions (Signed)

## 2020-05-10 ENCOUNTER — Other Ambulatory Visit: Payer: Self-pay | Admitting: *Deleted

## 2020-05-10 ENCOUNTER — Telehealth: Payer: Self-pay

## 2020-05-10 MED ORDER — OXYCODONE-ACETAMINOPHEN 7.5-325 MG PO TABS
1.0000 | ORAL_TABLET | Freq: Four times a day (QID) | ORAL | 0 refills | Status: DC | PRN
Start: 2020-05-10 — End: 2020-06-07

## 2020-05-10 NOTE — Telephone Encounter (Signed)
Received call from patient.   Requested refill on Oxycodone/APAP.   Ok to refill??  Last office visit 03/19/2020.  Last refill 04/13/2020.

## 2020-05-10 NOTE — Telephone Encounter (Signed)
Increase basaglar to 35 units qhs.

## 2020-05-10 NOTE — Telephone Encounter (Signed)
Patient notified of recommendation, verbalized understanding

## 2020-05-10 NOTE — Telephone Encounter (Signed)
Patient said that ever since Dr Dorris Fetch changed her insulin dosage, her sugar is not acting right. It was over 400.

## 2020-05-10 NOTE — Telephone Encounter (Signed)
Returned call to patient she stated that her BG was 434 on Sat. In the afternoon, Sunday 239 am and 292 in pm, today 247 am and 305 at noon, please advise.

## 2020-05-13 DIAGNOSIS — M79671 Pain in right foot: Secondary | ICD-10-CM | POA: Diagnosis not present

## 2020-05-13 DIAGNOSIS — M722 Plantar fascial fibromatosis: Secondary | ICD-10-CM | POA: Diagnosis not present

## 2020-05-31 NOTE — Telephone Encounter (Signed)
Patient advised of the change, verbalized understanding, stated that she will do as instructed.

## 2020-05-31 NOTE — Telephone Encounter (Signed)
She has to increase Basaglar to 35 units QHS, continue to monitor ac and hs ,call back if >200 x 3. Hold off Novolog until next encounter.

## 2020-05-31 NOTE — Telephone Encounter (Signed)
Left a message requesting a return call to the office. 

## 2020-05-31 NOTE — Telephone Encounter (Signed)
Spoke with patient, her current readings are 10/25  323 am, 466 @150pm ,10/24 920pm 289, 756pm 334, 254pm 379, 1042am 411, 820am 289, 10/23 1129pm 299. Patient di not increase her Basaglar  to 35 units as instructed last time, she also informed me that she is still usinf novolog 5 units tid that was ordered by PCP prior to her referral to come here, stated that she was not told to d/c it. Please advise.

## 2020-05-31 NOTE — Telephone Encounter (Signed)
Pt left a VM that her Diabetes is getting out of control again and needs a return call please

## 2020-06-04 ENCOUNTER — Telehealth: Payer: Self-pay | Admitting: "Endocrinology

## 2020-06-04 DIAGNOSIS — R69 Illness, unspecified: Secondary | ICD-10-CM | POA: Diagnosis not present

## 2020-06-04 NOTE — Telephone Encounter (Signed)
Spoke to patient, patient will come by on Monday and get 2 samples of basaglar. Okay per Maudie Mercury.

## 2020-06-07 ENCOUNTER — Other Ambulatory Visit: Payer: Self-pay | Admitting: *Deleted

## 2020-06-07 DIAGNOSIS — E118 Type 2 diabetes mellitus with unspecified complications: Secondary | ICD-10-CM

## 2020-06-07 NOTE — Telephone Encounter (Signed)
Received call from patient.   Requested refill on Oxycodone/APAP.   Ok to refill??  Last office visit 03/19/2020.  Last refill 05/10/2020.  Also inquired as to if she could be referred to another endocrinologist for her DM. States that she would like to see Dr. Chalmers Cater.

## 2020-06-08 MED ORDER — OXYCODONE-ACETAMINOPHEN 7.5-325 MG PO TABS
1.0000 | ORAL_TABLET | Freq: Four times a day (QID) | ORAL | 0 refills | Status: DC | PRN
Start: 2020-06-08 — End: 2020-07-05

## 2020-06-08 NOTE — Telephone Encounter (Signed)
Referral placed.

## 2020-06-10 ENCOUNTER — Telehealth: Payer: Self-pay | Admitting: Family Medicine

## 2020-06-10 NOTE — Telephone Encounter (Signed)
Referral has been placed to Dr. Chalmers Cater for DM.

## 2020-06-10 NOTE — Telephone Encounter (Signed)
Need pt labs, insurance infromation fax over to schdule appt [phone]

## 2020-06-17 NOTE — Telephone Encounter (Signed)
I have faxed requested information to them release # [id number]

## 2020-06-28 DIAGNOSIS — R69 Illness, unspecified: Secondary | ICD-10-CM | POA: Diagnosis not present

## 2020-07-05 ENCOUNTER — Other Ambulatory Visit: Payer: Self-pay | Admitting: *Deleted

## 2020-07-05 MED ORDER — OXYCODONE-ACETAMINOPHEN 7.5-325 MG PO TABS
1.0000 | ORAL_TABLET | Freq: Four times a day (QID) | ORAL | 0 refills | Status: DC | PRN
Start: 2020-07-05 — End: 2020-08-02

## 2020-07-05 NOTE — Telephone Encounter (Signed)
Ok to refill??  Last office visit 06/10/2020.  Last refill 06/08/2020.

## 2020-07-06 ENCOUNTER — Other Ambulatory Visit: Payer: Self-pay | Admitting: Family Medicine

## 2020-07-25 ENCOUNTER — Other Ambulatory Visit: Payer: Self-pay | Admitting: Family Medicine

## 2020-07-26 DIAGNOSIS — R69 Illness, unspecified: Secondary | ICD-10-CM | POA: Diagnosis not present

## 2020-08-02 ENCOUNTER — Other Ambulatory Visit: Payer: Self-pay | Admitting: *Deleted

## 2020-08-02 MED ORDER — OXYCODONE-ACETAMINOPHEN 7.5-325 MG PO TABS: 1.0000 | ORAL_TABLET | Freq: Four times a day (QID) | ORAL | 0 refills | Status: DC | PRN

## 2020-08-02 NOTE — Telephone Encounter (Signed)
Received call from patient.   Requested refill on Oxycodone/APAP.   Ok to refill??  Last office visit 03/19/2020.  Last refill 07/05/2020.

## 2020-08-11 ENCOUNTER — Ambulatory Visit: Payer: Medicare HMO | Admitting: Family Medicine

## 2020-08-13 ENCOUNTER — Ambulatory Visit: Payer: Medicare HMO | Admitting: Nurse Practitioner

## 2020-08-16 ENCOUNTER — Ambulatory Visit: Payer: Medicare HMO | Admitting: Family Medicine

## 2020-08-18 ENCOUNTER — Telehealth (INDEPENDENT_AMBULATORY_CARE_PROVIDER_SITE_OTHER): Payer: Medicare HMO | Admitting: Family Medicine

## 2020-08-18 ENCOUNTER — Encounter: Payer: Self-pay | Admitting: Family Medicine

## 2020-08-18 ENCOUNTER — Other Ambulatory Visit: Payer: Self-pay

## 2020-08-18 ENCOUNTER — Telehealth: Payer: Self-pay

## 2020-08-18 ENCOUNTER — Other Ambulatory Visit: Payer: Self-pay | Admitting: Family Medicine

## 2020-08-18 DIAGNOSIS — M25561 Pain in right knee: Secondary | ICD-10-CM

## 2020-08-18 DIAGNOSIS — R69 Illness, unspecified: Secondary | ICD-10-CM | POA: Diagnosis not present

## 2020-08-18 DIAGNOSIS — G4700 Insomnia, unspecified: Secondary | ICD-10-CM

## 2020-08-18 DIAGNOSIS — G8929 Other chronic pain: Secondary | ICD-10-CM

## 2020-08-18 DIAGNOSIS — F5101 Primary insomnia: Secondary | ICD-10-CM | POA: Insufficient documentation

## 2020-08-18 DIAGNOSIS — I2510 Atherosclerotic heart disease of native coronary artery without angina pectoris: Secondary | ICD-10-CM | POA: Diagnosis not present

## 2020-08-18 DIAGNOSIS — M25562 Pain in left knee: Secondary | ICD-10-CM | POA: Diagnosis not present

## 2020-08-18 DIAGNOSIS — E1141 Type 2 diabetes mellitus with diabetic mononeuropathy: Secondary | ICD-10-CM | POA: Diagnosis not present

## 2020-08-18 DIAGNOSIS — M1712 Unilateral primary osteoarthritis, left knee: Secondary | ICD-10-CM | POA: Diagnosis not present

## 2020-08-18 DIAGNOSIS — Z794 Long term (current) use of insulin: Secondary | ICD-10-CM

## 2020-08-18 DIAGNOSIS — E1140 Type 2 diabetes mellitus with diabetic neuropathy, unspecified: Secondary | ICD-10-CM

## 2020-08-18 DIAGNOSIS — F320 Major depressive disorder, single episode, mild: Secondary | ICD-10-CM

## 2020-08-18 MED ORDER — TRAZODONE HCL 50 MG PO TABS: 25.0000 mg | ORAL_TABLET | Freq: Every evening | ORAL | 3 refills | Status: DC | PRN

## 2020-08-18 NOTE — Telephone Encounter (Signed)
error 

## 2020-08-18 NOTE — Progress Notes (Signed)
Virtual Visit via Telephone Note  I connected with Stephanie Hammond on 08/18/20 at  4:01pm by telephone and verified that I am speaking with the correct person using two identifiers.   LVM 3:45pm   Location: Patient: at home  Provider: In office    I discussed the limitations, risks, security and privacy concerns of performing an evaluation and management service by telephone and the availability of in person appointments. I also discussed with the patient that there may be a patient responsible charge related to this service. The patient expressed understanding and agreed to proceed.   History of Present Illness: Telehealth visit in the setting of the COVID-19 pandemic.  Patient with history of coronary artery disease hypertension type 2 diabetes mellitus.  She does follow with endocrinology however her last visit was in 04-17-2020.  She has very elevated blood sugar readings despite having lower A1c's.  At her last visit she was advised to take Basaglar 30 units at bedtime continue metformin 1000 mg once a day she is scheduled to return to endocrinology at the end of January. States sugars are still up and down, she can usually tell they are abnormal if she gets a buzzing sensation in her head     He already states that his hypertension she is currently on losartan 50 mg once a day no side effects of the medication  Hyperlipidemia  she is taking Lipitor 20 mg at bedtime, last LDL 38 in August 2021  Chronic back pain with degenerative disc disease/ OA she is on oxycodone 7.5 mg / 325 mg she gets 80 tablets/month She has had recurrent episodes of swelling pain in left knee, the one she was surgeon on last yeat  needs referral back to Dr. Delfino Lovett, no recent injury states it has been occurring for months intermittantly and makes her limp. The pain is below the knee most of time down to her shin, other times medial knee  Using topical anti-inflammatory helps   Her husband died in 18-Apr-2023,  states she has not been sleeping very well.  She does have support from her son is decided that she will stay within her home instead of moving to [facility] to be with them.  She states that she is waking up throughout the night and often will eat something and says she is able to fall back asleep.  She states that her friend stated that she seems like she is depressed she states she is not sure if she really is but is is not resting very well. COVID-19 vaccine up-to-date   Observations/Objective: Pt did not have vitals  Unable to observe, no acute distress noted over the phone   Assessment and Plan: Coronary artery disease/hypertension she is taking her losartan as prescribed she is on statin drug she has not had any symptoms.  Diabetes mellitus type 2 very labile blood sugars despite her A1c typically in the normal range.  She will continue to follow-up with endocrinology as her sugars are very difficult to control. We will start treatment for her depression and insomnia hopefully this will keep her from eating in the middle the night which can fluctuate her sugars even more.  Left knee pain status post knee replacement referral back to orthopedics for reevaluation Continue topical and pain meds   She does state that she was trying to get life insurance and there was a medication  orthopedics put her on  temporarily that they needed some type of letter stating that she  was not on the medicine any longer.  She did not have the name of the medication.  I have given her my fax number any some type of documentation from the insurance company about the medication of concern.  I am assuming that they are referring to a blood thinner that she took for prophylaxis for short period of time.   MDD/insomnia-Depression and insomnia in the setting of recent loss of her husband. I will start her on trazodone at bedtime.  Advised that she can take as needed.  She does not want to go on a daily medication at  this time.  She does have support from her family.  Follow Up Instructions:    I discussed the assessment and treatment plan with the patient. The patient was provided an opportunity to ask questions and all were answered. The patient agreed with the plan and demonstrated an understanding of the instructions.   The patient was advised to call back or seek an in-person evaluation if the symptoms worsen or if the condition fails to improve as anticipated.  I provided 14 minutes of non-face-to-face time during this encounter. End time: 4:18pm  Vic Blackbird, MD

## 2020-08-18 NOTE — Assessment & Plan Note (Signed)
Depression and insomnia in the setting of recent loss of her husband.  We will start her on trazodone at bedtime.  Advised that she can take as needed.  She does not want to go on a daily medication at this time.  She does have support from her family.

## 2020-08-25 ENCOUNTER — Telehealth: Payer: Self-pay | Admitting: *Deleted

## 2020-08-25 NOTE — Telephone Encounter (Signed)
I spoke with her about this. I need the life insurance person to send [place] a letter or whatever document they need completed showing she is not on this medication. I can write her a general letter stating she was on in the past, but it would be best to respond to them directly

## 2020-08-25 NOTE — Telephone Encounter (Signed)
Received call from patient.   Reports that she has been in contact with Wilburn Mylar, with [facility] [phone]~ telephone.   Reports that her life insurance policy cannot be completed at this time as she has prescription for a blood thinner.  States that she has not had medication for >1 year. Reports that she was taking Eliquis S/P total knee replacement, but medication was stopped after 3 months.   MD please advise.

## 2020-08-26 NOTE — Telephone Encounter (Signed)
Call placed to patient and patient made aware.   Requested general letter for [employer].

## 2020-08-27 NOTE — Telephone Encounter (Signed)
Letter written

## 2020-08-30 ENCOUNTER — Other Ambulatory Visit: Payer: Self-pay | Admitting: *Deleted

## 2020-08-30 NOTE — Telephone Encounter (Signed)
Received call from patient.   Requested refill on Oxycodone/APAP.   Ok to refill??  Last office visit 08/18/2020.  Last refill 08/02/2020.

## 2020-08-31 MED ORDER — OXYCODONE-ACETAMINOPHEN 7.5-325 MG PO TABS: 1.0000 | ORAL_TABLET | Freq: Four times a day (QID) | ORAL | 0 refills | Status: DC | PRN

## 2020-09-01 ENCOUNTER — Ambulatory Visit: Payer: Medicare HMO | Admitting: "Endocrinology

## 2020-09-06 ENCOUNTER — Ambulatory Visit: Payer: Medicare HMO | Admitting: "Endocrinology

## 2020-09-07 ENCOUNTER — Other Ambulatory Visit: Payer: Self-pay

## 2020-09-07 ENCOUNTER — Telehealth: Payer: Self-pay

## 2020-09-07 ENCOUNTER — Ambulatory Visit: Payer: Medicare HMO | Admitting: "Endocrinology

## 2020-09-07 DIAGNOSIS — E1140 Type 2 diabetes mellitus with diabetic neuropathy, unspecified: Secondary | ICD-10-CM

## 2020-09-07 NOTE — Telephone Encounter (Signed)
Pt requested another referral for endo, referral placed

## 2020-09-08 ENCOUNTER — Other Ambulatory Visit: Payer: Self-pay | Admitting: *Deleted

## 2020-09-08 MED ORDER — BASAGLAR KWIKPEN 100 UNIT/ML ~~LOC~~ SOPN: 25.0000 [IU] | PEN_INJECTOR | Freq: Every day | SUBCUTANEOUS | 3 refills | Status: DC

## 2020-09-10 ENCOUNTER — Ambulatory Visit: Payer: Medicare HMO | Admitting: Family Medicine

## 2020-09-13 ENCOUNTER — Ambulatory Visit (HOSPITAL_COMMUNITY): Payer: Medicare HMO

## 2020-09-13 ENCOUNTER — Ambulatory Visit: Payer: Medicare HMO | Admitting: "Endocrinology

## 2020-09-17 ENCOUNTER — Other Ambulatory Visit: Payer: Self-pay | Admitting: Family Medicine

## 2020-09-20 DIAGNOSIS — M7052 Other bursitis of knee, left knee: Secondary | ICD-10-CM | POA: Diagnosis not present

## 2020-09-22 DIAGNOSIS — I10 Essential (primary) hypertension: Secondary | ICD-10-CM | POA: Diagnosis not present

## 2020-09-22 DIAGNOSIS — G8929 Other chronic pain: Secondary | ICD-10-CM | POA: Diagnosis not present

## 2020-09-22 DIAGNOSIS — E1140 Type 2 diabetes mellitus with diabetic neuropathy, unspecified: Secondary | ICD-10-CM | POA: Diagnosis not present

## 2020-09-22 DIAGNOSIS — Z794 Long term (current) use of insulin: Secondary | ICD-10-CM | POA: Diagnosis not present

## 2020-09-22 DIAGNOSIS — G4700 Insomnia, unspecified: Secondary | ICD-10-CM | POA: Diagnosis not present

## 2020-09-22 DIAGNOSIS — R69 Illness, unspecified: Secondary | ICD-10-CM | POA: Diagnosis not present

## 2020-09-22 DIAGNOSIS — E1165 Type 2 diabetes mellitus with hyperglycemia: Secondary | ICD-10-CM | POA: Diagnosis not present

## 2020-09-22 DIAGNOSIS — E785 Hyperlipidemia, unspecified: Secondary | ICD-10-CM | POA: Diagnosis not present

## 2020-09-27 ENCOUNTER — Ambulatory Visit (HOSPITAL_COMMUNITY)
Admission: RE | Admit: 2020-09-27 | Discharge: 2020-09-27 | Disposition: A | Discharge status: Home / Self Care | Payer: Medicare HMO | Source: Ambulatory Visit | Attending: Family Medicine | Admitting: Family Medicine

## 2020-09-27 ENCOUNTER — Other Ambulatory Visit: Payer: Self-pay

## 2020-09-27 DIAGNOSIS — Z1231 Encounter for screening mammogram for malignant neoplasm of breast: Secondary | ICD-10-CM | POA: Diagnosis not present

## 2020-09-28 ENCOUNTER — Encounter: Payer: Self-pay | Admitting: Physical Therapy

## 2020-09-28 ENCOUNTER — Ambulatory Visit: Payer: Medicare HMO | Attending: Orthopedic Surgery | Admitting: Physical Therapy

## 2020-09-28 ENCOUNTER — Other Ambulatory Visit: Payer: Self-pay

## 2020-09-28 ENCOUNTER — Other Ambulatory Visit: Payer: Self-pay | Admitting: *Deleted

## 2020-09-28 DIAGNOSIS — M25562 Pain in left knee: Secondary | ICD-10-CM | POA: Diagnosis present

## 2020-09-28 DIAGNOSIS — G8929 Other chronic pain: Secondary | ICD-10-CM | POA: Insufficient documentation

## 2020-09-28 MED ORDER — MELOXICAM 15 MG PO TABS: 15.0000 mg | ORAL_TABLET | Freq: Every day | ORAL | 0 refills | Status: DC

## 2020-09-28 NOTE — Therapy (Signed)
[facility] [place], [place], [address] Phone: [phone]   Fax:  [phone]  Physical Therapy Evaluation  Patient Details  Name: Stephanie Hammond MRN: [id number] Date of Birth: 09-Aug-1950 Referring Provider (PT): Rod Can MD   Encounter Date: 09/28/2020   PT End of Session - 09/28/20 1037    Visit Number 1    Number of Visits 12    Date for PT Re-Evaluation 10/26/20    Authorization Type FOTO AT LEAST EVERY 5TH VISIT.  PROGRESS NOTE AT 10TH VISIT.  KX MODIFIER AFTER 15 VISITS.    PT Start Time 0815    PT Stop Time [phone]    PT Time Calculation (min) 27 min    Activity Tolerance Patient tolerated treatment well    Behavior During Therapy [facility] for tasks assessed/performed           Past Medical History:  Diagnosis Date  . Anemia   . Blood transfusion 2009   after GI bleed  . CAD (coronary artery disease)   . Diabetes mellitus    2  . Diverticulitis   . Diverticulosis   . Fear of local anesthetic    01-02-2019, patient states with last surgery in 2016 , she refused to have epidural before she was sedated d/t to extreme aversion to needles near her back    . GERD (gastroesophageal reflux disease)   . Gout   . Headache    occ if sugar is  too hi or low   . Heart murmur    "i was told this years and years and years ago "  . History of GI diverticular bleed 08/2004  . Hypertension   . IBS (irritable bowel syndrome)   . Osteoarthritis   . Tachycardia    "it stays like that for years between 119 and 121 and it doesnt bother me  none "     Past Surgical History:  Procedure Laterality Date  . CHOLECYSTECTOMY  2003  . COLONOSCOPY  2000   Dr. Fuller Plan: marked diverticulosis, difficult procedure due to adhesions, polyps benign  . COLONOSCOPY  2006   Dr. Henrene Pastor: severe pandiverticulosis  . COLONOSCOPY N/A 02/03/2013   SLF: 4 COLORECTAL POLYPS REMOVED/Moderate diverticulosis throughout the entire examined  colon/Small internal hemorrhoids  . COLONOSCOPY WITH PROPOFOL N/A 04/29/2019   Procedure: COLONOSCOPY WITH PROPOFOL;  Surgeon: Danie Binder, MD;  [facility];  Service: Endoscopy;  Laterality: N/A;  8:30am  . JOINT REPLACEMENT Right 2009   Knee Replacement Right   . POLYPECTOMY  04/29/2019   Procedure: POLYPECTOMY;  Surgeon: Danie Binder, MD;  [facility];  Service: Endoscopy;;  colon  . PTCA     over 10 years ago , unsure of exact year , does recall that no stents were placed   . REPLACEMENT TOTAL KNEE     right   . TOTAL KNEE ARTHROPLASTY Left 03/12/2015   Procedure: LEFT TOTAL KNEE REPLACEMENT;  Surgeon: Carole Civil, MD;  Location: [facility];  Service: Orthopedics;  Laterality: Left;  . TOTAL KNEE REVISION Left 01/09/2019   Procedure: TOTAL KNEE REVISION;  Surgeon: Rod Can, MD;  [facility]: [facility];  Service: Orthopedics;  Laterality: Left;  [name] VAGINAL HYSTERECTOMY      There were no vitals filed for this visit.    Subjective Assessment - 09/28/20 1030    Subjective COVID-19 screen performed prior to patient entering clinic.  The patient presents to the clinic today  Plan - 09/28/20 1043    Clinical Impression Statement The patient presents to OPPT with c/o chronic left knee pain that limits her functional mobility and ability to walk for long periods of time due to pain.  She was found to be very tender to palpation over her left Pes Anserine  region.  She lacks some left knee flexion but her strength is quite good.  Patient will benefit from skilled physical therapy intervention to address pain and deficits.    Personal Factors and Comorbidities Comorbidity 1;Comorbidity 2    Comorbidities Left TKA and revision, HTN, DM, CAD.    Examination-Activity Limitations Locomotion Level;Other    Examination-Participation Restrictions Other    Stability/Clinical Decision Making Evolving/Moderate complexity    Clinical Decision Making Low    Rehab Potential Good    PT Frequency 3x / week    PT Duration 4 weeks    PT Treatment/Interventions ADLs/Self Care Home Management;Cryotherapy;Electrical Stimulation;Iontophoresis 4mg /ml Dexamethasone;Moist Heat;Therapeutic exercise;Therapeutic activities;Functional mobility training;Patient/family education;Manual techniques;Passive range of motion;Vasopneumatic Device    PT Next Visit Plan E'stim, vaso, gentle STW/M/IASTM, Iontophoresis (per signed cert), pain-free ther ex.    Consulted and Agree with Plan of Care Patient           Patient will benefit from skilled therapeutic intervention in order to improve the following deficits and impairments:  Pain,Decreased activity tolerance,Decreased range of motion  Visit Diagnosis: Chronic pain of left knee - Plan: PT plan of care cert/re-cert     Problem List Patient Active Problem List   Diagnosis Date Noted  . Insomnia 08/18/2020  . Colovaginal fistula 05/13/2019  . History of diverticulitis 03/19/2019  . Failed total knee, left, initial encounter ([place]) 01/09/2019  . Anemia 12/31/2015  . Arthritis of knee, degenerative 03/12/2015  . Arthritis of left knee   . Gout 09/07/2014  . Back pain with left-sided radiculopathy 09/07/2014  . Type 2 diabetes mellitus with diabetic neuropathy ([place]) 02/14/2014  . Hyperlipidemia 03/12/2013  . IBS (irritable bowel syndrome) 01/09/2013  . Diabetic neuropathy ([employer]) 11/14/2012  . Diarrhea 09/13/2012  .  Major depression, single episode 09/13/2012  . Essential hypertension, benign 06/09/2012  . DDD (degenerative disc disease), lumbar 03/28/2012  . Chronic knee pain 03/28/2012  . Obesity 03/28/2012  . CAD (coronary artery disease) 03/28/2012    Tyrik Stetzer, [place] MPT 09/28/2020, 10:59 AM  [facility] [address] [place], [place], [address] Phone: [phone]   Fax:  [phone]  Name: Stephanie Hammond MRN: [id number] Date of Birth: 23-Jan-1951  [facility] [place], [place], [address] Phone: [phone]   Fax:  [phone]  Physical Therapy Evaluation  Patient Details  Name: Stephanie Hammond MRN: [id number] Date of Birth: 09-Aug-1950 Referring Provider (PT): Rod Can MD   Encounter Date: 09/28/2020   PT End of Session - 09/28/20 1037    Visit Number 1    Number of Visits 12    Date for PT Re-Evaluation 10/26/20    Authorization Type FOTO AT LEAST EVERY 5TH VISIT.  PROGRESS NOTE AT 10TH VISIT.  KX MODIFIER AFTER 15 VISITS.    PT Start Time 0815    PT Stop Time [phone]    PT Time Calculation (min) 27 min    Activity Tolerance Patient tolerated treatment well    Behavior During Therapy [facility] for tasks assessed/performed           Past Medical History:  Diagnosis Date  . Anemia   . Blood transfusion 2009   after GI bleed  . CAD (coronary artery disease)   . Diabetes mellitus    2  . Diverticulitis   . Diverticulosis   . Fear of local anesthetic    01-02-2019, patient states with last surgery in 2016 , she refused to have epidural before she was sedated d/t to extreme aversion to needles near her back    . GERD (gastroesophageal reflux disease)   . Gout   . Headache    occ if sugar is  too hi or low   . Heart murmur    "i was told this years and years and years ago "  . History of GI diverticular bleed 08/2004  . Hypertension   . IBS (irritable bowel syndrome)   . Osteoarthritis   . Tachycardia    "it stays like that for years between 119 and 121 and it doesnt bother me  none "     Past Surgical History:  Procedure Laterality Date  . CHOLECYSTECTOMY  2003  . COLONOSCOPY  2000   Dr. Fuller Plan: marked diverticulosis, difficult procedure due to adhesions, polyps benign  . COLONOSCOPY  2006   Dr. Henrene Pastor: severe pandiverticulosis  . COLONOSCOPY N/A 02/03/2013   SLF: 4 COLORECTAL POLYPS REMOVED/Moderate diverticulosis throughout the entire examined  colon/Small internal hemorrhoids  . COLONOSCOPY WITH PROPOFOL N/A 04/29/2019   Procedure: COLONOSCOPY WITH PROPOFOL;  Surgeon: Danie Binder, MD;  [facility];  Service: Endoscopy;  Laterality: N/A;  8:30am  . JOINT REPLACEMENT Right 2009   Knee Replacement Right   . POLYPECTOMY  04/29/2019   Procedure: POLYPECTOMY;  Surgeon: Danie Binder, MD;  [facility];  Service: Endoscopy;;  colon  . PTCA     over 10 years ago , unsure of exact year , does recall that no stents were placed   . REPLACEMENT TOTAL KNEE     right   . TOTAL KNEE ARTHROPLASTY Left 03/12/2015   Procedure: LEFT TOTAL KNEE REPLACEMENT;  Surgeon: Carole Civil, MD;  Location: [facility];  Service: Orthopedics;  Laterality: Left;  . TOTAL KNEE REVISION Left 01/09/2019   Procedure: TOTAL KNEE REVISION;  Surgeon: Rod Can, MD;  [facility];  Service: Orthopedics;  Laterality: Left;  [name] VAGINAL HYSTERECTOMY      There were no vitals filed for this visit.    Subjective Assessment - 09/28/20 1030    Subjective COVID-19 screen performed prior to patient entering clinic.  The patient presents to the clinic today

## 2020-09-28 NOTE — Telephone Encounter (Signed)
Received call from patient.   Requested refill on Oxycodone/APAP.   Ok to refill??  Last office visit 03/19/2020.  Last refill 08/31/2020.

## 2020-09-29 MED ORDER — OXYCODONE-ACETAMINOPHEN 7.5-325 MG PO TABS: 1.0000 | ORAL_TABLET | Freq: Four times a day (QID) | ORAL | 0 refills | Status: DC | PRN

## 2020-09-30 ENCOUNTER — Ambulatory Visit: Payer: Medicare HMO | Admitting: Physical Therapy

## 2020-10-04 ENCOUNTER — Other Ambulatory Visit: Payer: Self-pay

## 2020-10-04 ENCOUNTER — Ambulatory Visit: Payer: Medicare HMO | Admitting: Physical Therapy

## 2020-10-04 ENCOUNTER — Encounter: Payer: Self-pay | Admitting: Family Medicine

## 2020-10-04 ENCOUNTER — Ambulatory Visit (INDEPENDENT_AMBULATORY_CARE_PROVIDER_SITE_OTHER): Payer: Medicare HMO | Admitting: Family Medicine

## 2020-10-04 VITALS — BP 138/64 | HR 100 | Temp 98.3°F | Resp 14 | Ht 63.0 in | Wt 209.0 lb

## 2020-10-04 DIAGNOSIS — M25562 Pain in left knee: Secondary | ICD-10-CM | POA: Diagnosis not present

## 2020-10-04 DIAGNOSIS — E1141 Type 2 diabetes mellitus with diabetic mononeuropathy: Secondary | ICD-10-CM

## 2020-10-04 DIAGNOSIS — Z794 Long term (current) use of insulin: Secondary | ICD-10-CM

## 2020-10-04 DIAGNOSIS — E785 Hyperlipidemia, unspecified: Secondary | ICD-10-CM | POA: Diagnosis not present

## 2020-10-04 DIAGNOSIS — G8929 Other chronic pain: Secondary | ICD-10-CM

## 2020-10-04 DIAGNOSIS — I2510 Atherosclerotic heart disease of native coronary artery without angina pectoris: Secondary | ICD-10-CM

## 2020-10-04 DIAGNOSIS — R519 Headache, unspecified: Secondary | ICD-10-CM | POA: Diagnosis not present

## 2020-10-04 DIAGNOSIS — I10 Essential (primary) hypertension: Secondary | ICD-10-CM | POA: Diagnosis not present

## 2020-10-04 DIAGNOSIS — Z6837 Body mass index (BMI) 37.0-37.9, adult: Secondary | ICD-10-CM

## 2020-10-04 DIAGNOSIS — E1140 Type 2 diabetes mellitus with diabetic neuropathy, unspecified: Secondary | ICD-10-CM

## 2020-10-04 DIAGNOSIS — M5136 Other intervertebral disc degeneration, lumbar region: Secondary | ICD-10-CM

## 2020-10-04 LAB — GLUCOSE 16585: Glucose: 105 mg/dL — ABNORMAL HIGH (ref 65–99)

## 2020-10-04 NOTE — Patient Instructions (Signed)
PROM: Ankle Plantar / Dorsiflexion Stretching: Calf - Towel    Sit with knee straight and towel looped around left foot. Gently pull on towel until stretch is felt in calf. Hold __30__ seconds. Repeat __2-5__ times per set. Do __1-2__ sets per session. Do __2-3__ sessions per day.    Strengthening: Quadriceps Set   Tighten muscles on top of thighs by pushing knees down into surface. Hold __10__ seconds. Repeat _10___ times per set. Do __2-3__ sets per session. Do _2-3___ sessions per day.

## 2020-10-04 NOTE — Assessment & Plan Note (Signed)
Very labile blood sugars, in office 105 Given something to snack on Headache was improved  Keep basaglar at 30 units F/u new endocrinologist next month

## 2020-10-04 NOTE — Patient Instructions (Signed)
Keep appointment with new PCP

## 2020-10-04 NOTE — Therapy (Addendum)
[facility] [address] [place], [place], [address] Phone: [phone]   Fax:  [phone]  Physical Therapy Treatment  Patient Details  Name: Stephanie Hammond MRN: [id number] Date of Birth: Oct 09, 1950 Referring Provider (PT): Samson Frederic MD   Encounter Date: 10/04/2020   PT End of Session - 10/04/20 1009     Visit Number 2    Number of Visits 12    Date for PT Re-Evaluation 10/26/20    Authorization Type FOTO AT LEAST EVERY 5TH VISIT.  PROGRESS NOTE AT 10TH VISIT.  KX MODIFIER AFTER 15 VISITS.    PT Start Time 0945    PT Stop Time 1026    PT Time Calculation (min) 41 min    Activity Tolerance Patient tolerated treatment well    Behavior During Therapy [facility] for tasks assessed/performed             Past Medical History:  Diagnosis Date   Anemia    Blood transfusion 2009   after GI bleed   CAD (coronary artery disease)    Diabetes mellitus    2   Diverticulitis    Diverticulosis    Fear of local anesthetic    01-02-2019, patient states with last surgery in 2016 , she refused to have epidural before she was sedated d/t to extreme aversion to needles near her back     GERD (gastroesophageal reflux disease)    Gout    Headache    occ if sugar is  too hi or low    Heart murmur    "i was told this years and years and years ago "   History of GI diverticular bleed 08/2004   Hypertension    IBS (irritable bowel syndrome)    Osteoarthritis    Tachycardia    "it stays like that for years between 119 and 121 and it doesnt bother me  none "     Past Surgical History:  Procedure Laterality Date   CHOLECYSTECTOMY  2003   COLONOSCOPY  2000   Dr. Russella Dar: marked diverticulosis, difficult procedure due to adhesions, polyps benign   COLONOSCOPY  2006   Dr. Marina Goodell: severe pandiverticulosis   COLONOSCOPY N/A 02/03/2013   SLF: 4 COLORECTAL POLYPS REMOVED/Moderate diverticulosis throughout the entire examined colon/Small internal  hemorrhoids   COLONOSCOPY WITH PROPOFOL N/A 04/29/2019   Procedure: COLONOSCOPY WITH PROPOFOL;  Surgeon: West Bali, MD;  [facility];  Service: Endoscopy;  Laterality: N/A;  8:30am   JOINT REPLACEMENT Right 2009   Knee Replacement Right    POLYPECTOMY  04/29/2019   Procedure: POLYPECTOMY;  Surgeon: West Bali, MD;  [facility];  Service: Endoscopy;;  colon   PTCA     over 10 years ago , unsure of exact year , does recall that no stents were placed    REPLACEMENT TOTAL KNEE     right    TOTAL KNEE ARTHROPLASTY Left 03/12/2015   Procedure: LEFT TOTAL KNEE REPLACEMENT;  Surgeon: Vickki Hearing, MD;  Location: [facility];  Service: Orthopedics;  Laterality: Left;   TOTAL KNEE REVISION Left 01/09/2019   Procedure: TOTAL KNEE REVISION;  Surgeon: Samson Frederic, MD;  [facility];  Service: Orthopedics;  Laterality: Left;   VAGINAL HYSTERECTOMY      There were no vitals filed for this visit.   Subjective Assessment - 10/04/20 0948     Subjective COVID-19 screen performed prior to patient entering clinic.  Patient requested no exercises  today.    Pertinent History Left TKA and revision, HTN, DM, CAD.    How long can you walk comfortably? Short community distances.    Patient Stated Goals To be able to get around with less left knee pain.    Currently in Pain? Yes    Pain Score 4     Pain Location Knee    Pain Orientation Left    Pain Descriptors / Indicators Discomfort    Pain Type Chronic pain    Pain Onset More than a month ago    Pain Frequency Constant    Aggravating Factors  standing    Pain Relieving Factors rest                               [facility] Adult PT Treatment/Exercise - 10/04/20 0001       Modalities   Modalities Electrical Stimulation;Iontophoresis      Electrical Stimulation   Electrical Stimulation Location left medial knee    Electrical Stimulation Action premod    Electrical Stimulation Parameters 1-10hz   x11min    Electrical Stimulation Goals Pain      Iontophoresis   Type of Iontophoresis Dexamethasone    Location left medial knee    Dose 1.0 1of 6    Time 8      Manual Therapy   Manual Therapy Soft tissue mobilization    Manual therapy comments manual and IASTM with gentle pressure to left medial knee to decrease pain and tone                    PT Education - 10/04/20 1013     Education Details HEP    Person(s) Educated Patient    Methods Explanation;Handout;Demonstration    Comprehension Verbalized understanding                 PT Long Term Goals - 10/04/20 1010       PT LONG TERM GOAL #1   Title Patient will be independent with advanced HEP    Baseline issued initial today 10/04/20    Time 4    Period Weeks    Status On-going      PT LONG TERM GOAL #2   Title Walk a community distance with left knee pain not > 3/10.    Baseline pain at 4/10 today 10/04/20    Time 4    Period Weeks    Status On-going                   Plan - 10/04/20 1017     Clinical Impression Statement Patient tolerated treatment well today. Patient requested no exericses today. Today focused on gentle STW to medial knee to reduce pain and tone followed by ES and ionto today. Patient issued initial HEP to help stretch and strengthen gently. Patient goals ongoing today.    Personal Factors and Comorbidities Comorbidity 1;Comorbidity 2    Comorbidities Left TKA and revision, HTN, DM, CAD.    Examination-Activity Limitations Locomotion Level;Other    Examination-Participation Restrictions Other    Stability/Clinical Decision Making Evolving/Moderate complexity    Rehab Potential Good    PT Frequency 3x / week    PT Duration 4 weeks    PT Treatment/Interventions ADLs/Self Care Home Management;Cryotherapy;Electrical Stimulation;Iontophoresis 4mg /ml Dexamethasone;Moist Heat;Therapeutic exercise;Therapeutic activities;Functional mobility training;Patient/family  education;Manual techniques;Passive range of motion;Vasopneumatic Device    PT Next Visit Plan cont with POC for E'stim,  vaso, gentle STW/M/IASTM,ionto and pain-free ther ex.    Consulted and Agree with Plan of Care Patient             Patient will benefit from skilled therapeutic intervention in order to improve the following deficits and impairments:  Pain,Decreased activity tolerance,Decreased range of motion  Visit Diagnosis: Chronic pain of left knee     Problem List Patient Active Problem List   Diagnosis Date Noted   Insomnia 08/18/2020   Colovaginal fistula 05/13/2019   History of diverticulitis 03/19/2019   Failed total knee, left, initial encounter ([facility]) 01/09/2019   Anemia 12/31/2015   Arthritis of knee, degenerative 03/12/2015   Arthritis of left knee    Gout 09/07/2014   Back pain with left-sided radiculopathy 09/07/2014   Type 2 diabetes mellitus with diabetic neuropathy ([facility]) 02/14/2014   Hyperlipidemia 03/12/2013   IBS (irritable bowel syndrome) 01/09/2013   Diabetic neuropathy ([facility]) 11/14/2012   Diarrhea 09/13/2012   Major depression, single episode 09/13/2012   Essential hypertension, benign 06/09/2012   DDD (degenerative disc disease), lumbar 03/28/2012   Chronic knee pain 03/28/2012   Obesity 03/28/2012   CAD (coronary artery disease) 03/28/2012    Ruie Sendejo P, PTA 10/04/2020, 10:28 AM  [facility] [address] [place], [place], [address] Phone: [phone]   Fax:  [phone]  Name: Stephanie Hammond MRN: [id number] Date of Birth: Jan 02, 1951  PHYSICAL THERAPY DISCHARGE SUMMARY  Visits from Start of Care: 2.  Current functional level related to goals / functional outcomes: See above.   Remaining deficits: See below.   Education / Equipment:    Patient agrees to discharge. Patient goals were not met. Patient is being discharged due to not returning since the last  visit.    [place] App[place] MPT

## 2020-10-04 NOTE — Assessment & Plan Note (Signed)
On oxycodone acetominophen 80 tabs/month  Currently in PT from Orthopedics

## 2020-10-04 NOTE — Progress Notes (Signed)
Subjective:    Patient ID: Stephanie Hammond, female    DOB: January 08, 1951, 70 y.o.   MRN: [id number]  Patient presents for Follow-up (Is not fasting/) and Frequent HA (Daily HA)   Pt here here to f/u chronic medical probles  DM- has headaache typically gets HA when her sugars go up and down, it is not severe, doesn't happen often  cbg up sand down 127 tghis am, last  Night CBG was  319 She has an appt with new endocrinologist in March, advised to take Basagalr  37 units per report, she has been taking basaglar 30 Takes occ Novolog  5 units if  "high " Metformin 1000mg  XR in the morning    He already states that his hypertension she is currently on losartan 50 mg once a day and norvasc 5mg   no side effects of the medication  Hyperlipidemia  she is taking Lipitor 20 mg at bedtime, last LDL 38 in August 2021  Chronic back pain with degenerative disc disease/ OA she is on oxycodone 7.5 mg / 325 mg she gets 80 tablets/month She was referred back to her orthopedic last visit  Dr. Ricki Miller pain is below the knee most of time down to her shin, other times medial knee  Using topical anti-inflammatory helps, also getting PT , using tens unit    -     Review Of Systems:  GEN- denies fatigue, fever, weight loss,weakness, recent illness HEENT- denies eye drainage, change in vision, nasal discharge, CVS- denies chest pain, palpitations RESP- denies SOB, cough, wheeze ABD- denies N/V, change in stools, abd pain GU- denies dysuria, hematuria, dribbling, incontinence MSK-+ joint pain, muscle aches, injury Neuro- denies headache, dizziness, syncope, seizure activity       Objective:    BP 138/64   Pulse 100   Temp 98.3 F (36.8 C) (Temporal)   Resp 14   Ht 5\' 3"  (1.6 m)   Wt 209 lb (94.8 kg)   SpO2 98%   BMI 37.02 kg/m  GEN- NAD, alert and oriented x3,well appearing  HEENT- PERRL, EOMI, non injected sclera, pink conjunctiva, MMM, oropharynx clear Neck- Supple, no  thyromegaly CVS- RRR, no murmur RESP-CTAB ABD-NABS,soft,NT,ND NEURO-CNII-XII in tact no focal deficits  EXT- No edema Pulses- Radial, DP- 2+   CBG  105 in office      Assessment & Plan:      Problem List Items Addressed This Visit      Unprioritized   CAD (coronary artery disease) - Primary    Continue statin drug  Blood pressure controlled       Relevant Orders   CBC with Differential/Platelet   Comprehensive metabolic panel   Lipid panel   DDD (degenerative disc disease), lumbar    On oxycodone acetominophen 80 tabs/month  Currently in PT from Orthopedics       Diabetic neuropathy (HCC)   Essential hypertension, benign   Hyperlipidemia   Relevant Orders   Lipid panel   Obesity   Type 2 diabetes mellitus with diabetic neuropathy (HCC)    Very labile blood sugars, in office 105 Given something to snack on Headache was improved  Keep basaglar at 30 units F/u new endocrinologist next month       Relevant Orders   Hemoglobin A1c   HM DIABETES FOOT EXAM (Completed)   Microalbumin / creatinine urine ratio   Glucose, Random    Other Visit Diagnoses    Nonintractable headache, unspecified chronicity pattern, unspecified headache type  Note: This dictation was prepared with Dragon dictation along with smaller phrase technology. Any transcriptional errors that result from this process are unintentional.

## 2020-10-04 NOTE — Assessment & Plan Note (Signed)
Continue statin drug  Blood pressure controlled

## 2020-10-05 NOTE — Telephone Encounter (Signed)
No action needed at this time, closing open encounter

## 2020-10-06 LAB — MICROALBUMIN / CREATININE URINE RATIO
Creatinine, Urine: 56 mg/dL (ref 20–275)
Microalb Creat Ratio: 16 mcg/mg creat (ref <30)
Microalb, Ur: 0.9 mg/dL

## 2020-10-06 LAB — HEMOGLOBIN A1C

## 2020-10-06 LAB — LIPID PANEL

## 2020-10-06 LAB — COMPREHENSIVE METABOLIC PANEL

## 2020-10-06 LAB — CBC WITH DIFFERENTIAL/PLATELET

## 2020-10-11 ENCOUNTER — Encounter: Payer: Medicare HMO | Admitting: Physical Therapy

## 2020-10-18 DIAGNOSIS — E118 Type 2 diabetes mellitus with unspecified complications: Secondary | ICD-10-CM | POA: Diagnosis not present

## 2020-10-18 DIAGNOSIS — I700 Atherosclerosis of aorta: Secondary | ICD-10-CM | POA: Diagnosis not present

## 2020-10-18 DIAGNOSIS — E785 Hyperlipidemia, unspecified: Secondary | ICD-10-CM | POA: Diagnosis not present

## 2020-10-18 DIAGNOSIS — I10 Essential (primary) hypertension: Secondary | ICD-10-CM | POA: Diagnosis not present

## 2020-10-21 DIAGNOSIS — E1165 Type 2 diabetes mellitus with hyperglycemia: Secondary | ICD-10-CM | POA: Diagnosis not present

## 2020-10-25 ENCOUNTER — Other Ambulatory Visit: Payer: Self-pay

## 2020-10-25 ENCOUNTER — Ambulatory Visit (INDEPENDENT_AMBULATORY_CARE_PROVIDER_SITE_OTHER): Payer: Medicare HMO | Admitting: Internal Medicine

## 2020-10-25 ENCOUNTER — Encounter: Payer: Self-pay | Admitting: Internal Medicine

## 2020-10-25 VITALS — BP 121/70 | HR 111 | Resp 18 | Ht 63.0 in | Wt 208.1 lb

## 2020-10-25 DIAGNOSIS — G894 Chronic pain syndrome: Secondary | ICD-10-CM | POA: Diagnosis not present

## 2020-10-25 DIAGNOSIS — M25562 Pain in left knee: Secondary | ICD-10-CM | POA: Diagnosis not present

## 2020-10-25 DIAGNOSIS — M1000 Idiopathic gout, unspecified site: Secondary | ICD-10-CM | POA: Diagnosis not present

## 2020-10-25 DIAGNOSIS — E1140 Type 2 diabetes mellitus with diabetic neuropathy, unspecified: Secondary | ICD-10-CM

## 2020-10-25 DIAGNOSIS — Z79891 Long term (current) use of opiate analgesic: Secondary | ICD-10-CM | POA: Diagnosis not present

## 2020-10-25 DIAGNOSIS — Z8719 Personal history of other diseases of the digestive system: Secondary | ICD-10-CM | POA: Diagnosis not present

## 2020-10-25 DIAGNOSIS — I10 Essential (primary) hypertension: Secondary | ICD-10-CM

## 2020-10-25 DIAGNOSIS — Z7689 Persons encountering health services in other specified circumstances: Secondary | ICD-10-CM

## 2020-10-25 DIAGNOSIS — R000 Tachycardia, unspecified: Secondary | ICD-10-CM

## 2020-10-25 DIAGNOSIS — E785 Hyperlipidemia, unspecified: Secondary | ICD-10-CM | POA: Diagnosis not present

## 2020-10-25 DIAGNOSIS — Z794 Long term (current) use of insulin: Secondary | ICD-10-CM

## 2020-10-25 DIAGNOSIS — I700 Atherosclerosis of aorta: Secondary | ICD-10-CM | POA: Insufficient documentation

## 2020-10-25 DIAGNOSIS — E669 Obesity, unspecified: Secondary | ICD-10-CM | POA: Diagnosis not present

## 2020-10-25 DIAGNOSIS — M25561 Pain in right knee: Secondary | ICD-10-CM | POA: Diagnosis not present

## 2020-10-25 DIAGNOSIS — Z720 Tobacco use: Secondary | ICD-10-CM | POA: Diagnosis not present

## 2020-10-25 DIAGNOSIS — M542 Cervicalgia: Secondary | ICD-10-CM | POA: Diagnosis not present

## 2020-10-25 DIAGNOSIS — M5451 Vertebrogenic low back pain: Secondary | ICD-10-CM | POA: Diagnosis not present

## 2020-10-25 MED ORDER — AMLODIPINE BESYLATE 10 MG PO TABS: 10.0000 mg | ORAL_TABLET | Freq: Every day | ORAL | 1 refills | Status: DC

## 2020-10-25 MED ORDER — LOSARTAN POTASSIUM 100 MG PO TABS: 100.0000 mg | ORAL_TABLET | Freq: Every day | ORAL | 1 refills | Status: DC

## 2020-10-25 MED ORDER — ATORVASTATIN CALCIUM 20 MG PO TABS: 20.0000 mg | ORAL_TABLET | Freq: Every day | ORAL | 1 refills | Status: DC

## 2020-10-25 NOTE — Patient Instructions (Addendum)
Please continue taking medications as prescribed.  Continue to follow low salt diet and continue ambulation as tolerated.  Please quit smoking as soon as possible.

## 2020-10-25 NOTE — Assessment & Plan Note (Signed)
Chronic left knee and chronic low back pain Follows up with Dr. Merlene Laughter On Norco as needed and Mobic.

## 2020-10-25 NOTE — Assessment & Plan Note (Signed)
On Atorvastatin 

## 2020-10-25 NOTE — Assessment & Plan Note (Signed)
On Allopurinol Has not had gouty attack for many years Will check uric acid and decide need for Allopurinol

## 2020-10-25 NOTE — Assessment & Plan Note (Signed)
Tachycardia, resting HR > 100s Patient states that she has had elevated HR always, denies any palpitations. If elevated, plan for Echo in the next visit.

## 2020-10-25 NOTE — Assessment & Plan Note (Signed)
Care established Previous chart reviewed History and medications reviewed with the patient 

## 2020-10-25 NOTE — Assessment & Plan Note (Signed)
On Basaglar 30-35 U qHS and Novolog 5 U PRN for blood glucose > 250 On Metformin 500 mg QD Has freestyle Libre Follows up with Endocrinologist in [place]. On ARB and statin

## 2020-10-25 NOTE — Assessment & Plan Note (Signed)
Diet modification and ambulation as tolerated

## 2020-10-25 NOTE — Assessment & Plan Note (Signed)
BP Readings from Last 1 Encounters:  10/25/20 121/70   Well-controlled with Amlodipine and Losartan Counseled for compliance with the medications Advised DASH diet and moderate exercise/walking, at least 150 mins/week

## 2020-10-25 NOTE — Assessment & Plan Note (Signed)
S/p partial colectomy at [facility] due to chronic diarrhea according to the patient, denies diarrhea now

## 2020-10-25 NOTE — Assessment & Plan Note (Signed)
Smokes about 1 pack/day  Asked about quitting: confirms that she currently smokes cigarettes Advise to quit smoking: Educated about QUITTING to reduce the risk of cancer, cardio and cerebrovascular disease. Assess willingness: Unwilling to quit at this time, but is working on cutting back. Assist with counseling and pharmacotherapy: Counseled for 5 minutes and literature provided. Arrange for follow up: Follow up in 3 months and continue to offer help. 

## 2020-10-29 ENCOUNTER — Ambulatory Visit: Payer: Medicare HMO | Admitting: Internal Medicine

## 2020-11-21 DIAGNOSIS — E1165 Type 2 diabetes mellitus with hyperglycemia: Secondary | ICD-10-CM | POA: Diagnosis not present

## 2020-11-24 DIAGNOSIS — M542 Cervicalgia: Secondary | ICD-10-CM | POA: Diagnosis not present

## 2020-11-24 DIAGNOSIS — Z79891 Long term (current) use of opiate analgesic: Secondary | ICD-10-CM | POA: Diagnosis not present

## 2020-11-24 DIAGNOSIS — M25561 Pain in right knee: Secondary | ICD-10-CM | POA: Diagnosis not present

## 2020-11-24 DIAGNOSIS — M25562 Pain in left knee: Secondary | ICD-10-CM | POA: Diagnosis not present

## 2020-11-24 DIAGNOSIS — M5451 Vertebrogenic low back pain: Secondary | ICD-10-CM | POA: Diagnosis not present

## 2020-11-25 ENCOUNTER — Other Ambulatory Visit: Payer: Self-pay | Admitting: *Deleted

## 2020-11-25 DIAGNOSIS — Z0100 Encounter for examination of eyes and vision without abnormal findings: Secondary | ICD-10-CM

## 2020-11-25 NOTE — Progress Notes (Signed)
b ref

## 2020-12-17 DIAGNOSIS — M542 Cervicalgia: Secondary | ICD-10-CM | POA: Diagnosis not present

## 2020-12-17 DIAGNOSIS — M25562 Pain in left knee: Secondary | ICD-10-CM | POA: Diagnosis not present

## 2020-12-17 DIAGNOSIS — M5451 Vertebrogenic low back pain: Secondary | ICD-10-CM | POA: Diagnosis not present

## 2020-12-17 DIAGNOSIS — Z79891 Long term (current) use of opiate analgesic: Secondary | ICD-10-CM | POA: Diagnosis not present

## 2020-12-17 DIAGNOSIS — M25561 Pain in right knee: Secondary | ICD-10-CM | POA: Diagnosis not present

## 2020-12-21 DIAGNOSIS — E1165 Type 2 diabetes mellitus with hyperglycemia: Secondary | ICD-10-CM | POA: Diagnosis not present

## 2021-01-05 ENCOUNTER — Other Ambulatory Visit: Payer: Self-pay | Admitting: Family Medicine

## 2021-01-05 DIAGNOSIS — I10 Essential (primary) hypertension: Secondary | ICD-10-CM

## 2021-01-05 DIAGNOSIS — E785 Hyperlipidemia, unspecified: Secondary | ICD-10-CM

## 2021-01-05 DIAGNOSIS — E1140 Type 2 diabetes mellitus with diabetic neuropathy, unspecified: Secondary | ICD-10-CM

## 2021-01-05 DIAGNOSIS — Z794 Long term (current) use of insulin: Secondary | ICD-10-CM

## 2021-01-05 NOTE — Telephone Encounter (Signed)
Patient has transferred care to your office.  

## 2021-01-18 DIAGNOSIS — E118 Type 2 diabetes mellitus with unspecified complications: Secondary | ICD-10-CM | POA: Diagnosis not present

## 2021-01-18 DIAGNOSIS — I700 Atherosclerosis of aorta: Secondary | ICD-10-CM | POA: Diagnosis not present

## 2021-01-18 DIAGNOSIS — E785 Hyperlipidemia, unspecified: Secondary | ICD-10-CM | POA: Diagnosis not present

## 2021-01-18 DIAGNOSIS — I10 Essential (primary) hypertension: Secondary | ICD-10-CM | POA: Diagnosis not present

## 2021-01-20 DIAGNOSIS — Z79891 Long term (current) use of opiate analgesic: Secondary | ICD-10-CM | POA: Diagnosis not present

## 2021-01-20 DIAGNOSIS — M542 Cervicalgia: Secondary | ICD-10-CM | POA: Diagnosis not present

## 2021-01-20 DIAGNOSIS — M5451 Vertebrogenic low back pain: Secondary | ICD-10-CM | POA: Diagnosis not present

## 2021-01-20 DIAGNOSIS — M25562 Pain in left knee: Secondary | ICD-10-CM | POA: Diagnosis not present

## 2021-01-20 DIAGNOSIS — M25561 Pain in right knee: Secondary | ICD-10-CM | POA: Diagnosis not present

## 2021-01-21 DIAGNOSIS — E1165 Type 2 diabetes mellitus with hyperglycemia: Secondary | ICD-10-CM | POA: Diagnosis not present

## 2021-02-17 DIAGNOSIS — M25562 Pain in left knee: Secondary | ICD-10-CM | POA: Diagnosis not present

## 2021-02-17 DIAGNOSIS — Z79891 Long term (current) use of opiate analgesic: Secondary | ICD-10-CM | POA: Diagnosis not present

## 2021-02-17 DIAGNOSIS — M5451 Vertebrogenic low back pain: Secondary | ICD-10-CM | POA: Diagnosis not present

## 2021-02-17 DIAGNOSIS — M545 Low back pain, unspecified: Secondary | ICD-10-CM | POA: Diagnosis not present

## 2021-02-17 DIAGNOSIS — M25561 Pain in right knee: Secondary | ICD-10-CM | POA: Diagnosis not present

## 2021-02-17 DIAGNOSIS — M542 Cervicalgia: Secondary | ICD-10-CM | POA: Diagnosis not present

## 2021-02-20 DIAGNOSIS — E1165 Type 2 diabetes mellitus with hyperglycemia: Secondary | ICD-10-CM | POA: Diagnosis not present

## 2021-02-28 ENCOUNTER — Encounter: Payer: Medicare HMO | Admitting: Internal Medicine

## 2021-03-02 ENCOUNTER — Ambulatory Visit (INDEPENDENT_AMBULATORY_CARE_PROVIDER_SITE_OTHER): Payer: Medicare HMO | Admitting: *Deleted

## 2021-03-02 ENCOUNTER — Other Ambulatory Visit: Payer: Self-pay | Admitting: Nurse Practitioner

## 2021-03-02 ENCOUNTER — Other Ambulatory Visit: Payer: Self-pay

## 2021-03-02 DIAGNOSIS — Z122 Encounter for screening for malignant neoplasm of respiratory organs: Secondary | ICD-10-CM | POA: Diagnosis not present

## 2021-03-02 DIAGNOSIS — Z0000 Encounter for general adult medical examination without abnormal findings: Secondary | ICD-10-CM

## 2021-03-02 NOTE — Progress Notes (Signed)
Subjective:   Stephanie Hammond is a 70 y.o. female who presents for Medicare Annual (Subsequent) preventive examination.  Review of Systems           Objective:    There were no vitals filed for this visit. There is no height or weight on file to calculate BMI.  Advanced Directives 04/25/2019 01/15/2019 01/09/2019 01/02/2019 01/24/2017 07/24/2016 07/24/2016  Does Patient Have a Medical Advance Directive? No No No No No No No  Would patient like information on creating a medical advance directive? No - Patient declined - No - Patient declined No - Patient declined - - -  Pre-existing out of facility DNR order (yellow form or pink MOST form) - - - - - - -    Current Medications (verified) Outpatient Encounter Medications as of 03/02/2021  Medication Sig   amLODipine (NORVASC) 10 MG tablet TAKE 1 TABLET BY MOUTH EVERY DAY   atorvastatin (LIPITOR) 20 MG tablet TAKE 1 TABLET BY MOUTH EVERY DAY   allopurinol (ZYLOPRIM) 300 MG tablet TAKE 1 TABLET BY MOUTH EVERY DAY   Blood Glucose Monitoring Suppl (BLOOD GLUCOSE SYSTEM PAK) KIT Please dispense based on patient and insurance preference. Use as directed to monitor FSBS 3x daily. Dx: E11.9   Glucose Blood (BLOOD GLUCOSE TEST STRIPS) STRP Please dispense based on patient and insurance preference. Use as directed to monitor FSBS 3x daily. Dx: E11.9   Insulin Glargine (BASAGLAR KWIKPEN) 100 UNIT/ML Inject 25 Units into the skin at bedtime.   Insulin Pen Needle (B-D UF III MINI PEN NEEDLES) 31G X 5 MM MISC USE AS DIRECTED TO INJECT INSULIN FOUR TIMES DAILY   Lancets MISC Please dispense based on patient and insurance preference. Use as directed to monitor FSBS 3x daily. Dx: E11.9   losartan (COZAAR) 100 MG tablet Take 1 tablet (100 mg total) by mouth daily.   meloxicam (MOBIC) 15 MG tablet Take 1 tablet (15 mg total) by mouth daily.   metFORMIN (GLUCOPHAGE-XR) 500 MG 24 hr tablet TAKE 2 TABLETS(1000 MG) BY MOUTH DAILY WITH BREAKFAST   naproxen  (NAPROSYN) 500 MG tablet Take 500 mg by mouth 2 (two) times daily.   omeprazole (PRILOSEC) 20 MG capsule TAKE 1 CAPSULE BY MOUTH EVERY DAY IN THE MORNING   oxyCODONE-acetaminophen (PERCOCET) 7.5-325 MG tablet Take 1 tablet by mouth every 6 (six) hours as needed for severe pain.   No facility-administered encounter medications on file as of 03/02/2021.    Allergies (verified) Ace inhibitors, Bentyl [dicyclomine hcl], and Penicillins   History: Past Medical History:  Diagnosis Date   Anemia    Blood transfusion 2009   after GI bleed   CAD (coronary artery disease)    Diabetes mellitus    2   Diverticulitis    Diverticulosis    Fear of local anesthetic    01-02-2019, patient states with last surgery in 2016 , she refused to have epidural before she was sedated d/t to extreme aversion to needles near her back     GERD (gastroesophageal reflux disease)    Gout    Headache    occ if sugar is  too hi or low    Heart murmur    "i was told this years and years and years ago "   History of GI diverticular bleed 08/2004   Hypertension    IBS (irritable bowel syndrome)    Osteoarthritis    Tachycardia    "it stays like that for years between 119 and  121 and it doesnt bother me  none "    Past Surgical History:  Procedure Laterality Date   CHOLECYSTECTOMY  2003   COLON SURGERY     COLONOSCOPY  2000   Dr. Fuller Plan: marked diverticulosis, difficult procedure due to adhesions, polyps benign   COLONOSCOPY  2006   Dr. Henrene Pastor: severe pandiverticulosis   COLONOSCOPY N/A 02/03/2013   SLF: 4 COLORECTAL POLYPS REMOVED/Moderate diverticulosis throughout the entire examined colon/Small internal hemorrhoids   COLONOSCOPY WITH PROPOFOL N/A 04/29/2019   Procedure: COLONOSCOPY WITH PROPOFOL;  Surgeon: Danie Binder, MD;  Location: [facility];  Service: Endoscopy;  Laterality: N/A;  8:30am   JOINT REPLACEMENT Right 2009   Knee Replacement Right    POLYPECTOMY  04/29/2019   Procedure: POLYPECTOMY;   Surgeon: Danie Binder, MD;  Location: [facility];  Service: Endoscopy;;  colon   PTCA     over 10 years ago , unsure of exact year , does recall that no stents were placed    REPLACEMENT TOTAL KNEE     right    TOTAL KNEE ARTHROPLASTY Left 03/12/2015   Procedure: LEFT TOTAL KNEE REPLACEMENT;  Surgeon: Carole Civil, MD;  Location: [facility];  Service: Orthopedics;  Laterality: Left;   TOTAL KNEE REVISION Left 01/09/2019   Procedure: TOTAL KNEE REVISION;  Surgeon: Rod Can, MD;  Location: [facility];  Service: Orthopedics;  Laterality: Left;   VAGINAL HYSTERECTOMY     Family History  Problem Relation Age of Onset   Colon polyps Sister    Cancer Mother        lung    Diabetes Mother    Cancer Father        stomach    Stomach cancer Father        questionable   Colon cancer Neg Hx    Social History   Socioeconomic History   Marital status: Married    Spouse name: Not on file   Number of children: 2   Years of education: Not on file   Highest education level: Not on file  Occupational History   Not on file  Tobacco Use   Smoking status: Every Day    Packs/day: 1.00    Years: 35.00    Pack years: 35.00    Types: Cigarettes   Smokeless tobacco: Never  Vaping Use   Vaping Use: Never used  Substance and Sexual Activity   Alcohol use: No   Drug use: No   Sexual activity: Not Currently    Birth control/protection: Surgical    Comment: hyst  Other Topics Concern   Not on file  Social History Narrative   Not on file   Social Determinants of Health   Financial Resource Strain: Not on file  Food Insecurity: Not on file  Transportation Needs: Not on file  Physical Activity: Not on file  Stress: Not on file  Social Connections: Not on file    Tobacco Counseling Ready to quit: Not Answered Counseling given: Not Answered   Clinical Intake:                 Diabetic?Nutrition Risk Assessment:  Has the patient had any N/V/D within the last 2 months?   No  Does the patient have any non-healing wounds?  No  Has the patient had any unintentional weight loss or weight gain?  No   Diabetes:  Is the patient diabetic?  Yes  If diabetic, was a CBG obtained today?  No  Did the patient bring in their glucometer from home?  No  How often do you monitor your CBG's? Everyday has freestyle libre.   Financial Strains and Diabetes Management:  Are you having any financial strains with the device, your supplies or your medication? No .  Does the patient want to be seen by Chronic Care Management for management of their diabetes?  No  Would the patient like to be referred to a Nutritionist or for Diabetic Management?  No   Diabetic Exams:  Diabetic Eye Exam: Completed 11-19-19. Overdue for diabetic eye exam. Pt has been advised about the importance in completing this exam. A referral has been placed today. Message sent to referral coordinator for scheduling purposes. Advised pt to expect a call from office referred to regarding appt.  Diabetic Foot Exam: Completed 10-04-20. Pt has been advised about the importance in completing this exam. Pt is scheduled for diabetic foot exam on 10-04-21.           Activities of Daily Living In your present state of health, do you have any difficulty performing the following activities: 10/25/2020  Hearing? N  Vision? N  Difficulty concentrating or making decisions? N  Walking or climbing stairs? Y  Dressing or bathing? N  Doing errands, shopping? N  Some recent data might be hidden    Patient Care Team: Lindell Spar, MD as PCP - General (Internal Medicine) Danie Binder, MD (Inactive) as Consulting Physician (Gastroenterology)  Indicate any recent Medical Services you may have received from other than [facility] providers in the past year (date may be approximate).     Assessment:   This is a routine wellness examination for Stephanie Hammond.  Hearing/Vision screen No results found.  Dietary issues and  exercise activities discussed:     Goals Addressed   None   Depression Screen PHQ 2/9 Scores 10/25/2020 09/13/2018 12/28/2017 09/10/2017 06/19/2017 01/05/2017 07/24/2016  PHQ - 2 Score 0 4 0 1 1 0 2  PHQ- 9 Score 0 6 - 7 10 0 7    Fall Risk Fall Risk  10/25/2020 11/20/2018 11/20/2018 09/13/2018 12/28/2017  Falls in the past year? 0 - 1 0 No  Number falls in past yr: 0 - 1 - -  Injury with Fall? 0 (No Data) 0 - -  Comment - no injury - - -  Risk for fall due to : No Fall Risks - - - -  Follow up Falls evaluation completed - - Falls evaluation completed -    FALL RISK PREVENTION PERTAINING TO THE HOME:  Any stairs in or around the home? No  If so, are there any without handrails? No  Home free of loose throw rugs in walkways, pet beds, electrical cords, etc? Yes  Adequate lighting in your home to reduce risk of falls? Yes   ASSISTIVE DEVICES UTILIZED TO PREVENT FALLS:  Life alert? No  Use of a cane, walker or w/c? No  Grab bars in the bathroom? No  Shower chair or bench in shower? Yes  Elevated toilet seat or a handicapped toilet? Yes   TIMED UP AND GO:  Was the test performed? No .  Length of time to ambulate 10 feet: NA sec.     Cognitive Function:        Immunizations Immunization History  Administered Date(s) Administered   [employer] Vaccination 10/01/2019, 10/29/2019, 07/22/2020   PPD Test 09/03/2017   Zoster Recombinat (Shingrix) 11/07/2017, 11/07/2017    TDAP status: Up to date  Flu Vaccine status: Declined, Education has been provided regarding the importance of this vaccine but patient still declined. Advised may receive this vaccine at local pharmacy or Health Dept. Aware to provide a copy of the vaccination record if obtained from local pharmacy or Health Dept. Verbalized acceptance and understanding.  Pneumococcal vaccine status: Declined,  Education has been provided regarding the importance of this vaccine but patient still declined. Advised may  receive this vaccine at local pharmacy or Health Dept. Aware to provide a copy of the vaccination record if obtained from local pharmacy or Health Dept. Verbalized acceptance and understanding.   Covid-19 vaccine status: Completed vaccines  Qualifies for Shingles Vaccine? Yes   Zostavax completed Yes   Shingrix Completed?: Yes  Screening Tests Health Maintenance  Topic Date Due   Zoster Vaccines- Shingrix (2 of 2) 01/02/2018   OPHTHALMOLOGY EXAM  11/18/2020   COVID-19 Vaccine (4 - Booster for Moderna series) 11/20/2020   TETANUS/TDAP  10/04/2021 (Originally 02/19/1970)   PNA vac Low Risk Adult (1 of 2 - PCV13) 10/04/2021 (Originally 02/18/2018)   HEMOGLOBIN A1C  04/03/2021   FOOT EXAM  10/04/2021   MAMMOGRAM  09/27/2022   COLONOSCOPY (Pts 45-49yr Insurance coverage will need to be confirmed)  04/28/2029   DEXA SCAN  Completed   Hepatitis C Screening  Completed   HPV VACCINES  Aged Out   INFLUENZA VACCINE  Discontinued    Health Maintenance  Health Maintenance Due  Topic Date Due   Zoster Vaccines- Shingrix (2 of 2) 01/02/2018   OPHTHALMOLOGY EXAM  11/18/2020   COVID-19 Vaccine (4 - Booster for Moderna series) 11/20/2020    Colorectal cancer screening: Type of screening: Colonoscopy. Completed 04-29-19. Repeat every 04-28-29 years  Mammogram status: Completed 09-27-20. Repeat every year  Bone Density status: Completed 08-23-17. Results reflect: Bone density results: NORMAL. Repeat every 5 years.  Lung Cancer Screening: (Low Dose CT Chest recommended if Age 768-80years, 30 pack-year currently smoking OR have quit w/in 15years.) does qualify.   Lung Cancer Screening Referral: Yes  Additional Screening:  Hepatitis C Screening: does qualify; Completed 06-19-17  Vision Screening: Recommended annual ophthalmology exams for early detection of glaucoma and other disorders of the eye. Is the patient up to date with their annual eye exam?  No Who is the provider or what is the name  of the office in which the patient attends annual eye exams? [facility] [place] If pt is not established with a provider, would they like to be referred to a provider to establish care? No .   Dental Screening: Recommended annual dental exams for proper oral hygiene  Community Resource Referral / Chronic Care Management: CRR required this visit?  No   CCM required this visit?  No      Plan:     I have personally reviewed and noted the following in the patient's chart:   Medical and social history Use of alcohol, tobacco or illicit drugs  Current medications and supplements including opioid prescriptions.  Functional ability and status Nutritional status Physical activity Advanced directives List of other physicians Hospitalizations, surgeries, and ER visits in previous 12 months Vitals Screenings to include cognitive, depression, and falls Referrals and appointments  In addition, I have reviewed and discussed with patient certain preventive protocols, quality metrics, and best practice recommendations. A written personalized care plan for preventive services as well as general preventive health recommendations were provided to patient.     SShelda Altes CMA  03/02/2021   Nurse Notes: Telehealth , Spent 40 mins talking to patient

## 2021-03-02 NOTE — Patient Instructions (Signed)
Stephanie Hammond , Thank you for taking time to come for your Medicare Wellness Visit. I appreciate your ongoing commitment to your health goals. Please review the following plan we discussed and let me know if I can assist you in the future.   Screening recommendations/referrals: Colonoscopy: Completed Due 04-28-29 Mammogram: Completed Due 09-27-21 Bone Density: Completed Due 08-13-22 Recommended yearly ophthalmology/optometry visit for glaucoma screening and checkup Recommended yearly dental visit for hygiene and checkup  Vaccinations: Influenza vaccine: Due 03-07-21 Pneumococcal vaccine: Due now  Tdap vaccine: Due now  Shingles vaccine: Completed    Advanced directives: Information provided  Conditions/risks identified: Hypertension, Diabetes  Next appointment: 1 year    Preventive Care 68 Years and Older, Female Preventive care refers to lifestyle choices and visits with your health care provider that can promote health and wellness. What does preventive care include? A yearly physical exam. This is also called an annual well check. Dental exams once or twice a year. Routine eye exams. Ask your health care provider how often you should have your eyes checked. Personal lifestyle choices, including: Daily care of your teeth and gums. Regular physical activity. Eating a healthy diet. Avoiding tobacco and drug use. Limiting alcohol use. Practicing safe sex. Taking low-dose aspirin every day. Taking vitamin and mineral supplements as recommended by your health care provider. What happens during an annual well check? The services and screenings done by your health care provider during your annual well check will depend on your age, overall health, lifestyle risk factors, and family history of disease. Counseling  Your health care provider may ask you questions about your: Alcohol use. Tobacco use. Drug use. Emotional well-being. Home and relationship well-being. Sexual  activity. Eating habits. History of falls. Memory and ability to understand (cognition). Work and work Statistician. Reproductive health. Screening  You may have the following tests or measurements: Height, weight, and BMI. Blood pressure. Lipid and cholesterol levels. These may be checked every 5 years, or more frequently if you are over 107 years old. Skin check. Lung cancer screening. You may have this screening every year starting at age 38 if you have a 30-pack-year history of smoking and currently smoke or have quit within the past 15 years. Fecal occult blood test (FOBT) of the stool. You may have this test every year starting at age 40. Flexible sigmoidoscopy or colonoscopy. You may have a sigmoidoscopy every 5 years or a colonoscopy every 10 years starting at age 61. Hepatitis C blood test. Hepatitis B blood test. Sexually transmitted disease (STD) testing. Diabetes screening. This is done by checking your blood sugar (glucose) after you have not eaten for a while (fasting). You may have this done every 1-3 years. Bone density scan. This is done to screen for osteoporosis. You may have this done starting at age 56. Mammogram. This may be done every 1-2 years. Talk to your health care provider about how often you should have regular mammograms. Talk with your health care provider about your test results, treatment options, and if necessary, the need for more tests. Vaccines  Your health care provider may recommend certain vaccines, such as: Influenza vaccine. This is recommended every year. Tetanus, diphtheria, and acellular pertussis (Tdap, Td) vaccine. You may need a Td booster every 10 years. Zoster vaccine. You may need this after age 2. Pneumococcal 13-valent conjugate (PCV13) vaccine. One dose is recommended after age 73. Pneumococcal polysaccharide (PPSV23) vaccine. One dose is recommended after age 55. Talk to your health care provider about which  screenings and vaccines  you need and how often you need them. This information is not intended to replace advice given to you by your health care provider. Make sure you discuss any questions you have with your health care provider. Document Released: 08/20/2015 Document Revised: 04/12/2016 Document Reviewed: 05/25/2015 Elsevier Interactive Patient Education  2017 [place] Prevention in the Home Falls can cause injuries. They can happen to people of all ages. There are many things you can do to make your home safe and to help prevent falls. What can I do on the outside of my home? Regularly fix the edges of walkways and driveways and fix any cracks. Remove anything that might make you trip as you walk through a door, such as a raised step or threshold. Trim any bushes or trees on the path to your home. Use bright outdoor lighting. Clear any walking paths of anything that might make someone trip, such as rocks or tools. Regularly check to see if handrails are loose or broken. Make sure that both sides of any steps have handrails. Any raised decks and porches should have guardrails on the edges. Have any leaves, snow, or ice cleared regularly. Use sand or salt on walking paths during winter. Clean up any spills in your garage right away. This includes oil or grease spills. What can I do in the bathroom? Use night lights. Install grab bars by the toilet and in the tub and shower. Do not use towel bars as grab bars. Use non-skid mats or decals in the tub or shower. If you need to sit down in the shower, use a plastic, non-slip stool. Keep the floor dry. Clean up any water that spills on the floor as soon as it happens. Remove soap buildup in the tub or shower regularly. Attach bath mats securely with double-sided non-slip rug tape. Do not have throw rugs and other things on the floor that can make you trip. What can I do in the bedroom? Use night lights. Make sure that you have a light by your bed that  is easy to reach. Do not use any sheets or blankets that are too big for your bed. They should not hang down onto the floor. Have a firm chair that has side arms. You can use this for support while you get dressed. Do not have throw rugs and other things on the floor that can make you trip. What can I do in the kitchen? Clean up any spills right away. Avoid walking on wet floors. Keep items that you use a lot in easy-to-reach places. If you need to reach something above you, use a strong step stool that has a grab bar. Keep electrical cords out of the way. Do not use floor polish or wax that makes floors slippery. If you must use wax, use non-skid floor wax. Do not have throw rugs and other things on the floor that can make you trip. What can I do with my stairs? Do not leave any items on the stairs. Make sure that there are handrails on both sides of the stairs and use them. Fix handrails that are broken or loose. Make sure that handrails are as long as the stairways. Check any carpeting to make sure that it is firmly attached to the stairs. Fix any carpet that is loose or worn. Avoid having throw rugs at the top or bottom of the stairs. If you do have throw rugs, attach them to the floor with carpet  tape. Make sure that you have a light switch at the top of the stairs and the bottom of the stairs. If you do not have them, ask someone to add them for you. What else can I do to help prevent falls? Wear shoes that: Do not have high heels. Have rubber bottoms. Are comfortable and fit you well. Are closed at the toe. Do not wear sandals. If you use a stepladder: Make sure that it is fully opened. Do not climb a closed stepladder. Make sure that both sides of the stepladder are locked into place. Ask someone to hold it for you, if possible. Clearly mark and make sure that you can see: Any grab bars or handrails. First and last steps. Where the edge of each step is. Use tools that help you  move around (mobility aids) if they are needed. These include: Canes. Walkers. Scooters. Crutches. Turn on the lights when you go into a dark area. Replace any light bulbs as soon as they burn out. Set up your furniture so you have a clear path. Avoid moving your furniture around. If any of your floors are uneven, fix them. If there are any pets around you, be aware of where they are. Review your medicines with your doctor. Some medicines can make you feel dizzy. This can increase your chance of falling. Ask your doctor what other things that you can do to help prevent falls. This information is not intended to replace advice given to you by your health care provider. Make sure you discuss any questions you have with your health care provider. Document Released: 05/20/2009 Document Revised: 12/30/2015 Document Reviewed: 08/28/2014 Elsevier Interactive Patient Education  2017 [employer].

## 2021-03-02 NOTE — Addendum Note (Signed)
Addended by: Zacarias Pontes R on: 03/02/2021 09:04 AM   Modules accepted: Orders

## 2021-03-07 ENCOUNTER — Other Ambulatory Visit: Payer: Self-pay | Admitting: Internal Medicine

## 2021-03-07 DIAGNOSIS — I10 Essential (primary) hypertension: Secondary | ICD-10-CM

## 2021-03-21 NOTE — Progress Notes (Signed)
I connected with  Stephanie Hammond on 03/21/21 by an audo enabled telemedicine application and verified that I am speaking with the correct person using two identifiers.   I discussed the limitations, risks, security and privacy concerns of performing an evaluation and management service by telephone and the availability of in person appointments. I also discussed with the patient that there may be a patient responsible charge related to this service. The patient expressed understanding and verbally consented to this telephonic visit.  The patient was at home. The provider was Porfirio Mylar NP who was in the office. This was a telehealth visit.

## 2021-03-22 ENCOUNTER — Encounter: Payer: Self-pay | Admitting: Internal Medicine

## 2021-03-22 ENCOUNTER — Ambulatory Visit (INDEPENDENT_AMBULATORY_CARE_PROVIDER_SITE_OTHER): Payer: Medicare HMO | Admitting: Internal Medicine

## 2021-03-22 ENCOUNTER — Other Ambulatory Visit: Payer: Self-pay | Admitting: *Deleted

## 2021-03-22 ENCOUNTER — Telehealth: Payer: Self-pay

## 2021-03-22 ENCOUNTER — Other Ambulatory Visit: Payer: Self-pay

## 2021-03-22 VITALS — BP 125/74 | HR 91 | Temp 97.7°F | Resp 18 | Ht 63.0 in | Wt 208.1 lb

## 2021-03-22 DIAGNOSIS — Z794 Long term (current) use of insulin: Secondary | ICD-10-CM

## 2021-03-22 DIAGNOSIS — I10 Essential (primary) hypertension: Secondary | ICD-10-CM | POA: Diagnosis not present

## 2021-03-22 DIAGNOSIS — H524 Presbyopia: Secondary | ICD-10-CM | POA: Diagnosis not present

## 2021-03-22 DIAGNOSIS — E1140 Type 2 diabetes mellitus with diabetic neuropathy, unspecified: Secondary | ICD-10-CM

## 2021-03-22 DIAGNOSIS — Z0100 Encounter for examination of eyes and vision without abnormal findings: Secondary | ICD-10-CM | POA: Diagnosis not present

## 2021-03-22 DIAGNOSIS — Z0001 Encounter for general adult medical examination with abnormal findings: Secondary | ICD-10-CM | POA: Insufficient documentation

## 2021-03-22 MED ORDER — DEXCOM G5 RECEIVER KIT DEVI: 1.0000 | Freq: Four times a day (QID) | 0 refills | Status: DC

## 2021-03-22 MED ORDER — DEXCOM G5 RECEIVER KIT DEVI
1.0000 | Freq: Four times a day (QID) | 0 refills | Status: DC
Start: 2021-03-22 — End: 2021-05-04

## 2021-03-22 NOTE — Assessment & Plan Note (Addendum)
On Basaglar 28 U qHS and Novolog 5 U PRN for blood glucose > 250 On Ozempic 0.5 mg On Metformin 500 mg QD Had freestyle Libre, but did not work properly and was having gross discrepancy between it and fingerstick. Will try Dexcom. Follows up with Endocrinologist in [place]. On ARB and statin

## 2021-03-22 NOTE — Assessment & Plan Note (Signed)
Annual exam as documented. Counseling done  re healthy lifestyle involving commitment to 150 minutes exercise per week, heart healthy diet, and attaining healthy weight.The importance of adequate sleep also discussed. Changes in health habits are decided on by the patient with goals and time frames  set for achieving them. Immunization and cancer screening needs are specifically addressed at this visit. 

## 2021-03-22 NOTE — Progress Notes (Signed)
Established Patient Office Visit  Subjective:  Patient ID: Stephanie Hammond, female    DOB: [birth year]  Age: 70 y.o. MRN: [id number]  CC:  Chief Complaint  Patient presents with   Annual Exam    Annual exam pt would like to discuss switching from freestyle libra to dexcom    HPI Stephanie Hammond is a 70 year old female with PMH of HTN, HLD, type 2 DM, diverticulitis, s/p partial colectomy, chronic pain syndrome, gout, obesity and tobacco abuse who presents for annual physical.  BP is well-controlled. Takes medications regularly. Patient denies headache, dizziness, chest pain, dyspnea or palpitations.  She has a history of type II DM, for which she takes Basaglar 28 units at nighttime now.  She also takes NovoLog 5 units as needed for blood glucose more than 250.  She is also on Metformin.  She follows up with endocrinologist in [place].  She had a continuous blood glucose monitor - freestyle libre, but states that she was having gross discrepancy between it and fingerstick. She asks for Dexcom.  She also takes Lipitor.  She has had 1 dose of Shingrix vaccine in the past. She advised to ask about and get Shingrix vaccine at the Pharmacy.  Past Medical History:  Diagnosis Date   Anemia    Blood transfusion 2009   after GI bleed   CAD (coronary artery disease)    Diabetes mellitus    2   Diverticulitis    Diverticulosis    Fear of local anesthetic    01-02-2019, patient states with last surgery in 2016 , she refused to have epidural before she was sedated d/t to extreme aversion to needles near her back     GERD (gastroesophageal reflux disease)    Gout    Headache    occ if sugar is  too hi or low    Heart murmur    "i was told this years and years and years ago "   History of GI diverticular bleed 08/2004   Hypertension    IBS (irritable bowel syndrome)    Osteoarthritis    Tachycardia    "it stays like that for years between 119 and 121 and it doesnt bother me  none  "     Past Surgical History:  Procedure Laterality Date   CHOLECYSTECTOMY  2003   COLON SURGERY     COLONOSCOPY  2000   Dr. Russella Dar: marked diverticulosis, difficult procedure due to adhesions, polyps benign   COLONOSCOPY  2006   Dr. Marina Goodell: severe pandiverticulosis   COLONOSCOPY N/A 02/03/2013   SLF: 4 COLORECTAL POLYPS REMOVED/Moderate diverticulosis throughout the entire examined colon/Small internal hemorrhoids   COLONOSCOPY WITH PROPOFOL N/A 04/29/2019   Procedure: COLONOSCOPY WITH PROPOFOL;  Surgeon: West Bali, MD;  Location: [facility];  Service: Endoscopy;  Laterality: N/A;  8:30am   JOINT REPLACEMENT Right 2009   Knee Replacement Right    POLYPECTOMY  04/29/2019   Procedure: POLYPECTOMY;  Surgeon: West Bali, MD;  Location: [facility];  Service: Endoscopy;;  colon   PTCA     over 10 years ago , unsure of exact year , does recall that no stents were placed    REPLACEMENT TOTAL KNEE     right    TOTAL KNEE ARTHROPLASTY Left 03/12/2015   Procedure: LEFT TOTAL KNEE REPLACEMENT;  Surgeon: Vickki Hearing, MD;  Location: [facility];  Service: Orthopedics;  Laterality: Left;   TOTAL KNEE REVISION Left 01/09/2019  Procedure: TOTAL KNEE REVISION;  Surgeon: Samson Frederic, MD;  Location: [facility];  Service: Orthopedics;  Laterality: Left;   VAGINAL HYSTERECTOMY      Family History  Problem Relation Age of Onset   Colon polyps Sister    Cancer Mother        lung    Diabetes Mother    Cancer Father        stomach    Stomach cancer Father        questionable   Colon cancer Neg Hx     Social History   Socioeconomic History   Marital status: Married    Spouse name: Not on file   Number of children: 2   Years of education: Not on file   Highest education level: Not on file  Occupational History   Not on file  Tobacco Use   Smoking status: Every Day    Packs/day: 1.00    Years: 35.00    Pack years: 35.00    Types: Cigarettes   Smokeless tobacco: Never  Vaping  Use   Vaping Use: Never used  Substance and Sexual Activity   Alcohol use: No   Drug use: No   Sexual activity: Not Currently    Birth control/protection: Surgical    Comment: hyst  Other Topics Concern   Not on file  Social History Narrative   Husband passed away lives alone    Social Determinants of Health   Financial Resource Strain: Low Risk    Difficulty of Paying Living Expenses: Not hard at all  Food Insecurity: No Food Insecurity   Worried About Programme researcher, broadcasting/film/video in the Last Year: Never true   Ran Out of Food in the Last Year: Never true  Transportation Needs: No Transportation Needs   Lack of Transportation (Medical): No   Lack of Transportation (Non-Medical): No  Physical Activity: Inactive   Days of Exercise per Week: 0 days   Minutes of Exercise per Session: 0 min  Stress: No Stress Concern Present   Feeling of Stress : Not at all  Social Connections: Moderately Isolated   Frequency of Communication with Friends and Family: More than three times a week   Frequency of Social Gatherings with Friends and Family: More than three times a week   Attends Religious Services: More than 4 times per year   Active Member of [employer] or Organizations: No   Attends Banker Meetings: Never   Marital Status: Widowed  Catering manager Violence: Not At Risk   Fear of Current or Ex-Partner: No   Emotionally Abused: No   Physically Abused: No   Sexually Abused: No    Outpatient Medications Prior to Visit  Medication Sig Dispense Refill   allopurinol (ZYLOPRIM) 300 MG tablet TAKE 1 TABLET BY MOUTH EVERY DAY 90 tablet 1   amLODipine (NORVASC) 10 MG tablet TAKE 1 TABLET BY MOUTH EVERY DAY 90 tablet 1   atorvastatin (LIPITOR) 20 MG tablet TAKE 1 TABLET BY MOUTH EVERY DAY 90 tablet 1   Blood Glucose Monitoring Suppl (BLOOD GLUCOSE SYSTEM PAK) KIT Please dispense based on patient and insurance preference. Use as directed to monitor FSBS 3x daily. Dx: E11.9 1 kit 1    Glucose Blood (BLOOD GLUCOSE TEST STRIPS) STRP Please dispense based on patient and insurance preference. Use as directed to monitor FSBS 3x daily. Dx: E11.9 100 strip 11   Insulin Glargine (BASAGLAR KWIKPEN) 100 UNIT/ML Inject 25 Units into the skin at bedtime. 15  mL 3   Insulin Pen Needle (B-D UF III MINI PEN NEEDLES) 31G X 5 MM MISC USE AS DIRECTED TO INJECT INSULIN FOUR TIMES DAILY 100 each 3   Lancets MISC Please dispense based on patient and insurance preference. Use as directed to monitor FSBS 3x daily. Dx: E11.9 100 each 11   losartan (COZAAR) 100 MG tablet TAKE ONE TABLET BY MOUTH ONCE DAILY 90 tablet 1   meloxicam (MOBIC) 15 MG tablet Take 1 tablet (15 mg total) by mouth daily. 30 tablet 0   metFORMIN (GLUCOPHAGE-XR) 500 MG 24 hr tablet TAKE 2 TABLETS(1000 MG) BY MOUTH DAILY WITH BREAKFAST 180 tablet 2   naproxen (NAPROSYN) 500 MG tablet Take 500 mg by mouth 2 (two) times daily.     omeprazole (PRILOSEC) 20 MG capsule TAKE 1 CAPSULE BY MOUTH EVERY DAY IN THE MORNING 90 capsule 3   oxyCODONE-acetaminophen (PERCOCET) 7.5-325 MG tablet Take 1 tablet by mouth every 6 (six) hours as needed for severe pain. 80 tablet 0   Semaglutide,0.25 or 0.5MG /DOS, 2 MG/1.5ML SOPN Inject 0.5 mg into the skin every 7 (seven) days.     No facility-administered medications prior to visit.    Allergies  Allergen Reactions   Ace Inhibitors Cough   Bentyl [Dicyclomine Hcl] Other (See Comments)    Blurry vision   Penicillins Itching and Swelling    [place]helps Did it involve swelling of the face/tongue/throat, SOB, or low BP? No Did it involve sudden or severe rash/hives, skin peeling, or any reaction on the inside of your mouth or nose? No Did you need to seek medical attention at a hospital or doctor's office? No When did it last happen?      20 years If all above answers are "NO", may proceed with cephalosporin use.     ROS Review of Systems  Constitutional:  Negative for chills and fever.  HENT:   Negative for congestion, sinus pressure, sinus pain and sore throat.   Eyes:  Negative for pain and discharge.  Respiratory:  Negative for cough and shortness of breath.   Cardiovascular:  Negative for chest pain and palpitations.  Gastrointestinal:  Negative for abdominal pain, constipation, diarrhea, nausea and vomiting.  Endocrine: Negative for polydipsia and polyuria.  Genitourinary:  Negative for dysuria and hematuria.  Musculoskeletal:  Positive for arthralgias and back pain. Negative for neck pain and neck stiffness.  Skin:  Negative for rash.  Neurological:  Negative for dizziness and weakness.  Psychiatric/Behavioral:  Negative for agitation and behavioral problems.      Objective:    Physical Exam Vitals reviewed.  Constitutional:      General: She is not in acute distress.    Appearance: She is not diaphoretic.  HENT:     Head: Normocephalic and atraumatic.     Nose: Nose normal.     Mouth/Throat:     Mouth: Mucous membranes are moist.  Eyes:     General: No scleral icterus.    Extraocular Movements: Extraocular movements intact.  Neck:     Vascular: No carotid bruit.  Cardiovascular:     Rate and Rhythm: Normal rate and regular rhythm.     Pulses: Normal pulses.     Heart sounds: No murmur heard. Pulmonary:     Breath sounds: Normal breath sounds. No wheezing or rales.  Abdominal:     Palpations: Abdomen is soft.     Tenderness: There is no abdominal tenderness.  Musculoskeletal:     Cervical back: Neck supple.  No tenderness.     Right lower leg: No edema.     Left lower leg: No edema.  Skin:    General: Skin is warm.     Findings: No rash.  Neurological:     General: No focal deficit present.     Mental Status: She is alert and oriented to person, place, and time.     Cranial Nerves: No cranial nerve deficit.     Sensory: No sensory deficit.     Motor: No weakness.  Psychiatric:        Mood and Affect: Mood normal.        Behavior: Behavior normal.     BP 125/74 (BP Location: Left Arm, Patient Position: Sitting, Cuff Size: Normal)   Pulse 91   Temp 97.7 F (36.5 C) (Oral)   Resp 18   Ht 5\' 3"  (1.6 m)   Wt 208 lb 1.3 oz (94.4 kg)   SpO2 94%   BMI 36.86 kg/m  Wt Readings from Last 3 Encounters:  03/22/21 208 lb 1.3 oz (94.4 kg)  10/25/20 208 lb 1.9 oz (94.4 kg)  10/04/20 209 lb (94.8 kg)     Health Maintenance Due  Topic Date Due   Zoster Vaccines- Shingrix (2 of 2) 01/02/2018   OPHTHALMOLOGY EXAM  11/18/2020   COVID-19 Vaccine (4 - Booster for Moderna series) 11/20/2020    There are no preventive care reminders to display for this patient.  Lab Results  Component Value Date   TSH 0.75 09/10/2017   Lab Results  Component Value Date   WBC CANCELED 10/04/2020   HGB 10.2 (L) 05/03/2020   HCT 31.4 (L) 05/03/2020   MCV 86.5 05/03/2020   PLT 373 05/03/2020   Lab Results  Component Value Date   NA 139 03/19/2020   K 5.5 (H) 03/19/2020   CO2 22 03/19/2020   GLUCOSE CANCELED 10/04/2020   BUN 23 03/19/2020   CREATININE 0.87 03/19/2020   BILITOT 0.3 03/19/2020   ALKPHOS 131 (H) 12/03/2018   AST 12 03/19/2020   ALT 10 03/19/2020   PROT 6.7 03/19/2020   ALBUMIN 4.2 12/03/2018   CALCIUM 9.5 03/19/2020   ANIONGAP 12 04/25/2019   Lab Results  Component Value Date   CHOL 92 03/19/2020   Lab Results  Component Value Date   HDL 37 (L) 03/19/2020   Lab Results  Component Value Date   LDLCALC CANCELED 10/04/2020   Lab Results  Component Value Date   TRIG 85 03/19/2020   Lab Results  Component Value Date   CHOLHDL 2.5 03/19/2020   Lab Results  Component Value Date   HGBA1C CANCELED 10/04/2020      Assessment & Plan:   Problem List Items Addressed This Visit       Encounter for general adult medical examination with abnormal findings - Primary   Annual exam as documented. Counseling done  re healthy lifestyle involving commitment to 150 minutes exercise per week, heart healthy diet, and attaining  healthy weight.The importance of adequate sleep also discussed. Changes in health habits are decided on by the patient with goals and time frames  set for achieving them. Immunization and cancer screening needs are specifically addressed at this visit.       Cardiovascular and Mediastinum   Essential hypertension, benign    BP Readings from Last 1 Encounters:  03/22/21 125/74  Well-controlled with Amlodipine and Losartan Counseled for compliance with the medications Advised DASH diet and moderate exercise/walking, at least 150  mins/week        Endocrine   Type 2 diabetes mellitus with diabetic neuropathy (HCC)    On Basaglar 28 U qHS and Novolog 5 U PRN for blood glucose > 250 On Ozempic 0.5 mg On Metformin 500 mg QD Had freestyle Libre, but did not work properly and was having gross discrepancy between it and fingerstick. Will try Dexcom. Follows up with Endocrinologist in [place]. On ARB and statin       Relevant Medications   Semaglutide,0.25 or 0.5MG /DOS, 2 MG/1.5ML SOPN   Continuous Blood Gluc Receiver (DEXCOM G5 RECEIVER KIT) DEVI   Other Relevant Orders   AMB Referral to Community Care Coordinaton             Meds ordered this encounter  Medications   Continuous Blood Gluc Receiver (DEXCOM G5 RECEIVER KIT) DEVI    Sig: 1 each by Does not apply route in the morning, at noon, in the evening, and at bedtime.    Dispense:  1 each    Refill:  0    Follow-up: Return in about 4 months (around 07/22/2021) for HTN and DM.    Anabel Halon, MD

## 2021-03-22 NOTE — Telephone Encounter (Signed)
April from [facility] in [place] called to let us know the dexacom receiver isn't covered by Part D only by part B we would need to send it to [facility] or [facility]

## 2021-03-22 NOTE — Assessment & Plan Note (Signed)
BP Readings from Last 1 Encounters:  03/22/21 125/74   Well-controlled with Amlodipine and Losartan Counseled for compliance with the medications Advised DASH diet and moderate exercise/walking, at least 150 mins/week

## 2021-03-22 NOTE — Patient Instructions (Signed)
Health Maintenance, Female Adopting a healthy lifestyle and getting preventive care are important in promoting health and wellness. Ask your health care provider about: The right schedule for you to have regular tests and exams. Things you can do on your own to prevent diseases and keep yourself healthy. What should I know about diet, weight, and exercise? Eat a healthy diet  Eat a diet that includes plenty of vegetables, fruits, low-fat dairy products, and lean protein. Do not eat a lot of foods that are high in solid fats, added sugars, or sodium.  Maintain a healthy weight Body mass index (BMI) is used to identify weight problems. It estimates body fat based on height and weight. Your health care provider can help determineyour BMI and help you achieve or maintain a healthy weight. Get regular exercise Get regular exercise. This is one of the most important things you can do for your health. Most adults should: Exercise for at least 150 minutes each week. The exercise should increase your heart rate and make you sweat (moderate-intensity exercise). Do strengthening exercises at least twice a week. This is in addition to the moderate-intensity exercise. Spend less time sitting. Even light physical activity can be beneficial. Watch cholesterol and blood lipids Have your blood tested for lipids and cholesterol at 68 years of age, then havethis test every 5 years. Have your cholesterol levels checked more often if: Your lipid or cholesterol levels are high. You are older than 68 years of age. You are at high risk for heart disease. What should I know about cancer screening? Depending on your health history and family history, you may need to have cancer screening at various ages. This may include screening for: Breast cancer. Cervical cancer. Colorectal cancer. Skin cancer. Lung cancer. What should I know about heart disease, diabetes, and high blood pressure? Blood pressure and heart  disease High blood pressure causes heart disease and increases the risk of stroke. This is more likely to develop in people who have high blood pressure readings, are of African descent, or are overweight. Have your blood pressure checked: Every 3-5 years if you are 18-39 years of age. Every year if you are 40 years old or older. Diabetes Have regular diabetes screenings. This checks your fasting blood sugar level. Have the screening done: Once every three years after age 40 if you are at a normal weight and have a low risk for diabetes. More often and at a younger age if you are overweight or have a high risk for diabetes. What should I know about preventing infection? Hepatitis B If you have a higher risk for hepatitis B, you should be screened for this virus. Talk with your health care provider to find out if you are at risk forhepatitis B infection. Hepatitis C Testing is recommended for: Everyone born from 1945 through 1965. Anyone with known risk factors for hepatitis C. Sexually transmitted infections (STIs) Get screened for STIs, including gonorrhea and chlamydia, if: You are sexually active and are younger than 68 years of age. You are older than 68 years of age and your health care provider tells you that you are at risk for this type of infection. Your sexual activity has changed since you were last screened, and you are at increased risk for chlamydia or gonorrhea. Ask your health care provider if you are at risk. Ask your health care provider about whether you are at high risk for HIV. Your health care provider may recommend a prescription medicine to help   prevent HIV infection. If you choose to take medicine to prevent HIV, you should first get tested for HIV. You should then be tested every 3 months for as long as you are taking the medicine. Pregnancy If you are about to stop having your period (premenopausal) and you may become pregnant, seek counseling before you get  pregnant. Take 400 to 800 micrograms (mcg) of folic acid every day if you become pregnant. Ask for birth control (contraception) if you want to prevent pregnancy. Osteoporosis and menopause Osteoporosis is a disease in which the bones lose minerals and strength with aging. This can result in bone fractures. If you are 65 years old or older, or if you are at risk for osteoporosis and fractures, ask your health care provider if you should: Be screened for bone loss. Take a calcium or vitamin D supplement to lower your risk of fractures. Be given hormone replacement therapy (HRT) to treat symptoms of menopause. Follow these instructions at home: Lifestyle Do not use any products that contain nicotine or tobacco, such as cigarettes, e-cigarettes, and chewing tobacco. If you need help quitting, ask your health care provider. Do not use street drugs. Do not share needles. Ask your health care provider for help if you need support or information about quitting drugs. Alcohol use Do not drink alcohol if: Your health care provider tells you not to drink. You are pregnant, may be pregnant, or are planning to become pregnant. If you drink alcohol: Limit how much you use to 0-1 drink a day. Limit intake if you are breastfeeding. Be aware of how much alcohol is in your drink. In the [place] one drink equals one 12 oz bottle of beer (355 mL), one 5 oz glass of wine (148 mL), or one 1 oz glass of hard liquor (44 mL). General instructions Schedule regular health, dental, and eye exams. Stay current with your vaccines. Tell your health care provider if: You often feel depressed. You have ever been abused or do not feel safe at home. Summary Adopting a healthy lifestyle and getting preventive care are important in promoting health and wellness. Follow your health care provider's instructions about healthy diet, exercising, and getting tested or screened for diseases. Follow your health care provider's  instructions on monitoring your cholesterol and blood pressure. This information is not intended to replace advice given to you by your health care provider. Make sure you discuss any questions you have with your healthcare provider. Document Revised: 07/17/2018 Document Reviewed: 07/17/2018 Elsevier Patient Education  2022 Elsevier Inc.  

## 2021-03-22 NOTE — Telephone Encounter (Signed)
[facility] [place]

## 2021-03-23 DIAGNOSIS — E1165 Type 2 diabetes mellitus with hyperglycemia: Secondary | ICD-10-CM | POA: Diagnosis not present

## 2021-03-24 ENCOUNTER — Telehealth: Payer: Self-pay | Admitting: *Deleted

## 2021-03-24 NOTE — Chronic Care Management (AMB) (Signed)
Chronic Care Management   Note  03/24/2021 Name: Stephanie Hammond MRN: [id number] DOB: 1950-11-15  Stephanie Hammond is a 70 y.o. year old female who is a primary care patient of Anabel Halon, MD. I reached out to Stephanie Hammond by phone today in response to a referral sent by Stephanie Hammond's PCP, Dr. Allena Katz.      Stephanie Hammond was given information about Chronic Care Management services today including:  CCM service includes personalized support from designated clinical staff supervised by her physician, including individualized plan of care and coordination with other care providers 24/7 contact phone numbers for assistance for urgent and routine care needs. Service will only be billed when office clinical staff spend 20 minutes or more in a month to coordinate care. Only one practitioner may furnish and bill the service in a calendar month. The patient may stop CCM services at any time (effective at the end of the month) by phone call to the office staff. The patient will be responsible for cost sharing (co-pay) of up to 20% of the service fee (after annual deductible is met).  Patient agreed to services and verbal consent obtained.   Follow up plan: Telephone appointment with care management team member scheduled for:03/31/21  [facility] Guide, Embedded Care Coordination [facility]  Care Management  Direct Dial: [phone]

## 2021-03-28 ENCOUNTER — Telehealth: Payer: Self-pay

## 2021-03-28 NOTE — Telephone Encounter (Signed)
[facility] - Per Ins [facility]  Please re-call this to [facility] said pharmacy also sent request.

## 2021-03-30 ENCOUNTER — Telehealth: Payer: Self-pay

## 2021-03-30 ENCOUNTER — Other Ambulatory Visit: Payer: Self-pay | Admitting: Internal Medicine

## 2021-03-30 DIAGNOSIS — Z794 Long term (current) use of insulin: Secondary | ICD-10-CM

## 2021-03-30 DIAGNOSIS — E1140 Type 2 diabetes mellitus with diabetic neuropathy, unspecified: Secondary | ICD-10-CM

## 2021-03-30 NOTE — Telephone Encounter (Signed)
Patient called said medicine  Continuous Blood Gluc Receiver ([place] KIT) DEVI [[id number] not covered by her insurance.  Can something else be called in that her insurance will cover.

## 2021-03-31 ENCOUNTER — Telehealth: Payer: Medicare HMO

## 2021-03-31 NOTE — Telephone Encounter (Signed)
Left message

## 2021-03-31 NOTE — Telephone Encounter (Signed)
Pt informed

## 2021-04-07 ENCOUNTER — Ambulatory Visit (INDEPENDENT_AMBULATORY_CARE_PROVIDER_SITE_OTHER): Payer: Medicare HMO | Admitting: Pharmacist

## 2021-04-07 DIAGNOSIS — Z794 Long term (current) use of insulin: Secondary | ICD-10-CM

## 2021-04-07 DIAGNOSIS — E785 Hyperlipidemia, unspecified: Secondary | ICD-10-CM

## 2021-04-07 DIAGNOSIS — Z720 Tobacco use: Secondary | ICD-10-CM

## 2021-04-07 DIAGNOSIS — I10 Essential (primary) hypertension: Secondary | ICD-10-CM

## 2021-04-07 DIAGNOSIS — E1140 Type 2 diabetes mellitus with diabetic neuropathy, unspecified: Secondary | ICD-10-CM

## 2021-04-07 DIAGNOSIS — I2510 Atherosclerotic heart disease of native coronary artery without angina pectoris: Secondary | ICD-10-CM

## 2021-04-07 NOTE — Telephone Encounter (Signed)
[facility] 

## 2021-04-07 NOTE — Chronic Care Management (AMB) (Signed)
Chronic Care Management Pharmacy Note  04/07/2021 Name:  Stephanie Hammond MRN:  [id number] DOB:  June 25, 1951  Summary:  Recommend to continue titrating Ozempic. Will need to decrease basal insulin requirements with each dose titration.  Faxed paperwork at 12:16 PM on 04/07/21 for Dexcom G6 through DME since patient has Medicare. Smoking cessation is advised. Will discuss more at future visits.  Subjective: Stephanie Hammond is an 70 y.o. year old female who is a primary patient of Anabel Halon, MD.  The CCM team was consulted for assistance with disease management and care coordination needs.    Engaged with patient by telephone for initial visit in response to provider referral for pharmacy case management and/or care coordination services.   Consent to Services:  The patient was given the following information about Chronic Care Management services today, agreed to services, and gave verbal consent: 1. CCM service includes personalized support from designated clinical staff supervised by the primary care provider, including individualized plan of care and coordination with other care providers 2. 24/7 contact phone numbers for assistance for urgent and routine care needs. 3. Service will only be billed when office clinical staff spend 20 minutes or more in a month to coordinate care. 4. Only one practitioner may furnish and bill the service in a calendar month. 5.The patient may stop CCM services at any time (effective at the end of the month) by phone call to the office staff. 6. The patient will be responsible for cost sharing (co-pay) of up to 20% of the service fee (after annual deductible is met). Patient agreed to services and consent obtained.  Patient Care Team: Anabel Halon, MD as PCP - General (Internal Medicine) [place]ali, MD (Inactive) as Consulting Physician (Gastroenterology) Gavin Pound, [facility] (Pharmacist)  Objective:  Lab Results  Component Value Date    CREATININE 0.87 03/19/2020   CREATININE 0.87 01/21/2020   CREATININE 0.86 11/10/2019    Lab Results  Component Value Date   HGBA1C CANCELED 10/04/2020   Last diabetic Eye exam:  Lab Results  Component Value Date/Time   HMDIABEYEEXA No Retinopathy 11/19/2019 12:00 AM    Last diabetic Foot exam: No results found for: HMDIABFOOTEX      Component Value Date/Time   CHOL 92 03/19/2020 1127   TRIG 85 03/19/2020 1127   HDL 37 (L) 03/19/2020 1127   CHOLHDL 2.5 03/19/2020 1127   VLDL 27 09/11/2016 1255   LDLCALC CANCELED 10/04/2020 1301   LDLDIRECT 68 12/01/2013 1454    Hepatic Function Latest Ref Rng & Units 03/19/2020 05/13/2019 12/03/2018  Total Protein 6.1 - 8.1 g/dL 6.7 7.2 7.0  Albumin 3.8 - 4.8 g/dL - - 4.2  AST 10 - 35 U/L 12 15 13   ALT 6 - 29 U/L 10 14 17   Alk Phosphatase 39 - 117 IU/L - - 131(H)  Total Bilirubin 0.2 - 1.2 mg/dL 0.3 0.3 0.2    Lab Results  Component Value Date/Time   TSH 0.75 09/10/2017 03:01 PM   TSH 1.384 05/27/2012 02:08 PM    CBC Latest Ref Rng & Units 10/04/2020 05/03/2020 03/19/2020  WBC - CANCELED 6.2 7.0  Hemoglobin 11.7 - 15.5 g/dL - 10.2(L) 10.2(L)  Hematocrit 35.0 - 45.0 % - 31.4(L) 31.9(L)  Platelets 140 - 400 Thousand/uL - 373 344    No results found for: VD25OH  Clinical ASCVD: No  The ASCVD Risk score Denman George DC Jr., et al., 2013) failed to calculate for the following reasons:  The valid total cholesterol range is 130 to 320 mg/dL    Social History   Tobacco Use  Smoking Status Every Day   Packs/day: 1.00   Years: 35.00   Pack years: 35.00   Types: Cigarettes  Smokeless Tobacco Never   BP Readings from Last 3 Encounters:  03/22/21 125/74  10/25/20 121/70  10/04/20 138/64   Pulse Readings from Last 3 Encounters:  03/22/21 91  10/25/20 (!) 111  10/04/20 100   Wt Readings from Last 3 Encounters:  03/22/21 208 lb 1.3 oz (94.4 kg)  10/25/20 208 lb 1.9 oz (94.4 kg)  10/04/20 209 lb (94.8 kg)    Assessment: Review of  patient past medical history, allergies, medications, health status, including review of consultants reports, laboratory and other test data, was performed as part of comprehensive evaluation and provision of chronic care management services.   SDOH:  (Social Determinants of Health) assessments and interventions performed:    CCM Care Plan  Allergies  Allergen Reactions   Ace Inhibitors Cough   Bentyl [Dicyclomine Hcl] Other (See Comments)    Blurry vision   Penicillins Itching and Swelling    [place] Did it involve swelling of the face/tongue/throat, SOB, or low BP? No Did it involve sudden or severe rash/hives, skin peeling, or any reaction on the inside of your mouth or nose? No Did you need to seek medical attention at a hospital or doctor's office? No When did it last happen?      20 years If all above answers are "NO", may proceed with cephalosporin use.     Medications Reviewed Today     Reviewed by Gavin Pound, [facility] (Pharmacist) on 04/07/21 at 1124  Med List Status: <None>   Medication Order Taking? Sig Documenting Provider Last Dose Status Informant  allopurinol (ZYLOPRIM) 300 MG tablet [id number] Yes TAKE 1 TABLET BY MOUTH EVERY DAY [place], Velna Hatchet, MD Taking Active   amLODipine (NORVASC) 10 MG tablet [id number] Yes TAKE 1 TABLET BY MOUTH EVERY DAY Anabel Halon, MD Taking Active   atorvastatin (LIPITOR) 20 MG tablet [id number] Yes TAKE 1 TABLET BY MOUTH EVERY DAY Anabel Halon, MD Taking Active   Blood Glucose Monitoring Suppl (BLOOD GLUCOSE SYSTEM PAK) KIT [id number]  Please dispense based on patient and insurance preference. Use as directed to monitor FSBS 3x daily. Dx: E11.9 Donita Brooks, MD  Active   buprenorphine Lavera Guise) 10 MCG/HR [place] [id number] Yes Place 1 patch onto the skin once a week. [provider] Taking Active   Continuous Blood Gluc Receiver (DEXCOM G5 RECEIVER KIT) DEVI [id number] No 1 each by Does not apply route in the morning, at  noon, in the evening, and at bedtime.  Patient not taking: Reported on 04/07/2021   Anabel Halon, MD Not Taking Active   DULoxetine (CYMBALTA) 30 MG capsule [id number] Yes Take 60 mg by mouth at bedtime. [provider] Taking Active Self  Glucose Blood (BLOOD GLUCOSE TEST STRIPS) STRP [id number]  Please dispense based on patient and insurance preference. Use as directed to monitor FSBS 3x daily. Dx: E11.9 Salley Scarlet, MD  Active   insulin aspart (NOVOLOG) 100 UNIT/ML injection [id number] Yes Inject 5 Units into the skin as needed for high blood sugar (BG >300). [provider] Taking Active   Insulin Glargine (BASAGLAR KWIKPEN) 100 UNIT/ML [id number] Yes Inject 25 Units into the skin at bedtime.  Patient taking differently: Inject 28-30 Units into the skin at bedtime.   [place],  Velna Hatchet, MD Taking Active   Insulin Pen Needle (B-D UF III MINI PEN NEEDLES) 31G X 5 MM MISC [id number]  USE AS DIRECTED TO [facility] INSULIN FOUR TIMES DAILY Roma Kayser, MD  Active   Lancets MISC [id number]  Please dispense based on patient and insurance preference. Use as directed to monitor FSBS 3x daily. Dx: E11.9 Salley Scarlet, MD  Active   losartan (COZAAR) 100 MG tablet [id number] Yes TAKE ONE TABLET BY MOUTH ONCE DAILY Anabel Halon, MD Taking Active   metFORMIN (GLUCOPHAGE-XR) 500 MG 24 hr tablet [id number] Yes TAKE 2 TABLETS(1000 MG) BY MOUTH DAILY WITH BREAKFAST [place], Velna Hatchet, MD Taking Active   omeprazole (PRILOSEC) 20 MG capsule [id number] Yes TAKE 1 CAPSULE BY MOUTH EVERY DAY IN THE [place], Velna Hatchet, MD Taking Active   oxyCODONE-acetaminophen (PERCOCET) 10-325 MG tablet [id number] Yes Take 1 tablet by mouth 2 (two) times daily. [provider] Taking Active   Semaglutide,0.25 or 0.5MG /DOS, 2 MG/1.5ML SOPN [id number] Yes Inject 0.5 mg into the skin every 7 (seven) days. [provider] Taking Active             Patient Active Problem List    Diagnosis Date Noted   Encounter for general adult medical examination with abnormal findings 03/22/2021   Hardening of the aorta (main artery of the heart) (HCC) 10/25/2020   Chronic pain syndrome 10/25/2020   Insomnia 08/18/2020   Colovaginal fistula 05/13/2019   History of diverticulitis 03/19/2019   Failed total knee, left, initial encounter (HCC) 01/09/2019   Anemia 12/31/2015   Arthritis of knee, degenerative 03/12/2015   Arthritis of left knee    Gout 09/07/2014   Back pain with left-sided radiculopathy 09/07/2014   Type 2 diabetes mellitus with diabetic neuropathy (HCC) 02/14/2014   Hyperlipidemia 03/12/2013   IBS (irritable bowel syndrome) 01/09/2013   Diabetic neuropathy (HCC) 11/14/2012   Major depression, single episode 09/13/2012   Essential hypertension, benign 06/09/2012   Sinus tachycardia 03/28/2012   DDD (degenerative disc disease), lumbar 03/28/2012   Chronic knee pain 03/28/2012   Obesity (BMI 30-39.9) 03/28/2012   Tobacco abuse 03/28/2012   CAD (coronary artery disease) 03/28/2012    Immunization History  Administered Date(s) Administered   Moderna Sars-Covid-2 Vaccination 10/01/2019, 10/29/2019, 07/22/2020   PPD Test 09/03/2017   Zoster Recombinat (Shingrix) 11/07/2017, 11/07/2017    Conditions to be addressed/monitored: CAD, HTN, HLD, and DMII  Care Plan : Medication Management  Updates made by Gavin Pound, RPH since 04/07/2021 12:00 AM     Problem: T2DM, HTN, CAD/HLD, Tobacco Use   Priority: High  Onset Date: 04/07/2021     Long-Range Goal: Disease Progression Prevention   Start Date: 04/07/2021  Expected End Date: 07/06/2021  This Visit's Progress: On track  Priority: High  Note:   Current Barriers:  Unable to independently monitor therapeutic efficacy  Pharmacist Clinical Goal(s):  Over the next 90 days, patient will Achieve adherence to monitoring guidelines and medication adherence to achieve therapeutic efficacy through  collaboration with PharmD and provider.   Interventions: 1:1 collaboration with Anabel Halon, MD regarding development and update of comprehensive plan of care as evidenced by provider attestation and co-signature Inter-disciplinary care team collaboration (see longitudinal plan of care) Comprehensive medication review performed; medication list updated in electronic medical record  Type 2 Diabetes: Current medications: metformin 1,000 mg by mouth  twice daily with meals, semaglutide (Ozempic) 0.5 mg subcutaneously weekly on Thursday, and insulin glargine (Basaglar) 25 units  subcutaneously once daily and insulin aspart (Novolog) 5 units as needed for blood glucose >300 Intolerances: none Taking medications as directed: yes Side effects thought to be attributed to current medication regimen: no Denies hypoglycemic/hyperglycemic symptoms Hypoglycemia prevention: not indicated at this time Current meal patterns: breakfast: cereal and fruit; snacks: fruit; drinks: water, coffee without sugar, and diet green tea Current exercise: not discussed today On a statin: [x]  Yes  []  No    Last microalbumin: 0.9 (10/04/20); on an ACEi/ARB: [x]  Yes  []  No    Last eye exam: unknown Last foot exam: unknown Pneumonia vaccine: unknown Current glucose readings: fasting blood glucose: 130-140 per verbal report from patient, post prandial glucose: 200-300 Controlled; Most recent A1c at goal of <7% per ADA guidelines Continue metformin 1,000 mg by mouth  twice daily with meals Recommend to continue titrating Ozempic. Will need to decrease basal insulin requirements with each dose titration.  Patient wants to get Dexcom G6 instead of Freestyle Libre. Discussed patient concern regarding differences in glucose readings between CGM and finger stick blood glucose. Discussed difference between blood glucose and interstitial blood glucose and lag time. Will complete paperwork for Dexcom G6 through DME since patient has  Medicare.   Hypertension: Current medications: losartan 100 mg by mouth once daily and amlodipine 10 mg by mouth once daily Intolerances:  Ace inhibitor (cough) Taking medications as directed: yes Side effects thought to be attributed to current medication regimen: no Home blood pressure readings: 130s/60-70s per patient report Blood pressure under good control. Blood pressure is at goal of <130/80 mmHg per 2017 AHA/ACC guidelines. Continue losartan 100 mg by mouth once daily and amlodipine 10 mg by mouth once daily Encourage dietary sodium restriction/DASH diet Recommend regular aerobic exercise Recommend home blood pressure monitoring to discuss at next visit  Coronary artery disease/Hyperlipidemia: Current medications: atorvastatin 20 mg by mouth once daily Intolerances: none Taking medications as directed: yes Side effects thought to be attributed to current medication regimen: no Controlled; LDL at goal of <70 due to very high risk given diabetes + at least 1 additional major risk factor (hypertension and cigarette smoking) per 2020 AACE/ACE guidelines and TG at goal of <150 per 2020 AACE/ACE guidelines Continue atorvastatin 20 mg by mouth once daily Recommend regular aerobic exercise Smoking cessation is advised. Will discuss more at future visits  Patient Goals/Self-Care Activities Over the next 90  days, patient will:  Take medications as prescribed Check blood sugar twice a day at the following times: fasting (at least 8 hours since last food consumption), bedtime, and whenever patient experiences symptoms of hypo/hyperglycemia, document, and provide at future appointments Check blood pressure at least once daily, document, and provide at future appointments Target a minimum of 150 minutes of moderate intensity exercise weekly Engage in dietary modifications by fewer sweetened foods & beverages  Follow Up Plan: Telephone follow up appointment with care management team member  scheduled for: 05/04/21      Medication Assistance: None required.  Patient affirms current coverage meets needs.  Patient's preferred pharmacy is:  [facility] [place], [place] - [place] [address] [address] Phone: [phone] Fax: [phone]  [id number] [place] [place] - [address] [place] [address] Phone: [phone] Fax: [phone]  Follow Up:  Patient agrees to Care Plan and Follow-up.  Plan: Telephone follow up appointment with care management team member scheduled for:  05/04/21  Domenic Moras, PharmD Clinical Pharmacist  [facility] [phone]

## 2021-04-07 NOTE — Patient Instructions (Signed)
[name],  It was great to talk to you today!  Please call me with any questions or concerns.   Visit Information   PATIENT GOALS:   Goals Addressed             This Visit's Progress    Medication Management       Patient Goals/Self-Care Activities Over the next 90  days, patient will:  Take medications as prescribed Check blood sugar twice a day at the following times: fasting (at least 8 hours since last food consumption), bedtime, and whenever patient experiences symptoms of hypo/hyperglycemia, document, and provide at future appointments Check blood pressure at least once daily, document, and provide at future appointments Target a minimum of 150 minutes of moderate intensity exercise weekly Engage in dietary modifications by fewer sweetened foods & beverages        Consent to CCM Services: Ms. Boss was given information about Chronic Care Management services including:  CCM service includes personalized support from designated clinical staff supervised by her physician, including individualized plan of care and coordination with other care providers 24/7 contact phone numbers for assistance for urgent and routine care needs. Service will only be billed when office clinical staff spend 20 minutes or more in a month to coordinate care. Only one practitioner may furnish and bill the service in a calendar month. The patient may stop CCM services at any time (effective at the end of the month) by phone call to the office staff. The patient will be responsible for cost sharing (co-pay) of up to 20% of the service fee (after annual deductible is met).  Patient agreed to services and verbal consent obtained.   The patient verbalized understanding of instructions, educational materials, and care plan provided today and agreed to receive a mailed copy of patient instructions, educational materials, and care plan.   Telephone follow up appointment with care management  team member scheduled for:05/04/21  Kennon Holter, PharmD Clinical Pharmacist [facility] [phone]  CLINICAL CARE PLAN: Patient Care Plan: Medication Management     Problem Identified: T2DM, HTN, CAD/HLD, Tobacco Use   Priority: High  Onset Date: 04/07/2021     Long-Range Goal: Disease Progression Prevention   Start Date: 04/07/2021  Expected End Date: 07/06/2021  This Visit's Progress: On track  Priority: High  Note:   Current Barriers:  Unable to independently monitor therapeutic efficacy  Pharmacist Clinical Goal(s):  Over the next 90 days, patient will Achieve adherence to monitoring guidelines and medication adherence to achieve therapeutic efficacy through collaboration with PharmD and provider.   Interventions: 1:1 collaboration with Lindell Spar, MD regarding development and update of comprehensive plan of care as evidenced by provider attestation and co-signature Inter-disciplinary care team collaboration (see longitudinal plan of care) Comprehensive medication review performed; medication list updated in electronic medical record  Type 2 Diabetes: Current medications: metformin 1,000 mg by mouth  twice daily with meals, semaglutide (Ozempic) 0.5 mg subcutaneously weekly on Thursday, and insulin glargine (Basaglar) 25 units subcutaneously once daily and insulin aspart (Novolog) 5 units as needed for blood glucose >300 Intolerances: none Taking medications as directed: yes Side effects thought to be attributed to current medication regimen: no Denies hypoglycemic/hyperglycemic symptoms Hypoglycemia prevention: not indicated at this time Current meal patterns: breakfast: cereal and fruit; snacks: fruit; drinks: water, coffee without sugar, and diet green tea Current exercise: not discussed today On a statin: _0  Yes  _1  No    Last microalbumin: 0.9 (10/04/20); on an  ACEi/ARB: _0  Yes  _1  No    Last eye exam: unknown Last foot exam:  unknown Pneumonia vaccine: unknown Current glucose readings: fasting blood glucose: 130-140 per verbal report from patient, post prandial glucose: 200-300 Controlled; Most recent A1c at goal of <7% per ADA guidelines Continue metformin 1,000 mg by mouth  twice daily with meals Recommend to continue titrating Ozempic. Will need to decrease basal insulin requirements with each dose titration.  Patient wants to get Dexcom G6 instead of Freestyle Libre. Discussed patient concern regarding differences in glucose readings between CGM and finger stick blood glucose. Discussed difference between blood glucose and interstitial blood glucose and lag time. Will complete paperwork for Dexcom G6 through DME since patient has Medicare.   Hypertension: Current medications: losartan 100 mg by mouth once daily and amlodipine 10 mg by mouth once daily Intolerances:  Ace inhibitor (cough) Taking medications as directed: yes Side effects thought to be attributed to current medication regimen: no Home blood pressure readings: 130s/60-70s per patient report Blood pressure under good control. Blood pressure is at goal of <130/80 mmHg per 2017 AHA/ACC guidelines. Continue losartan 100 mg by mouth once daily and amlodipine 10 mg by mouth once daily Encourage dietary sodium restriction/DASH diet Recommend regular aerobic exercise Recommend home blood pressure monitoring to discuss at next visit  Coronary artery disease/Hyperlipidemia: Current medications: atorvastatin 20 mg by mouth once daily Intolerances: none Taking medications as directed: yes Side effects thought to be attributed to current medication regimen: no Controlled; LDL at goal of <70 due to very high risk given diabetes + at least 1 additional major risk factor (hypertension and cigarette smoking) per 2020 AACE/ACE guidelines and TG at goal of <150 per 2020 AACE/ACE guidelines Continue atorvastatin 20 mg by mouth once daily Recommend regular aerobic  exercise Smoking cessation is advised. Will discuss more at future visits  Patient Goals/Self-Care Activities Over the next 90  days, patient will:  Take medications as prescribed Check blood sugar twice a day at the following times: fasting (at least 8 hours since last food consumption), bedtime, and whenever patient experiences symptoms of hypo/hyperglycemia, document, and provide at future appointments Check blood pressure at least once daily, document, and provide at future appointments Target a minimum of 150 minutes of moderate intensity exercise weekly Engage in dietary modifications by fewer sweetened foods & beverages  Follow Up Plan: Telephone follow up appointment with care management team member scheduled for: 05/04/21

## 2021-04-15 DIAGNOSIS — M25562 Pain in left knee: Secondary | ICD-10-CM | POA: Diagnosis not present

## 2021-04-15 DIAGNOSIS — M5451 Vertebrogenic low back pain: Secondary | ICD-10-CM | POA: Diagnosis not present

## 2021-04-15 DIAGNOSIS — Z79891 Long term (current) use of opiate analgesic: Secondary | ICD-10-CM | POA: Diagnosis not present

## 2021-04-15 DIAGNOSIS — M25561 Pain in right knee: Secondary | ICD-10-CM | POA: Diagnosis not present

## 2021-04-15 DIAGNOSIS — M542 Cervicalgia: Secondary | ICD-10-CM | POA: Diagnosis not present

## 2021-04-27 DIAGNOSIS — E1140 Type 2 diabetes mellitus with diabetic neuropathy, unspecified: Secondary | ICD-10-CM | POA: Diagnosis not present

## 2021-05-02 ENCOUNTER — Other Ambulatory Visit: Payer: Self-pay | Admitting: *Deleted

## 2021-05-02 ENCOUNTER — Telehealth: Payer: Self-pay | Admitting: Internal Medicine

## 2021-05-02 MED ORDER — BLOOD GLUCOSE TEST VI STRP: ORAL_STRIP | 11 refills | Status: DC

## 2021-05-02 MED ORDER — LANCETS MISC: 11 refills | Status: DC

## 2021-05-02 NOTE — Telephone Encounter (Signed)
Pt states that she has been checking her sugar more than twice a day --and she is running out of supplies   Please call the pt

## 2021-05-02 NOTE — Telephone Encounter (Signed)
Pt lancets and strips sent to pharmacy as she is testing 4 times per day

## 2021-05-03 ENCOUNTER — Telehealth: Payer: Self-pay | Admitting: Internal Medicine

## 2021-05-04 ENCOUNTER — Ambulatory Visit: Payer: Medicare HMO | Admitting: Pharmacist

## 2021-05-04 ENCOUNTER — Other Ambulatory Visit: Payer: Self-pay

## 2021-05-04 DIAGNOSIS — Z720 Tobacco use: Secondary | ICD-10-CM

## 2021-05-04 DIAGNOSIS — I10 Essential (primary) hypertension: Secondary | ICD-10-CM

## 2021-05-04 DIAGNOSIS — I2510 Atherosclerotic heart disease of native coronary artery without angina pectoris: Secondary | ICD-10-CM

## 2021-05-04 DIAGNOSIS — E1140 Type 2 diabetes mellitus with diabetic neuropathy, unspecified: Secondary | ICD-10-CM | POA: Diagnosis not present

## 2021-05-04 DIAGNOSIS — Z794 Long term (current) use of insulin: Secondary | ICD-10-CM | POA: Diagnosis not present

## 2021-05-04 DIAGNOSIS — E785 Hyperlipidemia, unspecified: Secondary | ICD-10-CM | POA: Diagnosis not present

## 2021-05-04 DIAGNOSIS — Z7689 Persons encountering health services in other specified circumstances: Secondary | ICD-10-CM | POA: Diagnosis not present

## 2021-05-04 NOTE — Chronic Care Management (AMB) (Signed)
Chronic Care Management Pharmacy Note  05/04/2021 Name:  Stephanie Hammond MRN:  [id number] DOB:  01/12/1951  Summary:  Patient has now received her Dexcom G6. No longer using Freestyle Libre. Purpose of today's visit was to educate patient on how to place and use Dexcom G6 to monitor blood glucose. Patient had intended to use the [facility] G6 receiver device. Medicare only pays for 1 receiver device every 5 years so the DME company did not send her the receiver. She has the transmitter and sensor supplies. The patient has an iPhone 8 which is compatible with Dexcom G6; however, the patient did not have access to her e-mail today so we were unable to setup her Dexcom account and place her first sensor. Patient was given verbal and written instructions on next steps and she verbalized understanding and will request her family members to help her with this process.  Will follow-up with patient in a few weeks to review blood glucose and make adjustments to medications if necessary   Subjective: Stephanie Hammond is an 70 y.o. year old female who is a primary patient of Anabel Halon, MD.  The CCM team was consulted for assistance with disease management and care coordination needs.    Engaged with patient face to face for follow up visit in response to provider referral for pharmacy case management and/or care coordination services.   Consent to Services:  The patient was given information about Chronic Care Management services, agreed to services, and gave verbal consent prior to initiation of services.  Please see initial visit note for detailed documentation.   Patient Care Team: Anabel Halon, MD as PCP - General (Internal Medicine) [place] Bali, MD (Inactive) as Consulting Physician (Gastroenterology) Gavin Pound, [facility] (Pharmacist)  Objective:  Lab Results  Component Value Date   CREATININE 0.87 03/19/2020   CREATININE 0.87 01/21/2020   CREATININE 0.86 11/10/2019     Lab Results  Component Value Date   HGBA1C CANCELED 10/04/2020   Last diabetic Eye exam:  Lab Results  Component Value Date/Time   HMDIABEYEEXA No Retinopathy 11/19/2019 12:00 AM    Last diabetic Foot exam: No results found for: HMDIABFOOTEX      Component Value Date/Time   CHOL 92 03/19/2020 1127   TRIG 85 03/19/2020 1127   HDL 37 (L) 03/19/2020 1127   CHOLHDL 2.5 03/19/2020 1127   VLDL 27 09/11/2016 1255   LDLCALC CANCELED 10/04/2020 1301   LDLDIRECT 68 12/01/2013 1454    Hepatic Function Latest Ref Rng & Units 03/19/2020 05/13/2019 12/03/2018  Total Protein 6.1 - 8.1 g/dL 6.7 7.2 7.0  Albumin 3.8 - 4.8 g/dL - - 4.2  AST 10 - 35 U/L 12 15 13   ALT 6 - 29 U/L 10 14 17   Alk Phosphatase 39 - 117 IU/L - - 131(H)  Total Bilirubin 0.2 - 1.2 mg/dL 0.3 0.3 0.2    Lab Results  Component Value Date/Time   TSH 0.75 09/10/2017 03:01 PM   TSH 1.384 05/27/2012 02:08 PM    CBC Latest Ref Rng & Units 10/04/2020 05/03/2020 03/19/2020  WBC - CANCELED 6.2 7.0  Hemoglobin 11.7 - 15.5 g/dL - 10.2(L) 10.2(L)  Hematocrit 35.0 - 45.0 % - 31.4(L) 31.9(L)  Platelets 140 - 400 Thousand/uL - 373 344    No results found for: VD25OH  Clinical ASCVD: No  The ASCVD Risk score (Arnett DK, et al., 2019) failed to calculate for the following reasons:   The valid total  cholesterol range is 130 to 320 mg/dL    Social History   Tobacco Use  Smoking Status Every Day   Packs/day: 1.00   Years: 35.00   Pack years: 35.00   Types: Cigarettes  Smokeless Tobacco Never   BP Readings from Last 3 Encounters:  03/22/21 125/74  10/25/20 121/70  10/04/20 138/64   Pulse Readings from Last 3 Encounters:  03/22/21 91  10/25/20 (!) 111  10/04/20 100   Wt Readings from Last 3 Encounters:  03/22/21 208 lb 1.3 oz (94.4 kg)  10/25/20 208 lb 1.9 oz (94.4 kg)  10/04/20 209 lb (94.8 kg)    Assessment: Review of patient past medical history, allergies, medications, health status, including review of  consultants reports, laboratory and other test data, was performed as part of comprehensive evaluation and provision of chronic care management services.   SDOH:  (Social Determinants of Health) assessments and interventions performed:    CCM Care Plan  Allergies  Allergen Reactions   Ace Inhibitors Cough   Bentyl [Dicyclomine Hcl] Other (See Comments)    Blurry vision   Penicillins Itching and Swelling    Whelps Did it involve swelling of the face/tongue/throat, SOB, or low BP? No Did it involve sudden or severe rash/hives, skin peeling, or any reaction on the inside of your mouth or nose? No Did you need to seek medical attention at a hospital or doctor's office? No When did it last happen?      20 years If all above answers are "NO", may proceed with cephalosporin use.     Medications Reviewed Today     Reviewed by Gavin Pound, [facility] (Pharmacist) on 05/04/21 at 1157  Med List Status: <None>   Medication Order Taking? Sig Documenting Provider Last Dose Status Informant  allopurinol (ZYLOPRIM) 300 MG tablet [id number] Yes TAKE 1 TABLET BY MOUTH EVERY DAY [place], Velna Hatchet, MD Taking Active   amLODipine (NORVASC) 10 MG tablet [id number] Yes TAKE 1 TABLET BY MOUTH EVERY DAY Anabel Halon, MD Taking Active   atorvastatin (LIPITOR) 20 MG tablet [id number] Yes TAKE 1 TABLET BY MOUTH EVERY DAY Anabel Halon, MD Taking Active   Blood Glucose Monitoring Suppl (BLOOD GLUCOSE SYSTEM PAK) KIT [id number]  Please dispense based on patient and insurance preference. Use as directed to monitor FSBS 3x daily. Dx: E11.9 Donita Brooks, MD  Active   buprenorphine Lavera Guise) 10 MCG/HR [place] [id number] Yes Place 1 patch onto the skin once a week. [provider] Taking Active   Continuous Blood Gluc Receiver (DEXCOM G5 RECEIVER KIT) DEVI [id number] No 1 each by Does not apply route in the morning, at noon, in the evening, and at bedtime.  Patient not taking: No sig reported   Anabel Halon, MD Not Taking Active   DULoxetine (CYMBALTA) 60 MG capsule [id number] Yes Take 60 mg by mouth daily. [provider] Taking Active   Glucose Blood (BLOOD GLUCOSE TEST STRIPS) STRP [id number]  Please dispense based on patient and insurance preference. Use as directed to monitor FSBS 3x daily. Dx: E11.9  For testing 4 times daily Anabel Halon, MD  Active   insulin aspart (NOVOLOG) 100 UNIT/ML injection [id number]  Inject 5 Units into the skin as needed for high blood sugar (BG >300). [provider]  Active   Insulin Glargine (BASAGLAR KWIKPEN) 100 UNIT/ML [id number] Yes Inject 25 Units into the skin at bedtime.  Patient taking differently: Inject 28-30 Units into the skin at bedtime.  Jeanice Lim, Velna Hatchet, MD Taking Active   Insulin Pen Needle (B-D UF III MINI PEN NEEDLES) 31G X 5 MM MISC [id number]  USE AS DIRECTED TO [facility] INSULIN FOUR TIMES DAILY Roma Kayser, MD  Active   Lancets MISC [id number]  Please dispense based on patient and insurance preference. Use as directed to monitor FSBS 3x daily. Dx: E11.9  For testing 4 times daily Anabel Halon, MD  Active   losartan (COZAAR) 100 MG tablet [id number] Yes TAKE ONE TABLET BY MOUTH ONCE DAILY Anabel Halon, MD Taking Active   metFORMIN (GLUCOPHAGE-XR) 500 MG 24 hr tablet [id number] Yes TAKE 2 TABLETS(1000 MG) BY MOUTH DAILY WITH BREAKFAST [place], Velna Hatchet, MD Taking Active   omeprazole (PRILOSEC) 20 MG capsule [id number] Yes TAKE 1 CAPSULE BY MOUTH EVERY DAY IN THE [place], Velna Hatchet, MD Taking Active   oxyCODONE-acetaminophen (PERCOCET) 10-325 MG tablet [id number] Yes Take 1 tablet by mouth 2 (two) times daily. [provider] Taking Active   Semaglutide,0.25 or 0.5MG /DOS, 2 MG/1.5ML SOPN [id number] Yes Inject 0.5 mg into the skin every 7 (seven) days. [provider] Taking Active             Patient Active Problem List   Diagnosis Date Noted   Encounter for general adult medical  examination with abnormal findings 03/22/2021   Hardening of the aorta (main artery of the heart) (HCC) 10/25/2020   Chronic pain syndrome 10/25/2020   Insomnia 08/18/2020   Colovaginal fistula 05/13/2019   History of diverticulitis 03/19/2019   Failed total knee, left, initial encounter (HCC) 01/09/2019   Anemia 12/31/2015   Arthritis of knee, degenerative 03/12/2015   Arthritis of left knee    Gout 09/07/2014   Back pain with left-sided radiculopathy 09/07/2014   Type 2 diabetes mellitus with diabetic neuropathy (HCC) 02/14/2014   Hyperlipidemia 03/12/2013   IBS (irritable bowel syndrome) 01/09/2013   Diabetic neuropathy (HCC) 11/14/2012   Major depression, single episode 09/13/2012   Essential hypertension, benign 06/09/2012   Sinus tachycardia 03/28/2012   DDD (degenerative disc disease), lumbar 03/28/2012   Chronic knee pain 03/28/2012   Obesity (BMI 30-39.9) 03/28/2012   Tobacco abuse 03/28/2012   CAD (coronary artery disease) 03/28/2012    Immunization History  Administered Date(s) Administered   Moderna Sars-Covid-2 Vaccination 10/01/2019, 10/29/2019, 07/22/2020   PPD Test 09/03/2017   Zoster Recombinat (Shingrix) 11/07/2017, 11/07/2017    Conditions to be addressed/monitored: CAD, HTN, HLD, DMII, and tobacco use  Care Plan : Medication Management  Updates made by Gavin Pound, RPH since 05/04/2021 12:00 AM     Problem: T2DM, HTN, CAD/HLD, Tobacco Use   Priority: High  Onset Date: 04/07/2021     Long-Range Goal: Disease Progression Prevention   Start Date: 04/07/2021  Expected End Date: 07/06/2021  Recent Progress: On track  Priority: High  Note:   Current Barriers:  Unable to independently monitor therapeutic efficacy  Pharmacist Clinical Goal(s):  Over the next 90 days, patient will Achieve adherence to monitoring guidelines and medication adherence to achieve therapeutic efficacy through collaboration with PharmD and provider.    Interventions: 1:1 collaboration with Anabel Halon, MD regarding development and update of comprehensive plan of care as evidenced by provider attestation and co-signature Inter-disciplinary care team collaboration (see longitudinal plan of care) Comprehensive medication review performed; medication list updated in electronic medical record  Type 2 Diabetes: Current medications: metformin 1,000 mg by mouth  twice daily with meals, semaglutide (Ozempic) 0.5 mg subcutaneously weekly  on Thursday, and insulin glargine (Basaglar) 28-30 units subcutaneously once daily and insulin aspart (Novolog) 5 units as needed for blood glucose >300 Intolerances: none Taking medications as directed: yes Side effects thought to be attributed to current medication regimen: no Denies hypoglycemic/hyperglycemic symptoms Hypoglycemia prevention: not indicated at this time Current meal patterns: breakfast: cereal and fruit; snacks: fruit; drinks: water, coffee without sugar, and diet green tea Current exercise: not discussed today On a statin: [x]  Yes  []  No    Last microalbumin: 0.9 (10/04/20); on an ACEi/ARB: [x]  Yes  []  No    Most recent glucose readings: fasting blood glucose: 130-140 per verbal report from patient, post prandial glucose: 200-300 Controlled; Most recent A1c at goal of <7% per ADA guidelines Continue metformin 1,000 mg by mouth twice daily with meals Recommend to continue titrating Ozempic. Will need to decrease basal insulin requirements with each dose titration.  Patient has now received her Dexcom G6. No longer using Freestyle Libre. Purpose of today's visit was to educate patient on how to place and use Dexcom G6 to monitor blood glucose. Patient had intended to use the [facility] G6 receiver device. Medicare only pays for 1 receiver device every 5 years so the DME company did not send her the receiver. She has the transmitter and sensor supplies. The patient has an iPhone 8 which is compatible  with Dexcom G6; however, the patient did not have access to her e-mail today so we were unable to setup her Dexcom account and place her first sensor. Patient was given verbal and written instructions on next steps and she verbalized understanding and will request her family members to help her with this process.  Will follow-up with patient in a few weeks to review blood glucose and make adjustments to medications if necessary   Hypertension: Current medications: losartan 100 mg by mouth once daily and amlodipine 10 mg by mouth once daily Intolerances:  Ace inhibitor (cough) Taking medications as directed: yes Side effects thought to be attributed to current medication regimen: no Home blood pressure readings: 130s/60-70s per patient report Blood pressure under good control. Blood pressure is at goal of <130/80 mmHg per 2017 AHA/ACC guidelines. Continue losartan 100 mg by mouth once daily and amlodipine 10 mg by mouth once daily Encourage dietary sodium restriction/DASH diet Recommend regular aerobic exercise Recommend home blood pressure monitoring to discuss at next visit  Coronary artery disease/Hyperlipidemia: Current medications: atorvastatin 20 mg by mouth once daily Intolerances: none Taking medications as directed: yes Side effects thought to be attributed to current medication regimen: no Controlled; LDL at goal of <70 due to very high risk given diabetes + at least 1 additional major risk factor (hypertension and cigarette smoking) per 2020 AACE/ACE guidelines and TG at goal of <150 per 2020 AACE/ACE guidelines Continue atorvastatin 20 mg by mouth once daily Recommend regular aerobic exercise Smoking cessation is advised. Will discuss more at future visits  Patient Goals/Self-Care Activities Over the next 90 days, patient will:  Take medications as prescribed Check blood sugar twice a day at the following times: fasting (at least 8 hours since last food consumption), bedtime,  and whenever patient experiences symptoms of hypo/hyperglycemia, document, and provide at future appointments Check blood pressure at least once daily, document, and provide at future appointments Target a minimum of 150 minutes of moderate intensity exercise weekly Engage in dietary modifications by fewer sweetened foods & beverages  Follow Up Plan: Telephone follow up appointment with care management team member scheduled for:  05/18/21      Medication Assistance: None required.  Patient affirms current coverage meets needs.  Patient's preferred pharmacy is:  [facility] [place], [place] - [place] [address] [address] Phone: [phone] Fax: [phone]  [id number] - [place], [place] - [address] [place] [address] Phone: [phone] Fax: [phone]  Follow Up:  Patient agrees to Care Plan and Follow-up.  Plan: Telephone follow up appointment with care management team member scheduled for:  05/18/21  Domenic Moras, PharmD Clinical Pharmacist [facility] [phone]

## 2021-05-04 NOTE — Patient Instructions (Signed)
[name],  It was great to talk to you today!  Please call me with any questions or concerns.   Visit Information  PATIENT GOALS:  Goals Addressed             This Visit's Progress    Medication Management       Patient Goals/Self-Care Activities Over the next 90 days, patient will:  Take medications as prescribed Check blood sugar twice a day at the following times: fasting (at least 8 hours since last food consumption), bedtime, and whenever patient experiences symptoms of hypo/hyperglycemia, document, and provide at future appointments Check blood pressure at least once daily, document, and provide at future appointments Target a minimum of 150 minutes of moderate intensity exercise weekly Engage in dietary modifications by fewer sweetened foods & beverages        Patient verbalizes understanding of instructions provided today and agrees to view in [place].   Telephone follow up appointment with care management team member scheduled for:05/18/21  Kennon Holter, PharmD Clinical Pharmacist [facility] [phone]  Dexcom G6 Training  1. For help or to see the videos: [email or web address]://www.dexcom.com/training-videos  2. You should have a sensor, a transmitter, and a display device. - Each sensor is good for 10 days - Each transmitter is good for 3 months    3. You will still need your glucose meter - If you take acetaminophen 1000 mg every 6 hours or more - Any time your symptoms do not match the reading on your Dexcom - Any time you don't have a number and an arrow  If you have a MRI or CT scan - The company recommends you not wear the dexcom as it is unknown if the heat and magnetic fields could damage the components. - To get the most out of your session, we advise that you try to schedule your procedure near the end of your sensor session to avoid needing an extra sensor.  4. Change the site where you place the sensor with each new  insertion.  If there is adhesive residue left on the skin, you can try applying a household oil like baby oil or coconut oil.  5. Avoid injecting insulin within 3 inches of the sensor.  6. Water. The transmitter is water resistant, but the receiver is not.  You can still swim, shower, and take a bath.  However, your receiver needs to be in a dry area and within 20 feet of the transmitter to get readings.    7. If you want to use your smartphone: - Download the Dexcom G6 app.  You will need to create a Dexcom account.   - The app and the receiver do not talk to each other.  Therefore, you'll have to set up alerts in the app as well.   8. If you want to share your data with your provider. - Once you have downloaded the Dexcom G6 app, download the Dexcom Clarity app. - If you use the Dexcom G6 app, you will not need to upload data to a computer since the data is automatically sent to [place].   - The clinic will then invite you to share your data with the clinic's account in [place].    Here is how the clinic will create an invitation to send to you:  Click Patients.  Click the patient's name you want to invite.  Click Share data.  Click Print an Invitation to view and print, or Email an Invitation,  then enter the patient's email address.  Click Invite, then Close.  9. You can call Dexcom for help: [phone] - For an error message or issues with the sensors: select option 2. - For help with data sharing: select option 3 for Patient Care. They are available Monday through Friday 9 am-8pm andSaturday 10 am-4:30 pm.   10.  The sensor glucose may not always be the same as your blood glucose. Sensor glucose is a glucose estimate that lags behind the blood glucose.

## 2021-05-05 ENCOUNTER — Telehealth: Payer: Self-pay | Admitting: Internal Medicine

## 2021-05-05 LAB — LIPID PANEL
Chol/HDL Ratio: 2.3 ratio (ref 0.0–4.4)
Cholesterol, Total: 101 mg/dL (ref 100–199)
HDL: 44 mg/dL (ref >39)
LDL Chol Calc (NIH): 42 mg/dL (ref 0–99)
Triglycerides: 69 mg/dL (ref 0–149)
VLDL Cholesterol Cal: 15 mg/dL (ref 5–40)

## 2021-05-05 LAB — CBC
Hematocrit: 35.7 % (ref 34.0–46.6)
Hemoglobin: 12.5 g/dL (ref 11.1–15.9)
MCH: 32.2 pg (ref 26.6–33.0)
MCHC: 35 g/dL (ref 31.5–35.7)
MCV: 92 fL (ref 79–97)
Platelets: 339 10*3/uL (ref 150–450)
RBC: 3.88 x10E6/uL (ref 3.77–5.28)
RDW: 13.5 % (ref 11.7–15.4)
WBC: 6.2 10*3/uL (ref 3.4–10.8)

## 2021-05-05 LAB — TSH: TSH: 0.588 u[IU]/mL (ref 0.450–4.500)

## 2021-05-05 LAB — CMP14+EGFR
ALT: 18 IU/L (ref 0–32)
AST: 17 IU/L (ref 0–40)
Albumin/Globulin Ratio: 1.5 (ref 1.2–2.2)
Albumin: 4.3 g/dL (ref 3.8–4.8)
Alkaline Phosphatase: 162 IU/L — ABNORMAL HIGH (ref 44–121)
BUN/Creatinine Ratio: 24 (ref 12–28)
BUN: 20 mg/dL (ref 8–27)
Bilirubin Total: <0.2 mg/dL (ref 0.0–1.2)
CO2: 22 mmol/L (ref 20–29)
Calcium: 9.4 mg/dL (ref 8.7–10.3)
Chloride: 104 mmol/L (ref 96–106)
Creatinine, Ser: 0.84 mg/dL (ref 0.57–1.00)
Globulin, Total: 2.8 g/dL (ref 1.5–4.5)
Glucose: 121 mg/dL — ABNORMAL HIGH (ref 70–99)
Potassium: 5.4 mmol/L — ABNORMAL HIGH (ref 3.5–5.2)
Sodium: 138 mmol/L (ref 134–144)
Total Protein: 7.1 g/dL (ref 6.0–8.5)
eGFR: 76 mL/min/{1.73_m2} (ref >59)

## 2021-05-05 LAB — HEMOGLOBIN A1C
Est. average glucose Bld gHb Est-mCnc: 108 mg/dL
Hgb A1c MFr Bld: 5.4 % (ref 4.8–5.6)

## 2021-05-05 LAB — URIC ACID: Uric Acid: 3.9 mg/dL (ref 3.0–7.2)

## 2021-05-05 NOTE — Telephone Encounter (Signed)
Pt advised of lab results with verbal understanding  

## 2021-05-05 NOTE — Telephone Encounter (Signed)
Pt is calling to get lab results.

## 2021-05-06 ENCOUNTER — Telehealth: Payer: Self-pay | Admitting: Internal Medicine

## 2021-05-06 DIAGNOSIS — Z794 Long term (current) use of insulin: Secondary | ICD-10-CM | POA: Diagnosis not present

## 2021-05-06 DIAGNOSIS — E785 Hyperlipidemia, unspecified: Secondary | ICD-10-CM

## 2021-05-06 DIAGNOSIS — I2510 Atherosclerotic heart disease of native coronary artery without angina pectoris: Secondary | ICD-10-CM | POA: Diagnosis not present

## 2021-05-06 DIAGNOSIS — E1140 Type 2 diabetes mellitus with diabetic neuropathy, unspecified: Secondary | ICD-10-CM

## 2021-05-06 DIAGNOSIS — I10 Essential (primary) hypertension: Secondary | ICD-10-CM

## 2021-05-06 NOTE — Telephone Encounter (Signed)
Patient called in reference to her blood work results

## 2021-05-09 ENCOUNTER — Ambulatory Visit: Payer: Medicare HMO | Admitting: Internal Medicine

## 2021-05-09 ENCOUNTER — Other Ambulatory Visit: Payer: Self-pay | Admitting: Internal Medicine

## 2021-05-09 DIAGNOSIS — I10 Essential (primary) hypertension: Secondary | ICD-10-CM

## 2021-05-12 ENCOUNTER — Other Ambulatory Visit: Payer: Self-pay | Admitting: Internal Medicine

## 2021-05-18 ENCOUNTER — Telehealth: Payer: Medicare HMO

## 2021-05-18 ENCOUNTER — Telehealth: Payer: Self-pay | Admitting: Pharmacist

## 2021-05-18 NOTE — Telephone Encounter (Signed)
Care Management   Follow Up Note  05/18/2021 Name: Stephanie Hammond MRN: [id number] DOB: Dec 07, 1950  Referred by: Anabel Halon, MD Reason for referral : Chronic Care Management (Unsuccessful Telephone Outreach)  An unsuccessful telephone outreach was attempted today. The patient was referred to the case management team for assistance with care management and care coordination.   Follow Up Plan: The care management team will reach out to the patient again over the next 7 days.   Domenic Moras, PharmD Clinical Pharmacist [facility] [phone]

## 2021-05-19 ENCOUNTER — Ambulatory Visit (INDEPENDENT_AMBULATORY_CARE_PROVIDER_SITE_OTHER): Payer: Medicare HMO | Admitting: Pharmacist

## 2021-05-19 DIAGNOSIS — I2510 Atherosclerotic heart disease of native coronary artery without angina pectoris: Secondary | ICD-10-CM

## 2021-05-19 DIAGNOSIS — Z720 Tobacco use: Secondary | ICD-10-CM

## 2021-05-19 DIAGNOSIS — E1140 Type 2 diabetes mellitus with diabetic neuropathy, unspecified: Secondary | ICD-10-CM

## 2021-05-19 DIAGNOSIS — I10 Essential (primary) hypertension: Secondary | ICD-10-CM

## 2021-05-19 DIAGNOSIS — Z794 Long term (current) use of insulin: Secondary | ICD-10-CM

## 2021-05-19 DIAGNOSIS — E785 Hyperlipidemia, unspecified: Secondary | ICD-10-CM

## 2021-05-19 NOTE — Patient Instructions (Signed)
[name],  It was great to talk to you today!  Please call me with any questions or concerns.   Visit Information  PATIENT GOALS:  Goals Addressed             This Visit's Progress    Medication Management       Patient Goals/Self-Care Activities Over the next 90 days, patient will:  Take medications as prescribed Check blood sugar twice a day at the following times: fasting (at least 8 hours since last food consumption), bedtime, and whenever patient experiences symptoms of hypo/hyperglycemia, document, and provide at future appointments Check blood pressure at least once daily, document, and provide at future appointments Target a minimum of 150 minutes of moderate intensity exercise weekly Engage in dietary modifications by fewer sweetened foods & beverages          The patient verbalized understanding of instructions, educational materials, and care plan provided today and declined offer to receive copy of patient instructions, educational materials, and care plan.   Face to Face appointment with care management team member scheduled for: 06/22/21   Kennon Holter, PharmD Clinical Pharmacist [facility] [phone]

## 2021-05-19 NOTE — Chronic Care Management (AMB) (Signed)
Chronic Care Management Pharmacy Note  05/19/2021 Name:  Stephanie Hammond MRN:  [id number] DOB:  1951/03/14  Summary:  Patient returned call from missed telephone appointment yesterday. Worked in today but only limited time available. Patient reports 1 hypoglycemic event on 05/10/21 after patient took Novolog 10 units with blood glucose in the 200s. This dropped her blood glucose to the 50s and she had to treat it. Reminded patient to only use Novolog 5 units when blood glucose >300.     Most recent glucose readings: patient reports fasting blood glucose of 97-116 per Dexcom G6 although reports higher fasting blood glucose ~130-140s when checking finger sticks. Discussed differences between blood glucose and interstitial glucose. Continue monitoring blood glucose continuously with Dexcom G6. Will have patient return in 4 weeks to setup with [facility] Dexcom Clarity account and make adjustments to medications if necessary   Patient also complains of headache today which after a brief discussion may be related to her large caffeine intake. Patient reports drinking coffee and diet green tea all day. She also drinks very little water stating that she has not had water in 2 days. Recommended that the patient increase water intake and taper down on caffeine drinks to see if headaches resolves. Otherwise may need to workup for migraines.   Subjective: Stephanie Hammond is an 70 y.o. year old female who is a primary patient of Anabel Halon, MD.  The CCM team was consulted for assistance with disease management and care coordination needs.    Engaged with patient by telephone for follow up visit in response to provider referral for pharmacy case management and/or care coordination services.   Consent to Services:  The patient was given information about Chronic Care Management services, agreed to services, and gave verbal consent prior to initiation of services.  Please see initial visit note for  detailed documentation.   Patient Care Team: Anabel Halon, MD as PCP - General (Internal Medicine) [place], MD (Inactive) as Consulting Physician (Gastroenterology) Gavin Pound, [facility] (Pharmacist)  Objective:  Lab Results  Component Value Date   CREATININE 0.84 05/04/2021   CREATININE 0.87 03/19/2020   CREATININE 0.87 01/21/2020    Lab Results  Component Value Date   HGBA1C 5.4 05/04/2021   Last diabetic Eye exam:  Lab Results  Component Value Date/Time   HMDIABEYEEXA No Retinopathy 11/19/2019 12:00 AM    Last diabetic Foot exam: No results found for: HMDIABFOOTEX      Component Value Date/Time   CHOL 101 05/04/2021 1023   TRIG 69 05/04/2021 1023   HDL 44 05/04/2021 1023   CHOLHDL 2.3 05/04/2021 1023   CHOLHDL 2.5 03/19/2020 1127   VLDL 27 09/11/2016 1255   LDLCALC 42 05/04/2021 1023   LDLCALC CANCELED 10/04/2020 1301   LDLDIRECT 68 12/01/2013 1454    Hepatic Function Latest Ref Rng & Units 05/04/2021 03/19/2020 05/13/2019  Total Protein 6.0 - 8.5 g/dL 7.1 6.7 7.2  Albumin 3.8 - 4.8 g/dL 4.3 - -  AST 0 - 40 IU/L 17 12 15   ALT 0 - 32 IU/L 18 10 14   Alk Phosphatase 44 - 121 IU/L 162(H) - -  Total Bilirubin 0.0 - 1.2 mg/dL <4.0 0.3 0.3    Lab Results  Component Value Date/Time   TSH 0.588 05/04/2021 10:23 AM   TSH 0.75 09/10/2017 03:01 PM    CBC Latest Ref Rng & Units 05/04/2021 10/04/2020 05/03/2020  WBC 3.4 - 10.8 x10E3/uL 6.2 CANCELED 6.2  Hemoglobin 11.1 - 15.9 g/dL 11.9 - 10.2(L)  Hematocrit 34.0 - 46.6 % 35.7 - 31.4(L)  Platelets 150 - 450 x10E3/uL 339 - 373    No results found for: VD25OH  Clinical ASCVD: No  The ASCVD Risk score (Arnett DK, et al., 2019) failed to calculate for the following reasons:   The valid total cholesterol range is 130 to 320 mg/dL    Social History   Tobacco Use  Smoking Status Every Day   Packs/day: 1.00   Years: 35.00   Pack years: 35.00   Types: Cigarettes  Smokeless Tobacco Never   BP  Readings from Last 3 Encounters:  03/22/21 125/74  10/25/20 121/70  10/04/20 138/64   Pulse Readings from Last 3 Encounters:  03/22/21 91  10/25/20 (!) 111  10/04/20 100   Wt Readings from Last 3 Encounters:  03/22/21 208 lb 1.3 oz (94.4 kg)  10/25/20 208 lb 1.9 oz (94.4 kg)  10/04/20 209 lb (94.8 kg)    Assessment: Review of patient past medical history, allergies, medications, health status, including review of consultants reports, laboratory and other test data, was performed as part of comprehensive evaluation and provision of chronic care management services.   SDOH:  (Social Determinants of Health) assessments and interventions performed:    CCM Care Plan  Allergies  Allergen Reactions   Ace Inhibitors Cough   Bentyl [Dicyclomine Hcl] Other (See Comments)    Blurry vision   Penicillins Itching and Swelling    [place]hel[place] Did it involve swelling of the face/tongue/throat, SOB, or low BP? No Did it involve sudden or severe rash/hives, skin peeling, or any reaction on the inside of your mouth or nose? No Did you need to seek medical attention at a hospital or doctor's office? No When did it last happen?      20 years If all above answers are "NO", may proceed with cephalosporin use.     Medications Reviewed Today     Reviewed by Gavin Pound, [facility] (Pharmacist) on 05/19/21 at 0855  Med List Status: <None>   Medication Order Taking? Sig Documenting Provider Last Dose Status Informant  allopurinol (ZYLOPRIM) 300 MG tablet [id number] Yes TAKE ONE TABLET BY MOUTH EVERY DAY Anabel Halon, MD Taking Active   amLODipine (NORVASC) 10 MG tablet [id number] Yes TAKE ONE TABLET BY MOUTH ONCE DAILY Anabel Halon, MD Taking Active   atorvastatin (LIPITOR) 20 MG tablet [id number] Yes TAKE 1 TABLET BY MOUTH EVERY DAY Anabel Halon, MD Taking Active   Blood Glucose Monitoring Suppl (BLOOD GLUCOSE SYSTEM PAK) KIT [id number]  Please dispense based on patient and insurance  preference. Use as directed to monitor FSBS 3x daily. Dx: E11.9 Donita Brooks, MD  Active   buprenorphine Lavera Guise) 10 MCG/HR [place] [id number] Yes Place 1 patch onto the skin once a week. [provider] Taking Active   Continuous Blood Gluc Sensor (DEXCOM G6 SENSOR) MISC [id number] Yes Apply new sensor every 10 days [provider] Taking Active Self  Continuous Blood Gluc Transmit (DEXCOM G6 TRANSMITTER) MISC [id number] Yes Use 1 transmitter every 90 days [provider] Taking Active Self  DULoxetine (CYMBALTA) 60 MG capsule [id number] Yes Take 60 mg by mouth daily. [provider] Taking Active   Glucose Blood (BLOOD GLUCOSE TEST STRIPS) STRP [id number]  Please dispense based on patient and insurance preference. Use as directed to monitor FSBS 3x daily. Dx: E11.9  For testing 4 times daily Anabel Halon, MD  Active  insulin aspart (NOVOLOG) 100 UNIT/ML injection [id number] Yes Inject 5 Units into the skin as needed for high blood sugar (BG >300). [provider] Taking Active   Insulin Glargine (BASAGLAR KWIKPEN) 100 UNIT/ML [id number] Yes Inject 25 Units into the skin at bedtime.  Patient taking differently: Inject 28-30 Units into the skin at bedtime.   Jeanice Lim, Velna Hatchet, MD Taking Active   Insulin Pen Needle (B-D UF III MINI PEN NEEDLES) 31G X 5 MM MISC [id number]  USE AS DIRECTED TO [facility] INSULIN FOUR TIMES DAILY Roma Kayser, MD  Active   Lancets MISC [id number]  Please dispense based on patient and insurance preference. Use as directed to monitor FSBS 3x daily. Dx: E11.9  For testing 4 times daily Anabel Halon, MD  Active   losartan (COZAAR) 100 MG tablet [id number] Yes TAKE ONE TABLET BY MOUTH ONCE DAILY Anabel Halon, MD Taking Active   metFORMIN (GLUCOPHAGE-XR) 500 MG 24 hr tablet [id number] Yes TAKE 2 TABLETS(1000 MG) BY MOUTH DAILY WITH BREAKFAST [place], Velna Hatchet, MD Taking Active   omeprazole (PRILOSEC) 20 MG capsule [id number]  Yes TAKE 1 CAPSULE BY MOUTH EVERY DAY IN THE [place], Velna Hatchet, MD Taking Active   oxyCODONE-acetaminophen (PERCOCET) 10-325 MG tablet [id number] Yes Take 1 tablet by mouth 2 (two) times daily. [provider] Taking Active   Semaglutide,0.25 or 0.5MG /DOS, 2 MG/1.5ML SOPN [id number] Yes Inject 0.5 mg into the skin every 7 (seven) days. [provider] Taking Active             Patient Active Problem List   Diagnosis Date Noted   Encounter for general adult medical examination with abnormal findings 03/22/2021   Hardening of the aorta (main artery of the heart) (HCC) 10/25/2020   Chronic pain syndrome 10/25/2020   Insomnia 08/18/2020   Colovaginal fistula 05/13/2019   History of diverticulitis 03/19/2019   Failed total knee, left, initial encounter (HCC) 01/09/2019   Anemia 12/31/2015   Arthritis of knee, degenerative 03/12/2015   Arthritis of left knee    Gout 09/07/2014   Back pain with left-sided radiculopathy 09/07/2014   Type 2 diabetes mellitus with diabetic neuropathy (HCC) 02/14/2014   Hyperlipidemia 03/12/2013   IBS (irritable bowel syndrome) 01/09/2013   Diabetic neuropathy (HCC) 11/14/2012   Major depression, single episode 09/13/2012   Essential hypertension, benign 06/09/2012   Sinus tachycardia 03/28/2012   DDD (degenerative disc disease), lumbar 03/28/2012   Chronic knee pain 03/28/2012   Obesity (BMI 30-39.9) 03/28/2012   Tobacco abuse 03/28/2012   CAD (coronary artery disease) 03/28/2012    Immunization History  Administered Date(s) Administered   Moderna Sars-Covid-2 Vaccination 10/01/2019, 10/29/2019, 07/22/2020   PPD Test 09/03/2017   Zoster Recombinat (Shingrix) 11/07/2017, 11/07/2017    Conditions to be addressed/monitored: CAD, HTN, HLD, DMII, and tobacco use  Care Plan : Medication Management  Updates made by Gavin Pound, RPH since 05/19/2021 12:00 AM     Problem: T2DM, HTN, CAD/HLD, Tobacco Use   Priority:  High  Onset Date: 04/07/2021     Long-Range Goal: Disease Progression Prevention   Start Date: 04/07/2021  Expected End Date: 07/06/2021  Recent Progress: On track  Priority: High  Note:   Current Barriers:  Unable to independently monitor therapeutic efficacy  Pharmacist Clinical Goal(s):  Over the next 90 days, patient will Achieve adherence to monitoring guidelines and medication adherence to achieve therapeutic efficacy through collaboration with PharmD and provider.   Interventions: 1:1 collaboration with Anabel Halon,  MD regarding development and update of comprehensive plan of care as evidenced by provider attestation and co-signature Inter-disciplinary care team collaboration (see longitudinal plan of care) Comprehensive medication review performed; medication list updated in electronic medical record  Type 2 Diabetes: Current medications: metformin 1,000 mg by mouth twice daily with meals, semaglutide (Ozempic) 0.5 mg subcutaneously weekly, and insulin glargine (Basaglar) 28-30 units subcutaneously once daily and insulin aspart (Novolog) 5 units as needed for blood glucose >300 Intolerances: none Taking medications as directed: yes Side effects thought to be attributed to current medication regimen: no Reports 1 hypoglycemic event on 05/10/21 after patient took Novolog 10 units with blood glucose in the 200s. This dropped her blood glucose to the 50s and she had to treat it. Reminded patient to only use Novolog 5 units when blood glucose >300.  Hypoglycemia prevention: not indicated at this time Current meal patterns: breakfast: cereal and fruit; snacks: fruit; drinks: water, coffee without sugar, and diet green tea Current exercise: not discussed today On a statin: [x]  Yes  []  No    Last microalbumin: 0.9 (10/04/20); on an ACEi/ARB: [x]  Yes  []  No    Most recent glucose readings: patient reports fasting blood glucose of 97-116 per Dexcom G6 although reports higher fasting blood  glucose ~130-140s when checking finger sticks. Discussed differences between blood glucose and interstitial glucose. Controlled; Most recent A1c at goal of <7% per ADA guidelines Continue metformin 1,000 mg by mouth twice daily with meals Recommend to continue titrating Ozempic. Will need to decrease basal insulin requirements with each dose titration.  Continue monitoring blood glucose continuously with Dexcom G6. Will have patient return in 4 weeks to setup with [facility] Dexcom Clarity account and make adjustments to medications if necessary   Hypertension: Current medications: losartan 100 mg by mouth once daily and amlodipine 10 mg by mouth once daily Intolerances:  Ace inhibitor (cough) Taking medications as directed: yes Side effects thought to be attributed to current medication regimen: no Home blood pressure readings: 130s/60-70s per patient report Blood pressure under good control. Blood pressure is at goal of <130/80 mmHg per 2017 AHA/ACC guidelines. Continue losartan 100 mg by mouth once daily and amlodipine 10 mg by mouth once daily Encourage dietary sodium restriction/DASH diet Recommend regular aerobic exercise Recommend home blood pressure monitoring to discuss at next visit  Coronary artery disease/Hyperlipidemia: Current medications: atorvastatin 20 mg by mouth once daily Intolerances: none Taking medications as directed: yes Side effects thought to be attributed to current medication regimen: no Controlled; LDL at goal of <70 due to very high risk given diabetes + at least 1 additional major risk factor (hypertension and cigarette smoking) per 2020 AACE/ACE guidelines and TG at goal of <150 per 2020 AACE/ACE guidelines Continue atorvastatin 20 mg by mouth once daily Recommend regular aerobic exercise Smoking cessation is advised. Will discuss more at future visits  Patient Goals/Self-Care Activities Over the next 90 days, patient will:  Take medications as  prescribed Check blood sugar twice a day at the following times: fasting (at least 8 hours since last food consumption), bedtime, and whenever patient experiences symptoms of hypo/hyperglycemia, document, and provide at future appointments Check blood pressure at least once daily, document, and provide at future appointments Target a minimum of 150 minutes of moderate intensity exercise weekly Engage in dietary modifications by fewer sweetened foods & beverages  Follow Up Plan: Face to Face appointment with care management team member scheduled for: 06/22/21      Medication Assistance: None  required.  Patient affirms current coverage meets needs.  Patient's preferred pharmacy is:  [facility] [place], [place] - [place] [address] [place] [address] Phone: [phone] Fax: [phone]  [id number] [place] [place] - [address] [place] [address] Phone: [phone] Fax: [phone]  Follow Up:  Patient agrees to Care Plan and Follow-up.  Plan: Face to Face appointment with care management team member scheduled for: 06/22/21  Domenic Moras, PharmD Clinical Pharmacist [facility] [phone]

## 2021-05-23 ENCOUNTER — Encounter: Payer: Self-pay | Admitting: *Deleted

## 2021-05-23 NOTE — Telephone Encounter (Signed)
Pt has been advised of lab results 05-05-21

## 2021-05-25 ENCOUNTER — Telehealth: Payer: Medicare HMO

## 2021-05-28 DIAGNOSIS — E1140 Type 2 diabetes mellitus with diabetic neuropathy, unspecified: Secondary | ICD-10-CM | POA: Diagnosis not present

## 2021-05-30 ENCOUNTER — Other Ambulatory Visit: Payer: Self-pay | Admitting: Internal Medicine

## 2021-05-30 DIAGNOSIS — E1140 Type 2 diabetes mellitus with diabetic neuropathy, unspecified: Secondary | ICD-10-CM

## 2021-05-30 DIAGNOSIS — E785 Hyperlipidemia, unspecified: Secondary | ICD-10-CM

## 2021-06-06 DIAGNOSIS — Z794 Long term (current) use of insulin: Secondary | ICD-10-CM | POA: Diagnosis not present

## 2021-06-06 DIAGNOSIS — I2510 Atherosclerotic heart disease of native coronary artery without angina pectoris: Secondary | ICD-10-CM

## 2021-06-06 DIAGNOSIS — E1140 Type 2 diabetes mellitus with diabetic neuropathy, unspecified: Secondary | ICD-10-CM | POA: Diagnosis not present

## 2021-06-06 DIAGNOSIS — I10 Essential (primary) hypertension: Secondary | ICD-10-CM | POA: Diagnosis not present

## 2021-06-06 DIAGNOSIS — E785 Hyperlipidemia, unspecified: Secondary | ICD-10-CM | POA: Diagnosis not present

## 2021-06-07 ENCOUNTER — Other Ambulatory Visit: Payer: Self-pay | Admitting: Family Medicine

## 2021-06-07 DIAGNOSIS — E1140 Type 2 diabetes mellitus with diabetic neuropathy, unspecified: Secondary | ICD-10-CM

## 2021-06-07 DIAGNOSIS — Z794 Long term (current) use of insulin: Secondary | ICD-10-CM

## 2021-06-09 DIAGNOSIS — Z23 Encounter for immunization: Secondary | ICD-10-CM | POA: Diagnosis not present

## 2021-06-22 ENCOUNTER — Ambulatory Visit: Payer: Medicare HMO

## 2021-06-28 DIAGNOSIS — E1140 Type 2 diabetes mellitus with diabetic neuropathy, unspecified: Secondary | ICD-10-CM | POA: Diagnosis not present

## 2021-06-29 ENCOUNTER — Telehealth: Payer: Self-pay

## 2021-06-29 ENCOUNTER — Other Ambulatory Visit: Payer: Self-pay | Admitting: *Deleted

## 2021-06-29 MED ORDER — METFORMIN HCL ER 500 MG PO TB24: ORAL_TABLET | ORAL | 2 refills | Status: DC

## 2021-06-29 NOTE — Telephone Encounter (Signed)
[facility]

## 2021-06-29 NOTE — Telephone Encounter (Signed)
Patient called need med refill Metformin send into [facility] in [place] on [address].

## 2021-07-05 ENCOUNTER — Ambulatory Visit (INDEPENDENT_AMBULATORY_CARE_PROVIDER_SITE_OTHER): Payer: Medicare HMO | Admitting: Pharmacist

## 2021-07-05 DIAGNOSIS — I2510 Atherosclerotic heart disease of native coronary artery without angina pectoris: Secondary | ICD-10-CM

## 2021-07-05 DIAGNOSIS — Z720 Tobacco use: Secondary | ICD-10-CM

## 2021-07-05 DIAGNOSIS — E785 Hyperlipidemia, unspecified: Secondary | ICD-10-CM

## 2021-07-05 DIAGNOSIS — E1140 Type 2 diabetes mellitus with diabetic neuropathy, unspecified: Secondary | ICD-10-CM

## 2021-07-05 DIAGNOSIS — Z794 Long term (current) use of insulin: Secondary | ICD-10-CM

## 2021-07-05 DIAGNOSIS — I10 Essential (primary) hypertension: Secondary | ICD-10-CM

## 2021-07-05 NOTE — Patient Instructions (Signed)
[name],  It was great to talk to you today!  Please call me with any questions or concerns.   Visit Information  Following are the goals we discussed today:  Patient Goals/Self-Care Activities Over the next 90 days, patient will:  Take medications as prescribed Check blood sugar twice a day at the following times: fasting (at least 8 hours since last food consumption), bedtime, and whenever patient experiences symptoms of hypo/hyperglycemia, document, and provide at future appointments Check blood pressure at least once daily, document, and provide at future appointments Target a minimum of 150 minutes of moderate intensity exercise weekly Engage in dietary modifications by fewer sweetened foods & beverages  Plan: Telephone follow up appointment with care management team member scheduled for:  07/27/21  Kennon Holter, PharmD, [place] Clinical Pharmacist [place] [facility] [phone]   Please call the care guide team at [phone] if you need to cancel or reschedule your appointment.   The patient verbalized understanding of instructions, educational materials, and care plan provided today and agreed to receive a mailed copy of patient instructions, educational materials, and care plan.

## 2021-07-05 NOTE — Telephone Encounter (Signed)
ERROR

## 2021-07-05 NOTE — Chronic Care Management (AMB) (Signed)
Chronic Care Management Pharmacy Note  07/05/2021 Name:  Stephanie Hammond MRN:  [id number] DOB:  02-17-1951  Summary:  Type 2 Diabetes: Patient reports she was not sure if she was supposed to be taking Ozempic 0.25 mg or 0.5 mg weekly so she has been alternating doses each week. Instructed patient to take Ozempic 0.5 mg subcutaneously once weekly Reports no hypoglycemic events since we last spoke. Patient reports she has noticed her blood glucose is spiking after eating cereal and fruit for breakfast. Plans to start eating boiled egg or greek yogurt instead Most recent glucose readings: patient reports blood glucose between 146-265 over last couple weeks Continue monitoring blood glucose continuously with Dexcom G6. 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 Unable to add her to [facility] Clarity clinic account today since patient did not know her e-mail address. Will mail invitation code to patient who will have a family member help her share he blood glucose data with our clinic. Will have patient return in 3 weeks to review Dexcom data and make adjustments to medications if necessary   Coronary artery disease/Hyperlipidemia: Smoking cessation is advised. Will discuss more at future visits  Subjective: Stephanie Hammond is an 70 y.o. year old female who is a primary patient of Anabel Halon, MD.  The CCM team was consulted for assistance with disease management and care coordination needs.    Engaged with patient by telephone for follow up visit in response to provider referral for pharmacy case management and/or care coordination services.   Consent to Services:  The patient was given information about Chronic Care Management services, agreed to services, and gave verbal consent prior to initiation of services.  Please see initial visit note for detailed documentation.   Patient Care Team: Anabel Halon, MD as PCP - General (Internal Medicine) [place] Bali, MD (Inactive)  as Consulting Physician (Gastroenterology) Gavin Pound, [facility] (Pharmacist)  Objective:  Lab Results  Component Value Date   CREATININE 0.84 05/04/2021   CREATININE 0.87 03/19/2020   CREATININE 0.87 01/21/2020    Lab Results  Component Value Date   HGBA1C 5.4 05/04/2021   Last diabetic Eye exam:  Lab Results  Component Value Date/Time   HMDIABEYEEXA No Retinopathy 11/19/2019 12:00 AM    Last diabetic Foot exam: No results found for: HMDIABFOOTEX      Component Value Date/Time   CHOL 101 05/04/2021 1023   TRIG 69 05/04/2021 1023   HDL 44 05/04/2021 1023   CHOLHDL 2.3 05/04/2021 1023   CHOLHDL 2.5 03/19/2020 1127   VLDL 27 09/11/2016 1255   LDLCALC 42 05/04/2021 1023   LDLCALC CANCELED 10/04/2020 1301   LDLDIRECT 68 12/01/2013 1454    Hepatic Function Latest Ref Rng & Units 05/04/2021 03/19/2020 05/13/2019  Total Protein 6.0 - 8.5 g/dL 7.1 6.7 7.2  Albumin 3.8 - 4.8 g/dL 4.3 - -  AST 0 - 40 IU/L 17 12 15   ALT 0 - 32 IU/L 18 10 14   Alk Phosphatase 44 - 121 IU/L 162(H) - -  Total Bilirubin 0.0 - 1.2 mg/dL <0.8 0.3 0.3    Lab Results  Component Value Date/Time   TSH 0.588 05/04/2021 10:23 AM   TSH 0.75 09/10/2017 03:01 PM    CBC Latest Ref Rng & Units 05/04/2021 10/04/2020 05/03/2020  WBC 3.4 - 10.8 x10E3/uL 6.2 CANCELED 6.2  Hemoglobin 11.1 - 15.9 g/dL 65.7 - 10.2(L)  Hematocrit 34.0 - 46.6 % 35.7 - 31.4(L)  Platelets 150 -  450 x10E3/uL 339 - 373    No results found for: VD25OH  Clinical ASCVD: No  The ASCVD Risk score (Arnett DK, et al., 2019) failed to calculate for the following reasons:   The valid total cholesterol range is 130 to 320 mg/dL    Social History   Tobacco Use  Smoking Status Every Day   Packs/day: 1.00   Years: 35.00   Pack years: 35.00   Types: Cigarettes  Smokeless Tobacco Never   BP Readings from Last 3 Encounters:  03/22/21 125/74  10/25/20 121/70  10/04/20 138/64   Pulse Readings from Last 3 Encounters:  03/22/21 91   10/25/20 (!) 111  10/04/20 100   Wt Readings from Last 3 Encounters:  03/22/21 208 lb 1.3 oz (94.4 kg)  10/25/20 208 lb 1.9 oz (94.4 kg)  10/04/20 209 lb (94.8 kg)    Assessment: Review of patient past medical history, allergies, medications, health status, including review of consultants reports, laboratory and other test data, was performed as part of comprehensive evaluation and provision of chronic care management services.   SDOH:  (Social Determinants of Health) assessments and interventions performed:    CCM Care Plan  Allergies  Allergen Reactions   Ace Inhibitors Cough   Bentyl [Dicyclomine Hcl] Other (See Comments)    Blurry vision   Penicillins Itching and Swelling    Whelps Did it involve swelling of the face/tongue/throat, SOB, or low BP? No Did it involve sudden or severe rash/hives, skin peeling, or any reaction on the inside of your mouth or nose? No Did you need to seek medical attention at a hospital or doctor's office? No When did it last happen?      20 years If all above answers are "NO", may proceed with cephalosporin use.     Medications Reviewed Today     Reviewed by Gavin Pound, [facility] (Pharmacist) on 07/05/21 at 1058  Med List Status: <None>   Medication Order Taking? Sig Documenting Provider Last Dose Status Informant  allopurinol (ZYLOPRIM) 300 MG tablet [id number] Yes TAKE ONE TABLET BY MOUTH EVERY DAY Anabel Halon, MD Taking Active   amLODipine (NORVASC) 10 MG tablet [id number] Yes TAKE ONE TABLET BY MOUTH ONCE DAILY Anabel Halon, MD Taking Active   atorvastatin (LIPITOR) 20 MG tablet [id number] Yes TAKE ONE TABLET BY MOUTH ONCE DAILY Anabel Halon, MD Taking Active   Blood Glucose Monitoring Suppl (BLOOD GLUCOSE SYSTEM PAK) KIT [id number]  Please dispense based on patient and insurance preference. Use as directed to monitor FSBS 3x daily. Dx: E11.9 Donita Brooks, MD  Active   buprenorphine Lavera Guise) 10 MCG/HR [place] [id number] Yes  Place 1 patch onto the skin once a week. [provider] Taking Active   Continuous Blood Gluc Sensor (DEXCOM G6 SENSOR) MISC [id number] Yes Apply new sensor every 10 days [provider] Taking Active Self  Continuous Blood Gluc Transmit (DEXCOM G6 TRANSMITTER) MISC [id number] Yes Use 1 transmitter every 90 days [provider] Taking Active Self  DULoxetine (CYMBALTA) 60 MG capsule [id number] Yes Take 60 mg by mouth daily. [provider] Taking Active   Glucose Blood (BLOOD GLUCOSE TEST STRIPS) STRP [id number]  Please dispense based on patient and insurance preference. Use as directed to monitor FSBS 3x daily. Dx: E11.9  For testing 4 times daily Anabel Halon, MD  Active   insulin aspart (NOVOLOG) 100 UNIT/ML injection [id number] Yes Inject 5 Units into the skin as needed for high blood sugar (  BG >300). [provider] Taking Active   Insulin Glargine (BASAGLAR KWIKPEN) 100 UNIT/ML [id number] Yes Inject 25 Units into the skin at bedtime.  Patient taking differently: Inject 28-30 Units into the skin at bedtime.   Hundred, Velna Hatchet, MD Taking Active   Insulin Pen Needle (B-D UF III MINI PEN NEEDLES) 31G X 5 MM MISC [id number]  FOR USE with INSULIN PENS Donita Brooks, MD  Active   Lancets MISC [id number]  Please dispense based on patient and insurance preference. Use as directed to monitor FSBS 3x daily. Dx: E11.9  For testing 4 times daily Anabel Halon, MD  Active   losartan (COZAAR) 100 MG tablet [id number] Yes TAKE ONE TABLET BY MOUTH ONCE DAILY Anabel Halon, MD Taking Active   metFORMIN (GLUCOPHAGE-XR) 500 MG 24 hr tablet [id number] Yes TAKE 2 TABLETS(1000 MG) BY MOUTH DAILY WITH BREAKFAST Anabel Halon, MD Taking Active   omeprazole (PRILOSEC) 20 MG capsule [id number] Yes TAKE 1 CAPSULE BY MOUTH EVERY DAY IN THE [place], Velna Hatchet, MD Taking Active   oxyCODONE-acetaminophen (PERCOCET) 10-325 MG tablet [id number] Yes Take 1 tablet by mouth  2 (two) times daily. [provider] Taking Active   Semaglutide,0.25 or 0.5MG /DOS, 2 MG/1.5ML SOPN [id number] Yes Inject 0.5 mg into the skin every 7 (seven) days. [provider] Taking Active             Patient Active Problem List   Diagnosis Date Noted   Encounter for general adult medical examination with abnormal findings 03/22/2021   Hardening of the aorta (main artery of the heart) (HCC) 10/25/2020   Chronic pain syndrome 10/25/2020   Insomnia 08/18/2020   Colovaginal fistula 05/13/2019   History of diverticulitis 03/19/2019   Failed total knee, left, initial encounter (HCC) 01/09/2019   Anemia 12/31/2015   Arthritis of knee, degenerative 03/12/2015   Arthritis of left knee    Gout 09/07/2014   Back pain with left-sided radiculopathy 09/07/2014   Type 2 diabetes mellitus with diabetic neuropathy (HCC) 02/14/2014   Hyperlipidemia 03/12/2013   IBS (irritable bowel syndrome) 01/09/2013   Diabetic neuropathy (HCC) 11/14/2012   Major depression, single episode 09/13/2012   Essential hypertension, benign 06/09/2012   Sinus tachycardia 03/28/2012   DDD (degenerative disc disease), lumbar 03/28/2012   Chronic knee pain 03/28/2012   Obesity (BMI 30-39.9) 03/28/2012   Tobacco abuse 03/28/2012   CAD (coronary artery disease) 03/28/2012    Immunization History  Administered Date(s) Administered   Moderna Sars-Covid-2 Vaccination 10/01/2019, 10/29/2019, 07/22/2020   PPD Test 09/03/2017   Zoster Recombinat (Shingrix) 11/07/2017, 11/07/2017    Conditions to be addressed/monitored: CAD, HTN, HLD, DMII, and Tobacco Use  Care Plan : Medication Management  Updates made by Gavin Pound, RPH since 07/05/2021 12:00 AM     Problem: T2DM, HTN, CAD/HLD, Tobacco Use   Priority: High  Onset Date: 04/07/2021     Long-Range Goal: Disease Progression Prevention   Start Date: 04/07/2021  Expected End Date: 07/06/2021  Recent Progress: On track  Priority:  High  Note:   Current Barriers:  Unable to independently monitor therapeutic efficacy  Pharmacist Clinical Goal(s):  Over the next 90 days, patient will Achieve adherence to monitoring guidelines and medication adherence to achieve therapeutic efficacy through collaboration with PharmD and provider.   Interventions: 1:1 collaboration with Anabel Halon, MD regarding development and update of comprehensive plan of care as evidenced by provider attestation and co-signature Inter-disciplinary care team collaboration (see longitudinal plan  of care) Comprehensive medication review performed; medication list updated in electronic medical record  Type 2 Diabetes: Current medications: metformin 1,000 mg by mouth twice daily with meals, semaglutide (Ozempic) 0.5 mg subcutaneously weekly, and insulin glargine (Basaglar) 25-30 units subcutaneously once daily and insulin aspart (Novolog) 5 units as needed for blood glucose >300 Intolerances: none Taking medications as directed: no, patient reports she was not sure if she was supposed to be taking Ozempic 0.25 mg or 0.5 mg weekly so she has been alternating doses each week Side effects thought to be attributed to current medication regimen: no Reports no hypoglycemic events since we last spoke. Hypoglycemia prevention: not indicated at this time Current meal patterns: breakfast: cereal and fruit; snacks: fruit; drinks: water, coffee without sugar, and diet green tea Patient reports she has noticed her blood glucose is spiking after eating cereal and fruit for breakfast. Plans to start eating boiled egg or greek yogurt instead Current exercise: not discussed today On a statin: [x]  Yes  []  No    Last microalbumin: 0.9 (10/04/20); on an ACEi/ARB: [x]  Yes  []  No    Most recent glucose readings: patient reports blood glucose between 146-265 over last couple weeks Controlled; Most recent A1c at goal of <7% per ADA guidelines Continue metformin 1,000 mg by  mouth twice daily with meals Continue monitoring blood glucose continuously with Dexcom G6. 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 Unable to add her to [facility] Clarity clinic account today since patient did not know her e-mail address. Will mail invitation code to patient who will have a family member help her share he blood glucose data with our clinic. Will have patient return in 3 weeks to review Dexcom data and make adjustments to medications if necessary  Instructed patient to take Ozempic 0.5 mg subcutaneously once weekly  Hypertension: Current medications: losartan 100 mg by mouth once daily and amlodipine 10 mg by mouth once daily Intolerances:  Ace inhibitor (cough) Taking medications as directed: yes Side effects thought to be attributed to current medication regimen: no Home blood pressure readings: stable at 130s/60-70s per patient report Blood pressure under good control. Blood pressure is at goal of <130/80 mmHg per 2017 AHA/ACC guidelines. Continue losartan 100 mg by mouth once daily and amlodipine 10 mg by mouth once daily Encourage dietary sodium restriction/DASH diet Recommend regular aerobic exercise Recommend home blood pressure monitoring to discuss at next visit  Coronary artery disease/Hyperlipidemia: Current medications: atorvastatin 20 mg by mouth once daily Intolerances: none Taking medications as directed: yes Side effects thought to be attributed to current medication regimen: no Controlled; LDL at goal of <70 due to very high risk given diabetes + at least 1 additional major risk factor (hypertension and cigarette smoking) per 2020 AACE/ACE guidelines and TG at goal of <150 per 2020 AACE/ACE guidelines Continue atorvastatin 20 mg by mouth once daily Recommend regular aerobic exercise Smoking cessation is advised. Will discuss more at future visits  Patient Goals/Self-Care Activities Over the next 90 days, patient will:  Take medications as  prescribed Check blood sugar twice a day at the following times: fasting (at least 8 hours since last food consumption), bedtime, and whenever patient experiences symptoms of hypo/hyperglycemia, document, and provide at future appointments Check blood pressure at least once daily, document, and provide at future appointments Target a minimum of 150 minutes of moderate intensity exercise weekly Engage in dietary modifications by fewer sweetened foods & beverages  Follow Up Plan: Face to Face appointment with  care management team member scheduled for: 07/27/21      Medication Assistance: None required.  Patient affirms current coverage meets needs.  Patient's preferred pharmacy is:  [facility] [place], [place] - [place] [address] [address] Phone: [phone] Fax: [phone]  [id number] - [place], [place] - [address] [place] [address] Phone: [phone] Fax: [phone]  Follow Up:  Patient agrees to Care Plan and Follow-up.  Plan: Telephone follow up appointment with care management team member scheduled for:  07/27/21  Domenic Moras, PharmD, [facility] Clinical Pharmacist [facility] [phone]

## 2021-07-06 DIAGNOSIS — I10 Essential (primary) hypertension: Secondary | ICD-10-CM

## 2021-07-06 DIAGNOSIS — E1140 Type 2 diabetes mellitus with diabetic neuropathy, unspecified: Secondary | ICD-10-CM

## 2021-07-06 DIAGNOSIS — I2510 Atherosclerotic heart disease of native coronary artery without angina pectoris: Secondary | ICD-10-CM | POA: Diagnosis not present

## 2021-07-06 DIAGNOSIS — Z794 Long term (current) use of insulin: Secondary | ICD-10-CM | POA: Diagnosis not present

## 2021-07-06 DIAGNOSIS — E785 Hyperlipidemia, unspecified: Secondary | ICD-10-CM | POA: Diagnosis not present

## 2021-07-14 DIAGNOSIS — M542 Cervicalgia: Secondary | ICD-10-CM | POA: Diagnosis not present

## 2021-07-14 DIAGNOSIS — M25561 Pain in right knee: Secondary | ICD-10-CM | POA: Diagnosis not present

## 2021-07-14 DIAGNOSIS — M25562 Pain in left knee: Secondary | ICD-10-CM | POA: Diagnosis not present

## 2021-07-14 DIAGNOSIS — M5451 Vertebrogenic low back pain: Secondary | ICD-10-CM | POA: Diagnosis not present

## 2021-07-14 DIAGNOSIS — Z79891 Long term (current) use of opiate analgesic: Secondary | ICD-10-CM | POA: Diagnosis not present

## 2021-07-22 ENCOUNTER — Ambulatory Visit: Payer: Medicare HMO | Admitting: Internal Medicine

## 2021-07-27 ENCOUNTER — Ambulatory Visit (INDEPENDENT_AMBULATORY_CARE_PROVIDER_SITE_OTHER): Payer: Medicare HMO | Admitting: Pharmacist

## 2021-07-27 DIAGNOSIS — E785 Hyperlipidemia, unspecified: Secondary | ICD-10-CM

## 2021-07-27 DIAGNOSIS — I2510 Atherosclerotic heart disease of native coronary artery without angina pectoris: Secondary | ICD-10-CM

## 2021-07-27 DIAGNOSIS — I10 Essential (primary) hypertension: Secondary | ICD-10-CM

## 2021-07-27 DIAGNOSIS — E1140 Type 2 diabetes mellitus with diabetic neuropathy, unspecified: Secondary | ICD-10-CM

## 2021-07-27 DIAGNOSIS — Z720 Tobacco use: Secondary | ICD-10-CM

## 2021-07-27 NOTE — Patient Instructions (Signed)
[name],  It was great to talk to you today!  Please call me with any questions or concerns.   Visit Information  Following are the goals we discussed today:  Patient Goals/Self-Care Activities Over the next 90 days, patient will:  Take medications as prescribed Check blood sugar continuously with continuous glucose monitor, document, and provide at future appointments Check blood pressure at least once daily, document, and provide at future appointments Target a minimum of 150 minutes of moderate intensity exercise weekly Engage in dietary modifications by fewer sweetened foods & beverages Work on stopping or reducing smoking  Plan: Telephone follow up appointment with care management team member scheduled for:  08/24/21  Kennon Holter, PharmD, BCACP, CPP Clinical Pharmacist Practitioner [facility] [phone]  Please call the care guide team at [phone] if you need to cancel or reschedule your appointment.   Patient verbalizes understanding of instructions provided today and agrees to view in [place].

## 2021-07-27 NOTE — Chronic Care Management (AMB) (Signed)
Chronic Care Management Pharmacy Note  07/27/2021 Name:  Stephanie Hammond MRN:  [id number] DOB:  February 16, 1951  Summary: Type 2 Diabetes Most recent glucose readings: see Dexcom Clarity reports below for last 30 days. 83% of the time in target range. 3% below target range and 14% above target range. Several low blood glucose events recorded on Dexcom last week but patient reports that when she checked finger stick blood glucose it was in the 90s so she did not need to treat for hypoglycemia. Patient reports that she thinks her current sensor is expired or malfunctioning. She has called [facility] and they are sending her new sensors that should arrive within the next week. Continue monitoring blood glucose continuously with Dexcom G6 unless symptoms do not match sensor glucose then check ginger stick blood glucose.  Continue metformin 1,000 mg every morning with breakfast Due to some potentially low blood glucose events, improvement in diet, and tight blood glucose control, patient instructed to decrease Basaglar to 25 units at bedtime. Patient also instructed that if blood glucose is <110 at bedtime then only inject 15 units at bedtime.  Instructed patient to take Ozempic 0.5 mg subcutaneously once weekly          Tobacco Abuse (Current Smoker) Patient endorses that she currently smokes ~20 cigarettes per day Advised patient to quit smoking and offered support Established a goal quit date of 08/07/21 per patient. Reviewed strategies to maximize success. Commended for decision Patient plans to check with her insurance company to see if they cover any nicotine replacement products. If not, patient was counseled on the use of nicotine patches and that she can purchase them over the counter May consider Chantix or Wellbutrin as well if patient cannot stop smoking with nicotine replacement therapy.  Subjective: SHANOVIA BUSCHMANN is an 70 y.o. year old female who is a primary  patient of Anabel Halon, MD.  The CCM team was consulted for assistance with disease management and care coordination needs.    Engaged with patient by telephone for follow up visit in response to provider referral for pharmacy case management and/or care coordination services.   Consent to Services:  The patient was given information about Chronic Care Management services, agreed to services, and gave verbal consent prior to initiation of services.  Please see initial visit note for detailed documentation.   Patient Care Team: Anabel Halon, MD as PCP - General (Internal Medicine) [place] Bali, MD (Inactive) as Consulting Physician (Gastroenterology) Gavin Pound, [facility] (Pharmacist)  Objective:  Lab Results  Component Value Date   CREATININE 0.84 05/04/2021   CREATININE 0.87 03/19/2020   CREATININE 0.87 01/21/2020    Lab Results  Component Value Date   HGBA1C 5.4 05/04/2021   Last diabetic Eye exam:  Lab Results  Component Value Date/Time   HMDIABEYEEXA No Retinopathy 11/19/2019 12:00 AM    Last diabetic Foot exam: No results found for: HMDIABFOOTEX      Component Value Date/Time   CHOL 101 05/04/2021 1023   TRIG 69 05/04/2021 1023   HDL 44 05/04/2021 1023   CHOLHDL 2.3 05/04/2021 1023   CHOLHDL 2.5 03/19/2020 1127   VLDL 27 09/11/2016 1255   LDLCALC 42 05/04/2021 1023   LDLCALC CANCELED 10/04/2020 1301   LDLDIRECT 68 12/01/2013 1454    Hepatic Function Latest Ref Rng & Units 05/04/2021 03/19/2020 05/13/2019  Total Protein 6.0 - 8.5 g/dL 7.1 6.7 7.2  Albumin 3.8 - 4.8 g/dL 4.3 - -  AST 0 - 40 IU/L 17 12 15   ALT 0 - 32 IU/L 18 10 14   Alk Phosphatase 44 - 121 IU/L 162(H) - -  Total Bilirubin 0.0 - 1.2 mg/dL <7.8 0.3 0.3    Lab Results  Component Value Date/Time   TSH 0.588 05/04/2021 10:23 AM   TSH 0.75 09/10/2017 03:01 PM    CBC Latest Ref Rng & Units 05/04/2021 10/04/2020 05/03/2020  WBC 3.4 - 10.8 x10E3/uL 6.2 CANCELED 6.2  Hemoglobin 11.1 -  15.9 g/dL 29.5 - 10.2(L)  Hematocrit 34.0 - 46.6 % 35.7 - 31.4(L)  Platelets 150 - 450 x10E3/uL 339 - 373    No results found for: VD25OH  Clinical ASCVD: No  The ASCVD Risk score (Arnett DK, et al., 2019) failed to calculate for the following reasons:   The valid total cholesterol range is 130 to 320 mg/dL    Social History   Tobacco Use  Smoking Status Every Day   Packs/day: 1.00   Years: 35.00   Pack years: 35.00   Types: Cigarettes  Smokeless Tobacco Never   BP Readings from Last 3 Encounters:  03/22/21 125/74  10/25/20 121/70  10/04/20 138/64   Pulse Readings from Last 3 Encounters:  03/22/21 91  10/25/20 (!) 111  10/04/20 100   Wt Readings from Last 3 Encounters:  03/22/21 208 lb 1.3 oz (94.4 kg)  10/25/20 208 lb 1.9 oz (94.4 kg)  10/04/20 209 lb (94.8 kg)    Assessment: Review of patient past medical history, allergies, medications, health status, including review of consultants reports, laboratory and other test data, was performed as part of comprehensive evaluation and provision of chronic care management services.   SDOH:  (Social Determinants of Health) assessments and interventions performed:    CCM Care Plan  Allergies  Allergen Reactions   Ace Inhibitors Cough   Bentyl [Dicyclomine Hcl] Other (See Comments)    Blurry vision   Penicillins Itching and Swelling    [place]helps Did it involve swelling of the face/tongue/throat, SOB, or low BP? No Did it involve sudden or severe rash/hives, skin peeling, or any reaction on the inside of your mouth or nose? No Did you need to seek medical attention at a hospital or doctor's office? No When did it last happen?      20 years If all above answers are NO, may proceed with cephalosporin use.     Medications Reviewed Today     Reviewed by Gavin Pound, [facility] (Pharmacist) on 07/27/21 at 1145  Med List Status: <None>   Medication Order Taking? Sig Documenting Provider Last Dose Status Informant   allopurinol (ZYLOPRIM) 300 MG tablet [id number]  TAKE ONE TABLET BY MOUTH EVERY DAY Anabel Halon, MD  Active   amLODipine (NORVASC) 10 MG tablet [id number] Yes TAKE ONE TABLET BY MOUTH ONCE DAILY Anabel Halon, MD Taking Active   atorvastatin (LIPITOR) 20 MG tablet [id number] Yes TAKE ONE TABLET BY MOUTH ONCE DAILY Anabel Halon, MD Taking Active   Blood Glucose Monitoring Suppl (BLOOD GLUCOSE SYSTEM PAK) KIT [id number]  Please dispense based on patient and insurance preference. Use as directed to monitor FSBS 3x daily. Dx: E11.9 Donita Brooks, MD  Active   buprenorphine Lavera Guise) 10 MCG/HR [place] [id number] Yes Place 1 patch onto the skin once a week. [provider] Taking Active   Continuous Blood Gluc Sensor (DEXCOM G6 SENSOR) MISC [id number] Yes Apply new sensor every 10 days [provider] Taking Active Self  Continuous  Blood Gluc Transmit (DEXCOM G6 TRANSMITTER) MISC [id number] Yes Use 1 transmitter every 90 days [provider] Taking Active Self  DULoxetine (CYMBALTA) 60 MG capsule [id number] Yes Take 60 mg by mouth daily. [provider] Taking Active   Glucose Blood (BLOOD GLUCOSE TEST STRIPS) STRP [id number]  Please dispense based on patient and insurance preference. Use as directed to monitor FSBS 3x daily. Dx: E11.9  For testing 4 times daily Anabel Halon, MD  Active   insulin aspart (NOVOLOG) 100 UNIT/ML injection [id number] Yes Inject 5 Units into the skin as needed for high blood sugar (BG >300). [provider] Taking Active   Insulin Glargine (BASAGLAR KWIKPEN) 100 UNIT/ML [id number] Yes Inject 25 Units into the skin at bedtime.  Patient taking differently: Inject 28-30 Units into the skin at bedtime.   [place], Velna Hatchet, MD Taking Active   Insulin Pen Needle (B-D UF III MINI PEN NEEDLES) 31G X 5 MM MISC [id number]  FOR USE with INSULIN PENS Donita Brooks, MD  Active   Lancets MISC [id number]  Please dispense based on patient and  insurance preference. Use as directed to monitor FSBS 3x daily. Dx: E11.9  For testing 4 times daily Anabel Halon, MD  Active   losartan (COZAAR) 100 MG tablet [id number] Yes TAKE ONE TABLET BY MOUTH ONCE DAILY Anabel Halon, MD Taking Active   metFORMIN (GLUCOPHAGE-XR) 500 MG 24 hr tablet [id number] Yes TAKE 2 TABLETS(1000 MG) BY MOUTH DAILY WITH BREAKFAST Anabel Halon, MD Taking Active   NURTEC 75 MG TBDP [id number] Yes Take 1 tablet by mouth daily as needed. [provider] Taking Active   omeprazole (PRILOSEC) 20 MG capsule [id number] Yes TAKE 1 CAPSULE BY MOUTH EVERY DAY IN THE [place], Velna Hatchet, MD Taking Active   oxyCODONE-acetaminophen (PERCOCET) 10-325 MG tablet [id number] Yes Take 1 tablet by mouth 2 (two) times daily. [provider] Taking Active   Semaglutide,0.25 or 0.5MG /DOS, 2 MG/1.5ML SOPN [id number] Yes Inject 0.5 mg into the skin every 7 (seven) days. [provider] Taking Active            Med Note Lourena Simmonds Jul 27, 2021 11:45 AM) Patient reports taking 0.25 mg weekly on Wednesday or Thursday            Patient Active Problem List   Diagnosis Date Noted   Encounter for general adult medical examination with abnormal findings 03/22/2021   Hardening of the aorta (main artery of the heart) (HCC) 10/25/2020   Chronic pain syndrome 10/25/2020   Insomnia 08/18/2020   Colovaginal fistula 05/13/2019   History of diverticulitis 03/19/2019   Failed total knee, left, initial encounter (HCC) 01/09/2019   Anemia 12/31/2015   Arthritis of knee, degenerative 03/12/2015   Arthritis of left knee    Gout 09/07/2014   Back pain with left-sided radiculopathy 09/07/2014   Type 2 diabetes mellitus with diabetic neuropathy (HCC) 02/14/2014   Hyperlipidemia 03/12/2013   IBS (irritable bowel syndrome) 01/09/2013   Diabetic neuropathy (HCC) 11/14/2012   Major depression, single episode 09/13/2012   Essential hypertension, benign  06/09/2012   Sinus tachycardia 03/28/2012   DDD (degenerative disc disease), lumbar 03/28/2012   Chronic knee pain 03/28/2012   Obesity (BMI 30-39.9) 03/28/2012   Tobacco abuse 03/28/2012   CAD (coronary artery disease) 03/28/2012    Immunization History  Administered Date(s) Administered   Moderna Sars-Covid-2 Vaccination 10/01/2019, 10/29/2019, 07/22/2020   PPD Test 09/03/2017   Zoster  Recombinat (Shingrix) 11/07/2017, 11/07/2017    Conditions to be addressed/monitored: CAD, HTN, HLD, DMII, and Tobacco Use  Care Plan : Medication Management  Updates made by Gavin Pound, RPH since 07/27/2021 12:00 AM     Problem: T2DM, HTN, CAD/HLD, Tobacco Use   Priority: High  Onset Date: 04/07/2021     Long-Range Goal: Disease Progression Prevention   Start Date: 04/07/2021  Expected End Date: 07/06/2021  Recent Progress: On track  Priority: High  Note:   Current Barriers:  Unable to independently monitor therapeutic efficacy  Pharmacist Clinical Goal(s):  Over the next 90 days, patient will Achieve adherence to monitoring guidelines and medication adherence to achieve therapeutic efficacy through collaboration with PharmD and provider.   Interventions: 1:1 collaboration with Anabel Halon, MD regarding development and update of comprehensive plan of care as evidenced by provider attestation and co-signature Inter-disciplinary care team collaboration (see longitudinal plan of care) Comprehensive medication review performed; medication list updated in electronic medical record  Type 2 Diabetes - goal on track (progressing): YES: Controlled; Most recent A1c at goal of <7% per ADA guidelines Current medications: metformin 1,000 mg by mouth with breakfast, semaglutide (Ozempic) 0.5 mg subcutaneously weekly, and insulin glargine (Basaglar) 25-30 units subcutaneously once daily and insulin aspart (Novolog) 5 units as needed for blood glucose >300 Intolerances: none Taking  medications as directed: no, patient continue to report using Ozempic 0.25 mg weekly Side effects thought to be attributed to current medication regimen: no Hypoglycemia prevention: not indicated at this time Current meal patterns: breakfast: patient reports she has cut our cereal and fruit for breakfast and is now eating a boiled egg and yogurt.  Current exercise: not discussed today On a statin: [x]  Yes  []  No    Last microalbumin: 0.9 (10/04/20); on an ACEi/ARB: [x]  Yes  []  No    Most recent glucose readings: see Dexcom Clarity reports below for last 30 days. 83% of the time in target range. 3% below target range and 14% above target range. Several low blood glucose events recorded on Dexcom last week but patient reports that when she checked finger stick blood glucose it was in the 90s so she did not need to treat for hypoglycemia. Patient reports that she thinks her current sensor is expired or malfunctioning. She has called [facility] and they are sending her new sensors that should arrive within the next week. Continue monitoring blood glucose continuously with Dexcom G6 unless symptoms do not match sensor glucose then check ginger stick blood glucose.  Continue metformin 1,000 mg every morning with breakfast Due to some potentially low blood glucose events, improvement in diet, and tight blood glucose control, patient instructed to decrease Basaglar to 25 units at bedtime. Patient also instructed that if blood glucose is <110 at bedtime then only inject 15 units at bedtime.  Instructed patient to take Ozempic 0.5 mg subcutaneously once weekly          Hypertension - condition stable not addressed at this visit: Blood pressure under good control. Blood pressure is at goal of <130/80 mmHg per 2017 AHA/ACC guidelines. Patient does report that he blood pressure was elevated at another recent office visit (outside [facility]) but she was stressed and thinks this was the  cause Current medications: losartan 100 mg by mouth once daily and amlodipine 10 mg by mouth once daily Intolerances:  Ace inhibitor (cough) Taking medications as directed: yes Side effects thought to be attributed to current medication regimen: no  Home blood pressure readings: stable at 130s/60-70s per patient report Continue losartan 100 mg by mouth once daily and amlodipine 10 mg by mouth once daily Encourage dietary sodium restriction/DASH diet Recommend regular aerobic exercise Recommend home blood pressure monitoring to discuss at next visit  Coronary artery disease/Hyperlipidemia  - condition stable not addressed at this visit: Controlled; LDL at goal of <70 due to very high risk given diabetes + at least 1 additional major risk factor (hypertension and cigarette smoking) per 2020 AACE/ACE guidelines and TG at goal of <150 per 2020 AACE/ACE guidelines Current medications: atorvastatin 20 mg by mouth once daily Intolerances: none Taking medications as directed: yes Side effects thought to be attributed to current medication regimen: no Continue atorvastatin 20 mg by mouth once daily Recommend regular aerobic exercise Smoking cessation is advised. Offered support.   Tobacco Abuse (Current Smoker) - New goal.: Patient endorses that she currently smokes ~20 cigarettes per day Previous quit attempts: successful quitting cold [place] but has frequent relapses; patient does not think she has ever tried pharmacologic therapy to assist her; has spoken with [facility] [place] in the past and she states "they drove me crazy calling me all the time" Current medication management: none Known triggers: stress Barriers: under a lot of stress now Advised patient to quit smoking and offered support Established a goal quit date of 08/07/21 per patient. Reviewed strategies to maximize success. Commended for decision Patient plans to check with her insurance company to see if they cover any nicotine  replacement products. If not, patient was counseled on the use of nicotine patches and that she can purchase them over the counter May consider Chantix or Wellbutrin as well if patient cannot stop smoking with nicotine replacement therapy.  Patient Goals/Self-Care Activities Over the next 90 days, patient will:  Take medications as prescribed Check blood sugar continuously with continuous glucose monitor, document, and provide at future appointments Check blood pressure at least once daily, document, and provide at future appointments Target a minimum of 150 minutes of moderate intensity exercise weekly Engage in dietary modifications by fewer sweetened foods & beverages Work on stopping or reducing smoking  Follow Up Plan: Face to Face appointment with care management team member scheduled for: 08/24/21      Medication Assistance: None required.  Patient affirms current coverage meets needs.  Patient's preferred pharmacy is:  [facility] [place], [place] - [place] [address] [place] [address] Phone: [phone] Fax: [phone]  [id number] - [place], [place] - [address] [place] [address] Phone: [phone] Fax: [phone]  Follow Up:  Patient agrees to Care Plan and Follow-up.  Plan: Telephone follow up appointment with care management team member scheduled for:  08/24/21  Domenic Moras, PharmD, BCACP, CPP Clinical Pharmacist Practitioner [facility] [phone]

## 2021-07-29 DIAGNOSIS — E1140 Type 2 diabetes mellitus with diabetic neuropathy, unspecified: Secondary | ICD-10-CM | POA: Diagnosis not present

## 2021-08-06 DIAGNOSIS — Z720 Tobacco use: Secondary | ICD-10-CM

## 2021-08-06 DIAGNOSIS — I10 Essential (primary) hypertension: Secondary | ICD-10-CM

## 2021-08-06 DIAGNOSIS — E785 Hyperlipidemia, unspecified: Secondary | ICD-10-CM

## 2021-08-06 DIAGNOSIS — E1140 Type 2 diabetes mellitus with diabetic neuropathy, unspecified: Secondary | ICD-10-CM

## 2021-08-06 DIAGNOSIS — Z794 Long term (current) use of insulin: Secondary | ICD-10-CM

## 2021-08-06 DIAGNOSIS — I2510 Atherosclerotic heart disease of native coronary artery without angina pectoris: Secondary | ICD-10-CM

## 2021-08-08 ENCOUNTER — Other Ambulatory Visit: Payer: Self-pay | Admitting: Internal Medicine

## 2021-08-10 ENCOUNTER — Other Ambulatory Visit: Payer: Self-pay | Admitting: Internal Medicine

## 2021-08-10 ENCOUNTER — Ambulatory Visit: Payer: Medicare HMO | Admitting: Internal Medicine

## 2021-08-12 ENCOUNTER — Other Ambulatory Visit (HOSPITAL_COMMUNITY): Payer: Self-pay | Admitting: Neurology

## 2021-08-12 ENCOUNTER — Other Ambulatory Visit: Payer: Self-pay | Admitting: Neurology

## 2021-08-12 DIAGNOSIS — G4459 Other complicated headache syndrome: Secondary | ICD-10-CM | POA: Diagnosis not present

## 2021-08-12 DIAGNOSIS — Z79891 Long term (current) use of opiate analgesic: Secondary | ICD-10-CM | POA: Diagnosis not present

## 2021-08-12 DIAGNOSIS — M5451 Vertebrogenic low back pain: Secondary | ICD-10-CM | POA: Diagnosis not present

## 2021-08-12 DIAGNOSIS — M25561 Pain in right knee: Secondary | ICD-10-CM | POA: Diagnosis not present

## 2021-08-12 DIAGNOSIS — M542 Cervicalgia: Secondary | ICD-10-CM | POA: Diagnosis not present

## 2021-08-12 DIAGNOSIS — M25562 Pain in left knee: Secondary | ICD-10-CM | POA: Diagnosis not present

## 2021-08-12 DIAGNOSIS — R519 Headache, unspecified: Secondary | ICD-10-CM | POA: Diagnosis not present

## 2021-08-23 ENCOUNTER — Other Ambulatory Visit: Payer: Self-pay | Admitting: Internal Medicine

## 2021-08-23 DIAGNOSIS — I10 Essential (primary) hypertension: Secondary | ICD-10-CM

## 2021-08-24 ENCOUNTER — Ambulatory Visit (INDEPENDENT_AMBULATORY_CARE_PROVIDER_SITE_OTHER): Payer: Medicare HMO | Admitting: Pharmacist

## 2021-08-24 DIAGNOSIS — E785 Hyperlipidemia, unspecified: Secondary | ICD-10-CM

## 2021-08-24 DIAGNOSIS — I2510 Atherosclerotic heart disease of native coronary artery without angina pectoris: Secondary | ICD-10-CM

## 2021-08-24 DIAGNOSIS — Z720 Tobacco use: Secondary | ICD-10-CM

## 2021-08-24 DIAGNOSIS — E1140 Type 2 diabetes mellitus with diabetic neuropathy, unspecified: Secondary | ICD-10-CM

## 2021-08-24 DIAGNOSIS — I10 Essential (primary) hypertension: Secondary | ICD-10-CM

## 2021-08-24 NOTE — Patient Instructions (Signed)
[name],  It was great to talk to you today!  Please call me with any questions or concerns.  Visit Information  Following are the goals we discussed today:   Goals Addressed             This Visit's Progress    Medication Management       Patient Goals/Self-Care Activities Over the next 90 days, patient will:  Take medications as prescribed Check blood sugar continuously with continuous glucose monitor, document, and provide at future appointments Check blood pressure at least once daily, document, and provide at future appointments Target a minimum of 150 minutes of moderate intensity exercise weekly Engage in dietary modifications by fewer sweetened foods & beverages Work on stopping or reducing smoking           Follow-up plan: Telephone follow up appointment with care management team member scheduled for:  09/21/21 Future Appointments  Date Time Provider [place]  08/26/2021  8:00 AM Lindell Spar, MD RPC-RPC [facility]  08/26/2021 10:00 AM AP-MR 1 AP-MRI [place] [facility]  09/21/2021 10:30 AM RPC-CCM PHARMACIST RPC-RPC [facility]  03/03/2022  8:00 AM RPC-RPC NURSE RPC-RPC R[facility]    The patient verbalized understanding of instructions, educational materials, and care plan provided today and agreed to receive a mailed copy of patient instructions, educational materials, and care plan.   Please call the care guide team at [phone] if you need to cancel or reschedule your appointment.   Kennon Holter, PharmD, [place], CPP Clinical Pharmacist Practitioner [facility] [phone]

## 2021-08-24 NOTE — Chronic Care Management (AMB) (Signed)
Chronic Care Management Pharmacy Note  08/24/2021 Name:  Stephanie Hammond MRN:  [id number] DOB:  Oct 30, 1950  Summary: Type 2 Diabetes Patient reports she has decreased Basaglar to 20-25 units daily and continues to report using Ozempic 0.25 mg weekly Most recent glucose readings: see Dexcom Clarity reports below for last 30 days. 89% of the time in target range. 3% below target range and 8% above target range. Several low blood glucose events recorded on Dexcom but patient reports that when she checked finger stick blood glucose it was in the 90-100s so she did not need to treat for hypoglycemia. Unclear explanation for this occurrence. Continue monitoring blood glucose continuously with Dexcom G6 unless symptoms do not match sensor glucose then check ginger stick blood glucose.  Continue metformin 1,000 mg every morning with breakfast Continue Basaglar 20-25 units at bedtime Instructed patient to increase Ozempic to 0.5 mg subcutaneously once weekly Continue as needed use of Novolog 5 units if blood glucose >300     Tobacco Abuse (Current Smoker) Patient endorses that she has recently cut down from 20 cigarettes per day to 6-7 cigarettes per day Advised patient to quit smoking and offered support Patient wants to try to quit cold [place] over next few weeks. She was encouraged to keep momentum going.  May consider nicotine replacement therapy, Chantix or Wellbutrin if patient desires to use pharmacological therapy to assist her efforts.   Subjective: AKAIA GWYN is an 71 y.o. year old female who is a primary patient of Anabel Halon, MD.  The CCM team was consulted for assistance with disease management and care coordination needs.    Engaged with patient by telephone for follow up visit in response to provider referral for pharmacy case management and/or care coordination services.   Consent to Services:  The patient was given information about Chronic Care Management  services, agreed to services, and gave verbal consent prior to initiation of services.  Please see initial visit note for detailed documentation.   Patient Care Team: Anabel Halon, MD as PCP - General (Internal Medicine) [place] Bali, MD (Inactive) as Consulting Physician (Gastroenterology) Gavin Pound, [facility] (Pharmacist)  Objective:  Lab Results  Component Value Date   CREATININE 0.84 05/04/2021   CREATININE 0.87 03/19/2020   CREATININE 0.87 01/21/2020    Lab Results  Component Value Date   HGBA1C 5.4 05/04/2021   Last diabetic Eye exam:  Lab Results  Component Value Date/Time   HMDIABEYEEXA No Retinopathy 11/19/2019 12:00 AM    Last diabetic Foot exam: No results found for: HMDIABFOOTEX      Component Value Date/Time   CHOL 101 05/04/2021 1023   TRIG 69 05/04/2021 1023   HDL 44 05/04/2021 1023   CHOLHDL 2.3 05/04/2021 1023   CHOLHDL 2.5 03/19/2020 1127   VLDL 27 09/11/2016 1255   LDLCALC 42 05/04/2021 1023   LDLCALC CANCELED 10/04/2020 1301   LDLDIRECT 68 12/01/2013 1454    Hepatic Function Latest Ref Rng & Units 05/04/2021 03/19/2020 05/13/2019  Total Protein 6.0 - 8.5 g/dL 7.1 6.7 7.2  Albumin 3.8 - 4.8 g/dL 4.3 - -  AST 0 - 40 IU/L 17 12 15   ALT 0 - 32 IU/L 18 10 14   Alk Phosphatase 44 - 121 IU/L 162(H) - -  Total Bilirubin 0.0 - 1.2 mg/dL <9.5 0.3 0.3    Lab Results  Component Value Date/Time   TSH 0.588 05/04/2021 10:23 AM   TSH 0.75 09/10/2017 03:01 PM  CBC Latest Ref Rng & Units 05/04/2021 10/04/2020 05/03/2020  WBC 3.4 - 10.8 x10E3/uL 6.2 CANCELED 6.2  Hemoglobin 11.1 - 15.9 g/dL 40.9 - 10.2(L)  Hematocrit 34.0 - 46.6 % 35.7 - 31.4(L)  Platelets 150 - 450 x10E3/uL 339 - 373    No results found for: VD25OH  Clinical ASCVD: No  The ASCVD Risk score (Arnett DK, et al., 2019) failed to calculate for the following reasons:   The valid total cholesterol range is 130 to 320 mg/dL    Social History   Tobacco Use  Smoking Status Every  Day   Packs/day: 1.00   Years: 35.00   Pack years: 35.00   Types: Cigarettes  Smokeless Tobacco Never   BP Readings from Last 3 Encounters:  03/22/21 125/74  10/25/20 121/70  10/04/20 138/64   Pulse Readings from Last 3 Encounters:  03/22/21 91  10/25/20 (!) 111  10/04/20 100   Wt Readings from Last 3 Encounters:  03/22/21 208 lb 1.3 oz (94.4 kg)  10/25/20 208 lb 1.9 oz (94.4 kg)  10/04/20 209 lb (94.8 kg)    Assessment: Review of patient past medical history, allergies, medications, health status, including review of consultants reports, laboratory and other test data, was performed as part of comprehensive evaluation and provision of chronic care management services.   SDOH:  (Social Determinants of Health) assessments and interventions performed:    CCM Care Plan  Allergies  Allergen Reactions   Ace Inhibitors Cough   Bentyl [Dicyclomine Hcl] Other (See Comments)    Blurry vision   Penicillins Itching and Swelling    Whelps Did it involve swelling of the face/tongue/throat, SOB, or low BP? No Did it involve sudden or severe rash/hives, skin peeling, or any reaction on the inside of your mouth or nose? No Did you need to seek medical attention at a hospital or doctor's office? No When did it last happen?      20 years If all above answers are NO, may proceed with cephalosporin use.     Medications Reviewed Today     Reviewed by Gavin Pound, [facility] (Pharmacist) on 08/24/21 at 1112  Med List Status: <None>   Medication Order Taking? Sig Documenting Provider Last Dose Status Informant  allopurinol (ZYLOPRIM) 300 MG tablet [id number] Yes TAKE ONE TABLET BY MOUTH EVERY DAY Anabel Halon, MD Taking Active   amLODipine (NORVASC) 10 MG tablet [id number] Yes TAKE ONE TABLET BY MOUTH ONCE DAILY Anabel Halon, MD Taking Active   atorvastatin (LIPITOR) 20 MG tablet [id number] Yes TAKE ONE TABLET BY MOUTH ONCE DAILY Anabel Halon, MD Taking Active   Blood  Glucose Monitoring Suppl (BLOOD GLUCOSE SYSTEM PAK) KIT [id number] Yes Please dispense based on patient and insurance preference. Use as directed to monitor FSBS 3x daily. Dx: E11.9 Donita Brooks, MD Taking Active   BUTRANS 15 MCG/HR [id number] Yes Place 1 patch onto the skin once a week. [provider]  Active   Continuous Blood Gluc Sensor (DEXCOM G6 SENSOR) MISC [id number] Yes Apply new sensor every 10 days [provider] Taking Active Self  Continuous Blood Gluc Transmit (DEXCOM G6 TRANSMITTER) MISC [id number] Yes Use 1 transmitter every 90 days [provider] Taking Active Self  DULoxetine (CYMBALTA) 60 MG capsule [id number] Yes Take 60 mg by mouth daily. [provider] Taking Active   Glucose Blood (BLOOD GLUCOSE TEST STRIPS) STRP [id number]  Please dispense based on patient and insurance preference. Use as directed to monitor FSBS  3x daily. Dx: E11.9  For testing 4 times daily Anabel Halon, MD  Active   Insulin Glargine [facility]) 100 UNIT/ML [id number] Yes Inject 25 Units into the skin at bedtime.  Patient taking differently: Inject 20-25 Units into the skin at bedtime.   , Velna Hatchet, MD Taking Active   Insulin Pen Needle (B-D UF III MINI PEN NEEDLES) 31G X 5 MM MISC [id number] Yes FOR USE with INSULIN PENS Donita Brooks, MD Taking Active   Lancets MISC [id number] Yes Please dispense based on patient and insurance preference. Use as directed to monitor FSBS 3x daily. Dx: E11.9  For testing 4 times daily Anabel Halon, MD Taking Active   losartan (COZAAR) 100 MG tablet [id number] Yes TAKE ONE TABLET BY MOUTH ONCE DAILY Anabel Halon, MD Taking Active   metFORMIN (GLUCOPHAGE-XR) 500 MG 24 hr tablet [id number] Yes TAKE 2 TABLETS(1000 MG) BY MOUTH DAILY WITH BREAKFAST Anabel Halon, MD Taking Active   NOVOLOG FLEXPEN 100 UNIT/ML FlexPen [id number] Yes Sliding Scale: Blood Sugar of 150-200 = TWO units, 201-250 = 4units, 251-300 = 6units,  301-350 = 8units, 351-400 = 10units Anabel Halon, MD Taking Active   NURTEC 75 MG TBDP [id number] Yes Take 1 tablet by mouth daily as needed. [provider] Taking Active   omeprazole (PRILOSEC) 20 MG capsule [id number] Yes TAKE 1 CAPSULE BY MOUTH EVERY DAY IN THE [place], Velna Hatchet, MD Taking Active   oxyCODONE-acetaminophen (PERCOCET) 10-325 MG tablet [id number] Yes Take 1 tablet by mouth 2 (two) times daily. [provider] Taking Active   Semaglutide,0.25 or 0.5MG /DOS, 2 MG/1.5ML SOPN [id number] Yes Inject 0.5 mg into the skin every 7 (seven) days. [provider] Taking Active            Med Note Lourena Simmonds Jul 27, 2021 11:45 AM) Patient reports taking 0.25 mg weekly on Wednesday or Thursday  topiramate (TOPAMAX) 25 MG tablet [id number] Yes Take 50 mg by mouth daily. [provider] Taking Active             Patient Active Problem List   Diagnosis Date Noted   Encounter for general adult medical examination with abnormal findings 03/22/2021   Hardening of the aorta (main artery of the heart) (HCC) 10/25/2020   Chronic pain syndrome 10/25/2020   Insomnia 08/18/2020   Colovaginal fistula 05/13/2019   History of diverticulitis 03/19/2019   Failed total knee, left, initial encounter (HCC) 01/09/2019   Anemia 12/31/2015   Arthritis of knee, degenerative 03/12/2015   Arthritis of left knee    Gout 09/07/2014   Back pain with left-sided radiculopathy 09/07/2014   Type 2 diabetes mellitus with diabetic neuropathy (HCC) 02/14/2014   Hyperlipidemia 03/12/2013   IBS (irritable bowel syndrome) 01/09/2013   Diabetic neuropathy (HCC) 11/14/2012   Major depression, single episode 09/13/2012   Essential hypertension, benign 06/09/2012   Sinus tachycardia 03/28/2012   DDD (degenerative disc disease), lumbar 03/28/2012   Chronic knee pain 03/28/2012   Obesity (BMI 30-39.9) 03/28/2012   Tobacco abuse 03/28/2012   CAD (coronary  artery disease) 03/28/2012    Immunization History  Administered Date(s) Administered   Moderna Sars-Covid-2 Vaccination 10/01/2019, 10/29/2019, 07/22/2020   PPD Test 09/03/2017   Zoster Recombinat (Shingrix) 11/07/2017, 11/07/2017    Conditions to be addressed/monitored: CAD, HTN, HLD, DMII, and Tobacco Use  Care Plan : Medication Management  Updates made by Gavin Pound, RPH since 08/24/2021 12:00 AM  Problem: T2DM, HTN, CAD/HLD, Tobacco Use   Priority: High  Onset Date: 04/07/2021     Long-Range Goal: Disease Progression Prevention   Start Date: 04/07/2021  Expected End Date: 07/06/2021  Recent Progress: On track  Priority: High  Note:   Current Barriers:  Unable to independently monitor therapeutic efficacy  Pharmacist Clinical Goal(s):  Over the next 90 days, patient will Achieve adherence to monitoring guidelines and medication adherence to achieve therapeutic efficacy through collaboration with PharmD and provider.   Interventions: 1:1 collaboration with Anabel Halon, MD regarding development and update of comprehensive plan of care as evidenced by provider attestation and co-signature Inter-disciplinary care team collaboration (see longitudinal plan of care) Comprehensive medication review performed; medication list updated in electronic medical record  Type 2 Diabetes - goal on track (progressing): YES: Controlled; Most recent A1c at goal of <7% per ADA guidelines Current medications: metformin 1,000 mg by mouth with breakfast, semaglutide (Ozempic) 0.5 mg subcutaneously weekly, and insulin glargine (Basaglar) 25 units subcutaneously once daily and insulin aspart (Novolog) 5 units as needed for blood glucose >300 Intolerances: none Taking medications as directed: no, patient reports she has decreased Basaglar to 20-25 units daily and continues to report using Ozempic 0.25 mg weekly Side effects thought to be attributed to current medication regimen:  no Hypoglycemia prevention: not indicated at this time Current meal patterns: breakfast: patient reports she has cut our cereal and fruit for breakfast and is now eating a boiled egg and yogurt.  Current exercise: not discussed today On a statin: [x]  Yes  []  No    Last microalbumin: 0.9 (10/04/20); on an ACEi/ARB: [x]  Yes  []  No    Most recent glucose readings: see Dexcom Clarity reports below for last 30 days. 89% of the time in target range. 3% below target range and 8% above target range. Several low blood glucose events recorded on Dexcom but patient reports that when she checked finger stick blood glucose it was in the 90-100s so she did not need to treat for hypoglycemia. Unclear explanation for this occurrence. Continue monitoring blood glucose continuously with Dexcom G6 unless symptoms do not match sensor glucose then check ginger stick blood glucose.  Continue metformin 1,000 mg every morning with breakfast Continue Basaglar 20-25 units at bedtime Instructed patient to increase Ozempic to 0.5 mg subcutaneously once weekly Continue as needed use of Novolog 5 units if blood glucose >300     Hypertension - condition stable not addressed at this visit: Blood pressure under good control. Blood pressure is at goal of <130/80 mmHg per 2017 AHA/ACC guidelines. Current medications: losartan 100 mg by mouth once daily and amlodipine 10 mg by mouth once daily Intolerances:  Ace inhibitor (cough) Taking medications as directed: yes Side effects thought to be attributed to current medication regimen: no Home blood pressure readings: not discussed today Continue losartan 100 mg by mouth once daily and amlodipine 10 mg by mouth once daily Encourage dietary sodium restriction/DASH diet Recommend regular aerobic exercise Recommend home blood pressure monitoring to discuss at next visit  Coronary artery disease/Hyperlipidemia  - condition stable not addressed at this visit: Controlled; LDL at  goal of <70 due to very high risk given diabetes + at least 1 additional major risk factor (hypertension and cigarette smoking) per 2020 AACE/ACE guidelines and TG at goal of <150 per 2020 AACE/ACE guidelines Current medications: atorvastatin 20 mg by mouth once daily Intolerances: none Taking medications as directed: yes Side effects thought to be attributed to  current medication regimen: no Continue atorvastatin 20 mg by mouth once daily Recommend regular aerobic exercise Smoking cessation is advised. Offered support.   Tobacco Abuse (Current Smoker) - Goal on Track (progressing): YES.: Patient endorses that she has recently cut down from 20 cigarettes per day to 6-7 cigarettes per day Previous quit attempts: successful quitting cold [place] but has frequent relapses; patient does not think she has ever tried pharmacologic therapy to assist her; has spoken with [facility] in the past and she states "they drove me crazy calling me all the time" Current medication management: none Known triggers: stress Barriers: under a lot of stress now Advised patient to quit smoking and offered support Patient wants to try to quit cold [place] over next few weeks. She was encouraged to keep momentum going.  May consider nicotine replacement therapy, Chantix or Wellbutrin if patient desires to use pharmacological therapy to assist her efforts.   Patient Goals/Self-Care Activities Over the next 90 days, patient will:  Take medications as prescribed Check blood sugar continuously with continuous glucose monitor, document, and provide at future appointments Check blood pressure at least once daily, document, and provide at future appointments Target a minimum of 150 minutes of moderate intensity exercise weekly Engage in dietary modifications by fewer sweetened foods & beverages Work on stopping or reducing smoking  Follow Up Plan: Telephone follow up appointment with care management team member scheduled  for: 09/21/21      Medication Assistance: None required.  Patient affirms current coverage meets needs.  Patient's preferred pharmacy is:  [facility] [place], [place] - [address] [place] [address] Phone: [phone] Fax: [phone]  [id number] - [place], [place] - [address] [place] [address] Phone: [phone] Fax: [phone]  Follow Up:  Patient agrees to Care Plan and Follow-up.  Plan: Telephone follow up appointment with care management team member scheduled for:  09/21/21  Domenic Moras, PharmD, BCACP, CPP Clinical Pharmacist Practitioner [facility] [phone]

## 2021-08-26 ENCOUNTER — Ambulatory Visit (HOSPITAL_COMMUNITY)
Admission: RE | Admit: 2021-08-26 | Discharge: 2021-08-26 | Disposition: A | Discharge status: Home / Self Care | Payer: Medicare HMO | Source: Ambulatory Visit | Attending: Neurology | Admitting: Neurology

## 2021-08-26 ENCOUNTER — Encounter: Payer: Self-pay | Admitting: Internal Medicine

## 2021-08-26 ENCOUNTER — Ambulatory Visit (INDEPENDENT_AMBULATORY_CARE_PROVIDER_SITE_OTHER): Payer: Medicare HMO | Admitting: Internal Medicine

## 2021-08-26 ENCOUNTER — Other Ambulatory Visit: Payer: Self-pay

## 2021-08-26 VITALS — BP 115/73 | HR 104 | Ht 63.0 in | Wt 208.0 lb

## 2021-08-26 DIAGNOSIS — I10 Essential (primary) hypertension: Secondary | ICD-10-CM | POA: Diagnosis not present

## 2021-08-26 DIAGNOSIS — I6782 Cerebral ischemia: Secondary | ICD-10-CM | POA: Diagnosis not present

## 2021-08-26 DIAGNOSIS — E1140 Type 2 diabetes mellitus with diabetic neuropathy, unspecified: Secondary | ICD-10-CM

## 2021-08-26 DIAGNOSIS — E1141 Type 2 diabetes mellitus with diabetic mononeuropathy: Secondary | ICD-10-CM | POA: Diagnosis not present

## 2021-08-26 DIAGNOSIS — R519 Headache, unspecified: Secondary | ICD-10-CM | POA: Diagnosis not present

## 2021-08-26 DIAGNOSIS — I2510 Atherosclerotic heart disease of native coronary artery without angina pectoris: Secondary | ICD-10-CM

## 2021-08-26 DIAGNOSIS — Z794 Long term (current) use of insulin: Secondary | ICD-10-CM | POA: Diagnosis not present

## 2021-08-26 LAB — POCT GLYCOSYLATED HEMOGLOBIN (HGB A1C): HbA1c, POC (controlled diabetic range): 5.7 % (ref 0.0–7.0)

## 2021-08-26 MED ORDER — SEMAGLUTIDE (1 MG/DOSE) 4 MG/3ML ~~LOC~~ SOPN
1.0000 mg | PEN_INJECTOR | SUBCUTANEOUS | 3 refills | Status: DC
Start: 2021-08-26 — End: 2021-12-23

## 2021-08-26 NOTE — Progress Notes (Signed)
Established Patient Office Visit  Subjective:  Patient ID: Stephanie Hammond, female    DOB: 1951-07-24  Age: 71 y.o. MRN: [id number]  CC:  Chief Complaint  Patient presents with   Follow-up    4 month follow up having issues with dexcom     HPI Stephanie Hammond is a 71 y.o. female with past medical history of HTN, HLD, type 2 DM, diverticulitis, s/p partial colectomy, chronic pain syndrome, gout, obesity and tobacco abuse who presents for f/u of her chronic medical conditions.  BP is well-controlled. Takes medications regularly. Patient denies headache, dizziness, chest pain, dyspnea or palpitations.  Type II DM with HLD: She has tolerated Ozempic well. She takes Basaglar 20-25 units according to blood glucose at nighttime.  She agrees to take Basaglar 25 units at nighttime for now.  She also takes NovoLog 5 units as needed for blood glucose more than 250.  She also takes metformin and wants to discontinue it later.  She has Dexcom currently, but states that she is having gross discrepancy between Dexcom readings and fingerstick checks, similar problem was noticed with freestyle libre as well.  Her glucometer averages are as below: 7-day: 170 14-day: 164 30-day: 167  She takes Lipitor for HLD.  Past Medical History:  Diagnosis Date   Anemia    Blood transfusion 2009   after GI bleed   CAD (coronary artery disease)    Diabetes mellitus    2   Diverticulitis    Diverticulosis    Fear of local anesthetic    01-02-2019, patient states with last surgery in 2016 , she refused to have epidural before she was sedated d/t to extreme aversion to needles near her back     GERD (gastroesophageal reflux disease)    Gout    Headache    occ if sugar is  too hi or low    Heart murmur    "i was told this years and years and years ago "   History of GI diverticular bleed 08/2004   Hypertension    IBS (irritable bowel syndrome)    Osteoarthritis    Tachycardia    "it stays like that  for years between 119 and 121 and it doesnt bother me  none "     Past Surgical History:  Procedure Laterality Date   CHOLECYSTECTOMY  2003   COLON SURGERY     COLONOSCOPY  2000   Dr. Russella Dar: marked diverticulosis, difficult procedure due to adhesions, polyps benign   COLONOSCOPY  2006   Dr. Marina Goodell: severe pandiverticulosis   COLONOSCOPY N/A 02/03/2013   SLF: 4 COLORECTAL POLYPS REMOVED/Moderate diverticulosis throughout the entire examined colon/Small internal hemorrhoids   COLONOSCOPY WITH PROPOFOL N/A 04/29/2019   Procedure: COLONOSCOPY WITH PROPOFOL;  Surgeon: West Bali, MD;  Location: [facility];  Service: Endoscopy;  Laterality: N/A;  8:30am   JOINT REPLACEMENT Right 2009   Knee Replacement Right    POLYPECTOMY  04/29/2019   Procedure: POLYPECTOMY;  Surgeon: West Bali, MD;  Location: [facility];  Service: Endoscopy;;  colon   PTCA     over 10 years ago , unsure of exact year , does recall that no stents were placed    REPLACEMENT TOTAL KNEE     right    TOTAL KNEE ARTHROPLASTY Left 03/12/2015   Procedure: LEFT TOTAL KNEE REPLACEMENT;  Surgeon: Vickki Hearing, MD;  Location: [facility];  Service: Orthopedics;  Laterality: Left;   TOTAL KNEE  REVISION Left 01/09/2019   Procedure: TOTAL KNEE REVISION;  Surgeon: Samson Frederic, MD;  Location: [facility];  Service: Orthopedics;  Laterality: Left;   VAGINAL HYSTERECTOMY      Family History  Problem Relation Age of Onset   Colon polyps Sister    Cancer Mother        lung    Diabetes Mother    Cancer Father        stomach    Stomach cancer Father        questionable   Colon cancer Neg Hx     Social History   Socioeconomic History   Marital status: Married    Spouse name: Not on file   Number of children: 2   Years of education: Not on file   Highest education level: Not on file  Occupational History   Not on file  Tobacco Use   Smoking status: Every Day    Packs/day: 1.00    Years: 35.00    Pack years:  35.00    Types: Cigarettes   Smokeless tobacco: Never  Vaping Use   Vaping Use: Never used  Substance and Sexual Activity   Alcohol use: No   Drug use: No   Sexual activity: Not Currently    Birth control/protection: Surgical    Comment: hyst  Other Topics Concern   Not on file  Social History Narrative   Husband passed away lives alone    Social Determinants of Health   Financial Resource Strain: Low Risk    Difficulty of Paying Living Expenses: Not hard at all  Food Insecurity: No Food Insecurity   Worried About Programme researcher, broadcasting/film/video in the Last Year: Never true   Ran Out of Food in the Last Year: Never true  Transportation Needs: No Transportation Needs   Lack of Transportation (Medical): No   Lack of Transportation (Non-Medical): No  Physical Activity: Inactive   Days of Exercise per Week: 0 days   Minutes of Exercise per Session: 0 min  Stress: No Stress Concern Present   Feeling of Stress : Not at all  Social Connections: Moderately Isolated   Frequency of Communication with Friends and Family: More than three times a week   Frequency of Social Gatherings with Friends and Family: More than three times a week   Attends Religious Services: More than 4 times per year   Active Member of [employer] or Organizations: No   Attends Banker Meetings: Never   Marital Status: Widowed  Catering manager Violence: Not At Risk   Fear of Current or Ex-Partner: No   Emotionally Abused: No   Physically Abused: No   Sexually Abused: No    Outpatient Medications Prior to Visit  Medication Sig Dispense Refill   allopurinol (ZYLOPRIM) 300 MG tablet TAKE ONE TABLET BY MOUTH EVERY DAY 90 tablet 0   amLODipine (NORVASC) 10 MG tablet TAKE ONE TABLET BY MOUTH ONCE DAILY 90 tablet 1   atorvastatin (LIPITOR) 20 MG tablet TAKE ONE TABLET BY MOUTH ONCE DAILY 90 tablet 1   Blood Glucose Monitoring Suppl (BLOOD GLUCOSE SYSTEM PAK) KIT Please dispense based on patient and insurance  preference. Use as directed to monitor FSBS 3x daily. Dx: E11.9 1 kit 1   BUTRANS 15 MCG/HR Place 1 patch onto the skin once a week.     Continuous Blood Gluc Sensor (DEXCOM G6 SENSOR) MISC Apply new sensor every 10 days     Continuous Blood Gluc Transmit (DEXCOM G6  TRANSMITTER) MISC Use 1 transmitter every 90 days     DULoxetine (CYMBALTA) 60 MG capsule Take 60 mg by mouth daily.     Glucose Blood (BLOOD GLUCOSE TEST STRIPS) STRP Please dispense based on patient and insurance preference. Use as directed to monitor FSBS 3x daily. Dx: E11.9  For testing 4 times daily 100 strip 11   Insulin Glargine (BASAGLAR KWIKPEN) 100 UNIT/ML Inject 25 Units into the skin at bedtime. (Patient taking differently: Inject 20-25 Units into the skin at bedtime.) 15 mL 3   Insulin Pen Needle (B-D UF III MINI PEN NEEDLES) 31G X 5 MM MISC FOR USE with INSULIN PENS 100 each 10   Lancets MISC Please dispense based on patient and insurance preference. Use as directed to monitor FSBS 3x daily. Dx: E11.9  For testing 4 times daily 100 each 11   losartan (COZAAR) 100 MG tablet TAKE ONE TABLET BY MOUTH ONCE DAILY 90 tablet 1   metFORMIN (GLUCOPHAGE-XR) 500 MG 24 hr tablet TAKE 2 TABLETS(1000 MG) BY MOUTH DAILY WITH BREAKFAST 180 tablet 2   NOVOLOG FLEXPEN 100 UNIT/ML FlexPen Sliding Scale: Blood Sugar of 150-200 = TWO units, 201-250 = 4units, 251-300 = 6units, 301-350 = 8units, 351-400 = 10units 15 mL 10   NURTEC 75 MG TBDP Take 1 tablet by mouth daily as needed.     omeprazole (PRILOSEC) 20 MG capsule TAKE 1 CAPSULE BY MOUTH EVERY DAY IN THE MORNING 90 capsule 3   oxyCODONE-acetaminophen (PERCOCET) 10-325 MG tablet Take 1 tablet by mouth 2 (two) times daily.     topiramate (TOPAMAX) 25 MG tablet Take 50 mg by mouth daily.     Semaglutide,0.25 or 0.5MG /DOS, 2 MG/1.5ML SOPN Inject 0.5 mg into the skin every 7 (seven) days.     No facility-administered medications prior to visit.    Allergies  Allergen Reactions   Ace  Inhibitors Cough   Bentyl [Dicyclomine Hcl] Other (See Comments)    Blurry vision   Penicillins Itching and Swelling    Whelps Did it involve swelling of the face/tongue/throat, SOB, or low BP? No Did it involve sudden or severe rash/hives, skin peeling, or any reaction on the inside of your mouth or nose? No Did you need to seek medical attention at a hospital or doctor's office? No When did it last happen?      20 years If all above answers are NO, may proceed with cephalosporin use.     ROS Review of Systems  Constitutional:  Negative for chills and fever.  HENT:  Negative for congestion, sinus pressure, sinus pain and sore throat.   Eyes:  Negative for pain and discharge.  Respiratory:  Negative for cough and shortness of breath.   Cardiovascular:  Negative for chest pain and palpitations.  Gastrointestinal:  Negative for abdominal pain, constipation, diarrhea, nausea and vomiting.  Endocrine: Negative for polydipsia and polyuria.  Genitourinary:  Negative for dysuria and hematuria.  Musculoskeletal:  Positive for arthralgias and back pain. Negative for neck pain and neck stiffness.  Skin:  Negative for rash.  Neurological:  Negative for dizziness and weakness.  Psychiatric/Behavioral:  Negative for agitation and behavioral problems.      Objective:    Physical Exam Vitals reviewed.  Constitutional:      General: She is not in acute distress.    Appearance: She is obese. She is not diaphoretic.  HENT:     Head: Normocephalic and atraumatic.     Nose: Nose normal.  Mouth/Throat:     Mouth: Mucous membranes are moist.  Eyes:     General: No scleral icterus.    Extraocular Movements: Extraocular movements intact.  Neck:     Vascular: No carotid bruit.  Cardiovascular:     Rate and Rhythm: Normal rate and regular rhythm.     Pulses: Normal pulses.     Heart sounds: No murmur heard. Pulmonary:     Breath sounds: Normal breath sounds. No wheezing or rales.   Musculoskeletal:     Cervical back: Neck supple. No tenderness.     Right lower leg: No edema.     Left lower leg: No edema.  Skin:    General: Skin is warm.     Findings: No rash.  Neurological:     General: No focal deficit present.     Mental Status: She is alert and oriented to person, place, and time.     Sensory: No sensory deficit.     Motor: No weakness.  Psychiatric:        Mood and Affect: Mood normal.        Behavior: Behavior normal.    BP 115/73    Pulse (!) 104    Ht 5\' 3"  (1.6 m)    Wt 208 lb (94.3 kg)    SpO2 95%    BMI 36.85 kg/m  Wt Readings from Last 3 Encounters:  08/26/21 208 lb (94.3 kg)  03/22/21 208 lb 1.3 oz (94.4 kg)  10/25/20 208 lb 1.9 oz (94.4 kg)    Lab Results  Component Value Date   TSH 0.588 05/04/2021   Lab Results  Component Value Date   WBC 6.2 05/04/2021   HGB 12.5 05/04/2021   HCT 35.7 05/04/2021   MCV 92 05/04/2021   PLT 339 05/04/2021   Lab Results  Component Value Date   NA 138 05/04/2021   K 5.4 (H) 05/04/2021   CO2 22 05/04/2021   GLUCOSE 121 (H) 05/04/2021   BUN 20 05/04/2021   CREATININE 0.84 05/04/2021   BILITOT <0.2 05/04/2021   ALKPHOS 162 (H) 05/04/2021   AST 17 05/04/2021   ALT 18 05/04/2021   PROT 7.1 05/04/2021   ALBUMIN 4.3 05/04/2021   CALCIUM 9.4 05/04/2021   ANIONGAP 12 04/25/2019   EGFR 76 05/04/2021   Lab Results  Component Value Date   CHOL 101 05/04/2021   Lab Results  Component Value Date   HDL 44 05/04/2021   Lab Results  Component Value Date   LDLCALC 42 05/04/2021   Lab Results  Component Value Date   TRIG 69 05/04/2021   Lab Results  Component Value Date   CHOLHDL 2.3 05/04/2021   Lab Results  Component Value Date   HGBA1C 5.7 08/26/2021      Assessment & Plan:   Problem List Items Addressed This Visit       Cardiovascular and Mediastinum   CAD (coronary artery disease)    On statin BP WNL      Essential hypertension, benign    BP Readings from Last 1  Encounters:  08/26/21 115/73  Well-controlled with Amlodipine and Losartan Counseled for compliance with the medications Advised DASH diet and moderate exercise/walking, at least 150 mins/week        Endocrine   Diabetic neuropathy (HCC)    On Cymbalta now Followed by [facility]      Relevant Medications   Semaglutide, 1 MG/DOSE, 4 MG/3ML SOPN   Type 2 diabetes mellitus with diabetic  neuropathy (HCC) - Primary    Lab Results  Component Value Date   HGBA1C 5.7 08/26/2021   On Basaglar 25 U qHS and Novolog 5 U PRN for blood glucose > 250, takes Basaglar 20-25 U based on nighttime reading, advised to stay with 25 U dose for now On Ozempic 0.5 mg, increase to 1 mg dose On Metformin 500 mg QD Has Dexcom, but did not work properly and was having gross discrepancy between it and fingerstick -advised to continue fingerstick blood glucose checks for now Used to follow up with Endocrinologist in [place] On ARB and statin       Relevant Medications   Semaglutide, 1 MG/DOSE, 4 MG/3ML SOPN   Other Relevant Orders   POCT glycosylated hemoglobin (Hb A1C) (Completed)    Meds ordered this encounter  Medications   Semaglutide, 1 MG/DOSE, 4 MG/3ML SOPN    Sig: Inject 1 mg as directed once a week.    Dispense:  3 mL    Refill:  3    Follow-up: Return in about 4 months (around 12/24/2021) for DM and HTN.    Anabel Halon, MD

## 2021-08-26 NOTE — Assessment & Plan Note (Addendum)
On Cymbalta now Followed by [facility]

## 2021-08-26 NOTE — Patient Instructions (Signed)
Please start taking Ozempic 1 mg dose.  Continue taking Basaglar 25 U every night.  Please continue to take other medications as prescribed.  Please use wrist brace for carpal tunnel at nighttime.

## 2021-08-26 NOTE — Assessment & Plan Note (Signed)
BP Readings from Last 1 Encounters:  08/26/21 115/73   Well-controlled with Amlodipine and Losartan Counseled for compliance with the medications Advised DASH diet and moderate exercise/walking, at least 150 mins/week

## 2021-08-26 NOTE — Assessment & Plan Note (Signed)
Lab Results  Component Value Date   HGBA1C 5.7 08/26/2021    On Basaglar 25 U qHS and Novolog 5 U PRN for blood glucose > 250, takes Basaglar 20-25 U based on nighttime reading, advised to stay with 25 U dose for now On Ozempic 0.5 mg, increase to 1 mg dose On Metformin 500 mg QD Has Dexcom, but did not work properly and was having gross discrepancy between it and fingerstick -advised to continue fingerstick blood glucose checks for now Used to follow up with Endocrinologist in [employer] [place] On ARB and statin

## 2021-08-26 NOTE — Assessment & Plan Note (Signed)
On statin BP WNL

## 2021-08-29 DIAGNOSIS — E1140 Type 2 diabetes mellitus with diabetic neuropathy, unspecified: Secondary | ICD-10-CM | POA: Diagnosis not present

## 2021-09-06 DIAGNOSIS — R69 Illness, unspecified: Secondary | ICD-10-CM | POA: Diagnosis not present

## 2021-09-06 DIAGNOSIS — I2510 Atherosclerotic heart disease of native coronary artery without angina pectoris: Secondary | ICD-10-CM | POA: Diagnosis not present

## 2021-09-06 DIAGNOSIS — F17210 Nicotine dependence, cigarettes, uncomplicated: Secondary | ICD-10-CM

## 2021-09-06 DIAGNOSIS — I10 Essential (primary) hypertension: Secondary | ICD-10-CM | POA: Diagnosis not present

## 2021-09-06 DIAGNOSIS — Z794 Long term (current) use of insulin: Secondary | ICD-10-CM | POA: Diagnosis not present

## 2021-09-06 DIAGNOSIS — E785 Hyperlipidemia, unspecified: Secondary | ICD-10-CM

## 2021-09-06 DIAGNOSIS — E1159 Type 2 diabetes mellitus with other circulatory complications: Secondary | ICD-10-CM | POA: Diagnosis not present

## 2021-09-12 ENCOUNTER — Other Ambulatory Visit (HOSPITAL_COMMUNITY): Payer: Self-pay | Admitting: Internal Medicine

## 2021-09-12 DIAGNOSIS — Z1231 Encounter for screening mammogram for malignant neoplasm of breast: Secondary | ICD-10-CM

## 2021-09-16 ENCOUNTER — Encounter: Payer: Self-pay | Admitting: *Deleted

## 2021-09-16 DIAGNOSIS — Z2821 Immunization not carried out because of patient refusal: Secondary | ICD-10-CM | POA: Insufficient documentation

## 2021-09-21 ENCOUNTER — Ambulatory Visit (INDEPENDENT_AMBULATORY_CARE_PROVIDER_SITE_OTHER): Payer: Medicare HMO | Admitting: Pharmacist

## 2021-09-21 DIAGNOSIS — I2510 Atherosclerotic heart disease of native coronary artery without angina pectoris: Secondary | ICD-10-CM

## 2021-09-21 DIAGNOSIS — E1140 Type 2 diabetes mellitus with diabetic neuropathy, unspecified: Secondary | ICD-10-CM

## 2021-09-21 DIAGNOSIS — Z720 Tobacco use: Secondary | ICD-10-CM

## 2021-09-21 DIAGNOSIS — I10 Essential (primary) hypertension: Secondary | ICD-10-CM

## 2021-09-21 DIAGNOSIS — E785 Hyperlipidemia, unspecified: Secondary | ICD-10-CM

## 2021-09-21 NOTE — Chronic Care Management (AMB) (Signed)
Chronic Care Management Pharmacy Note  09/21/2021 Name:  Stephanie Hammond MRN:  [id number] DOB:  Dec 04, 1950  Summary: Tobacco Abuse (Current Smoker) Patient endorses that she is still smoking 6-7 cigarettes per day which is stable from last visit. Advised patient to quit smoking and offered support  Type 2 Diabetes Controlled; Most recent A1c at goal of <7% per ADA guidelines. Glucose time in range >70% per continuous glucose monitor. Recent changes: Ozempic increased to 1 mg weekly per primary care provider Most recent glucose readings: see Dexcom Clarity reports below for last 30 days. 84% of the time in target range. 3% below target range and 13% above target range. Several low blood glucose events recorded on Dexcom but patient reports that when she checks finger stick blood glucose it was in the 90-100s so she did not need to treat for hypoglycemia. Unclear explanation for this continued occurrence.  Continue metformin 1,000 mg every morning with breakfast, Basaglar 20-25 units at bedtime, and Ozempic 1 mg subcutaneously once weekly Continue as needed use of Novolog 5 units if blood glucose >300      Subjective: Stephanie Hammond is an 71 y.o. year old female who is a primary patient of Anabel Halon, MD.  The CCM team was consulted for assistance with disease management and care coordination needs.    Engaged with patient by telephone for follow up visit in response to provider referral for pharmacy case management and/or care coordination services.   Consent to Services:  The patient was given information about Chronic Care Management services, agreed to services, and gave verbal consent prior to initiation of services.  Please see initial visit note for detailed documentation.   Patient Care Team: Anabel Halon, MD as PCP - General (Internal Medicine) [place] Bali, MD (Inactive) as Consulting Physician (Gastroenterology) Gavin Pound, [facility]  (Pharmacist)  Objective:  Lab Results  Component Value Date   CREATININE 0.84 05/04/2021   CREATININE 0.87 03/19/2020   CREATININE 0.87 01/21/2020    Lab Results  Component Value Date   HGBA1C 5.7 08/26/2021   Last diabetic Eye exam:  Lab Results  Component Value Date/Time   HMDIABEYEEXA No Retinopathy 11/19/2019 12:00 AM    Last diabetic Foot exam: No results found for: HMDIABFOOTEX      Component Value Date/Time   CHOL 101 05/04/2021 1023   TRIG 69 05/04/2021 1023   HDL 44 05/04/2021 1023   CHOLHDL 2.3 05/04/2021 1023   CHOLHDL 2.5 03/19/2020 1127   VLDL 27 09/11/2016 1255   LDLCALC 42 05/04/2021 1023   LDLCALC CANCELED 10/04/2020 1301   LDLDIRECT 68 12/01/2013 1454    Hepatic Function Latest Ref Rng & Units 05/04/2021 03/19/2020 05/13/2019  Total Protein 6.0 - 8.5 g/dL 7.1 6.7 7.2  Albumin 3.8 - 4.8 g/dL 4.3 - -  AST 0 - 40 IU/L 17 12 15   ALT 0 - 32 IU/L 18 10 14   Alk Phosphatase 44 - 121 IU/L 162(H) - -  Total Bilirubin 0.0 - 1.2 mg/dL <6.2 0.3 0.3    Lab Results  Component Value Date/Time   TSH 0.588 05/04/2021 10:23 AM   TSH 0.75 09/10/2017 03:01 PM    CBC Latest Ref Rng & Units 05/04/2021 10/04/2020 05/03/2020  WBC 3.4 - 10.8 x10E3/uL 6.2 CANCELED 6.2  Hemoglobin 11.1 - 15.9 g/dL 95.2 - 10.2(L)  Hematocrit 34.0 - 46.6 % 35.7 - 31.4(L)  Platelets 150 - 450 x10E3/uL 339 - 373    No results  found for: VD25OH  Clinical ASCVD: No  The ASCVD Risk score (Arnett DK, et al., 2019) failed to calculate for the following reasons:   The valid total cholesterol range is 130 to 320 mg/dL    Social History   Tobacco Use  Smoking Status Every Day   Packs/day: 1.00   Years: 35.00   Pack years: 35.00   Types: Cigarettes  Smokeless Tobacco Never   BP Readings from Last 3 Encounters:  08/26/21 115/73  03/22/21 125/74  10/25/20 121/70   Pulse Readings from Last 3 Encounters:  08/26/21 (!) 104  03/22/21 91  10/25/20 (!) 111   Wt Readings from Last 3  Encounters:  08/26/21 208 lb (94.3 kg)  03/22/21 208 lb 1.3 oz (94.4 kg)  10/25/20 208 lb 1.9 oz (94.4 kg)    Assessment: Review of patient past medical history, allergies, medications, health status, including review of consultants reports, laboratory and other test data, was performed as part of comprehensive evaluation and provision of chronic care management services.   SDOH:  (Social Determinants of Health) assessments and interventions performed:    CCM Care Plan  Allergies  Allergen Reactions   Ace Inhibitors Cough   Bentyl [Dicyclomine Hcl] Other (See Comments)    Blurry vision   Penicillins Itching and Swelling    Whelps Did it involve swelling of the face/tongue/throat, SOB, or low BP? No Did it involve sudden or severe rash/hives, skin peeling, or any reaction on the inside of your mouth or nose? No Did you need to seek medical attention at a hospital or doctor's office? No When did it last happen?      20 years If all above answers are NO, may proceed with cephalosporin use.     Medications Reviewed Today     Reviewed by Gavin Pound, [facility] (Pharmacist) on 09/21/21 at 1046  Med List Status: <None>   Medication Order Taking? Sig Documenting Provider Last Dose Status Informant  allopurinol (ZYLOPRIM) 300 MG tablet [id number] Yes TAKE ONE TABLET BY MOUTH EVERY DAY Anabel Halon, MD Taking Active   amLODipine (NORVASC) 10 MG tablet [id number] Yes TAKE ONE TABLET BY MOUTH ONCE DAILY Anabel Halon, MD Taking Active   atorvastatin (LIPITOR) 20 MG tablet [id number] Yes TAKE ONE TABLET BY MOUTH ONCE DAILY Anabel Halon, MD Taking Active   Blood Glucose Monitoring Suppl (BLOOD GLUCOSE SYSTEM PAK) KIT [id number] Yes Please dispense based on patient and insurance preference. Use as directed to monitor FSBS 3x daily. Dx: E11.9 Donita Brooks, MD Taking Active   BUTRANS 15 MCG/HR [id number] Yes Place 1 patch onto the skin once a week. [provider] Taking  Active   Continuous Blood Gluc Sensor (DEXCOM G6 SENSOR) MISC [id number] Yes Apply new sensor every 10 days [provider] Taking Active Self  Continuous Blood Gluc Transmit (DEXCOM G6 TRANSMITTER) MISC [id number] Yes Use 1 transmitter every 90 days [provider] Taking Active Self  DULoxetine (CYMBALTA) 60 MG capsule [id number] Yes Take 60 mg by mouth daily. [provider] Taking Active   Glucose Blood (BLOOD GLUCOSE TEST STRIPS) STRP [id number] Yes Please dispense based on patient and insurance preference. Use as directed to monitor FSBS 3x daily. Dx: E11.9  For testing 4 times daily Anabel Halon, MD Taking Active   Insulin Glargine [facility]) 100 UNIT/ML [id number] Yes Inject 25 Units into the skin at bedtime.  Patient taking differently: Inject 20-25 Units into the skin at bedtime.   [place],  Velna Hatchet, MD Taking Active   Insulin Pen Needle (B-D UF III MINI PEN NEEDLES) 31G X 5 MM MISC [id number] Yes FOR USE with INSULIN PENS Donita Brooks, MD Taking Active   Lancets MISC [id number] Yes Please dispense based on patient and insurance preference. Use as directed to monitor FSBS 3x daily. Dx: E11.9  For testing 4 times daily Anabel Halon, MD Taking Active   losartan (COZAAR) 100 MG tablet [id number] Yes TAKE ONE TABLET BY MOUTH ONCE DAILY Anabel Halon, MD Taking Active   metFORMIN (GLUCOPHAGE-XR) 500 MG 24 hr tablet [id number] Yes TAKE 2 TABLETS(1000 MG) BY MOUTH DAILY WITH BREAKFAST Anabel Halon, MD Taking Active   NOVOLOG FLEXPEN 100 UNIT/ML FlexPen [id number] Yes Sliding Scale: Blood Sugar of 150-200 = TWO units, 201-250 = 4units, 251-300 = 6units, 301-350 = 8units, 351-400 = 10units Anabel Halon, MD Taking Active            Med Note Berle Mull, [place]ary Brim   Wed Aug 24, 2021  1:30 PM) 5 units as needed for blood glucose >300  NURTEC 75 MG TBDP [id number] No Take 1 tablet by mouth daily as needed.  Patient not taking: Reported on 09/21/2021    [provider] Not Taking Active   omeprazole (PRILOSEC) 20 MG capsule [id number] Yes TAKE 1 CAPSULE BY MOUTH EVERY DAY IN THE [place], Velna Hatchet, MD Taking Active   oxyCODONE-acetaminophen (PERCOCET) 10-325 MG tablet [id number] Yes Take 1 tablet by mouth 2 (two) times daily. [provider] Taking Active   Semaglutide, 1 MG/DOSE, 4 MG/3ML SOPN [id number] Yes Inject 1 mg as directed once a week. Anabel Halon, MD Taking Active   topiramate (TOPAMAX) 25 MG tablet [id number] Yes Take 50 mg by mouth daily. [provider] Taking Active             Patient Active Problem List   Diagnosis Date Noted   Influenza vaccine refused 09/16/2021   Encounter for general adult medical examination with abnormal findings 03/22/2021   Hardening of the aorta (main artery of the heart) (HCC) 10/25/2020   Chronic pain syndrome 10/25/2020   Insomnia 08/18/2020   Colovaginal fistula 05/13/2019   History of diverticulitis 03/19/2019   Failed total knee, left, initial encounter (HCC) 01/09/2019   Anemia 12/31/2015   Arthritis of knee, degenerative 03/12/2015   Arthritis of left knee    Gout 09/07/2014   Back pain with left-sided radiculopathy 09/07/2014   Type 2 diabetes mellitus with diabetic neuropathy (HCC) 02/14/2014   Hyperlipidemia 03/12/2013   IBS (irritable bowel syndrome) 01/09/2013   Diabetic neuropathy (HCC) 11/14/2012   Major depression, single episode 09/13/2012   Essential hypertension, benign 06/09/2012   Sinus tachycardia 03/28/2012   DDD (degenerative disc disease), lumbar 03/28/2012   Chronic knee pain 03/28/2012   Obesity (BMI 30-39.9) 03/28/2012   Tobacco abuse 03/28/2012   CAD (coronary artery disease) 03/28/2012    Immunization History  Administered Date(s) Administered   Moderna Sars-Covid-2 Vaccination 10/01/2019, 10/29/2019, 07/22/2020   PPD Test 09/03/2017, 06/08/2021   Zoster Recombinat (Shingrix) 11/07/2017, 11/07/2017    Conditions  to be addressed/monitored: CAD, HTN, HLD, DMII, and Tobacco Use  Care Plan : Medication Management  Updates made by Gavin Pound, RPH since 09/21/2021 12:00 AM     Problem: T2DM, HTN, CAD/HLD, Tobacco Use   Priority: High  Onset Date: 04/07/2021     Long-Range Goal: Disease Progression Prevention   Start Date: 04/07/2021  Expected End Date:  07/06/2021  Recent Progress: On track  Priority: High  Note:   Current Barriers:  Unable to independently monitor therapeutic efficacy  Pharmacist Clinical Goal(s):  Through collaboration with PharmD and provider, patient will:  Achieve adherence to monitoring guidelines and medication adherence to achieve therapeutic efficacy   Interventions: 1:1 collaboration with Anabel Halon, MD regarding development and update of comprehensive plan of care as evidenced by provider attestation and co-signature Inter-disciplinary care team collaboration (see longitudinal plan of care) Comprehensive medication review performed; medication list updated in electronic medical record  Type 2 Diabetes - goal on track (progressing): YES: Controlled; Most recent A1c at goal of <7% per ADA guidelines. Glucose time in range >70% per continuous glucose monitor. Current medications: metformin 1,000 mg by mouth with breakfast, semaglutide (Ozempic) 1 mg subcutaneously weekly, and insulin glargine (Basaglar) 20-25 units subcutaneously once daily and insulin aspart (Novolog) 5 units as needed for blood glucose >300 Recent changes: Ozempic increased to 1 mg weekly per primary care provider Intolerances: none Taking medications as directed: yes Side effects thought to be attributed to current medication regimen: no Hypoglycemia prevention: not indicated at this time Current meal patterns: breakfast: patient reports she has cut our cereal and fruit for breakfast and is now eating a boiled egg and yogurt.  Current exercise: not discussed today On a statin: [x]  Yes   []  No    Last microalbumin: 0.9 (10/04/20); on an ACEi/ARB: [x]  Yes  []  No    Most recent glucose readings: see Dexcom Clarity reports below for last 30 days. 84% of the time in target range. 3% below target range and 13% above target range. Several low blood glucose events recorded on Dexcom but patient reports that when she checks finger stick blood glucose it was in the 90-100s so she did not need to treat for hypoglycemia. Unclear explanation for this continued occurrence. Continue monitoring blood glucose continuously with Dexcom G6 unless symptoms do not match sensor glucose then check ginger stick blood glucose.  Continue metformin 1,000 mg every morning with breakfast, Basaglar 20-25 units at bedtime, and Ozempic 1 mg subcutaneously once weekly Continue as needed use of Novolog 5 units if blood glucose >300      Hypertension - condition stable not addressed at this visit: Blood pressure under good control. Blood pressure is at goal of <130/80 mmHg per 2017 AHA/ACC guidelines. Current medications: losartan 100 mg by mouth once daily and amlodipine 10 mg by mouth once daily Intolerances:  Ace inhibitor (cough) Taking medications as directed: yes Side effects thought to be attributed to current medication regimen: no Home blood pressure readings: not discussed today Continue losartan 100 mg by mouth once daily and amlodipine 10 mg by mouth once daily Encourage dietary sodium restriction/DASH diet Recommend regular aerobic exercise Recommend home blood pressure monitoring to discuss at next visit  Coronary artery disease/Hyperlipidemia  - condition stable not addressed at this visit: Controlled; LDL at goal of <70 due to very high risk given diabetes + at least 1 additional major risk factor (hypertension and cigarette smoking) per 2020 AACE/ACE guidelines and TG at goal of <150 per 2020 AACE/ACE guidelines Current medications: atorvastatin 20 mg by mouth once daily Intolerances:  none Taking medications as directed: yes Side effects thought to be attributed to current medication regimen: no Continue atorvastatin 20 mg by mouth once daily Recommend regular aerobic exercise Smoking cessation is advised. Offered support.   Tobacco Abuse (Current Smoker) - Goal on Track (progressing): YES.: Patient endorses that  she is still smoking 6-7 cigarettes per day which is stable from last visit. Previous quit attempts: successful quitting cold [place] but has frequent relapses; patient does not think she has ever tried pharmacologic therapy to assist her; has spoken with [facility] [place] in the past and she states "they drove me crazy calling me all the time" Current medication management: none Known triggers: stress Barriers: under a lot of stress now Advised patient to quit smoking and offered support Patient wants to try to quit cold [place] over next few weeks. She was encouraged to keep momentum going.  May consider nicotine replacement therapy, Chantix or Wellbutrin if patient desires to use pharmacological therapy to assist her efforts.   Patient Goals/Self-Care Activities Patient will:  Take medications as prescribed Check blood sugar continuously with continuous glucose monitor, document, and provide at future appointments Check blood pressure at least once daily, document, and provide at future appointments Target a minimum of 150 minutes of moderate intensity exercise weekly Engage in dietary modifications by fewer sweetened foods & beverages Work on stopping or reducing smoking  Follow Up Plan: Telephone follow up appointment with care management team member scheduled for: 10/25/21      Medication Assistance: None required.  Patient affirms current coverage meets needs.  Patient's preferred pharmacy is:  [facility] [place], [place] [address] [place] [address] Phone: [phone] Fax: [phone]  [id number] - [place], [place]  - [address] [place] [address] Phone: [phone] Fax: [phone]  Follow Up:  Patient agrees to Care Plan and Follow-up.  Plan: Telephone follow up appointment with care management team member scheduled for:  10/25/21  Domenic Moras, PharmD, BCACP, CPP Clinical Pharmacist Practitioner [facility] [phone]

## 2021-09-21 NOTE — Patient Instructions (Signed)
[name],  It was great to talk to you today!  Call Dexcom customer support [phone]) or [employer] [phone]) for replacement sensors due to MRI and mammogram  Please call me with any questions or concerns.  Visit Information  Following are the goals we discussed today:   Goals Addressed             This Visit's Progress    Medication Management       Patient Goals/Self-Care Activities Patient will:  Take medications as prescribed Check blood sugar continuously with continuous glucose monitor, document, and provide at future appointments Check blood pressure at least once daily, document, and provide at future appointments Target a minimum of 150 minutes of moderate intensity exercise weekly Engage in dietary modifications by fewer sweetened foods & beverages Work on stopping or reducing smoking         Follow-up plan: Telephone follow up appointment with care management team member scheduled for:  10/25/21 Future Appointments  Date Time Provider [place]  10/03/2021 12:30 PM AP-MM 1 AP-MM  H  10/25/2021 10:30 AM RPC-CCM PHARMACIST RPC-RPC R[facility]  12/23/2021 11:00 AM Lindell Spar, MD RPC-RPC [facility]  03/03/2022  8:00 AM RPC-RPC NURSE RPC-RPC RPC    The patient verbalized understanding of instructions, educational materials, and care plan provided today and agreed to receive a mailed copy of patient instructions, educational materials, and care plan.   Please call the care guide team at [phone] if you need to cancel or reschedule your appointment.   Kennon Holter, PharmD, [place], CPP Clinical Pharmacist Practitioner [facility] [phone]

## 2021-09-29 DIAGNOSIS — E1140 Type 2 diabetes mellitus with diabetic neuropathy, unspecified: Secondary | ICD-10-CM | POA: Diagnosis not present

## 2021-10-03 ENCOUNTER — Ambulatory Visit (HOSPITAL_COMMUNITY): Payer: Medicare HMO

## 2021-10-03 DIAGNOSIS — M542 Cervicalgia: Secondary | ICD-10-CM | POA: Diagnosis not present

## 2021-10-03 DIAGNOSIS — M25561 Pain in right knee: Secondary | ICD-10-CM | POA: Diagnosis not present

## 2021-10-03 DIAGNOSIS — G43709 Chronic migraine without aura, not intractable, without status migrainosus: Secondary | ICD-10-CM | POA: Diagnosis not present

## 2021-10-03 DIAGNOSIS — M5451 Vertebrogenic low back pain: Secondary | ICD-10-CM | POA: Diagnosis not present

## 2021-10-03 DIAGNOSIS — Z79891 Long term (current) use of opiate analgesic: Secondary | ICD-10-CM | POA: Diagnosis not present

## 2021-10-03 DIAGNOSIS — M25562 Pain in left knee: Secondary | ICD-10-CM | POA: Diagnosis not present

## 2021-10-04 ENCOUNTER — Ambulatory Visit: Payer: Medicare HMO

## 2021-10-04 DIAGNOSIS — M5451 Vertebrogenic low back pain: Secondary | ICD-10-CM | POA: Diagnosis not present

## 2021-10-04 DIAGNOSIS — Z794 Long term (current) use of insulin: Secondary | ICD-10-CM

## 2021-10-04 DIAGNOSIS — I2510 Atherosclerotic heart disease of native coronary artery without angina pectoris: Secondary | ICD-10-CM | POA: Diagnosis not present

## 2021-10-04 DIAGNOSIS — E1140 Type 2 diabetes mellitus with diabetic neuropathy, unspecified: Secondary | ICD-10-CM

## 2021-10-04 DIAGNOSIS — I10 Essential (primary) hypertension: Secondary | ICD-10-CM

## 2021-10-04 DIAGNOSIS — E785 Hyperlipidemia, unspecified: Secondary | ICD-10-CM | POA: Diagnosis not present

## 2021-10-04 DIAGNOSIS — Z79891 Long term (current) use of opiate analgesic: Secondary | ICD-10-CM | POA: Diagnosis not present

## 2021-10-14 ENCOUNTER — Ambulatory Visit (HOSPITAL_COMMUNITY): Payer: Medicare HMO

## 2021-10-25 ENCOUNTER — Telehealth: Payer: Medicare HMO

## 2021-10-31 ENCOUNTER — Telehealth: Payer: Self-pay

## 2021-10-31 DIAGNOSIS — E1140 Type 2 diabetes mellitus with diabetic neuropathy, unspecified: Secondary | ICD-10-CM | POA: Diagnosis not present

## 2021-10-31 NOTE — Telephone Encounter (Signed)
Patient asked if pharmacist would giver her a call back [phone]. ?

## 2021-11-01 ENCOUNTER — Ambulatory Visit: Payer: Medicare HMO

## 2021-11-03 ENCOUNTER — Telehealth: Payer: Medicare HMO

## 2021-11-03 ENCOUNTER — Telehealth: Payer: Self-pay | Admitting: Pharmacist

## 2021-11-03 NOTE — Telephone Encounter (Signed)
?  Care Management  ? ?Follow Up Note ? ?11/03/2021 ?Name: AZALYA PREE MRN: [id number] DOB: Oct 19, 1950 ? ?Referred by: Anabel Halon, MD ?Reason for referral : Chronic Care Management (Unsuccessful;l Telephone Outreach) ? ?An unsuccessful telephone outreach was attempted today. The patient was referred to the case management team for assistance with care management and care coordination.  ? ?Follow Up Plan: Next PCP appointment scheduled for: 12/23/21 ? ?Domenic Moras, PharmD, BCACP, CPP ?Clinical Pharmacist Practitioner ?[facility] ?[phone] ? ? ?

## 2021-11-09 ENCOUNTER — Ambulatory Visit (INDEPENDENT_AMBULATORY_CARE_PROVIDER_SITE_OTHER): Payer: Medicare HMO | Admitting: Internal Medicine

## 2021-11-09 VITALS — BP 118/60 | HR 115 | Temp 97.5°F | Ht 63.0 in | Wt 207.8 lb

## 2021-11-09 DIAGNOSIS — R197 Diarrhea, unspecified: Secondary | ICD-10-CM | POA: Diagnosis not present

## 2021-11-09 DIAGNOSIS — R159 Full incontinence of feces: Secondary | ICD-10-CM

## 2021-11-09 DIAGNOSIS — R1033 Periumbilical pain: Secondary | ICD-10-CM | POA: Diagnosis not present

## 2021-11-09 NOTE — Patient Instructions (Signed)
I am unsure what is causing your chronic diarrhea.  I am going to order blood and stool tests to further evaluate at [facility]. ? ?We may need to consider colonoscopy in the future. ? ?I want you to start taking Imodium 20 minutes before meals. ? ?Also want you to start implementing fiber in your diet.  Recommended 25 to 35 g a day.  Can use over-the-counter Metamucil or Benefiber. ? ?I will give you information on a low FODMAP diet which may give you an idea of certain foods to avoid. ? ?Follow-up in 2 to 3 months. ? ?It is very nice meeting you today. ? ?Dr. Abbey Chatters ? ?At [facility] we value your feedback. You may receive a survey about your visit today. Please share your experience as we strive to create trusting relationships with our patients to provide genuine, compassionate, quality care. ? ?We appreciate your understanding and patience as we review any laboratory studies, imaging, and other diagnostic tests that are ordered as we care for you. Our office policy is 5 business days for review of these results, and any emergent or urgent results are addressed in a timely manner for your best interest. If you do not hear from our office in 1 week, please contact us.  ? ?We also encourage the use of MyChart, which contains your medical information for your review as well. If you are not enrolled in this feature, an access code is on this after visit summary for your convenience. Thank you for allowing [place] to be involved in your care. ? ?It was great to see you today!  I hope you have a great rest of your Spring! ? ? ? ?Stephanie Hammond. Abbey Chatters, D.O. ?Gastroenterology and Hepatology ?[facility] ? ?

## 2021-11-09 NOTE — Progress Notes (Signed)
? ? ?Referring Provider: Lindell Spar, MD ?Primary Care Physician:  Lindell Spar, MD ?Primary GI:  Dr. Abbey Chatters ? ?Chief Complaint  ?Patient presents with  ? Diarrhea  ?  RM #2  ? ? ?HPI:   ?Stephanie Hammond is a 71 y.o. female who presents to the clinic today to clinic today for follow-up visit.  Last seen in our office August 2020.  History of recurrent diverticulitis with rectovaginal fistula status post segmental colon resection and fistula repair at [facility] January 2021. ? ?Presents today for chronic diarrhea.  Patient states after her surgeries, she did well for almost a year and then she began having diarrhea.  This has been going on since December 2021.  Notes some days she will have 4-5 loose bowel movements.  Primarily after meals as well.  Has been taking Pepto-Bismol around-the-clock without much relief.  Notes some periumbilical abdominal pain as well.  No melena hematochezia.  Does note fecal incontinence regularly.  This has affected her quality of life immensely as she is scared to leave her house.  Status post cholecystectomy.  No unintentional weight loss. ? ?Last colonoscopy performed prior to surgical resection on 04/29/2019.  Found to have 1 tubular adenoma removed, significant diverticulosis. ? ?Past Medical History:  ?Diagnosis Date  ? Anemia   ? Blood transfusion 2009  ? after GI bleed  ? CAD (coronary artery disease)   ? Diabetes mellitus   ? 2  ? Diverticulitis   ? Diverticulosis   ? Fear of local anesthetic   ? 01-02-2019, patient states with last surgery in 2016 , she refused to have epidural before she was sedated d/t to extreme aversion to needles near her back    ? GERD (gastroesophageal reflux disease)   ? Gout   ? Headache   ? occ if sugar is  too hi or low   ? Heart murmur   ? "i was told this years and years and years ago "  ? History of GI diverticular bleed 08/2004  ? Hypertension   ? IBS (irritable bowel syndrome)   ? Osteoarthritis   ? Tachycardia   ? "it stays  like that for years between 119 and 121 and it doesnt bother me  none "   ? ? ?Past Surgical History:  ?Procedure Laterality Date  ? CHOLECYSTECTOMY  2003  ? COLON SURGERY    ? COLONOSCOPY  2000  ? Dr. Fuller Plan: marked diverticulosis, difficult procedure due to adhesions, polyps benign  ? COLONOSCOPY  2006  ? Dr. Henrene Pastor: severe pandiverticulosis  ? COLONOSCOPY N/A 02/03/2013  ? SLF: 4 COLORECTAL POLYPS REMOVED/Moderate diverticulosis throughout the entire examined colon/Small internal hemorrhoids  ? COLONOSCOPY WITH PROPOFOL N/A 04/29/2019  ? Procedure: COLONOSCOPY WITH PROPOFOL;  Surgeon: Danie Binder, MD;  Location: [facility];  Service: Endoscopy;  Laterality: N/A;  8:30am  ? JOINT REPLACEMENT Right 2009  ? Knee Replacement Right   ? POLYPECTOMY  04/29/2019  ? Procedure: POLYPECTOMY;  Surgeon: Danie Binder, MD;  Location: [facility];  Service: Endoscopy;;  colon  ? PTCA    ? over 10 years ago , unsure of exact year , does recall that no stents were placed   ? REPLACEMENT TOTAL KNEE    ? right   ? TOTAL KNEE ARTHROPLASTY Left 03/12/2015  ? Procedure: LEFT TOTAL KNEE REPLACEMENT;  Surgeon: Carole Civil, MD;  Location: [facility];  Service: Orthopedics;  Laterality: Left;  ? TOTAL  KNEE REVISION Left 01/09/2019  ? Procedure: TOTAL KNEE REVISION;  Surgeon: Rod Can, MD;  Location: [facility];  Service: Orthopedics;  Laterality: Left;  ? VAGINAL HYSTERECTOMY    ? ? ?Current Outpatient Medications  ?Medication Sig Dispense Refill  ? allopurinol (ZYLOPRIM) 300 MG tablet TAKE ONE TABLET BY MOUTH EVERY DAY 90 tablet 0  ? amLODipine (NORVASC) 10 MG tablet TAKE ONE TABLET BY MOUTH ONCE DAILY 90 tablet 1  ? atorvastatin (LIPITOR) 20 MG tablet TAKE ONE TABLET BY MOUTH ONCE DAILY 90 tablet 1  ? Blood Glucose Monitoring Suppl (BLOOD GLUCOSE SYSTEM PAK) KIT Please dispense based on patient and insurance preference. Use as directed to monitor FSBS 3x daily. Dx: E11.9 1 kit 1  ? BUTRANS 15 MCG/HR Place 1 patch onto the skin  once a week.    ? Continuous Blood Gluc Sensor (DEXCOM G6 SENSOR) MISC Apply new sensor every 10 days    ? Continuous Blood Gluc Transmit (DEXCOM G6 TRANSMITTER) MISC Use 1 transmitter every 90 days    ? DULoxetine (CYMBALTA) 60 MG capsule Take 60 mg by mouth daily.    ? Glucose Blood (BLOOD GLUCOSE TEST STRIPS) STRP Please dispense based on patient and insurance preference. Use as directed to monitor FSBS 3x daily. Dx: E11.9 ? ?For testing 4 times daily 100 strip 11  ? Insulin Glargine (BASAGLAR KWIKPEN) 100 UNIT/ML Inject 25 Units into the skin at bedtime. (Patient taking differently: Inject 20-25 Units into the skin at bedtime.) 15 mL 3  ? Insulin Pen Needle (B-D UF III MINI PEN NEEDLES) 31G X 5 MM MISC FOR USE with INSULIN PENS 100 each 10  ? Lancets MISC Please dispense based on patient and insurance preference. Use as directed to monitor FSBS 3x daily. Dx: E11.9 ? ?For testing 4 times daily 100 each 11  ? losartan (COZAAR) 100 MG tablet TAKE ONE TABLET BY MOUTH ONCE DAILY 90 tablet 1  ? metFORMIN (GLUCOPHAGE-XR) 500 MG 24 hr tablet TAKE 2 TABLETS(1000 MG) BY MOUTH DAILY WITH BREAKFAST 180 tablet 2  ? NOVOLOG FLEXPEN 100 UNIT/ML FlexPen Sliding Scale: Blood Sugar of 150-200 = TWO units, 201-250 = 4units, 251-300 = 6units, 301-350 = 8units, 351-400 = 10units 15 mL 10  ? omeprazole (PRILOSEC) 20 MG capsule TAKE 1 CAPSULE BY MOUTH EVERY DAY IN THE MORNING 90 capsule 3  ? oxyCODONE-acetaminophen (PERCOCET) 10-325 MG tablet Take 1 tablet by mouth 2 (two) times daily.    ? Semaglutide, 1 MG/DOSE, 4 MG/3ML SOPN Inject 1 mg as directed once a week. 3 mL 3  ? topiramate (TOPAMAX) 25 MG tablet Take 50 mg by mouth daily.    ? NURTEC 75 MG TBDP Take 1 tablet by mouth daily as needed. (Patient not taking: Reported on 09/21/2021)    ? ?No current facility-administered medications for this visit.  ? ? ?Allergies as of 11/09/2021 - Review Complete 11/09/2021  ?Allergen Reaction Noted  ? Ace inhibitors Cough 08/15/2012  ? Bentyl  [dicyclomine hcl] Other (See Comments) 02/03/2013  ? Penicillins Itching and Swelling 03/28/2011  ? ? ?Family History  ?Problem Relation Age of Onset  ? Colon polyps Sister   ? Cancer Mother   ?     lung   ? Diabetes Mother   ? Cancer Father   ?     stomach   ? Stomach cancer Father   ?     questionable  ? Colon cancer Neg Hx   ? ? ?Social History  ? ?Socioeconomic  History  ? Marital status: Married  ?  Spouse name: Not on file  ? Number of children: 2  ? Years of education: Not on file  ? Highest education level: Not on file  ?Occupational History  ? Not on file  ?Tobacco Use  ? Smoking status: Every Day  ?  Packs/day: 1.00  ?  Years: 35.00  ?  Pack years: 35.00  ?  Types: Cigarettes  ? Smokeless tobacco: Never  ?Vaping Use  ? Vaping Use: Never used  ?Substance and Sexual Activity  ? Alcohol use: No  ? Drug use: No  ? Sexual activity: Not Currently  ?  Birth control/protection: Surgical  ?  Comment: hyst  ?Other Topics Concern  ? Not on file  ?Social History Narrative  ? Husband passed away lives alone   ? ?Social Determinants of Health  ? ?Financial Resource Strain: Low Risk   ? Difficulty of Paying Living Expenses: Not hard at all  ?Food Insecurity: No Food Insecurity  ? Worried About Charity fundraiser in the Last Year: Never true  ? Ran Out of Food in the Last Year: Never true  ?Transportation Needs: No Transportation Needs  ? Lack of Transportation (Medical): No  ? Lack of Transportation (Non-Medical): No  ?Physical Activity: Inactive  ? Days of Exercise per Week: 0 days  ? Minutes of Exercise per Session: 0 min  ?Stress: No Stress Concern Present  ? Feeling of Stress : Not at all  ?Social Connections: Moderately Isolated  ? Frequency of Communication with Friends and Family: More than three times a week  ? Frequency of Social Gatherings with Friends and Family: More than three times a week  ? Attends Religious Services: More than 4 times per year  ? Active Member of Clubs or Organizations: No  ? Attends English as a second language teacher Meetings: Never  ? Marital Status: Widowed  ? ? ?Subjective: ?Review of Systems  ?Constitutional:  Negative for chills and fever.  ?HENT:  Negative for congestion and hearing loss.   ?

## 2021-11-11 ENCOUNTER — Other Ambulatory Visit: Payer: Self-pay | Admitting: Internal Medicine

## 2021-11-16 ENCOUNTER — Other Ambulatory Visit: Payer: Self-pay | Admitting: Internal Medicine

## 2021-11-28 DIAGNOSIS — M5451 Vertebrogenic low back pain: Secondary | ICD-10-CM | POA: Diagnosis not present

## 2021-11-28 DIAGNOSIS — M545 Low back pain, unspecified: Secondary | ICD-10-CM | POA: Diagnosis not present

## 2021-11-28 DIAGNOSIS — M542 Cervicalgia: Secondary | ICD-10-CM | POA: Diagnosis not present

## 2021-11-28 DIAGNOSIS — Z79899 Other long term (current) drug therapy: Secondary | ICD-10-CM | POA: Diagnosis not present

## 2021-11-28 DIAGNOSIS — Z79891 Long term (current) use of opiate analgesic: Secondary | ICD-10-CM | POA: Diagnosis not present

## 2021-11-28 DIAGNOSIS — M25561 Pain in right knee: Secondary | ICD-10-CM | POA: Diagnosis not present

## 2021-11-28 DIAGNOSIS — E559 Vitamin D deficiency, unspecified: Secondary | ICD-10-CM | POA: Diagnosis not present

## 2021-11-28 DIAGNOSIS — I776 Arteritis, unspecified: Secondary | ICD-10-CM | POA: Diagnosis not present

## 2021-11-28 DIAGNOSIS — G4733 Obstructive sleep apnea (adult) (pediatric): Secondary | ICD-10-CM | POA: Diagnosis not present

## 2021-11-28 DIAGNOSIS — G4459 Other complicated headache syndrome: Secondary | ICD-10-CM | POA: Diagnosis not present

## 2021-11-28 DIAGNOSIS — M25562 Pain in left knee: Secondary | ICD-10-CM | POA: Diagnosis not present

## 2021-11-28 DIAGNOSIS — R7303 Prediabetes: Secondary | ICD-10-CM | POA: Diagnosis not present

## 2021-12-01 DIAGNOSIS — E1140 Type 2 diabetes mellitus with diabetic neuropathy, unspecified: Secondary | ICD-10-CM | POA: Diagnosis not present

## 2021-12-19 ENCOUNTER — Ambulatory Visit (HOSPITAL_COMMUNITY)
Admission: RE | Admit: 2021-12-19 | Discharge: 2021-12-19 | Disposition: A | Discharge status: Home / Self Care | Payer: Medicare HMO | Source: Ambulatory Visit | Attending: Internal Medicine | Admitting: Internal Medicine

## 2021-12-19 DIAGNOSIS — Z1231 Encounter for screening mammogram for malignant neoplasm of breast: Secondary | ICD-10-CM | POA: Insufficient documentation

## 2021-12-23 ENCOUNTER — Encounter: Payer: Self-pay | Admitting: Internal Medicine

## 2021-12-23 ENCOUNTER — Ambulatory Visit (INDEPENDENT_AMBULATORY_CARE_PROVIDER_SITE_OTHER): Payer: Medicare HMO | Admitting: Internal Medicine

## 2021-12-23 ENCOUNTER — Ambulatory Visit (INDEPENDENT_AMBULATORY_CARE_PROVIDER_SITE_OTHER): Payer: Medicare HMO | Admitting: *Deleted

## 2021-12-23 VITALS — BP 118/68 | HR 102 | Ht 63.0 in | Wt 207.8 lb

## 2021-12-23 DIAGNOSIS — I10 Essential (primary) hypertension: Secondary | ICD-10-CM | POA: Diagnosis not present

## 2021-12-23 DIAGNOSIS — E1140 Type 2 diabetes mellitus with diabetic neuropathy, unspecified: Secondary | ICD-10-CM | POA: Diagnosis not present

## 2021-12-23 DIAGNOSIS — Z2821 Immunization not carried out because of patient refusal: Secondary | ICD-10-CM | POA: Diagnosis not present

## 2021-12-23 DIAGNOSIS — E162 Hypoglycemia, unspecified: Secondary | ICD-10-CM | POA: Diagnosis not present

## 2021-12-23 DIAGNOSIS — Z720 Tobacco use: Secondary | ICD-10-CM

## 2021-12-23 DIAGNOSIS — Z794 Long term (current) use of insulin: Secondary | ICD-10-CM | POA: Diagnosis not present

## 2021-12-23 DIAGNOSIS — E785 Hyperlipidemia, unspecified: Secondary | ICD-10-CM

## 2021-12-23 LAB — POCT GLYCOSYLATED HEMOGLOBIN (HGB A1C)
HbA1c, POC (controlled diabetic range): 5 % (ref 0.0–7.0)
Hemoglobin A1C: 5 % (ref 4.0–5.6)

## 2021-12-23 MED ORDER — GVOKE HYPOPEN 2-PACK 1 MG/0.2ML ~~LOC~~ SOAJ: 0.2000 mL | SUBCUTANEOUS | 11 refills | Status: DC | PRN

## 2021-12-23 MED ORDER — SEMAGLUTIDE (2 MG/DOSE) 8 MG/3ML ~~LOC~~ SOPN: 2.0000 mg | PEN_INJECTOR | SUBCUTANEOUS | 3 refills | Status: DC

## 2021-12-23 NOTE — Assessment & Plan Note (Signed)
On Atorvastatin 

## 2021-12-23 NOTE — Assessment & Plan Note (Signed)
Lab Results  Component Value Date   HGBA1C 5.0 12/23/2021   HGBA1C 5.0 12/23/2021    On Basaglar 25 U qHS and Novolog 5 U PRN for blood glucose > 250, takes Basaglar 20-25 U based on nighttime reading, advised to stay with 10 U dose for now On Ozempic 1 mg, increase to 2 mg dose in an effort to decrease insulin need On Metformin 1000 mg QD Have high suspicion of hypoglycemia episodes at home, as she has headache and dizziness at times - Gvoke prescribed Has Dexcom, but did not work properly and was having gross discrepancy between it and fingerstick -advised to continue fingerstick blood glucose checks for now Used to follow up with Endocrinologist in [place] On ARB and statin

## 2021-12-23 NOTE — Assessment & Plan Note (Signed)
Smokes about 1 pack/day  Asked about quitting: confirms that she currently smokes cigarettes Advise to quit smoking: Educated about QUITTING to reduce the risk of cancer, cardio and cerebrovascular disease. Assess willingness: Unwilling to quit at this time, but is working on cutting back. Assist with counseling and pharmacotherapy: Counseled for 5 minutes and literature provided. Arrange for follow up: Follow up in 3 months and continue to offer help. 

## 2021-12-23 NOTE — Patient Instructions (Signed)
Visit Information   Thank you for taking time to visit with me today. Please don't hesitate to contact me if I can be of assistance to you before our next scheduled telephone appointment.  Following are the goals we discussed today:  Take medications as prescribed   Attend all scheduled provider appointments Call pharmacy for medication refills 3-7 days in advance of running out of medications Attend church or other social activities Perform all self care activities independently  Perform IADL's (shopping, preparing meals, housekeeping, managing finances) independently Call provider office for new concerns or questions  check blood sugar at prescribed times: 4 times daily check feet daily for cuts, sores or redness enter blood sugar readings and medication or insulin into daily log take the blood sugar log to all doctor visits take the blood sugar meter to all doctor visits trim toenails straight across fill half of plate with vegetables limit fast food meals to no more than 1 per week check blood pressure weekly choose a place to take my blood pressure (home, clinic or office, retail store) write blood pressure results in a log or diary keep a blood pressure log take blood pressure log to all doctor appointments take medications for blood pressure exactly as prescribed Try to get outside daily and walk- even a few minutes helps Follow low sodium and carbohydrate modified diet, read labels Look over education mailed- low sodium diet, hypoglycemia Advanced directives packet mailed- please review and complete If you are ever interested in smoking cessation, please let RN care manager know  Our next appointment is by telephone on 03/07/22 at 130 pm  Please call the care guide team at [phone] if you need to cancel or reschedule your appointment.   If you are experiencing a Mental Health or [place] or need someone to talk to, please call the Suicide and Crisis  Lifeline: 988 call the [place] National Suicide Prevention Lifeline: 604-650-2823 or TTY: 906-708-5614 TTY 681-377-7044) to talk to a trained counselor call 1-800-273-TALK (toll free, 24 hour hotline) go to [facility] [place] [phone]) call the [place]: [phone] call 911   Following is a copy of your full care plan:  Care Plan : [place] of Care  Updates made by Kassie Mends, RN since 12/23/2021 12:00 AM     Problem: No plan of care established for management of chronic disease state  (DM2, HTN)   Priority: High     Long-Range Goal: Development of plan of care for chronic disease management  (DM2, HTN)   Start Date: 12/23/2021  Expected End Date: 06/21/2022  Priority: High  Note:   Current Barriers:  Knowledge Deficits related to plan of care for management of HTN and DMII  Chronic Disease Management support and education needs related to HTN and DMII No Advanced Directives in place- pt requests information be mailed Patient reports she lives alone, is independent in all aspects of her care, has a son and other family she can call on if needed. Patient states she checks CBG 4 x day and is not using Dexcom anymore due to incorrect readings, pt has been working with CCM pharmacist on this issue and machine recalibrated but still does not work correctly and pt feels it is too much trouble and feels the fingerstick readings are more reliable, pt reports fasting ranges low 100's with today's reading 128, random ranges 190-210, pt has a blood pressure cuff but does  not monitor blood pressure, pt states she does not follow a special diet and does not exercise, continues to smoke 1 ppd and not interested in smoking cessation at present.  RNCM Clinical Goal(s):  Patient will verbalize understanding of plan for management of HTN and DMII as evidenced by patient report, review of EHR and  through  collaboration with RN Care manager, provider, and care team.   Interventions: 1:1 collaboration with primary care provider regarding development and update of comprehensive plan of care as evidenced by provider attestation and co-signature Inter-disciplinary care team collaboration (see longitudinal plan of care) Evaluation of current treatment plan related to  self management and patient's adherence to plan as established by provider   Diabetes Interventions:  (Status:  New goal. and Goal on track:  Yes.) Long Term Goal Assessed patient's understanding of A1c goal: <7% Provided education to patient about basic DM disease process Reviewed medications with patient and discussed importance of medication adherence Counseled on importance of regular laboratory monitoring as prescribed Discussed plans with patient for ongoing care management follow up and provided patient with direct contact information for care management team Provided patient with written educational materials related to hypo and hyperglycemia and importance of correct treatment Review of patient status, including review of consultants reports, relevant laboratory and other test results, and medications completed Screening for signs and symptoms of depression related to chronic disease state  Assessed social determinant of health barriers Reviewed carbohydrate modified diet Education mailed- hypoglycemia Offered smoking cessation resources Lab Results  Component Value Date   HGBA1C 5.7 08/26/2021   Hypertension Interventions:  (Status:  New goal. and Goal on track:  Yes.) Long Term Goal Last practice recorded BP readings:  BP Readings from Last 3 Encounters:  11/09/21 118/60  08/26/21 115/73  03/22/21 125/74  Most recent eGFR/CrCl:  Lab Results  Component Value Date   EGFR 76 05/04/2021    No components found for: CRCL  Evaluation of current treatment plan related to hypertension self management and patient's  adherence to plan as established by provider Reviewed medications with patient and discussed importance of compliance Counseled on the importance of exercise goals with target of 150 minutes per week Discussed complications of poorly controlled blood pressure such as heart disease, stroke, circulatory complications, vision complications, kidney impairment, sexual dysfunction Reviewed importance of adherence to low sodium diet Education mailed- low sodium diet   Patient Goals/Self-Care Activities: Take medications as prescribed   Attend all scheduled provider appointments Call pharmacy for medication refills 3-7 days in advance of running out of medications Attend church or other social activities Perform all self care activities independently  Perform IADL's (shopping, preparing meals, housekeeping, managing finances) independently Call provider office for new concerns or questions  check blood sugar at prescribed times: 4 times daily check feet daily for cuts, sores or redness enter blood sugar readings and medication or insulin into daily log take the blood sugar log to all doctor visits take the blood sugar meter to all doctor visits trim toenails straight across fill half of plate with vegetables limit fast food meals to no more than 1 per week check blood pressure weekly choose a place to take my blood pressure (home, clinic or office, retail store) write blood pressure results in a log or diary keep a blood pressure log take blood pressure log to all doctor appointments take medications for blood pressure exactly as prescribed Try to get outside daily and walk- even a few minutes helps Follow low  sodium and carbohydrate modified diet, read labels Look over education mailed- low sodium diet, hypoglycemia Advanced directives packet mailed- please review and complete If you are ever interested in smoking cessation, please let RN care manager know       Consent to CCM  Services: Ms. Roorda was given information about Chronic Care Management services including:  CCM service includes personalized support from designated clinical staff supervised by her physician, including individualized plan of care and coordination with other care providers 24/7 contact phone numbers for assistance for urgent and routine care needs. Service will only be billed when office clinical staff spend 20 minutes or more in a month to coordinate care. Only one practitioner may furnish and bill the service in a calendar month. The patient may stop CCM services at any time (effective at the end of the month) by phone call to the office staff. The patient will be responsible for cost sharing (co-pay) of up to 20% of the service fee (after annual deductible is met).  Patient agreed to services and verbal consent obtained.   The patient verbalized understanding of instructions, educational materials, and care plan provided today and agreed to receive a mailed copy of patient instructions, educational materials, and care plan.   Low-Sodium Eating Plan Sodium, which is an element that makes up salt, helps you maintain a healthy balance of fluids in your body. Too much sodium can increase your blood pressure and cause fluid and waste to be held in your body. Your health care provider or dietitian may recommend following this plan if you have high blood pressure (hypertension), kidney disease, liver disease, or heart failure. Eating less sodium can help lower your blood pressure, reduce swelling, and protect your heart, liver, and kidneys. What are tips for following this plan? Reading food labels The Nutrition Facts label lists the amount of sodium in one serving of the food. If you eat more than one serving, you must multiply the listed amount of sodium by the number of servings. Choose foods with less than 140 mg of sodium per serving. Avoid foods with 300 mg of sodium or more per  serving. Shopping  Look for lower-sodium products, often labeled as "low-sodium" or "no salt added." Always check the sodium content, even if foods are labeled as "unsalted" or "no salt added." Buy fresh foods. Avoid canned foods and pre-made or frozen meals. Avoid canned, cured, or processed meats. Buy breads that have less than 80 mg of sodium per slice. Cooking  Eat more home-cooked food and less restaurant, buffet, and fast food. Avoid adding salt when cooking. Use salt-free seasonings or herbs instead of table salt or sea salt. Check with your health care provider or pharmacist before using salt substitutes. Cook with plant-based oils, such as canola, sunflower, or olive oil. Meal planning When eating at a restaurant, ask that your food be prepared with less salt or no salt, if possible. Avoid dishes labeled as brined, pickled, cured, smoked, or made with soy sauce, miso, or teriyaki sauce. Avoid foods that contain MSG (monosodium glutamate). MSG is sometimes added to [place] food, bouillon, and some canned foods. Make meals that can be grilled, baked, poached, roasted, or steamed. These are generally made with less sodium. General information Most people on this plan should limit their sodium intake to 1,500-2,000 mg (milligrams) of sodium each day. What foods should I eat? Fruits Fresh, frozen, or canned fruit. Fruit juice. Vegetables Fresh or frozen vegetables. "No salt added" canned vegetables. "No salt  added" tomato sauce and paste. Low-sodium or reduced-sodium tomato and vegetable juice. Grains Low-sodium cereals, including oats, puffed wheat and rice, and shredded wheat. Low-sodium crackers. Unsalted rice. Unsalted pasta. Low-sodium bread. Whole-grain breads and whole-grain pasta. Meats and other proteins Fresh or frozen (no salt added) meat, poultry, seafood, and fish. Low-sodium canned tuna and salmon. Unsalted nuts. Dried peas, beans, and lentils without added salt.  Unsalted canned beans. Eggs. Unsalted nut butters. Dairy Milk. Soy milk. Cheese that is naturally low in sodium, such as ricotta cheese, fresh mozzarella, or Swiss cheese. Low-sodium or reduced-sodium cheese. Cream cheese. Yogurt. Seasonings and condiments Fresh and dried herbs and spices. Salt-free seasonings. Low-sodium mustard and ketchup. Sodium-free salad dressing. Sodium-free light mayonnaise. Fresh or refrigerated horseradish. Lemon juice. Vinegar. Other foods Homemade, reduced-sodium, or low-sodium soups. Unsalted popcorn and pretzels. Low-salt or salt-free chips. The items listed above may not be a complete list of foods and beverages you can eat. Contact a dietitian for more information. What foods should I avoid? Vegetables Sauerkraut, pickled vegetables, and relishes. Olives. [place] fries. Onion rings. Regular canned vegetables (not low-sodium or reduced-sodium). Regular canned tomato sauce and paste (not low-sodium or reduced-sodium). Regular tomato and vegetable juice (not low-sodium or reduced-sodium). Frozen vegetables in sauces. Grains Instant hot cereals. Bread stuffing, pancake, and biscuit mixes. Croutons. Seasoned rice or pasta mixes. Noodle soup cups. Boxed or frozen macaroni and cheese. Regular salted crackers. Self-rising flour. Meats and other proteins Meat or fish that is salted, canned, smoked, spiced, or pickled. Precooked or cured meat, such as sausages or meat loaves. [place] Salines. Ham. Pepperoni. Hot dogs. Corned beef. Chipped beef. Salt pork. Jerky. Pickled herring. Anchovies and sardines. Regular canned tuna. Salted nuts. Dairy Processed cheese and cheese spreads. Hard cheeses. Cheese curds. Blue cheese. Feta cheese. String cheese. Regular cottage cheese. Buttermilk. Canned milk. Fats and oils Salted butter. Regular margarine. Ghee. Bacon fat. Seasonings and condiments Onion salt, garlic salt, seasoned salt, table salt, and sea salt. Canned and packaged gravies.  Worcestershire sauce. Tartar sauce. Barbecue sauce. Teriyaki sauce. Soy sauce, including reduced-sodium. Steak sauce. Fish sauce. Oyster sauce. Cocktail sauce. Horseradish that you find on the shelf. Regular ketchup and mustard. Meat flavorings and tenderizers. Bouillon cubes. Hot sauce. Pre-made or packaged marinades. Pre-made or packaged taco seasonings. Relishes. Regular salad dressings. Salsa. Other foods Salted popcorn and pretzels. Corn chips and puffs. Potato and tortilla chips. Canned or dried soups. Pizza. Frozen entrees and pot pies. The items listed above may not be a complete list of foods and beverages you should avoid. Contact a dietitian for more information. Summary Eating less sodium can help lower your blood pressure, reduce swelling, and protect your heart, liver, and kidneys. Most people on this plan should limit their sodium intake to 1,500-2,000 mg (milligrams) of sodium each day. Canned, boxed, and frozen foods are high in sodium. Restaurant foods, fast foods, and pizza are also very high in sodium. You also get sodium by adding salt to food. Try to cook at home, eat more fresh fruits and vegetables, and eat less fast food and canned, processed, or prepared foods. This information is not intended to replace advice given to you by your health care provider. Make sure you discuss any questions you have with your health care provider. Document Revised: 08/29/2019 Document Reviewed: 06/25/2019 Elsevier Patient Education  [place]. Hypoglycemia Hypoglycemia occurs when the level of sugar (glucose) in the blood is too low. Hypoglycemia can happen in people who have or do not have diabetes.  It can develop quickly, and it can be a medical emergency. For most people, a blood glucose level below 70 mg/dL (3.9 mmol/L) is considered hypoglycemia. Glucose is a type of sugar that provides the body's main source of energy. Certain hormones (insulin and glucagon) control the level of  glucose in the blood. Insulin lowers blood glucose, and glucagon raises blood glucose. Hypoglycemia can result from having too much insulin in the bloodstream, or from not eating enough food that contains glucose. You may also have reactive hypoglycemia, which happens within 4 hours after eating a meal. What are the causes? Hypoglycemia occurs most often in people who have diabetes and may be caused by: Diabetes medicine. Not eating enough, or not eating often enough. Increased physical activity. Drinking alcohol on an empty stomach. If you do not have diabetes, hypoglycemia may be caused by: A tumor in the pancreas. Not eating enough, or not eating for long periods at a time (fasting). A severe infection or illness. Problems after having bariatric surgery. Organ failure, such as kidney or liver failure. Certain medicines. What increases the risk? Hypoglycemia is more likely to develop in people who: Have diabetes and take medicines to lower blood glucose. Abuse alcohol. Have a severe illness. What are the signs or symptoms? Symptoms vary depending on whether the condition is mild, moderate, or severe. Mild hypoglycemia Hunger. Sweating and feeling clammy. Dizziness or feeling light-headed. Sleepiness or restless sleep. Nausea. Increased heart rate. Headache. Blurry vision. Mood changes, such as irritability or anxiety. Tingling or numbness around the mouth, lips, or tongue. Moderate hypoglycemia Confusion and poor judgment. Behavior changes. Weakness. Irregular heartbeat. A change in coordination. Severe hypoglycemia Severe hypoglycemia is a medical emergency. It can cause: Fainting. Seizures. Loss of consciousness (coma). Death. How is this diagnosed? Hypoglycemia is diagnosed with a blood test to measure your blood glucose level. This blood test is done while you are having symptoms. Your health care provider may also do a physical exam and review your medical  history. How is this treated? This condition can be treated by immediately eating or drinking something that contains sugar with 15 grams of fast-acting carbohydrate, such as: 4 oz (120 mL) of fruit juice. 4 oz (120 mL) of regular soda (not diet soda). Several pieces of hard candy. Check food labels to find out how many pieces to eat for 15 grams. 1 Tbsp (15 mL) of sugar or honey. 4 glucose tablets. 1 tube of glucose gel. Treating hypoglycemia if you have diabetes If you are alert and able to swallow safely, follow the 15:15 rule: Take 15 grams of a fast-acting carbohydrate. Talk with your health care provider about how much you should take. Options for getting 15 grams of fast-acting carbohydrate include: Glucose tablets (take 4 tablets). Several pieces of hard candy. Check food labels to find out how many pieces to eat for 15 grams. 4 oz (120 mL) of fruit juice. 4 oz (120 mL) of regular soda (not diet soda). 1 Tbsp (15 mL) of sugar or honey. 1 tube of glucose gel. Check your blood glucose 15 minutes after you take the carbohydrate. If the repeat blood glucose level is still at or below 70 mg/dL (3.9 mmol/L), take 15 grams of a carbohydrate again. If your blood glucose level does not increase above 70 mg/dL (3.9 mmol/L) after 3 tries, seek emergency medical care. After your blood glucose level returns to normal, eat a meal or a snack within 1 hour.  Treating severe hypoglycemia Severe hypoglycemia is  when your blood glucose level is below 54 mg/dL (3 mmol/L). Severe hypoglycemia is a medical emergency. Get medical help right away. If you have severe hypoglycemia and you cannot eat or drink, you will need to be given glucagon. A family member or close friend should learn how to check your blood glucose and how to give you glucagon. Ask your health care provider if you need to have an emergency glucagon kit available. Severe hypoglycemia may need to be treated in a hospital. The treatment  may include getting glucose through an IV. You may also need treatment for the cause of your hypoglycemia. Follow these instructions at home:  General instructions Take over-the-counter and prescription medicines only as told by your health care provider. Monitor your blood glucose as told by your health care provider. If you drink alcohol: Limit how much you have to: 0-1 drink a day for women who are not pregnant. 0-2 drinks a day for men. Know how much alcohol is in your drink. In the [place] one drink equals one 12 oz bottle of beer (355 mL), one 5 oz glass of wine (148 mL), or one 1 oz glass of hard liquor (44 mL). Be sure to eat food along with drinking alcohol. Be aware that alcohol is absorbed quickly and may have lingering effects that may result in hypoglycemia later. Be sure to do ongoing glucose monitoring. Keep all follow-up visits. This is important. If you have diabetes: Always have a fast-acting carbohydrate (15 grams) option with you to treat low blood glucose. Follow your diabetes management plan as directed by your health care provider. Make sure you: Know the symptoms of hypoglycemia. It is important to treat it right away to prevent it from becoming severe. Check your blood glucose as often as told. Always check before and after exercise. Always check your blood glucose before you drive a motorized vehicle. Take your medicines as told. Follow your meal plan. Eat on time, and do not skip meals. Share your diabetes management plan with people in your workplace, school, and household. Carry a medical alert card or wear medical alert jewelry. Where to find more information American Diabetes Association: www.diabetes.org Contact a health care provider if: You have problems keeping your blood glucose in your target range. You have frequent episodes of hypoglycemia. Get help right away if: You continue to have hypoglycemia symptoms after eating or drinking something that  contains 15 grams of fast-acting carbohydrate, and you cannot get your blood glucose above 70 mg/dL (3.9 mmol/L) while following the 15:15 rule. Your blood glucose is below 54 mg/dL (3 mmol/L). You have a seizure. You faint. These symptoms may represent a serious problem that is an emergency. Do not wait to see if the symptoms will go away. Get medical help right away. Call your local emergency services (911 in the [place] Do not drive yourself to the hospital. Summary Hypoglycemia occurs when the level of sugar (glucose) in the blood is too low. Hypoglycemia can happen in people who have or do not have diabetes. It can develop quickly, and it can be a medical emergency. Make sure you know the symptoms of hypoglycemia and how to treat it. Always have a fast-acting carbohydrate option with you to treat low blood sugar. This information is not intended to replace advice given to you by your health care provider. Make sure you discuss any questions you have with your health care provider. Document Revised: 06/24/2020 Document Reviewed: 06/24/2020 Elsevier Patient Education  [place].  Telephone follow up appointment with care management team member scheduled for:

## 2021-12-23 NOTE — Chronic Care Management (AMB) (Signed)
Chronic Care Management   CCM RN Visit Note  12/23/2021 Name: Stephanie Hammond MRN: [id number] DOB: [birth year]  Subjective: Stephanie Hammond is a 71 y.o. year old female who is a primary care patient of Anabel Halon, MD. The care management team was consulted for assistance with disease management and care coordination needs.    Engaged with patient by telephone for initial visit in response to provider referral for case management and/or care coordination services.   Consent to Services:  The patient was given the following information about Chronic Care Management services today, agreed to services, and gave verbal consent: 1. CCM service includes personalized support from designated clinical staff supervised by the primary care provider, including individualized plan of care and coordination with other care providers 2. 24/7 contact phone numbers for assistance for urgent and routine care needs. 3. Service will only be billed when office clinical staff spend 20 minutes or more in a month to coordinate care. 4. Only one practitioner may furnish and bill the service in a calendar month. 5.The patient may stop CCM services at any time (effective at the end of the month) by phone call to the office staff. 6. The patient will be responsible for cost sharing (co-pay) of up to 20% of the service fee (after annual deductible is met). Patient agreed to services and consent obtained.  Patient agreed to services and verbal consent obtained.   Assessment: Review of patient past medical history, allergies, medications, health status, including review of consultants reports, laboratory and other test data, was performed as part of comprehensive evaluation and provision of chronic care management services.   SDOH (Social Determinants of Health) assessments and interventions performed:  SDOH Interventions    Flowsheet Row Most Recent Value  SDOH Interventions   Food Insecurity Interventions  Intervention Not Indicated  Transportation Interventions Intervention Not Indicated        CCM Care Plan  Allergies  Allergen Reactions   Ace Inhibitors Cough   Bentyl [Dicyclomine Hcl] Other (See Comments)    Blurry vision   Penicillins Itching and Swelling    Whelps Did it involve swelling of the face/tongue/throat, SOB, or low BP? No Did it involve sudden or severe rash/hives, skin peeling, or any reaction on the inside of your mouth or nose? No Did you need to seek medical attention at a hospital or doctor's office? No When did it last happen?      20 years If all above answers are "NO", may proceed with cephalosporin use.     Outpatient Encounter Medications as of 12/23/2021  Medication Sig Note   allopurinol (ZYLOPRIM) 300 MG tablet TAKE ONE TABLET BY MOUTH EVERY DAY    amLODipine (NORVASC) 10 MG tablet TAKE ONE TABLET BY MOUTH ONCE DAILY    atorvastatin (LIPITOR) 20 MG tablet TAKE ONE TABLET BY MOUTH ONCE DAILY    Blood Glucose Monitoring Suppl (BLOOD GLUCOSE SYSTEM PAK) KIT Please dispense based on patient and insurance preference. Use as directed to monitor FSBS 3x daily. Dx: E11.9    BUTRANS 15 MCG/HR Place 1 patch onto the skin once a week.    DULoxetine (CYMBALTA) 60 MG capsule Take 60 mg by mouth daily.    Glucose Blood (BLOOD GLUCOSE TEST STRIPS) STRP Please dispense based on patient and insurance preference. Use as directed to monitor FSBS 3x daily. Dx: E11.9  For testing 4 times daily    Insulin Glargine (BASAGLAR KWIKPEN) 100 UNIT/ML Inject 25 Units into the skin at  bedtime.    Insulin Pen Needle (B-D UF III MINI PEN NEEDLES) 31G X 5 MM MISC FOR USE with INSULIN PENS    Lancets MISC Please dispense based on patient and insurance preference. Use as directed to monitor FSBS 3x daily. Dx: E11.9  For testing 4 times daily    losartan (COZAAR) 100 MG tablet TAKE ONE TABLET BY MOUTH ONCE DAILY    metFORMIN (GLUCOPHAGE-XR) 500 MG 24 hr tablet TAKE 2 TABLETS(1000 MG)  BY MOUTH DAILY WITH BREAKFAST    NOVOLOG FLEXPEN 100 UNIT/ML FlexPen Sliding Scale: Blood Sugar of 150-200 = TWO units, 201-250 = 4units, 251-300 = 6units, 301-350 = 8units, 351-400 = 10units 08/24/2021: 5 units as needed for blood glucose >300   omeprazole (PRILOSEC) 20 MG capsule TAKE 1 CAPSULE BY MOUTH EVERY DAY IN THE MORNING    oxyCODONE-acetaminophen (PERCOCET) 10-325 MG tablet Take 1 tablet by mouth 2 (two) times daily.    Semaglutide, 1 MG/DOSE, 4 MG/3ML SOPN Inject 1 mg as directed once a week.    topiramate (TOPAMAX) 25 MG tablet Take 50 mg by mouth daily.    Continuous Blood Gluc Sensor (DEXCOM G6 SENSOR) MISC Apply new sensor every 10 days (Patient not taking: Reported on 12/23/2021)    Continuous Blood Gluc Transmit (DEXCOM G6 TRANSMITTER) MISC Use 1 transmitter every 90 days (Patient not taking: Reported on 12/23/2021)    NURTEC 75 MG TBDP Take 1 tablet by mouth daily as needed. (Patient not taking: Reported on 09/21/2021)    No facility-administered encounter medications on file as of 12/23/2021.    Patient Active Problem List   Diagnosis Date Noted   Influenza vaccine refused 09/16/2021   Encounter for general adult medical examination with abnormal findings 03/22/2021   Hardening of the aorta (main artery of the heart) (HCC) 10/25/2020   Chronic pain syndrome 10/25/2020   Insomnia 08/18/2020   Colovaginal fistula 05/13/2019   History of diverticulitis 03/19/2019   Failed total knee, left, initial encounter (HCC) 01/09/2019   Anemia 12/31/2015   Arthritis of knee, degenerative 03/12/2015   Arthritis of left knee    Gout 09/07/2014   Back pain with left-sided radiculopathy 09/07/2014   Type 2 diabetes mellitus with diabetic neuropathy (HCC) 02/14/2014   Hyperlipidemia 03/12/2013   IBS (irritable bowel syndrome) 01/09/2013   Diabetic neuropathy (HCC) 11/14/2012   Major depression, single episode 09/13/2012   Essential hypertension, benign 06/09/2012   Sinus tachycardia  03/28/2012   DDD (degenerative disc disease), lumbar 03/28/2012   Chronic knee pain 03/28/2012   Obesity (BMI 30-39.9) 03/28/2012   Tobacco abuse 03/28/2012   CAD (coronary artery disease) 03/28/2012    Conditions to be addressed/monitored:HTN and DMII  Care Plan : RN Care Manager Plan of Care  Updates made by Audrie Gallus, RN since 12/23/2021 12:00 AM     Problem: No plan of care established for management of chronic disease state  (DM2, HTN)   Priority: High     Long-Range Goal: Development of plan of care for chronic disease management  (DM2, HTN)   Start Date: 12/23/2021  Expected End Date: 06/21/2022  Priority: High  Note:   Current Barriers:  Knowledge Deficits related to plan of care for management of HTN and DMII  Chronic Disease Management support and education needs related to HTN and DMII No Advanced Directives in place- pt requests information be mailed Patient reports she lives alone, is independent in all aspects of her care, has a son and other family she  can call on if needed. Patient states she checks CBG 4 x day and is not using Dexcom anymore due to incorrect readings, pt has been working with CCM pharmacist on this issue and machine recalibrated but still does not work correctly and pt feels it is too much trouble and feels the fingerstick readings are more reliable, pt reports fasting ranges low 100's with today's reading 128, random ranges 190-210, pt has a blood pressure cuff but does not monitor blood pressure, pt states she does not follow a special diet and does not exercise, continues to smoke 1 ppd and not interested in smoking cessation at present.  RNCM Clinical Goal(s):  Patient will verbalize understanding of plan for management of HTN and DMII as evidenced by patient report, review of EHR and  through collaboration with RN Care manager, provider, and care team.   Interventions: 1:1 collaboration with primary care provider regarding development and  update of comprehensive plan of care as evidenced by provider attestation and co-signature Inter-disciplinary care team collaboration (see longitudinal plan of care) Evaluation of current treatment plan related to  self management and patient's adherence to plan as established by provider   Diabetes Interventions:  (Status:  New goal. and Goal on track:  Yes.) Long Term Goal Assessed patient's understanding of A1c goal: <7% Provided education to patient about basic DM disease process Reviewed medications with patient and discussed importance of medication adherence Counseled on importance of regular laboratory monitoring as prescribed Discussed plans with patient for ongoing care management follow up and provided patient with direct contact information for care management team Provided patient with written educational materials related to hypo and hyperglycemia and importance of correct treatment Review of patient status, including review of consultants reports, relevant laboratory and other test results, and medications completed Screening for signs and symptoms of depression related to chronic disease state  Assessed social determinant of health barriers Reviewed carbohydrate modified diet Education mailed- hypoglycemia Offered smoking cessation resources Lab Results  Component Value Date   HGBA1C 5.7 08/26/2021   Hypertension Interventions:  (Status:  New goal. and Goal on track:  Yes.) Long Term Goal Last practice recorded BP readings:  BP Readings from Last 3 Encounters:  11/09/21 118/60  08/26/21 115/73  03/22/21 125/74  Most recent eGFR/CrCl:  Lab Results  Component Value Date   EGFR 76 05/04/2021    No components found for: CRCL  Evaluation of current treatment plan related to hypertension self management and patient's adherence to plan as established by provider Reviewed medications with patient and discussed importance of compliance Counseled on the importance of  exercise goals with target of 150 minutes per week Discussed complications of poorly controlled blood pressure such as heart disease, stroke, circulatory complications, vision complications, kidney impairment, sexual dysfunction Reviewed importance of adherence to low sodium diet Education mailed- low sodium diet   Patient Goals/Self-Care Activities: Take medications as prescribed   Attend all scheduled provider appointments Call pharmacy for medication refills 3-7 days in advance of running out of medications Attend church or other social activities Perform all self care activities independently  Perform IADL's (shopping, preparing meals, housekeeping, managing finances) independently Call provider office for new concerns or questions  check blood sugar at prescribed times: 4 times daily check feet daily for cuts, sores or redness enter blood sugar readings and medication or insulin into daily log take the blood sugar log to all doctor visits take the blood sugar meter to all doctor visits trim toenails straight across  fill half of plate with vegetables limit fast food meals to no more than 1 per week check blood pressure weekly choose a place to take my blood pressure (home, clinic or office, retail store) write blood pressure results in a log or diary keep a blood pressure log take blood pressure log to all doctor appointments take medications for blood pressure exactly as prescribed Try to get outside daily and walk- even a few minutes helps Follow low sodium and carbohydrate modified diet, read labels Look over education mailed- low sodium diet, hypoglycemia Advanced directives packet mailed- please review and complete If you are ever interested in smoking cessation, please let RN care manager know       Plan:Telephone follow up appointment with care management team member scheduled for:  03/07/22  Irving Shows [facility], BSN RN Case Manager [facility] [phone]

## 2021-12-23 NOTE — Patient Instructions (Addendum)
Please start taking Ozempic 2 mg instead of 1 mg once your current supply ends.  Please take Basaglar 10 Units at nighttime instead of 20 Units once you start Ozempic 2 mg dose.  Please continue taking other medications as prescribed.  Please continue to follow low carb diet and perform moderate exercise/walking at least 150 mins/week.

## 2021-12-23 NOTE — Assessment & Plan Note (Signed)
Diet modification and ambulation as tolerated On GLP-1 agonist for type II DM

## 2021-12-23 NOTE — Progress Notes (Signed)
Established Patient Office Visit  Subjective:  Patient ID: Stephanie Hammond, female    DOB: 1951-04-06  Age: 71 y.o. MRN: [id number]  CC:  Chief Complaint  Patient presents with   Follow-up    Pt would like to see if she can get off the metformin, would like to come off one of the bp medication.     HPI Stephanie Hammond is a 71 y.o. female with past medical history of HTN, HLD, type 2 DM, diverticulitis, s/p partial colectomy, chronic pain syndrome, gout, obesity and tobacco abuse who presents for f/u of her chronic medical conditions.  HTN: BP is well-controlled. Takes medications regularly. Patient denies headache, dizziness, chest pain, dyspnea or palpitations.  She asked if she could come off of 1 medication.  She was advised that her BP is well controlled currently and would be elevated if amlodipine was discontinued.  Type II DM: Her HbA1c was 5.0 today. She has tolerated Ozempic well. She takes Basaglar 20-25 units according to blood glucose at nighttime.  She agrees to take Basaglar 10 units at nighttime for now as her HbA1C is only 5.0 and it seems like she might be having episodes of hypoglycemia. She does admit having headache and dizziness at times.  She also takes NovoLog 5 units as needed for blood glucose more than 250.  She also takes metformin and wants to discontinue it later. She has not brought her glucometer for review today.  She takes Lipitor for HLD.   Past Medical History:  Diagnosis Date   Anemia    Blood transfusion 2009   after GI bleed   CAD (coronary artery disease)    Diabetes mellitus    2   Diverticulitis    Diverticulosis    Fear of local anesthetic    01-02-2019, patient states with last surgery in 2016 , she refused to have epidural before she was sedated d/t to extreme aversion to needles near her back     GERD (gastroesophageal reflux disease)    Gout    Headache    occ if sugar is  too hi or low    Heart murmur    "i was told this years  and years and years ago "   History of GI diverticular bleed 08/2004   Hypertension    IBS (irritable bowel syndrome)    Osteoarthritis    Tachycardia    "it stays like that for years between 119 and 121 and it doesnt bother me  none "     Past Surgical History:  Procedure Laterality Date   CHOLECYSTECTOMY  2003   COLON SURGERY     COLONOSCOPY  2000   Dr. Fuller Plan: marked diverticulosis, difficult procedure due to adhesions, polyps benign   COLONOSCOPY  2006   Dr. Henrene Pastor: severe pandiverticulosis   COLONOSCOPY N/A 02/03/2013   SLF: 4 COLORECTAL POLYPS REMOVED/Moderate diverticulosis throughout the entire examined colon/Small internal hemorrhoids   COLONOSCOPY WITH PROPOFOL N/A 04/29/2019   Procedure: COLONOSCOPY WITH PROPOFOL;  Surgeon: Danie Binder, MD;  Location: [facility];  Service: Endoscopy;  Laterality: N/A;  8:30am   JOINT REPLACEMENT Right 2009   Knee Replacement Right    POLYPECTOMY  04/29/2019   Procedure: POLYPECTOMY;  Surgeon: Danie Binder, MD;  Location: [facility];  Service: Endoscopy;;  colon   PTCA     over 10 years ago , unsure of exact year , does recall that no stents were placed  Established Patient Office Visit  Subjective:  Patient ID: Stephanie Hammond, female    DOB: 06/13/1951  Age: 71 y.o. MRN: [id number]  CC:  Chief Complaint  Patient presents with   Follow-up    Pt would like to see if she can get off the metformin, would like to come off one of the bp medication.     HPI Stephanie Hammond is a 71 y.o. female with past medical history of HTN, HLD, type 2 DM, diverticulitis, s/p partial colectomy, chronic pain syndrome, gout, obesity and tobacco abuse who presents for f/u of her chronic medical conditions.  HTN: BP is well-controlled. Takes medications regularly. Patient denies headache, dizziness, chest pain, dyspnea or palpitations.  She asked if she could come off of 1 medication.  She was advised that her BP is well controlled currently and would be elevated if amlodipine was discontinued.  Type II DM: Her HbA1c was 5.0 today. She has tolerated Ozempic well. She takes Basaglar 20-25 units according to blood glucose at nighttime.  She agrees to take Basaglar 10 units at nighttime for now as her HbA1C is only 5.0 and it seems like she might be having episodes of hypoglycemia. She does admit having headache and dizziness at times.  She also takes NovoLog 5 units as needed for blood glucose more than 250.  She also takes metformin and wants to discontinue it later. She has not brought her glucometer for review today.  She takes Lipitor for HLD.   Past Medical History:  Diagnosis Date   Anemia    Blood transfusion 2009   after GI bleed   CAD (coronary artery disease)    Diabetes mellitus    2   Diverticulitis    Diverticulosis    Fear of local anesthetic    01-02-2019, patient states with last surgery in 2016 , she refused to have epidural before she was sedated d/t to extreme aversion to needles near her back     GERD (gastroesophageal reflux disease)    Gout    Headache    occ if sugar is  too hi or low    Heart murmur    "i was told this years  and years and years ago "   History of GI diverticular bleed 08/2004   Hypertension    IBS (irritable bowel syndrome)    Osteoarthritis    Tachycardia    "it stays like that for years between 119 and 121 and it doesnt bother me  none "     Past Surgical History:  Procedure Laterality Date   CHOLECYSTECTOMY  2003   COLON SURGERY     COLONOSCOPY  2000   Dr. Fuller Plan: marked diverticulosis, difficult procedure due to adhesions, polyps benign   COLONOSCOPY  2006   Dr. Henrene Pastor: severe pandiverticulosis   COLONOSCOPY N/A 02/03/2013   SLF: 4 COLORECTAL POLYPS REMOVED/Moderate diverticulosis throughout the entire examined colon/Small internal hemorrhoids   COLONOSCOPY WITH PROPOFOL N/A 04/29/2019   Procedure: COLONOSCOPY WITH PROPOFOL;  Surgeon: Danie Binder, MD;  Location: [facility];  Service: Endoscopy;  Laterality: N/A;  8:30am   JOINT REPLACEMENT Right 2009   Knee Replacement Right    POLYPECTOMY  04/29/2019   Procedure: POLYPECTOMY;  Surgeon: Danie Binder, MD;  Location: [facility];  Service: Endoscopy;;  colon   PTCA     over 10 years ago , unsure of exact year , does recall that no stents were placed  Established Patient Office Visit  Subjective:  Patient ID: Stephanie Hammond, female    DOB: 06/13/1951  Age: 71 y.o. MRN: [id number]  CC:  Chief Complaint  Patient presents with   Follow-up    Pt would like to see if she can get off the metformin, would like to come off one of the bp medication.     HPI Stephanie Hammond is a 71 y.o. female with past medical history of HTN, HLD, type 2 DM, diverticulitis, s/p partial colectomy, chronic pain syndrome, gout, obesity and tobacco abuse who presents for f/u of her chronic medical conditions.  HTN: BP is well-controlled. Takes medications regularly. Patient denies headache, dizziness, chest pain, dyspnea or palpitations.  She asked if she could come off of 1 medication.  She was advised that her BP is well controlled currently and would be elevated if amlodipine was discontinued.  Type II DM: Her HbA1c was 5.0 today. She has tolerated Ozempic well. She takes Basaglar 20-25 units according to blood glucose at nighttime.  She agrees to take Basaglar 10 units at nighttime for now as her HbA1C is only 5.0 and it seems like she might be having episodes of hypoglycemia. She does admit having headache and dizziness at times.  She also takes NovoLog 5 units as needed for blood glucose more than 250.  She also takes metformin and wants to discontinue it later. She has not brought her glucometer for review today.  She takes Lipitor for HLD.   Past Medical History:  Diagnosis Date   Anemia    Blood transfusion 2009   after GI bleed   CAD (coronary artery disease)    Diabetes mellitus    2   Diverticulitis    Diverticulosis    Fear of local anesthetic    01-02-2019, patient states with last surgery in 2016 , she refused to have epidural before she was sedated d/t to extreme aversion to needles near her back     GERD (gastroesophageal reflux disease)    Gout    Headache    occ if sugar is  too hi or low    Heart murmur    "i was told this years  and years and years ago "   History of GI diverticular bleed 08/2004   Hypertension    IBS (irritable bowel syndrome)    Osteoarthritis    Tachycardia    "it stays like that for years between 119 and 121 and it doesnt bother me  none "     Past Surgical History:  Procedure Laterality Date   CHOLECYSTECTOMY  2003   COLON SURGERY     COLONOSCOPY  2000   Dr. Fuller Plan: marked diverticulosis, difficult procedure due to adhesions, polyps benign   COLONOSCOPY  2006   Dr. Henrene Pastor: severe pandiverticulosis   COLONOSCOPY N/A 02/03/2013   SLF: 4 COLORECTAL POLYPS REMOVED/Moderate diverticulosis throughout the entire examined colon/Small internal hemorrhoids   COLONOSCOPY WITH PROPOFOL N/A 04/29/2019   Procedure: COLONOSCOPY WITH PROPOFOL;  Surgeon: Danie Binder, MD;  Location: [facility];  Service: Endoscopy;  Laterality: N/A;  8:30am   JOINT REPLACEMENT Right 2009   Knee Replacement Right    POLYPECTOMY  04/29/2019   Procedure: POLYPECTOMY;  Surgeon: Danie Binder, MD;  Location: [facility];  Service: Endoscopy;;  colon   PTCA     over 10 years ago , unsure of exact year , does recall that no stents were placed  Established Patient Office Visit  Subjective:  Patient ID: Stephanie Hammond, female    DOB: 1951-04-06  Age: 71 y.o. MRN: [id number]  CC:  Chief Complaint  Patient presents with   Follow-up    Pt would like to see if she can get off the metformin, would like to come off one of the bp medication.     HPI Stephanie Hammond is a 71 y.o. female with past medical history of HTN, HLD, type 2 DM, diverticulitis, s/p partial colectomy, chronic pain syndrome, gout, obesity and tobacco abuse who presents for f/u of her chronic medical conditions.  HTN: BP is well-controlled. Takes medications regularly. Patient denies headache, dizziness, chest pain, dyspnea or palpitations.  She asked if she could come off of 1 medication.  She was advised that her BP is well controlled currently and would be elevated if amlodipine was discontinued.  Type II DM: Her HbA1c was 5.0 today. She has tolerated Ozempic well. She takes Basaglar 20-25 units according to blood glucose at nighttime.  She agrees to take Basaglar 10 units at nighttime for now as her HbA1C is only 5.0 and it seems like she might be having episodes of hypoglycemia. She does admit having headache and dizziness at times.  She also takes NovoLog 5 units as needed for blood glucose more than 250.  She also takes metformin and wants to discontinue it later. She has not brought her glucometer for review today.  She takes Lipitor for HLD.   Past Medical History:  Diagnosis Date   Anemia    Blood transfusion 2009   after GI bleed   CAD (coronary artery disease)    Diabetes mellitus    2   Diverticulitis    Diverticulosis    Fear of local anesthetic    01-02-2019, patient states with last surgery in 2016 , she refused to have epidural before she was sedated d/t to extreme aversion to needles near her back     GERD (gastroesophageal reflux disease)    Gout    Headache    occ if sugar is  too hi or low    Heart murmur    "i was told this years  and years and years ago "   History of GI diverticular bleed 08/2004   Hypertension    IBS (irritable bowel syndrome)    Osteoarthritis    Tachycardia    "it stays like that for years between 119 and 121 and it doesnt bother me  none "     Past Surgical History:  Procedure Laterality Date   CHOLECYSTECTOMY  2003   COLON SURGERY     COLONOSCOPY  2000   Dr. Fuller Plan: marked diverticulosis, difficult procedure due to adhesions, polyps benign   COLONOSCOPY  2006   Dr. Henrene Pastor: severe pandiverticulosis   COLONOSCOPY N/A 02/03/2013   SLF: 4 COLORECTAL POLYPS REMOVED/Moderate diverticulosis throughout the entire examined colon/Small internal hemorrhoids   COLONOSCOPY WITH PROPOFOL N/A 04/29/2019   Procedure: COLONOSCOPY WITH PROPOFOL;  Surgeon: Danie Binder, MD;  Location: [facility];  Service: Endoscopy;  Laterality: N/A;  8:30am   JOINT REPLACEMENT Right 2009   Knee Replacement Right    POLYPECTOMY  04/29/2019   Procedure: POLYPECTOMY;  Surgeon: Danie Binder, MD;  Location: [facility];  Service: Endoscopy;;  colon   PTCA     over 10 years ago , unsure of exact year , does recall that no stents were placed  Established Patient Office Visit  Subjective:  Patient ID: Stephanie Hammond, female    DOB: 1951-04-06  Age: 71 y.o. MRN: [id number]  CC:  Chief Complaint  Patient presents with   Follow-up    Pt would like to see if she can get off the metformin, would like to come off one of the bp medication.     HPI Stephanie Hammond is a 71 y.o. female with past medical history of HTN, HLD, type 2 DM, diverticulitis, s/p partial colectomy, chronic pain syndrome, gout, obesity and tobacco abuse who presents for f/u of her chronic medical conditions.  HTN: BP is well-controlled. Takes medications regularly. Patient denies headache, dizziness, chest pain, dyspnea or palpitations.  She asked if she could come off of 1 medication.  She was advised that her BP is well controlled currently and would be elevated if amlodipine was discontinued.  Type II DM: Her HbA1c was 5.0 today. She has tolerated Ozempic well. She takes Basaglar 20-25 units according to blood glucose at nighttime.  She agrees to take Basaglar 10 units at nighttime for now as her HbA1C is only 5.0 and it seems like she might be having episodes of hypoglycemia. She does admit having headache and dizziness at times.  She also takes NovoLog 5 units as needed for blood glucose more than 250.  She also takes metformin and wants to discontinue it later. She has not brought her glucometer for review today.  She takes Lipitor for HLD.   Past Medical History:  Diagnosis Date   Anemia    Blood transfusion 2009   after GI bleed   CAD (coronary artery disease)    Diabetes mellitus    2   Diverticulitis    Diverticulosis    Fear of local anesthetic    01-02-2019, patient states with last surgery in 2016 , she refused to have epidural before she was sedated d/t to extreme aversion to needles near her back     GERD (gastroesophageal reflux disease)    Gout    Headache    occ if sugar is  too hi or low    Heart murmur    "i was told this years  and years and years ago "   History of GI diverticular bleed 08/2004   Hypertension    IBS (irritable bowel syndrome)    Osteoarthritis    Tachycardia    "it stays like that for years between 119 and 121 and it doesnt bother me  none "     Past Surgical History:  Procedure Laterality Date   CHOLECYSTECTOMY  2003   COLON SURGERY     COLONOSCOPY  2000   Dr. Fuller Plan: marked diverticulosis, difficult procedure due to adhesions, polyps benign   COLONOSCOPY  2006   Dr. Henrene Pastor: severe pandiverticulosis   COLONOSCOPY N/A 02/03/2013   SLF: 4 COLORECTAL POLYPS REMOVED/Moderate diverticulosis throughout the entire examined colon/Small internal hemorrhoids   COLONOSCOPY WITH PROPOFOL N/A 04/29/2019   Procedure: COLONOSCOPY WITH PROPOFOL;  Surgeon: Danie Binder, MD;  Location: [facility];  Service: Endoscopy;  Laterality: N/A;  8:30am   JOINT REPLACEMENT Right 2009   Knee Replacement Right    POLYPECTOMY  04/29/2019   Procedure: POLYPECTOMY;  Surgeon: Danie Binder, MD;  Location: [facility];  Service: Endoscopy;;  colon   PTCA     over 10 years ago , unsure of exact year , does recall that no stents were placed

## 2021-12-23 NOTE — Assessment & Plan Note (Signed)
BP Readings from Last 1 Encounters:  12/23/21 118/68   Well-controlled with Amlodipine and Losartan Counseled for compliance with the medications Advised DASH diet and moderate exercise/walking, at least 150 mins/week

## 2021-12-26 ENCOUNTER — Other Ambulatory Visit: Payer: Self-pay | Admitting: Internal Medicine

## 2022-01-01 DIAGNOSIS — E1140 Type 2 diabetes mellitus with diabetic neuropathy, unspecified: Secondary | ICD-10-CM | POA: Diagnosis not present

## 2022-01-04 DIAGNOSIS — Z794 Long term (current) use of insulin: Secondary | ICD-10-CM

## 2022-01-04 DIAGNOSIS — I10 Essential (primary) hypertension: Secondary | ICD-10-CM

## 2022-01-04 DIAGNOSIS — E1169 Type 2 diabetes mellitus with other specified complication: Secondary | ICD-10-CM

## 2022-01-04 DIAGNOSIS — Z7984 Long term (current) use of oral hypoglycemic drugs: Secondary | ICD-10-CM

## 2022-01-04 DIAGNOSIS — R69 Illness, unspecified: Secondary | ICD-10-CM | POA: Diagnosis not present

## 2022-01-04 DIAGNOSIS — F17210 Nicotine dependence, cigarettes, uncomplicated: Secondary | ICD-10-CM

## 2022-01-26 ENCOUNTER — Other Ambulatory Visit: Payer: Self-pay | Admitting: Internal Medicine

## 2022-01-26 DIAGNOSIS — I10 Essential (primary) hypertension: Secondary | ICD-10-CM

## 2022-01-27 DIAGNOSIS — Z79891 Long term (current) use of opiate analgesic: Secondary | ICD-10-CM | POA: Diagnosis not present

## 2022-01-27 DIAGNOSIS — M542 Cervicalgia: Secondary | ICD-10-CM | POA: Diagnosis not present

## 2022-01-27 DIAGNOSIS — M25561 Pain in right knee: Secondary | ICD-10-CM | POA: Diagnosis not present

## 2022-01-27 DIAGNOSIS — M5451 Vertebrogenic low back pain: Secondary | ICD-10-CM | POA: Diagnosis not present

## 2022-01-27 DIAGNOSIS — G4733 Obstructive sleep apnea (adult) (pediatric): Secondary | ICD-10-CM | POA: Diagnosis not present

## 2022-01-27 DIAGNOSIS — M25562 Pain in left knee: Secondary | ICD-10-CM | POA: Diagnosis not present

## 2022-01-27 DIAGNOSIS — M545 Low back pain, unspecified: Secondary | ICD-10-CM | POA: Diagnosis not present

## 2022-01-31 ENCOUNTER — Encounter: Payer: Self-pay | Admitting: Internal Medicine

## 2022-02-02 ENCOUNTER — Other Ambulatory Visit: Payer: Self-pay | Admitting: *Deleted

## 2022-02-02 DIAGNOSIS — E1140 Type 2 diabetes mellitus with diabetic neuropathy, unspecified: Secondary | ICD-10-CM

## 2022-02-13 ENCOUNTER — Other Ambulatory Visit: Payer: Self-pay | Admitting: *Deleted

## 2022-02-13 MED ORDER — ALLOPURINOL 300 MG PO TABS: 300.0000 mg | ORAL_TABLET | Freq: Every day | ORAL | 0 refills | Status: DC

## 2022-02-16 ENCOUNTER — Other Ambulatory Visit: Payer: Self-pay | Admitting: Internal Medicine

## 2022-02-23 ENCOUNTER — Other Ambulatory Visit: Payer: Self-pay | Admitting: Internal Medicine

## 2022-02-23 DIAGNOSIS — E1140 Type 2 diabetes mellitus with diabetic neuropathy, unspecified: Secondary | ICD-10-CM

## 2022-02-23 DIAGNOSIS — E785 Hyperlipidemia, unspecified: Secondary | ICD-10-CM

## 2022-02-25 ENCOUNTER — Other Ambulatory Visit: Payer: Self-pay | Admitting: Internal Medicine

## 2022-02-25 DIAGNOSIS — I10 Essential (primary) hypertension: Secondary | ICD-10-CM

## 2022-03-03 ENCOUNTER — Ambulatory Visit (INDEPENDENT_AMBULATORY_CARE_PROVIDER_SITE_OTHER): Payer: Medicare HMO

## 2022-03-03 DIAGNOSIS — E1140 Type 2 diabetes mellitus with diabetic neuropathy, unspecified: Secondary | ICD-10-CM

## 2022-03-03 DIAGNOSIS — Z794 Long term (current) use of insulin: Secondary | ICD-10-CM

## 2022-03-03 DIAGNOSIS — Z0000 Encounter for general adult medical examination without abnormal findings: Secondary | ICD-10-CM | POA: Diagnosis not present

## 2022-03-03 MED ORDER — BLOOD GLUCOSE METER KIT: PACK | 0 refills | Status: DC

## 2022-03-03 NOTE — Progress Notes (Signed)
Subjective:   Stephanie Hammond is a 71 y.o. female who presents for Medicare Annual (Subsequent) preventive examination.  I connected with  Stephanie Hammond on 03/03/22 by a audio enabled telemedicine application and verified that I am speaking with the correct person using two identifiers.  Patient Location: Home  Provider Location: Office/Clinic  I discussed the limitations of evaluation and management by telemedicine. The patient expressed understanding and agreed to proceed.   Review of Systems     Stephanie Hammond , Thank you for taking time to come for your Medicare Wellness Visit. I appreciate your ongoing commitment to your health goals. Please review the following plan we discussed and let me know if I can assist you in the future.   These are the goals we discussed:  Goals      Medication Management     Patient Goals/Self-Care Activities Patient will:  Take medications as prescribed Check blood sugar continuously with continuous glucose monitor, document, and provide at future appointments Check blood pressure at least once daily, document, and provide at future appointments Target a minimum of 150 minutes of moderate intensity exercise weekly Engage in dietary modifications by fewer sweetened foods & beverages Work on stopping or reducing smoking     Quit Smoking     Patient would like to quit smoking she has quit before and would like to do this again         This is a list of the screening recommended for you and due dates:  Health Maintenance  Topic Date Due   Tetanus Vaccine  Never done   Zoster (Shingles) Vaccine (2 of 2) 01/02/2018   COVID-19 Vaccine (4 - Moderna series) 09/16/2020   Eye exam for diabetics  11/18/2020   Complete foot exam   10/04/2021   Flu Shot  03/07/2022   Hemoglobin A1C  06/25/2022   Mammogram  12/20/2023   Colon Cancer Screening  04/28/2029   DEXA scan (bone density measurement)  Completed   Hepatitis C Screening: USPSTF  Recommendation to screen - Ages 28-79 yo.  Completed   HPV Vaccine  Aged Out   Pneumonia Vaccine  Discontinued          Objective:    There were no vitals filed for this visit. There is no height or weight on file to calculate BMI.     12/23/2021   10:15 AM 03/02/2021    8:22 AM 04/25/2019   10:35 AM 01/15/2019    6:18 PM 01/09/2019    6:27 AM 01/02/2019   12:07 PM 01/24/2017   10:45 AM  Advanced Directives  Does Patient Have a Medical Advance Directive? _0  No No  Would patient like information on creating a medical advance directive? Yes (MAU/Ambulatory/Procedural Areas - Information given) Yes (MAU/Ambulatory/Procedural Areas - Information given) No - Patient declined  No - Patient declined No - Patient declined     Current Medications (verified) Outpatient Encounter Medications as of 03/03/2022  Medication Sig   allopurinol (ZYLOPRIM) 300 MG tablet Take 1 tablet (300 mg total) by mouth daily.   amLODipine (NORVASC) 10 MG tablet TAKE ONE TABLET BY MOUTH ONCE DAILY   atorvastatin (LIPITOR) 20 MG tablet TAKE ONE TABLET BY MOUTH EVERY DAY   Blood Glucose Monitoring Suppl (BLOOD GLUCOSE SYSTEM PAK) KIT Please dispense based on patient and insurance preference. Use as directed to monitor FSBS 3x daily. Dx: E11.9   BUTRANS 15 MCG/HR Place 1 patch onto the skin once  a week.   DULoxetine (CYMBALTA) 60 MG capsule Take 60 mg by mouth daily.   Glucagon (GVOKE HYPOPEN 2-PACK) 1 MG/0.2ML SOAJ Inject 0.2 mLs into the skin as needed (Blood glucose less than 53).   Glucose Blood (BLOOD GLUCOSE TEST STRIPS) STRP Please dispense based on patient and insurance preference. Use as directed to monitor FSBS 3x daily. Dx: E11.9  For testing 4 times daily   Insulin Glargine (BASAGLAR KWIKPEN) 100 UNIT/ML Inject 25 Units into the skin at bedtime.   Insulin Pen Needle (B-D UF III MINI PEN NEEDLES) 31G X 5 MM MISC FOR USE with INSULIN PENS   Lancets MISC Please dispense based on patient and  insurance preference. Use as directed to monitor FSBS 3x daily. Dx: E11.9  For testing 4 times daily   losartan (COZAAR) 100 MG tablet ONE TABLET BY MOUTH ONCE DAILY]   metFORMIN (GLUCOPHAGE-XR) 500 MG 24 hr tablet TAKE 2 TABLETS(1000 MG) BY MOUTH DAILY WITH BREAKFAST   NOVOLOG FLEXPEN 100 UNIT/ML FlexPen Sliding Scale: Blood Sugar of 150-200 = TWO units, 201-250 = 4units, 251-300 = 6units, 301-350 = 8units, 351-400 = 10units   NURTEC 75 MG TBDP Take 1 tablet by mouth daily as needed.   omeprazole (PRILOSEC) 20 MG capsule TAKE ONE CAPSULE BY MOUTH ONCE DAILY 30 MINUTES BEFORE morning MEAL   oxyCODONE-acetaminophen (PERCOCET) 10-325 MG tablet Take 1 tablet by mouth 2 (two) times daily.   Semaglutide, 2 MG/DOSE, 8 MG/3ML SOPN Inject 2 mg as directed once a week.   topiramate (TOPAMAX) 25 MG tablet Take 50 mg by mouth daily.   No facility-administered encounter medications on file as of 03/03/2022.    Allergies (verified) Ace inhibitors, Bentyl [dicyclomine hcl], and Penicillins   History: Past Medical History:  Diagnosis Date   Anemia    Blood transfusion 2009   after GI bleed   CAD (coronary artery disease)    Diabetes mellitus    2   Diverticulitis    Diverticulosis    Fear of local anesthetic    01-02-2019, patient states with last surgery in 2016 , she refused to have epidural before she was sedated d/t to extreme aversion to needles near her back     GERD (gastroesophageal reflux disease)    Gout    Headache    occ if sugar is  too hi or low    Heart murmur    "i was told this years and years and years ago "   History of GI diverticular bleed 08/2004   Hypertension    IBS (irritable bowel syndrome)    Osteoarthritis    Tachycardia    "it stays like that for years between 119 and 121 and it doesnt bother me  none "    Past Surgical History:  Procedure Laterality Date   CHOLECYSTECTOMY  2003   COLON SURGERY     COLONOSCOPY  2000   Dr. Fuller Plan: marked diverticulosis,  difficult procedure due to adhesions, polyps benign   COLONOSCOPY  2006   Dr. Henrene Pastor: severe pandiverticulosis   COLONOSCOPY N/A 02/03/2013   SLF: 4 COLORECTAL POLYPS REMOVED/Moderate diverticulosis throughout the entire examined colon/Small internal hemorrhoids   COLONOSCOPY WITH PROPOFOL N/A 04/29/2019   Procedure: COLONOSCOPY WITH PROPOFOL;  Surgeon: Danie Binder, MD;  Location: [facility];  Service: Endoscopy;  Laterality: N/A;  8:30am   JOINT REPLACEMENT Right 2009   Knee Replacement Right    POLYPECTOMY  04/29/2019   Procedure: POLYPECTOMY;  Surgeon: Barney Drain  L, MD;  Location: [facility];  Service: Endoscopy;;  colon   PTCA     over 10 years ago , unsure of exact year , does recall that no stents were placed    REPLACEMENT TOTAL KNEE     right    TOTAL KNEE ARTHROPLASTY Left 03/12/2015   Procedure: LEFT TOTAL KNEE REPLACEMENT;  Surgeon: Carole Civil, MD;  Location: [facility];  Service: Orthopedics;  Laterality: Left;   TOTAL KNEE REVISION Left 01/09/2019   Procedure: TOTAL KNEE REVISION;  Surgeon: Rod Can, MD;  Location: [facility];  Service: Orthopedics;  Laterality: Left;   VAGINAL HYSTERECTOMY     Family History  Problem Relation Age of Onset   Colon polyps Sister    Cancer Mother        lung    Diabetes Mother    Cancer Father        stomach    Stomach cancer Father        questionable   Colon cancer Neg Hx    Social History   Socioeconomic History   Marital status: Married    Spouse name: Not on file   Number of children: 2   Years of education: Not on file   Highest education level: Not on file  Occupational History   Not on file  Tobacco Use   Smoking status: Every Day    Packs/day: 1.00    Years: 35.00    Total pack years: 35.00    Types: Cigarettes    Passive exposure: Current   Smokeless tobacco: Never  Vaping Use   Vaping Use: Never used  Substance and Sexual Activity   Alcohol use: No   Drug use: No   Sexual activity: Not  Currently    Birth control/protection: Surgical    Comment: hyst  Other Topics Concern   Not on file  Social History Narrative   Husband passed away lives alone    Social Determinants of Health   Financial Resource Strain: Low Risk  (03/02/2021)   Overall Financial Resource Strain (CARDIA)    Difficulty of Paying Living Expenses: Not hard at all  Food Insecurity: No Food Insecurity (12/23/2021)   Hunger Vital Sign    Worried About Running Out of Food in the Last Year: Never true    Ran Out of Food in the Last Year: Never true  Transportation Needs: No Transportation Needs (12/23/2021)   PRAPARE - Hydrologist (Medical): No    Lack of Transportation (Non-Medical): No  Physical Activity: Inactive (03/02/2021)   Exercise Vital Sign    Days of Exercise per Week: 0 days    Minutes of Exercise per Session: 0 min  Stress: No Stress Concern Present (03/02/2021)   [place]    Feeling of Stress : Not at all  Social Connections: Moderately Isolated (03/02/2021)   Social Connection and Isolation Panel [NHANES]    Frequency of Communication with Friends and Family: More than three times a week    Frequency of Social Gatherings with Friends and Family: More than three times a week    Attends Religious Services: More than 4 times per year    Active Member of [employer] or Organizations: No    Attends Archivist Meetings: Never    Marital Status: Widowed    Tobacco Counseling Ready to quit: Not Answered Counseling given: Not Answered   Clinical Intake:  Diabetic? Yes   Nutrition Risk Assessment:  Has the patient had any N/V/D within the last 2 months?  No  Does the patient have any non-healing wounds?  No  Has the patient had any unintentional weight loss or weight gain?  No   Diabetes:  Is the patient diabetic?  Yes  If diabetic, was a CBG obtained today?   Yes  Did the patient bring in their glucometer from home?  No  How often do you monitor your CBG's? Up to 4 times a day.   Financial Strains and Diabetes Management:  Are you having any financial strains with the device, your supplies or your medication? No .  Does the patient want to be seen by Chronic Care Management for management of their diabetes?  No  Would the patient like to be referred to a Nutritionist or for Diabetic Management?  No   Diabetic Exams:  Diabetic Eye Exam: Completed 11/19/2019. Overdue for diabetic eye exam. Pt has been advised about the importance in completing this exam. A referral has been placed today. Message sent to referral coordinator for scheduling purposes. Advised pt to expect a call from office referred to regarding appt.  Diabetic Foot Exam: Completed 10/04/2020. Pt has been advised about the importance in completing this exam. .        Activities of Daily Living     No data to display           Patient Care Team: Lindell Spar, MD as PCP - General (Internal Medicine) Danie Binder, MD (Inactive) as Consulting Physician (Gastroenterology) Beryle Lathe, [facility] (Inactive) (Pharmacist) Kassie Mends, RN as [place] Management  Indicate any recent Medical Services you may have received from other than [facility] providers in the past year (date may be approximate).     Assessment:   This is a routine wellness examination for Danissa.  Hearing/Vision screen No results found.  Dietary issues and exercise activities discussed:     Goals Addressed   None   Depression Screen    12/23/2021   11:45 AM 12/23/2021   11:17 AM 12/23/2021   10:14 AM 12/23/2021   10:03 AM 08/26/2021    8:03 AM 03/22/2021   10:22 AM 03/02/2021    8:23 AM  PHQ 2/9 Scores  PHQ - 2 Score 0 0 0 0 0 0 0  PHQ- 9 Score 0     0     Fall Risk    12/23/2021   11:17 AM 12/23/2021   10:00 AM 08/26/2021    8:03 AM 03/22/2021   10:21 AM  03/02/2021    8:22 AM  Fall Risk   Falls in the past year? 0 1 0 0 0  Number falls in past yr: 0 1 0 0 0  Injury with Fall? 0 0 0 0 0  Risk for fall due to : No Fall Risks  No Fall Risks No Fall Risks No Fall Risks  Follow up Falls evaluation completed  Falls evaluation completed Falls evaluation completed Falls evaluation completed    [place]:  Any stairs in or around the home? Yes  If so, are there any without handrails? No  Home free of loose throw rugs in walkways, pet beds, electrical cords, etc? Yes  Adequate lighting in your home to reduce risk of falls? Yes   ASSISTIVE DEVICES UTILIZED TO PREVENT FALLS:  Life alert? No  Use of a cane, walker  or w/c? No  Grab bars in the bathroom? Yes  Shower chair or bench in shower? Yes  Elevated toilet seat or a handicapped toilet? No    Cognitive Function:    03/02/2021    8:23 AM  MMSE - Mini Mental State Exam  Not completed: Unable to complete        03/02/2021    8:23 AM  6CIT Screen  What Year? 0 points  What month? 0 points  What time? 0 points  Count back from 20 0 points  Months in reverse 0 points  Repeat phrase 0 points  Total Score 0 points    Immunizations Immunization History  Administered Date(s) Administered   Moderna Sars-Covid-2 Vaccination 10/01/2019, 10/29/2019, 07/22/2020   PPD Test 09/03/2017, 06/08/2021   Zoster Recombinat (Shingrix) 11/07/2017, 11/07/2017    TDAP status: Up to date  Flu Vaccine status: Up to date   Covid-19 vaccine status: Completed vaccines  Qualifies for Shingles Vaccine? Yes   Zostavax completed Yes   Shingrix Completed?: No.    Education has been provided regarding the importance of this vaccine. Patient has been advised to call insurance company to determine out of pocket expense if they have not yet received this vaccine. Advised may also receive vaccine at local pharmacy or Health Dept. Verbalized acceptance and  understanding.  Screening Tests Health Maintenance  Topic Date Due   TETANUS/TDAP  Never done   Zoster Vaccines- Shingrix (2 of 2) 01/02/2018   COVID-19 Vaccine (4 - Moderna series) 09/16/2020   OPHTHALMOLOGY EXAM  11/18/2020   FOOT EXAM  10/04/2021   INFLUENZA VACCINE  03/07/2022   HEMOGLOBIN A1C  06/25/2022   MAMMOGRAM  12/20/2023   COLONOSCOPY (Pts 45-42yr Insurance coverage will need to be confirmed)  04/28/2029   DEXA SCAN  Completed   Hepatitis C Screening  Completed   HPV VACCINES  Aged Out   Pneumonia Vaccine 71 Years old  Discontinued    Health Maintenance  Health Maintenance Due  Topic Date Due   TETANUS/TDAP  Never done   Zoster Vaccines- Shingrix (2 of 2) 01/02/2018   COVID-19 Vaccine (4 - Moderna series) 09/16/2020   OPHTHALMOLOGY EXAM  11/18/2020   FOOT EXAM  10/04/2021    Colorectal cancer screening: Type of screening: Colonoscopy. Completed 04/29/2019. Repeat every 10 years  Mammogram status: Completed 12/23/2021. Repeat every year  Bone Density status: Completed 08/13/2017. Results reflect: Bone density results: OSTEOPENIA. Repeat every 3 years.  Lung Cancer Screening: (Low Dose CT Chest recommended if Age 69-80years, 30 pack-year currently smoking OR have quit w/in 15years.) does qualify.    Additional Screening:  Hepatitis C Screening: does qualify; Completed   Vision Screening: Recommended annual ophthalmology exams for early detection of glaucoma and other disorders of the eye. Is the patient up to date with their annual eye exam?  No  Who is the provider or what is the name of the office in which the patient attends annual eye exams? [facility]  Dental Screening: Recommended annual dental exams for proper oral hygiene  Community Resource Referral / Chronic Care Management: CRR required this visit?  No   CCM required this visit?  No      Plan:     I have personally reviewed and noted the following in the patient's chart:   Medical  and social history Use of alcohol, tobacco or illicit drugs  Current medications and supplements including opioid prescriptions.  Functional ability and status Nutritional status Physical activity  Advanced directives List of other physicians Hospitalizations, surgeries, and ER visits in previous 12 months Vitals Screenings to include cognitive, depression, and falls Referrals and appointments  In addition, I have reviewed and discussed with patient certain preventive protocols, quality metrics, and best practice recommendations. A written personalized care plan for preventive services as well as general preventive health recommendations were provided to patient.     Johny Drilling, [place]   03/03/2022   Nurse Notes:  Ms. Chirco , Thank you for taking time to come for your Medicare Wellness Visit. I appreciate your ongoing commitment to your health goals. Please review the following plan we discussed and let me know if I can assist you in the future.   These are the goals we discussed:  Goals      Medication Management     Patient Goals/Self-Care Activities Patient will:  Take medications as prescribed Check blood sugar continuously with continuous glucose monitor, document, and provide at future appointments Check blood pressure at least once daily, document, and provide at future appointments Target a minimum of 150 minutes of moderate intensity exercise weekly Engage in dietary modifications by fewer sweetened foods & beverages Work on stopping or reducing smoking     Quit Smoking     Patient would like to quit smoking she has quit before and would like to do this again         This is a list of the screening recommended for you and due dates:  Health Maintenance  Topic Date Due   Tetanus Vaccine  Never done   Zoster (Shingles) Vaccine (2 of 2) 01/02/2018   COVID-19 Vaccine (4 - Moderna series) 09/16/2020   Eye exam for diabetics  11/18/2020   Complete foot exam    10/04/2021   Flu Shot  03/07/2022   Hemoglobin A1C  06/25/2022   Mammogram  12/20/2023   Colon Cancer Screening  04/28/2029   DEXA scan (bone density measurement)  Completed   Hepatitis C Screening: USPSTF Recommendation to screen - Ages 40-79 yo.  Completed   HPV Vaccine  Aged Out   Pneumonia Vaccine  Discontinued

## 2022-03-03 NOTE — Patient Instructions (Signed)
Stephanie Hammond , Thank you for taking time to come for your Medicare Wellness Visit. I appreciate your ongoing commitment to your health goals. Please review the following plan we discussed and let me know if I can assist you in the future.   Screening recommendations/referrals: Colonoscopy: Completed Mammogram: Completed Bone Density: Completed Recommended yearly ophthalmology/optometry visit for glaucoma screening and checkup Recommended yearly dental visit for hygiene and checkup  Vaccinations: Influenza vaccine: Completed Tdap vaccine: Completed Shingles vaccine: Get other one at your pharmacy    Advanced directives: patient declined  Conditions/risks identified: falls, hypertension, diabetes  Next appointment: 1 year   Preventive Care 13 Years and Older, Female Preventive care refers to lifestyle choices and visits with your health care provider that can promote health and wellness. What does preventive care include? A yearly physical exam. This is also called an annual well check. Dental exams once or twice a year. Routine eye exams. Ask your health care provider how often you should have your eyes checked. Personal lifestyle choices, including: Daily care of your teeth and gums. Regular physical activity. Eating a healthy diet. Avoiding tobacco and drug use. Limiting alcohol use. Practicing safe sex. Taking low-dose aspirin every day. Taking vitamin and mineral supplements as recommended by your health care provider. What happens during an annual well check? The services and screenings done by your health care provider during your annual well check will depend on your age, overall health, lifestyle risk factors, and family history of disease. Counseling  Your health care provider may ask you questions about your: Alcohol use. Tobacco use. Drug use. Emotional well-being. Home and relationship well-being. Sexual activity. Eating habits. History of falls. Memory and  ability to understand (cognition). Work and work Statistician. Reproductive health. Screening  You may have the following tests or measurements: Height, weight, and BMI. Blood pressure. Lipid and cholesterol levels. These may be checked every 5 years, or more frequently if you are over 47 years old. Skin check. Lung cancer screening. You may have this screening every year starting at age 35 if you have a 30-pack-year history of smoking and currently smoke or have quit within the past 15 years. Fecal occult blood test (FOBT) of the stool. You may have this test every year starting at age 61. Flexible sigmoidoscopy or colonoscopy. You may have a sigmoidoscopy every 5 years or a colonoscopy every 10 years starting at age 27. Hepatitis C blood test. Hepatitis B blood test. Sexually transmitted disease (STD) testing. Diabetes screening. This is done by checking your blood sugar (glucose) after you have not eaten for a while (fasting). You may have this done every 1-3 years. Bone density scan. This is done to screen for osteoporosis. You may have this done starting at age 19. Mammogram. This may be done every 1-2 years. Talk to your health care provider about how often you should have regular mammograms. Talk with your health care provider about your test results, treatment options, and if necessary, the need for more tests. Vaccines  Your health care provider may recommend certain vaccines, such as: Influenza vaccine. This is recommended every year. Tetanus, diphtheria, and acellular pertussis (Tdap, Td) vaccine. You may need a Td booster every 10 years. Zoster vaccine. You may need this after age 8. Pneumococcal 13-valent conjugate (PCV13) vaccine. One dose is recommended after age 104. Pneumococcal polysaccharide (PPSV23) vaccine. One dose is recommended after age 33. Talk to your health care provider about which screenings and vaccines you need and how often you  need them. This information is  not intended to replace advice given to you by your health care provider. Make sure you discuss any questions you have with your health care provider. Document Released: 08/20/2015 Document Revised: 04/12/2016 Document Reviewed: 05/25/2015 Elsevier Interactive Patient Education  2017 [place] Prevention in the Home Falls can cause injuries. They can happen to people of all ages. There are many things you can do to make your home safe and to help prevent falls. What can I do on the outside of my home? Regularly fix the edges of walkways and driveways and fix any cracks. Remove anything that might make you trip as you walk through a door, such as a raised step or threshold. Trim any bushes or trees on the path to your home. Use bright outdoor lighting. Clear any walking paths of anything that might make someone trip, such as rocks or tools. Regularly check to see if handrails are loose or broken. Make sure that both sides of any steps have handrails. Any raised decks and porches should have guardrails on the edges. Have any leaves, snow, or ice cleared regularly. Use sand or salt on walking paths during winter. Clean up any spills in your garage right away. This includes oil or grease spills. What can I do in the bathroom? Use night lights. Install grab bars by the toilet and in the tub and shower. Do not use towel bars as grab bars. Use non-skid mats or decals in the tub or shower. If you need to sit down in the shower, use a plastic, non-slip stool. Keep the floor dry. Clean up any water that spills on the floor as soon as it happens. Remove soap buildup in the tub or shower regularly. Attach bath mats securely with double-sided non-slip rug tape. Do not have throw rugs and other things on the floor that can make you trip. What can I do in the bedroom? Use night lights. Make sure that you have a light by your bed that is easy to reach. Do not use any sheets or blankets that  are too big for your bed. They should not hang down onto the floor. Have a firm chair that has side arms. You can use this for support while you get dressed. Do not have throw rugs and other things on the floor that can make you trip. What can I do in the kitchen? Clean up any spills right away. Avoid walking on wet floors. Keep items that you use a lot in easy-to-reach places. If you need to reach something above you, use a strong step stool that has a grab bar. Keep electrical cords out of the way. Do not use floor polish or wax that makes floors slippery. If you must use wax, use non-skid floor wax. Do not have throw rugs and other things on the floor that can make you trip. What can I do with my stairs? Do not leave any items on the stairs. Make sure that there are handrails on both sides of the stairs and use them. Fix handrails that are broken or loose. Make sure that handrails are as long as the stairways. Check any carpeting to make sure that it is firmly attached to the stairs. Fix any carpet that is loose or worn. Avoid having throw rugs at the top or bottom of the stairs. If you do have throw rugs, attach them to the floor with carpet tape. Make sure that you have a light switch  at the top of the stairs and the bottom of the stairs. If you do not have them, ask someone to add them for you. What else can I do to help prevent falls? Wear shoes that: Do not have high heels. Have rubber bottoms. Are comfortable and fit you well. Are closed at the toe. Do not wear sandals. If you use a stepladder: Make sure that it is fully opened. Do not climb a closed stepladder. Make sure that both sides of the stepladder are locked into place. Ask someone to hold it for you, if possible. Clearly mark and make sure that you can see: Any grab bars or handrails. First and last steps. Where the edge of each step is. Use tools that help you move around (mobility aids) if they are needed. These  include: Canes. Walkers. Scooters. Crutches. Turn on the lights when you go into a dark area. Replace any light bulbs as soon as they burn out. Set up your furniture so you have a clear path. Avoid moving your furniture around. If any of your floors are uneven, fix them. If there are any pets around you, be aware of where they are. Review your medicines with your doctor. Some medicines can make you feel dizzy. This can increase your chance of falling. Ask your doctor what other things that you can do to help prevent falls. This information is not intended to replace advice given to you by your health care provider. Make sure you discuss any questions you have with your health care provider. Document Released: 05/20/2009 Document Revised: 12/30/2015 Document Reviewed: 08/28/2014 Elsevier Interactive Patient Education  2017 [employer].

## 2022-03-07 ENCOUNTER — Telehealth: Payer: Medicare HMO

## 2022-03-07 ENCOUNTER — Telehealth: Payer: Self-pay | Admitting: *Deleted

## 2022-03-07 ENCOUNTER — Other Ambulatory Visit: Payer: Self-pay | Admitting: *Deleted

## 2022-03-07 NOTE — Telephone Encounter (Signed)
Care Management   Follow Up Note   03/07/2022 Name: Stephanie Hammond MRN: [id number] DOB: July 20, 1951   Referred by: Anabel Halon, MD Reason for referral : Chronic Care Management (DM2, HTN)   An unsuccessful telephone outreach was attempted today. The patient was referred to the case management team for assistance with care management and care coordination.   Follow Up Plan: Telephone follow up appointment with care management team member scheduled for: upon care guide rescheduling.  Irving Shows [facility] BSN RN Case Manager [facility] [phone]

## 2022-03-07 NOTE — Patient Outreach (Signed)
Care Management   Outreach Note  03/07/2022  Name: KRYSTIANNA GLIDDEN MRN: [id number] DOB: 08-May-1951  Reason for Referral: Care Coordination - Assessment of Needs.   An unsuccessful telephone outreach was attempted today. The patient was referred to the case management team for assistance with care management and care coordination. A HIPAA compliant message was left on voicemail for patient, providing contact information for CSW, encouraging patient to return the call at her earliest convenience.   Follow Up Plan:  Scheduling Care Guide will reschedule initial telephone outreach call for patient with CSW.   Danford Bad, BSW, MSW, LCSW  Licensed Restaurant manager, [employer] [facility]  Mailing [place] [address], [place], [place] [address] Physical Address[address] [place], [place] [address] Toll Free Main # [phone] Fax # [phone] Cell # [phone] [email or web address]

## 2022-03-14 ENCOUNTER — Other Ambulatory Visit: Payer: Self-pay | Admitting: *Deleted

## 2022-03-14 NOTE — Patient Outreach (Signed)
Care Coordination   03/14/2022  Name: Stephanie Hammond MRN: [id number] DOB: 08/22/1950   Care Coordination Outreach Attempts:  A second unsuccessful outreach was attempted today to offer the patient with information about available care coordination services as a benefit of their health plan.   HIPAA compliant message left on voicemail, providing contact information for CSW, encouraging patient to return the call at their earliest convenience.  Follow Up Plan:  Additional outreach attempts will be made to offer the patient care coordination information and services.   Encounter Outcome:  No Answer.   Care Coordination Interventions Activated:  No    Care Coordination Interventions:  No, not indicated.    Danford Bad, BSW, MSW, LCSW  Licensed Restaurant manager, [employer] [facility]  Mailing [place] [address] [place], [place] [address] Physical Address[address] [place], [place] [address] Toll Free Main # [phone] Fax # [phone] Cell # [phone] [email or web address]

## 2022-03-20 DIAGNOSIS — M25561 Pain in right knee: Secondary | ICD-10-CM | POA: Diagnosis not present

## 2022-03-20 DIAGNOSIS — M545 Low back pain, unspecified: Secondary | ICD-10-CM | POA: Diagnosis not present

## 2022-03-20 DIAGNOSIS — M25562 Pain in left knee: Secondary | ICD-10-CM | POA: Diagnosis not present

## 2022-03-20 DIAGNOSIS — M5451 Vertebrogenic low back pain: Secondary | ICD-10-CM | POA: Diagnosis not present

## 2022-03-20 DIAGNOSIS — M542 Cervicalgia: Secondary | ICD-10-CM | POA: Diagnosis not present

## 2022-03-20 DIAGNOSIS — Z79891 Long term (current) use of opiate analgesic: Secondary | ICD-10-CM | POA: Diagnosis not present

## 2022-03-20 DIAGNOSIS — G4733 Obstructive sleep apnea (adult) (pediatric): Secondary | ICD-10-CM | POA: Diagnosis not present

## 2022-03-20 DIAGNOSIS — G4459 Other complicated headache syndrome: Secondary | ICD-10-CM | POA: Diagnosis not present

## 2022-03-21 ENCOUNTER — Other Ambulatory Visit: Payer: Self-pay | Admitting: *Deleted

## 2022-03-21 NOTE — Patient Outreach (Signed)
Care Coordination   03/21/2022  Name: Stephanie Hammond MRN: [id number] DOB: 09-28-1950   Care Coordination Outreach Attempts:  A third unsuccessful outreach was attempted today to offer the patient with information about available care coordination services as a benefit of their health plan. HIPAA compliant messages left on voicemail, providing contact information for CSW, encouraging patient to return CSW's call at her earliest convenience.  Follow Up Plan:  No further outreach attempts will be made at this time. We have been unable to contact the patient to offer or enroll patient in care coordination services.  Encounter Outcome:  No Answer.   Care Coordination Interventions Activated:  No.    Care Coordination Interventions:  No, not indicated.    Danford Bad, BSW, MSW, LCSW  Licensed Restaurant manager, [employer] [facility]  Mailing [place] [address] [place], [place] [address] Physical [address], [place], [place] [address] Toll Free Main # [phone] Fax # [phone] Cell # [phone] [email or web address]

## 2022-03-23 ENCOUNTER — Ambulatory Visit: Payer: Self-pay | Admitting: *Deleted

## 2022-03-23 DIAGNOSIS — I10 Essential (primary) hypertension: Secondary | ICD-10-CM

## 2022-03-23 DIAGNOSIS — E1140 Type 2 diabetes mellitus with diabetic neuropathy, unspecified: Secondary | ICD-10-CM

## 2022-03-23 NOTE — Chronic Care Management (AMB) (Signed)
03/23/2022  Stephanie Hammond October 23, 1950 [id number]    Care Management   Follow Up Note   03/23/2022 Name: Stephanie Hammond MRN: [id number] DOB: 11-05-1950   Referred by: Anabel Halon, MD Reason for referral : Chronic Care Management (DM2, HTN)   Per care guide note on 03/22/22 unable to maintain contact with patient, care plan updated and resolved.  Follow Up Plan: No further follow up required: case closure  Irving Shows [employer], BSN RN Case Manager [facility] [phone]

## 2022-03-27 ENCOUNTER — Ambulatory Visit (INDEPENDENT_AMBULATORY_CARE_PROVIDER_SITE_OTHER): Payer: Medicare HMO

## 2022-03-27 DIAGNOSIS — Z0000 Encounter for general adult medical examination without abnormal findings: Secondary | ICD-10-CM

## 2022-03-27 NOTE — Progress Notes (Signed)
Subjective:   Stephanie Hammond is a 71 y.o. female who presents for Medicare Annual (Subsequent) preventive examination. I connected with  Stephanie Hammond on 03/27/22 by a audio enabled telemedicine application and verified that I am speaking with the correct person using two identifiers.  Patient Location: Home  Provider Location: Office/Clinic  I discussed the limitations of evaluation and management by telemedicine. The patient expressed understanding and agreed to proceed.  Review of Systems           Objective:    There were no vitals filed for this visit. There is no height or weight on file to calculate BMI.     03/03/2022    8:17 AM 12/23/2021   10:15 AM 03/02/2021    8:22 AM 04/25/2019   10:35 AM 01/15/2019    6:18 PM 01/09/2019    6:27 AM 01/02/2019   12:07 PM  Advanced Directives  Does Patient Have a Medical Advance Directive? _0  No No  Would patient like information on creating a medical advance directive? No - Patient declined Yes (MAU/Ambulatory/Procedural Areas - Information given) Yes (MAU/Ambulatory/Procedural Areas - Information given) No - Patient declined  No - Patient declined No - Patient declined    Current Medications (verified) Outpatient Encounter Medications as of 03/27/2022  Medication Sig   allopurinol (ZYLOPRIM) 300 MG tablet Take 1 tablet (300 mg total) by mouth daily.   amLODipine (NORVASC) 10 MG tablet TAKE ONE TABLET BY MOUTH ONCE DAILY   atorvastatin (LIPITOR) 20 MG tablet TAKE ONE TABLET BY MOUTH EVERY DAY   blood glucose meter kit and supplies Dispense based on patient and insurance preference. Use up to four times daily as directed. (FOR ICD-10 E10.9, E11.9).   Blood Glucose Monitoring Suppl (BLOOD GLUCOSE SYSTEM PAK) KIT Please dispense based on patient and insurance preference. Use as directed to monitor FSBS 3x daily. Dx: E11.9   BUTRANS 15 MCG/HR Place 1 patch onto the skin once a week.   DULoxetine (CYMBALTA) 60 MG capsule  Take 60 mg by mouth daily.   Glucagon (GVOKE HYPOPEN 2-PACK) 1 MG/0.2ML SOAJ Inject 0.2 mLs into the skin as needed (Blood glucose less than 53).   Glucose Blood (BLOOD GLUCOSE TEST STRIPS) STRP Please dispense based on patient and insurance preference. Use as directed to monitor FSBS 3x daily. Dx: E11.9  For testing 4 times daily   Insulin Glargine (BASAGLAR KWIKPEN) 100 UNIT/ML Inject 25 Units into the skin at bedtime.   Insulin Pen Needle (B-D UF III MINI PEN NEEDLES) 31G X 5 MM MISC FOR USE with INSULIN PENS   Lancets MISC Please dispense based on patient and insurance preference. Use as directed to monitor FSBS 3x daily. Dx: E11.9  For testing 4 times daily   losartan (COZAAR) 100 MG tablet ONE TABLET BY MOUTH ONCE DAILY]   metFORMIN (GLUCOPHAGE-XR) 500 MG 24 hr tablet TAKE 2 TABLETS(1000 MG) BY MOUTH DAILY WITH BREAKFAST   NOVOLOG FLEXPEN 100 UNIT/ML FlexPen Sliding Scale: Blood Sugar of 150-200 = TWO units, 201-250 = 4units, 251-300 = 6units, 301-350 = 8units, 351-400 = 10units   NURTEC 75 MG TBDP Take 1 tablet by mouth daily as needed.   omeprazole (PRILOSEC) 20 MG capsule TAKE ONE CAPSULE BY MOUTH ONCE DAILY 30 MINUTES BEFORE morning MEAL   oxyCODONE-acetaminophen (PERCOCET) 10-325 MG tablet Take 1 tablet by mouth 2 (two) times daily.   Semaglutide, 2 MG/DOSE, 8 MG/3ML SOPN Inject 2 mg as directed once a week.  topiramate (TOPAMAX) 25 MG tablet Take 50 mg by mouth daily.   Vitamin D, Ergocalciferol, (DRISDOL) 1.25 MG (50000 UNIT) CAPS capsule Take 50,000 Units by mouth once a week.   No facility-administered encounter medications on file as of 03/27/2022.    Allergies (verified) Ace inhibitors, Bentyl [dicyclomine hcl], and Penicillins   History: Past Medical History:  Diagnosis Date   Anemia    Blood transfusion 2009   after GI bleed   CAD (coronary artery disease)    Diabetes mellitus    2   Diverticulitis    Diverticulosis    Fear of local anesthetic    01-02-2019,  patient states with last surgery in 2016 , she refused to have epidural before she was sedated d/t to extreme aversion to needles near her back     GERD (gastroesophageal reflux disease)    Gout    Headache    occ if sugar is  too hi or low    Heart murmur    "i was told this years and years and years ago "   History of GI diverticular bleed 08/2004   Hypertension    IBS (irritable bowel syndrome)    Osteoarthritis    Tachycardia    "it stays like that for years between 119 and 121 and it doesnt bother me  none "    Past Surgical History:  Procedure Laterality Date   CHOLECYSTECTOMY  2003   COLON SURGERY     COLONOSCOPY  2000   Dr. Fuller Plan: marked diverticulosis, difficult procedure due to adhesions, polyps benign   COLONOSCOPY  2006   Dr. Henrene Pastor: severe pandiverticulosis   COLONOSCOPY N/A 02/03/2013   SLF: 4 COLORECTAL POLYPS REMOVED/Moderate diverticulosis throughout the entire examined colon/Small internal hemorrhoids   COLONOSCOPY WITH PROPOFOL N/A 04/29/2019   Procedure: COLONOSCOPY WITH PROPOFOL;  Surgeon: Danie Binder, MD;  Location: [facility];  Service: Endoscopy;  Laterality: N/A;  8:30am   JOINT REPLACEMENT Right 2009   Knee Replacement Right    POLYPECTOMY  04/29/2019   Procedure: POLYPECTOMY;  Surgeon: Danie Binder, MD;  Location: [facility];  Service: Endoscopy;;  colon   PTCA     over 10 years ago , unsure of exact year , does recall that no stents were placed    REPLACEMENT TOTAL KNEE     right    TOTAL KNEE ARTHROPLASTY Left 03/12/2015   Procedure: LEFT TOTAL KNEE REPLACEMENT;  Surgeon: Carole Civil, MD;  Location: [facility];  Service: Orthopedics;  Laterality: Left;   TOTAL KNEE REVISION Left 01/09/2019   Procedure: TOTAL KNEE REVISION;  Surgeon: Rod Can, MD;  Location: [facility];  Service: Orthopedics;  Laterality: Left;   VAGINAL HYSTERECTOMY     Family History  Problem Relation Age of Onset   Colon polyps Sister    Cancer Mother        lung     Diabetes Mother    Cancer Father        stomach    Stomach cancer Father        questionable   Colon cancer Neg Hx    Social History   Socioeconomic History   Marital status: Married    Spouse name: Not on file   Number of children: 2   Years of education: Not on file   Highest education level: Not on file  Occupational History   Not on file  Tobacco Use   Smoking status: Every Day  Packs/day: 1.00    Years: 35.00    Total pack years: 35.00    Types: Cigarettes    Passive exposure: Current   Smokeless tobacco: Never  Vaping Use   Vaping Use: Never used  Substance and Sexual Activity   Alcohol use: No   Drug use: No   Sexual activity: Not Currently    Birth control/protection: Surgical    Comment: hyst  Other Topics Concern   Not on file  Social History Narrative   Husband passed away lives alone    Social Determinants of Health   Financial Resource Strain: Low Risk  (03/03/2022)   Overall Financial Resource Strain (CARDIA)    Difficulty of Paying Living Expenses: Not hard at all  Food Insecurity: No Food Insecurity (03/03/2022)   Hunger Vital Sign    Worried About Running Out of Food in the Last Year: Never true    Ran Out of Food in the Last Year: Never true  Transportation Needs: No Transportation Needs (03/03/2022)   PRAPARE - Hydrologist (Medical): No    Lack of Transportation (Non-Medical): No  Physical Activity: Inactive (03/03/2022)   Exercise Vital Sign    Days of Exercise per Week: 0 days    Minutes of Exercise per Session: 0 min  Stress: No Stress Concern Present (03/03/2022)   [place]    Feeling of Stress : Not at all  Social Connections: Moderately Isolated (03/03/2022)   Social Connection and Isolation Panel [NHANES]    Frequency of Communication with Friends and Family: More than three times a week    Frequency of Social Gatherings with Friends  and Family: More than three times a week    Attends Religious Services: More than 4 times per year    Active Member of [employer] or Organizations: No    Attends Archivist Meetings: Never    Marital Status: Widowed    Tobacco Counseling Ready to quit: Not Answered Counseling given: Not Answered   Clinical Intake:                 Diabetic?yes  Nutrition Risk Assessment:  Has the patient had any N/V/D within the last 2 months?  No  Does the patient have any non-healing wounds?  No  Has the patient had any unintentional weight loss or weight gain?  No   Diabetes:  Is the patient diabetic?  Yes  If diabetic, was a CBG obtained today?  No  Did the patient bring in their glucometer from home?  No  How often do you monitor your CBG's? 3 to 4 times a day.   Financial Strains and Diabetes Management:  Are you having any financial strains with the device, your supplies or your medication? No .  Does the patient want to be seen by Chronic Care Management for management of their diabetes?  No  Would the patient like to be referred to a Nutritionist or for Diabetic Management?  No   Diabetic Exams:  Diabetic Eye Exam: Overdue for diabetic eye exam. Pt has been advised about the importance in completing this exam. Patient advised to call and schedule an eye exam. Diabetic Foot Exam: Overdue, Pt has been advised about the importance in completing this exam. Pt is scheduled for diabetic foot exam on  .          Activities of Daily Living    03/03/2022    8:18 AM  In your present state of health, do you have any difficulty performing the following activities:  Hearing? 0  Vision? 0  Difficulty concentrating or making decisions? 0  Walking or climbing stairs? 0  Dressing or bathing? 0  Doing errands, shopping? 0  Preparing Food and eating ? N  Using the Toilet? N  In the past six months, have you accidently leaked urine? Y  Do you have problems with loss of  bowel control? Y  Managing your Medications? N  Managing your Finances? N  Housekeeping or managing your Housekeeping? N    Patient Care Team: Lindell Spar, MD as PCP - General (Internal Medicine) Danie Binder, MD (Inactive) as Consulting Physician (Gastroenterology) Beryle Lathe, [facility] (Inactive) (Pharmacist)  Indicate any recent Medical Services you may have received from other than [facility] providers in the past year (date may be approximate).     Assessment:   This is a routine wellness examination for Neeta.  Hearing/Vision screen No results found.  Dietary issues and exercise activities discussed:     Goals Addressed   None    Depression Screen    03/03/2022    8:18 AM 03/03/2022    8:16 AM 12/23/2021   11:45 AM 12/23/2021   11:17 AM 12/23/2021   10:14 AM 12/23/2021   10:03 AM 08/26/2021    8:03 AM  PHQ 2/9 Scores  PHQ - 2 Score 0 0 0 0 0 0 0  PHQ- 9 Score   0        Fall Risk    03/03/2022    8:18 AM 12/23/2021   11:17 AM 12/23/2021   10:00 AM 08/26/2021    8:03 AM 03/22/2021   10:21 AM  Fall Risk   Falls in the past year? 0 0 1 0 0  Number falls in past yr: 0 0 1 0 0  Injury with Fall? 0 0 0 0 0  Risk for fall due to : No Fall Risks No Fall Risks  No Fall Risks No Fall Risks  Follow up Falls evaluation completed Falls evaluation completed  Falls evaluation completed Falls evaluation completed    FALL RISK PREVENTION PERTAINING TO THE HOME:  Any stairs in or around the home? No  If so, are there any without handrails? No  Home free of loose throw rugs in walkways, pet beds, electrical cords, etc? Yes  Adequate lighting in your home to reduce risk of falls? Yes   ASSISTIVE DEVICES UTILIZED TO PREVENT FALLS:  Life alert? No  Use of a cane, walker or w/c? No  Grab bars in the bathroom? Yes  Shower chair or bench in shower? Yes  Elevated toilet seat or a handicapped toilet? Yes   TIMED UP AND GO:  Was the test performed? No .  Length of  time to ambulate 10 feet:  sec.     Cognitive Function:    03/02/2021    8:23 AM  MMSE - Mini Mental State Exam  Not completed: Unable to complete        03/03/2022    8:19 AM 03/02/2021    8:23 AM  6CIT Screen  What Year? 0 points 0 points  What month? 0 points 0 points  What time? 0 points 0 points  Count back from 20 0 points 0 points  Months in reverse 0 points 0 points  Repeat phrase 0 points 0 points  Total Score 0 points 0 points    Immunizations Immunization History  Administered Date(s) Administered   Moderna Sars-Covid-2 Vaccination 10/01/2019, 10/29/2019, 07/22/2020   PPD Test 09/03/2017, 06/08/2021   Zoster Recombinat (Shingrix) 11/07/2017, 11/07/2017    TDAP status: Due, Education has been provided regarding the importance of this vaccine. Advised may receive this vaccine at local pharmacy or Health Dept. Aware to provide a copy of the vaccination record if obtained from local pharmacy or Health Dept. Verbalized acceptance and understanding.  Flu Vaccine status: Due, Education has been provided regarding the importance of this vaccine. Advised may receive this vaccine at local pharmacy or Health Dept. Aware to provide a copy of the vaccination record if obtained from local pharmacy or Health Dept. Verbalized acceptance and understanding.  Pneumococcal vaccine status: Due, Education has been provided regarding the importance of this vaccine. Advised may receive this vaccine at local pharmacy or Health Dept. Aware to provide a copy of the vaccination record if obtained from local pharmacy or Health Dept. Verbalized acceptance and understanding.  Covid-19 vaccine status: Information provided on how to obtain vaccines.   Qualifies for Shingles Vaccine? Yes   Zostavax completed No   Shingrix Completed?: No.    Education has been provided regarding the importance of this vaccine. Patient has been advised to call insurance company to determine out of pocket expense if  they have not yet received this vaccine. Advised may also receive vaccine at local pharmacy or Health Dept. Verbalized acceptance and understanding.  Screening Tests Health Maintenance  Topic Date Due   TETANUS/TDAP  Never done   Zoster Vaccines- Shingrix (2 of 2) 01/02/2018   COVID-19 Vaccine (4 - Moderna series) 09/16/2020   OPHTHALMOLOGY EXAM  11/18/2020   FOOT EXAM  10/04/2021   INFLUENZA VACCINE  03/07/2022   HEMOGLOBIN A1C  06/25/2022   MAMMOGRAM  12/20/2023   COLONOSCOPY (Pts 45-39yr Insurance coverage will need to be confirmed)  04/28/2029   DEXA SCAN  Completed   Hepatitis C Screening  Completed   HPV VACCINES  Aged Out   Pneumonia Vaccine 71 Years old  Discontinued    Health Maintenance  Health Maintenance Due  Topic Date Due   TETANUS/TDAP  Never done   Zoster Vaccines- Shingrix (2 of 2) 01/02/2018   COVID-19 Vaccine (4 - Moderna series) 09/16/2020   OPHTHALMOLOGY EXAM  11/18/2020   FOOT EXAM  10/04/2021   INFLUENZA VACCINE  03/07/2022    Colorectal cancer screening: Type of screening: Colonoscopy. Completed 04/29/19. Repeat every 10 years  Mammogram status: Completed 12/19/21. Repeat every year  Bone Density status: Completed 08/13/2017. Results reflect: Bone density results: OSTEOPENIA. Repeat every   years.  Lung Cancer Screening: (Low Dose CT Chest recommended if Age 69-80years, 30 pack-year currently smoking OR have quit w/in 15years.) does qualify.   Lung Cancer Screening Referral:   Additional Screening:  Hepatitis C Screening: does not qualify; Completed 06/19/2017  Vision Screening: Recommended annual ophthalmology exams for early detection of glaucoma and other disorders of the eye. Is the patient up to date with their annual eye exam?  No  Who is the provider or what is the name of the office in which the patient attends annual eye exams? MY eye doctor If pt is not established with a provider, would they like to be referred to a provider to  establish care? No .   Dental Screening: Recommended annual dental exams for proper oral hygiene  Community Resource Referral / Chronic Care Management: CRR required this visit?  No   CCM required this visit?  No      Plan:     I have personally reviewed and noted the following in the patient's chart:   Medical and social history Use of alcohol, tobacco or illicit drugs  Current medications and supplements including opioid prescriptions. Patient is not currently taking opioid prescriptions. Functional ability and status Nutritional status Physical activity Advanced directives List of other physicians Hospitalizations, surgeries, and ER visits in previous 12 months Vitals Screenings to include cognitive, depression, and falls Referrals and appointments  In addition, I have reviewed and discussed with patient certain preventive protocols, quality metrics, and best practice recommendations. A written personalized care plan for preventive services as well as general preventive health recommendations were provided to patient.     Jill Side, [place]   03/27/2022   Nurse Notes:

## 2022-03-27 NOTE — Patient Instructions (Signed)
  Stephanie Hammond , Thank you for taking time to come for your Medicare Wellness Visit. I appreciate your ongoing commitment to your health goals. Please review the following plan we discussed and let me know if I can assist you in the future.   These are the goals we discussed:  Goals      Medication Management     Patient Goals/Self-Care Activities Patient will:  Take medications as prescribed Check blood sugar continuously with continuous glucose monitor, document, and provide at future appointments Check blood pressure at least once daily, document, and provide at future appointments Target a minimum of 150 minutes of moderate intensity exercise weekly Engage in dietary modifications by fewer sweetened foods & beverages Work on stopping or reducing smoking     Quit Smoking     Patient would like to quit smoking she has quit before and would like to do this again         This is a list of the screening recommended for you and due dates:  Health Maintenance  Topic Date Due   Tetanus Vaccine  Never done   Zoster (Shingles) Vaccine (2 of 2) 01/02/2018   COVID-19 Vaccine (4 - Moderna series) 09/16/2020   Eye exam for diabetics  11/18/2020   Complete foot exam   10/04/2021   Flu Shot  03/07/2022   Hemoglobin A1C  06/25/2022   Mammogram  12/20/2023   Colon Cancer Screening  04/28/2029   DEXA scan (bone density measurement)  Completed   Hepatitis C Screening: USPSTF Recommendation to screen - Ages 24-79 yo.  Completed   HPV Vaccine  Aged Out   Pneumonia Vaccine  Discontinued

## 2022-04-27 ENCOUNTER — Other Ambulatory Visit: Payer: Self-pay | Admitting: Internal Medicine

## 2022-04-27 DIAGNOSIS — E1140 Type 2 diabetes mellitus with diabetic neuropathy, unspecified: Secondary | ICD-10-CM

## 2022-04-28 ENCOUNTER — Ambulatory Visit: Payer: Medicare HMO | Admitting: Internal Medicine

## 2022-05-01 ENCOUNTER — Ambulatory Visit (INDEPENDENT_AMBULATORY_CARE_PROVIDER_SITE_OTHER): Payer: Medicare HMO | Admitting: Internal Medicine

## 2022-05-01 ENCOUNTER — Other Ambulatory Visit: Payer: Self-pay | Admitting: Internal Medicine

## 2022-05-01 ENCOUNTER — Encounter: Payer: Self-pay | Admitting: Internal Medicine

## 2022-05-01 VITALS — BP 126/62 | HR 105 | Resp 18 | Ht 63.0 in | Wt 208.0 lb

## 2022-05-01 DIAGNOSIS — G894 Chronic pain syndrome: Secondary | ICD-10-CM

## 2022-05-01 DIAGNOSIS — I10 Essential (primary) hypertension: Secondary | ICD-10-CM | POA: Diagnosis not present

## 2022-05-01 DIAGNOSIS — M5136 Other intervertebral disc degeneration, lumbar region: Secondary | ICD-10-CM

## 2022-05-01 DIAGNOSIS — G8929 Other chronic pain: Secondary | ICD-10-CM | POA: Diagnosis not present

## 2022-05-01 DIAGNOSIS — E1140 Type 2 diabetes mellitus with diabetic neuropathy, unspecified: Secondary | ICD-10-CM | POA: Diagnosis not present

## 2022-05-01 DIAGNOSIS — M1000 Idiopathic gout, unspecified site: Secondary | ICD-10-CM | POA: Diagnosis not present

## 2022-05-01 DIAGNOSIS — R519 Headache, unspecified: Secondary | ICD-10-CM

## 2022-05-01 DIAGNOSIS — Z794 Long term (current) use of insulin: Secondary | ICD-10-CM | POA: Diagnosis not present

## 2022-05-01 DIAGNOSIS — E785 Hyperlipidemia, unspecified: Secondary | ICD-10-CM | POA: Diagnosis not present

## 2022-05-01 DIAGNOSIS — Z2821 Immunization not carried out because of patient refusal: Secondary | ICD-10-CM

## 2022-05-01 DIAGNOSIS — Z720 Tobacco use: Secondary | ICD-10-CM | POA: Diagnosis not present

## 2022-05-01 MED ORDER — DEXCOM G6 SENSOR MISC: 2 refills | Status: DC

## 2022-05-01 MED ORDER — DEXCOM G7 RECEIVER DEVI: 0 refills | Status: DC

## 2022-05-01 NOTE — Patient Instructions (Addendum)
Please take Basaglar 15 U at nighttime.  Please stop taking Allopurinol and Topiramate.  Please continue to follow low carb diet and ambulate as tolerated.  You are being referred to [facility] pain clinic.

## 2022-05-01 NOTE — Assessment & Plan Note (Signed)
Lab Results  Component Value Date   HGBA1C 5.0 12/23/2021   HGBA1C 5.0 12/23/2021    On Basaglar 10 U qHS, but takes Basaglar 10-15 U based on nighttime reading, advised to stay with 15 U dose for now On Ozempic 2 mg On Metformin 1000 mg QD  Had Dexcom, but did not like it, but wants to try it again Used to follow up with Endocrinologist in [place] On ARB and statin

## 2022-05-01 NOTE — Assessment & Plan Note (Signed)
On Allopurinol Has not had gouty attack for many years Discontinue allopurinol

## 2022-05-01 NOTE — Assessment & Plan Note (Signed)
BP Readings from Last 1 Encounters:  05/01/22 126/62   Well-controlled with Amlodipine and Losartan Counseled for compliance with the medications Advised DASH diet and moderate exercise/walking, at least 150 mins/week

## 2022-05-01 NOTE — Assessment & Plan Note (Signed)
On topiramate, followed by [facility] neurology Her headache is likely due to hypoglycemia, and topiramate did not headache quality/frequency Discontinue Topiramate

## 2022-05-01 NOTE — Assessment & Plan Note (Signed)
C/o chronic low back pain On Cymbalta and Percocet as needed Referred to [facility] pain clinic as she is currently followed by [facility] neurology and they are closing

## 2022-05-01 NOTE — Progress Notes (Signed)
Established Patient Office Visit  Subjective:  Patient ID: Stephanie Hammond, female    DOB: 05-May-1951  Age: 71 y.o. MRN: [id number]  CC:  Chief Complaint  Patient presents with   Medication Problem    Would like to come off some medications doesn't know why she is taking all these medications     HPI Stephanie Hammond is a 72 y.o. female with past medical history of HTN, HLD, type 2 DM, diverticulitis, s/p partial colectomy, chronic pain syndrome, gout, obesity and tobacco abuse who presents for f/u of her chronic medical conditions.  HTN: BP is well-controlled. Takes medications regularly. Patient denies headache, dizziness, chest pain, dyspnea or palpitations.  She asked if she could come off of 1 medication.  She was advised that her BP is well controlled currently and would be elevated if amlodipine was discontinued.  Type II DM: Her HbA1c was 5.0 in 05/23. She has tolerated Ozempic well. She takes Basaglar 10-15 units according to blood glucose at nighttime. She was advised to take Basaglar 10 U in the last visit, but states that her blood glucose runs above 200 at times and takes Basaglar 15 U when it is above 200. She denies any episode of hypoglycemia recently. She has tried CGM in the past, but did not like it, but wants to try again.  Gout: She takes Allopurinol for it. She states that she has not had gout flareup in the last 15 years.    Past Medical History:  Diagnosis Date   Anemia    Blood transfusion 2009   after GI bleed   CAD (coronary artery disease)    Diabetes mellitus    2   Diverticulitis    Diverticulosis    Fear of local anesthetic    01-02-2019, patient states with last surgery in 2016 , she refused to have epidural before she was sedated d/t to extreme aversion to needles near her back     GERD (gastroesophageal reflux disease)    Gout    Headache    occ if sugar is  too hi or low    Heart murmur    "i was told this years and years and years ago "    History of GI diverticular bleed 08/2004   Hypertension    IBS (irritable bowel syndrome)    Osteoarthritis    Tachycardia    "it stays like that for years between 119 and 121 and it doesnt bother me  none "     Past Surgical History:  Procedure Laterality Date   CHOLECYSTECTOMY  2003   COLON SURGERY     COLONOSCOPY  2000   Dr. Russella Dar: marked diverticulosis, difficult procedure due to adhesions, polyps benign   COLONOSCOPY  2006   Dr. Marina Goodell: severe pandiverticulosis   COLONOSCOPY N/A 02/03/2013   SLF: 4 COLORECTAL POLYPS REMOVED/Moderate diverticulosis throughout the entire examined colon/Small internal hemorrhoids   COLONOSCOPY WITH PROPOFOL N/A 04/29/2019   Procedure: COLONOSCOPY WITH PROPOFOL;  Surgeon: West Bali, MD;  Location: [facility];  Service: Endoscopy;  Laterality: N/A;  8:30am   JOINT REPLACEMENT Right 2009   Knee Replacement Right    POLYPECTOMY  04/29/2019   Procedure: POLYPECTOMY;  Surgeon: West Bali, MD;  Location: [facility];  Service: Endoscopy;;  colon   PTCA     over 10 years ago , unsure of exact year , does recall that no stents were placed    REPLACEMENT TOTAL KNEE  right    TOTAL KNEE ARTHROPLASTY Left 03/12/2015   Procedure: LEFT TOTAL KNEE REPLACEMENT;  Surgeon: Vickki Hearing, MD;  Location: [facility];  Service: Orthopedics;  Laterality: Left;   TOTAL KNEE REVISION Left 01/09/2019   Procedure: TOTAL KNEE REVISION;  Surgeon: Samson Frederic, MD;  Location: [facility];  Service: Orthopedics;  Laterality: Left;   VAGINAL HYSTERECTOMY      Family History  Problem Relation Age of Onset   Colon polyps Sister    Cancer Mother        lung    Diabetes Mother    Cancer Father        stomach    Stomach cancer Father        questionable   Colon cancer Neg Hx     Social History   Socioeconomic History   Marital status: Married    Spouse name: Not on file   Number of children: 2   Years of education: Not on file   Highest education  level: Not on file  Occupational History   Not on file  Tobacco Use   Smoking status: Every Day    Packs/day: 1.00    Years: 35.00    Total pack years: 35.00    Types: Cigarettes    Passive exposure: Current   Smokeless tobacco: Never  Vaping Use   Vaping Use: Never used  Substance and Sexual Activity   Alcohol use: No   Drug use: No   Sexual activity: Not Currently    Birth control/protection: Surgical    Comment: hyst  Other Topics Concern   Not on file  Social History Narrative   Husband passed away lives alone    Social Determinants of Health   Financial Resource Strain: Low Risk  (03/03/2022)   Overall Financial Resource Strain (CARDIA)    Difficulty of Paying Living Expenses: Not hard at all  Food Insecurity: No Food Insecurity (03/03/2022)   Hunger Vital Sign    Worried About Running Out of Food in the Last Year: Never true    Ran Out of Food in the Last Year: Never true  Transportation Needs: No Transportation Needs (03/03/2022)   PRAPARE - Administrator, Civil Service (Medical): No    Lack of Transportation (Non-Medical): No  Physical Activity: Inactive (03/03/2022)   Exercise Vital Sign    Days of Exercise per Week: 0 days    Minutes of Exercise per Session: 0 min  Stress: No Stress Concern Present (03/03/2022)   [employer] of Occupational Health - Occupational Stress Questionnaire    Feeling of Stress : Not at all  Social Connections: Moderately Isolated (03/03/2022)   Social Connection and Isolation Panel [NHANES]    Frequency of Communication with Friends and Family: More than three times a week    Frequency of Social Gatherings with Friends and Family: More than three times a week    Attends Religious Services: More than 4 times per year    Active Member of [employer] or Organizations: No    Attends Banker Meetings: Never    Marital Status: Widowed  Intimate Partner Violence: Not At Risk (03/03/2022)   Humiliation, Afraid, Rape,  and Kick questionnaire    Fear of Current or Ex-Partner: No    Emotionally Abused: No    Physically Abused: No    Sexually Abused: No    Outpatient Medications Prior to Visit  Medication Sig Dispense Refill   atorvastatin (LIPITOR) 20 MG tablet TAKE  ONE TABLET BY MOUTH EVERY DAY 90 tablet 1   blood glucose meter kit and supplies Dispense based on patient and insurance preference. Use up to four times daily as directed. (FOR ICD-10 E10.9, E11.9). 1 each 0   BUTRANS 15 MCG/HR Place 1 patch onto the skin once a week.     DULoxetine (CYMBALTA) 60 MG capsule Take 60 mg by mouth daily.     Glucose Blood (BLOOD GLUCOSE TEST STRIPS) STRP Please dispense based on patient and insurance preference. Use as directed to monitor FSBS 3x daily. Dx: E11.9  For testing 4 times daily 100 strip 11   Insulin Glargine (BASAGLAR KWIKPEN) 100 UNIT/ML Inject 25 Units into the skin at bedtime. 15 mL 3   Insulin Pen Needle (B-D UF III MINI PEN NEEDLES) 31G X 5 MM MISC FOR USE with INSULIN PENS 100 each 10   Lancets MISC Please dispense based on patient and insurance preference. Use as directed to monitor FSBS 3x daily. Dx: E11.9  For testing 4 times daily 100 each 11   losartan (COZAAR) 100 MG tablet ONE TABLET BY MOUTH ONCE DAILY] 90 tablet 1   metFORMIN (GLUCOPHAGE-XR) 500 MG 24 hr tablet TAKE 2 TABLETS(1000 MG) BY MOUTH DAILY WITH BREAKFAST 180 tablet 2   NOVOLOG FLEXPEN 100 UNIT/ML FlexPen Sliding Scale: Blood Sugar of 150-200 = TWO units, 201-250 = 4units, 251-300 = 6units, 301-350 = 8units, 351-400 = 10units 15 mL 10   omeprazole (PRILOSEC) 20 MG capsule TAKE ONE CAPSULE BY MOUTH ONCE DAILY 30 MINUTES BEFORE morning MEAL 30 capsule 11   oxyCODONE-acetaminophen (PERCOCET) 10-325 MG tablet Take 1 tablet by mouth 2 (two) times daily.     Semaglutide, 2 MG/DOSE, 8 MG/3ML SOPN Inject 2 mg as directed once a week. 3 mL 3   topiramate (TOPAMAX) 25 MG tablet Take 50 mg by mouth daily.     Vitamin D, Ergocalciferol,  (DRISDOL) 1.25 MG (50000 UNIT) CAPS capsule Take 50,000 Units by mouth once a week.     allopurinol (ZYLOPRIM) 300 MG tablet Take 1 tablet (300 mg total) by mouth daily. 90 tablet 0   amLODipine (NORVASC) 10 MG tablet TAKE ONE TABLET BY MOUTH ONCE DAILY 90 tablet 1   Blood Glucose Monitoring Suppl (BLOOD GLUCOSE SYSTEM PAK) KIT Please dispense based on patient and insurance preference. Use as directed to monitor FSBS 3x daily. Dx: E11.9 (Patient not taking: Reported on 05/01/2022) 1 kit 1   Continuous Blood Gluc Receiver (DEXCOM G6 RECEIVER) DEVI use to check blood glucose in the morning, at noon, in the evening, and at bedtime. (Patient not taking: Reported on 05/01/2022) 1 each 0   Glucagon (GVOKE HYPOPEN 2-PACK) 1 MG/0.2ML SOAJ Inject 0.2 mLs into the skin as needed (Blood glucose less than 53). (Patient not taking: Reported on 05/01/2022) 0.4 mL 11   NURTEC 75 MG TBDP Take 1 tablet by mouth daily as needed. (Patient not taking: Reported on 05/01/2022)     No facility-administered medications prior to visit.    Allergies  Allergen Reactions   Ace Inhibitors Cough   Bentyl [Dicyclomine Hcl] Other (See Comments)    Blurry vision   Penicillins Itching and Swelling    Whelps Did it involve swelling of the face/tongue/throat, SOB, or low BP? No Did it involve sudden or severe rash/hives, skin peeling, or any reaction on the inside of your mouth or nose? No Did you need to seek medical attention at a hospital or doctor's office? No When did it  last happen?      20 years If all above answers are "NO", may proceed with cephalosporin use.     ROS Review of Systems  Constitutional:  Negative for chills and fever.  HENT:  Negative for congestion, sinus pressure, sinus pain and sore throat.   Eyes:  Negative for pain and discharge.  Respiratory:  Negative for cough and shortness of breath.   Cardiovascular:  Negative for chest pain and palpitations.  Gastrointestinal:  Negative for abdominal  pain, constipation, diarrhea, nausea and vomiting.  Endocrine: Negative for polydipsia and polyuria.  Genitourinary:  Negative for dysuria and hematuria.  Musculoskeletal:  Positive for arthralgias and back pain. Negative for neck pain and neck stiffness.  Skin:  Negative for rash.  Neurological:  Positive for headaches. Negative for dizziness and weakness.  Psychiatric/Behavioral:  Negative for agitation and behavioral problems.       Objective:    Physical Exam Vitals reviewed.  Constitutional:      General: She is not in acute distress.    Appearance: She is obese. She is not diaphoretic.  HENT:     Head: Normocephalic and atraumatic.     Nose: Nose normal.     Mouth/Throat:     Mouth: Mucous membranes are moist.  Eyes:     General: No scleral icterus.    Extraocular Movements: Extraocular movements intact.  Neck:     Vascular: No carotid bruit.  Cardiovascular:     Rate and Rhythm: Normal rate and regular rhythm.     Pulses: Normal pulses.     Heart sounds: Normal heart sounds. No murmur heard. Pulmonary:     Breath sounds: Normal breath sounds. No wheezing or rales.  Musculoskeletal:     Cervical back: Neck supple. No tenderness.     Right lower leg: No edema.     Left lower leg: No edema.  Skin:    General: Skin is warm.     Findings: No rash.  Neurological:     General: No focal deficit present.     Mental Status: She is alert and oriented to person, place, and time.     Sensory: No sensory deficit.     Motor: No weakness.  Psychiatric:        Mood and Affect: Mood normal.        Behavior: Behavior normal.     BP 126/62 (BP Location: Left Arm, Patient Position: Sitting, Cuff Size: Normal)   Pulse (!) 105   Resp 18   Ht 5\' 3"  (1.6 m)   Wt 208 lb (94.3 kg)   SpO2 97%   BMI 36.85 kg/m  Wt Readings from Last 3 Encounters:  05/01/22 208 lb (94.3 kg)  12/23/21 207 lb 12.8 oz (94.3 kg)  11/09/21 207 lb 12.8 oz (94.3 kg)    Lab Results  Component Value  Date   TSH 0.588 05/04/2021   Lab Results  Component Value Date   WBC 6.2 05/04/2021   HGB 12.5 05/04/2021   HCT 35.7 05/04/2021   MCV 92 05/04/2021   PLT 339 05/04/2021   Lab Results  Component Value Date   NA 138 05/04/2021   K 5.4 (H) 05/04/2021   CO2 22 05/04/2021   GLUCOSE 121 (H) 05/04/2021   BUN 20 05/04/2021   CREATININE 0.84 05/04/2021   BILITOT <0.2 05/04/2021   ALKPHOS 162 (H) 05/04/2021   AST 17 05/04/2021   ALT 18 05/04/2021   PROT 7.1 05/04/2021   ALBUMIN 4.3  05/04/2021   CALCIUM 9.4 05/04/2021   ANIONGAP 12 04/25/2019   EGFR 76 05/04/2021   Lab Results  Component Value Date   CHOL 101 05/04/2021   Lab Results  Component Value Date   HDL 44 05/04/2021   Lab Results  Component Value Date   LDLCALC 42 05/04/2021   Lab Results  Component Value Date   TRIG 69 05/04/2021   Lab Results  Component Value Date   CHOLHDL 2.3 05/04/2021   Lab Results  Component Value Date   HGBA1C 5.0 12/23/2021   HGBA1C 5.0 12/23/2021      Assessment & Plan:   Problem List Items Addressed This Visit       Cardiovascular and Mediastinum   Essential hypertension, benign    BP Readings from Last 1 Encounters:  05/01/22 126/62  Well-controlled with Amlodipine and Losartan Counseled for compliance with the medications Advised DASH diet and moderate exercise/walking, at least 150 mins/week        Endocrine   Type 2 diabetes mellitus with diabetic neuropathy (HCC) - Primary    Lab Results  Component Value Date   HGBA1C 5.0 12/23/2021   HGBA1C 5.0 12/23/2021   On Basaglar 10 U qHS, but takes Basaglar 10-15 U based on nighttime reading, advised to stay with 15 U dose for now On Ozempic 2 mg On Metformin 1000 mg QD  Had Dexcom, but did not like it, but wants to try it again Used to follow up with Endocrinologist in [place] On ARB and statin      Relevant Medications   Continuous Blood Gluc Receiver (DEXCOM G7 RECEIVER) DEVI   Continuous Blood Gluc  Sensor (DEXCOM G6 SENSOR) MISC   Other Relevant Orders   Microalbumin / creatinine urine ratio     Musculoskeletal and Integument   DDD (degenerative disc disease), lumbar    C/o chronic low back pain On Cymbalta and Percocet as needed Referred to [facility] pain clinic as she is currently followed by [facility] neurology and they are closing      Relevant Orders   Ambulatory referral to Pain Clinic     Other   Gout    On Allopurinol Has not had gouty attack for many years Discontinue allopurinol      Chronic pain syndrome   Relevant Orders   Ambulatory referral to Pain Clinic   Chronic nonintractable headache    On topiramate, followed by [facility] neurology Her headache is likely due to hypoglycemia, and topiramate did not headache quality/frequency Discontinue Topiramate      Other Visit Diagnoses     Refused influenza vaccine           Meds ordered this encounter  Medications   Continuous Blood Gluc Receiver (DEXCOM G7 RECEIVER) DEVI    Sig: Use it to check blood glucose 3 times before meals, at bedtime and as needed.    Dispense:  1 each    Refill:  0   Continuous Blood Gluc Sensor (DEXCOM G6 SENSOR) MISC    Sig: Please use it to check blood glucose before meals, at bedtime and as needed. Please change it every 10 days.    Dispense:  3 each    Refill:  2    Follow-up: Return in about 4 months (around 08/31/2022) for DM.    Anabel Halon, MD

## 2022-05-02 LAB — LIPID PANEL
Chol/HDL Ratio: 2.3 ratio (ref 0.0–4.4)
Cholesterol, Total: 97 mg/dL — ABNORMAL LOW (ref 100–199)
HDL: 43 mg/dL (ref >39)
LDL Chol Calc (NIH): 39 mg/dL (ref 0–99)
Triglycerides: 67 mg/dL (ref 0–149)
VLDL Cholesterol Cal: 15 mg/dL (ref 5–40)

## 2022-05-02 LAB — CBC WITH DIFFERENTIAL/PLATELET
Basophils Absolute: 0.1 10*3/uL (ref 0.0–0.2)
Basos: 1 %
EOS (ABSOLUTE): 0.1 10*3/uL (ref 0.0–0.4)
Eos: 2 %
Hematocrit: 34.4 % (ref 34.0–46.6)
Hemoglobin: 11.8 g/dL (ref 11.1–15.9)
Immature Grans (Abs): 0 10*3/uL (ref 0.0–0.1)
Immature Granulocytes: 1 %
Lymphocytes Absolute: 1.5 10*3/uL (ref 0.7–3.1)
Lymphs: 27 %
MCH: 32.2 pg (ref 26.6–33.0)
MCHC: 34.3 g/dL (ref 31.5–35.7)
MCV: 94 fL (ref 79–97)
Monocytes Absolute: 0.3 10*3/uL (ref 0.1–0.9)
Monocytes: 6 %
Neutrophils Absolute: 3.5 10*3/uL (ref 1.4–7.0)
Neutrophils: 63 %
Platelets: 315 10*3/uL (ref 150–450)
RBC: 3.66 x10E6/uL — ABNORMAL LOW (ref 3.77–5.28)
RDW: 13.9 % (ref 11.7–15.4)
WBC: 5.5 10*3/uL (ref 3.4–10.8)

## 2022-05-02 LAB — HEMOGLOBIN A1C
Est. average glucose Bld gHb Est-mCnc: 105 mg/dL
Hgb A1c MFr Bld: 5.3 % (ref 4.8–5.6)

## 2022-05-02 LAB — CMP14+EGFR
ALT: 12 IU/L (ref 0–32)
AST: 13 IU/L (ref 0–40)
Albumin/Globulin Ratio: 1.6 (ref 1.2–2.2)
Albumin: 4.1 g/dL (ref 3.9–4.9)
Alkaline Phosphatase: 126 IU/L — ABNORMAL HIGH (ref 44–121)
BUN/Creatinine Ratio: 19 (ref 12–28)
BUN: 17 mg/dL (ref 8–27)
Bilirubin Total: 0.2 mg/dL (ref 0.0–1.2)
CO2: 21 mmol/L (ref 20–29)
Calcium: 9.3 mg/dL (ref 8.7–10.3)
Chloride: 107 mmol/L — ABNORMAL HIGH (ref 96–106)
Creatinine, Ser: 0.89 mg/dL (ref 0.57–1.00)
Globulin, Total: 2.5 g/dL (ref 1.5–4.5)
Glucose: 169 mg/dL — ABNORMAL HIGH (ref 70–99)
Potassium: 4.8 mmol/L (ref 3.5–5.2)
Sodium: 141 mmol/L (ref 134–144)
Total Protein: 6.6 g/dL (ref 6.0–8.5)
eGFR: 70 mL/min/{1.73_m2} (ref >59)

## 2022-05-02 LAB — TSH: TSH: 0.987 u[IU]/mL (ref 0.450–4.500)

## 2022-05-12 ENCOUNTER — Encounter: Payer: Medicare HMO | Admitting: Internal Medicine

## 2022-05-15 ENCOUNTER — Ambulatory Visit: Payer: Medicare HMO | Admitting: Internal Medicine

## 2022-05-15 DIAGNOSIS — M545 Low back pain, unspecified: Secondary | ICD-10-CM | POA: Diagnosis not present

## 2022-05-15 DIAGNOSIS — M25561 Pain in right knee: Secondary | ICD-10-CM | POA: Diagnosis not present

## 2022-05-15 DIAGNOSIS — M25562 Pain in left knee: Secondary | ICD-10-CM | POA: Diagnosis not present

## 2022-05-15 DIAGNOSIS — Z79891 Long term (current) use of opiate analgesic: Secondary | ICD-10-CM | POA: Diagnosis not present

## 2022-05-15 DIAGNOSIS — G4733 Obstructive sleep apnea (adult) (pediatric): Secondary | ICD-10-CM | POA: Diagnosis not present

## 2022-05-15 DIAGNOSIS — G4459 Other complicated headache syndrome: Secondary | ICD-10-CM | POA: Diagnosis not present

## 2022-05-15 DIAGNOSIS — M542 Cervicalgia: Secondary | ICD-10-CM | POA: Diagnosis not present

## 2022-05-25 ENCOUNTER — Telehealth: Payer: Self-pay | Admitting: Internal Medicine

## 2022-05-25 NOTE — Telephone Encounter (Signed)
Please advise 

## 2022-05-25 NOTE — Telephone Encounter (Signed)
Patient called in regard to pain management referral.   Patient does not want to go to the facility referred to. Has heard bad things.  Would like to have a new pain management referral placed

## 2022-05-26 NOTE — Telephone Encounter (Signed)
Spoke with patient It wasn't pt needed new referral she heard things about current facility and wanted to verify.  Pt will stay where she is scheduled.

## 2022-05-30 ENCOUNTER — Other Ambulatory Visit: Payer: Self-pay | Admitting: Internal Medicine

## 2022-05-30 DIAGNOSIS — E1140 Type 2 diabetes mellitus with diabetic neuropathy, unspecified: Secondary | ICD-10-CM

## 2022-05-30 DIAGNOSIS — E785 Hyperlipidemia, unspecified: Secondary | ICD-10-CM

## 2022-06-02 ENCOUNTER — Encounter: Payer: Self-pay | Admitting: Internal Medicine

## 2022-06-02 ENCOUNTER — Ambulatory Visit (INDEPENDENT_AMBULATORY_CARE_PROVIDER_SITE_OTHER): Payer: Medicare HMO | Admitting: Internal Medicine

## 2022-06-02 VITALS — BP 132/78 | HR 94 | Resp 18 | Ht 63.0 in | Wt 203.2 lb

## 2022-06-02 DIAGNOSIS — F5101 Primary insomnia: Secondary | ICD-10-CM | POA: Diagnosis not present

## 2022-06-02 DIAGNOSIS — Z122 Encounter for screening for malignant neoplasm of respiratory organs: Secondary | ICD-10-CM

## 2022-06-02 DIAGNOSIS — Z720 Tobacco use: Secondary | ICD-10-CM | POA: Diagnosis not present

## 2022-06-02 DIAGNOSIS — R5382 Chronic fatigue, unspecified: Secondary | ICD-10-CM

## 2022-06-02 DIAGNOSIS — R69 Illness, unspecified: Secondary | ICD-10-CM | POA: Diagnosis not present

## 2022-06-02 NOTE — Progress Notes (Signed)
Established Patient Office Visit  Subjective:  Patient ID: Stephanie Hammond, female    DOB: 07/08/1951  Age: 71 y.o. MRN: [id number]  CC:  Chief Complaint  Patient presents with   Follow-up    Follow up low energy     HPI JOVAN BONA is a 71 y.o. female with past medical history of HTN, HLD, type 2 DM, diverticulitis, s/p partial colectomy, chronic pain syndrome, gout, obesity and tobacco abuse who presents for complaint of chronic fatigue.  She has been having chronic fatigue.  She denies any recent change in weight or appetite.  Denies any fever, chills, chronic cough/hemoptysis, night sweats or LAD.  She has smoking history, 1 pack/day.  She currently denies any dyspnea or wheezing.  Denies any melena or hematochezia.  Her TSH has been WNL.  Her blood glucose has been well controlled as well.  She asks about sea moss supplement.  Past Medical History:  Diagnosis Date   Anemia    Blood transfusion 2009   after GI bleed   CAD (coronary artery disease)    Diabetes mellitus    2   Diverticulitis    Diverticulosis    Fear of local anesthetic    01-02-2019, patient states with last surgery in 2016 , she refused to have epidural before she was sedated d/t to extreme aversion to needles near her back     GERD (gastroesophageal reflux disease)    Gout    Headache    occ if sugar is  too hi or low    Heart murmur    "i was told this years and years and years ago "   History of GI diverticular bleed 08/2004   Hypertension    IBS (irritable bowel syndrome)    Osteoarthritis    Tachycardia    "it stays like that for years between 119 and 121 and it doesnt bother me  none "     Past Surgical History:  Procedure Laterality Date   CHOLECYSTECTOMY  2003   COLON SURGERY     COLONOSCOPY  2000   Dr. Russella Dar: marked diverticulosis, difficult procedure due to adhesions, polyps benign   COLONOSCOPY  2006   Dr. Marina Goodell: severe pandiverticulosis   COLONOSCOPY N/A 02/03/2013   SLF: 4  COLORECTAL POLYPS REMOVED/Moderate diverticulosis throughout the entire examined colon/Small internal hemorrhoids   COLONOSCOPY WITH PROPOFOL N/A 04/29/2019   Procedure: COLONOSCOPY WITH PROPOFOL;  Surgeon: West Bali, MD;  [facility];  Service: Endoscopy;  Laterality: N/A;  8:30am   JOINT REPLACEMENT Right 2009   Knee Replacement Right    POLYPECTOMY  04/29/2019   Procedure: POLYPECTOMY;  Surgeon: West Bali, MD;  [facility]: [facility];  Service: Endoscopy;;  colon   PTCA     over 10 years ago , unsure of exact year , does recall that no stents were placed    REPLACEMENT TOTAL KNEE     right    TOTAL KNEE ARTHROPLASTY Left 03/12/2015   Procedure: LEFT TOTAL KNEE REPLACEMENT;  Surgeon: Vickki Hearing, MD;  Location: [facility];  Service: Orthopedics;  Laterality: Left;   TOTAL KNEE REVISION Left 01/09/2019   Procedure: TOTAL KNEE REVISION;  Surgeon: Samson Frederic, MD;  Location: [facility];  Service: Orthopedics;  Laterality: Left;   VAGINAL HYSTERECTOMY      Family History  Problem Relation Age of Onset   Colon polyps Sister    Cancer Mother  Established Patient Office Visit  Subjective:  Patient ID: Stephanie Hammond, female    DOB: 1950/09/29  Age: 71 y.o. MRN: [id number]  CC:  Chief Complaint  Patient presents with   Follow-up    Follow up low energy     HPI Stephanie Hammond is a 71 y.o. female with past medical history of HTN, HLD, type 2 DM, diverticulitis, s/p partial colectomy, chronic pain syndrome, gout, obesity and tobacco abuse who presents for complaint of chronic fatigue.  She has been having chronic fatigue.  She denies any recent change in weight or appetite.  Denies any fever, chills, chronic cough/hemoptysis, night sweats or LAD.  She has smoking history, 1 pack/day.  She currently denies any dyspnea or wheezing.  Denies any melena or hematochezia.  Her TSH has been WNL.  Her blood glucose has been well controlled as well.  She asks about sea moss supplement.  Past Medical History:  Diagnosis Date   Anemia    Blood transfusion 2009   after GI bleed   CAD (coronary artery disease)    Diabetes mellitus    2   Diverticulitis    Diverticulosis    Fear of local anesthetic    01-02-2019, patient states with last surgery in 2016 , she refused to have epidural before she was sedated d/t to extreme aversion to needles near her back     GERD (gastroesophageal reflux disease)    Gout    Headache    occ if sugar is  too hi or low    Heart murmur    "i was told this years and years and years ago "   History of GI diverticular bleed 08/2004   Hypertension    IBS (irritable bowel syndrome)    Osteoarthritis    Tachycardia    "it stays like that for years between 119 and 121 and it doesnt bother me  none "     Past Surgical History:  Procedure Laterality Date   CHOLECYSTECTOMY  2003   COLON SURGERY     COLONOSCOPY  2000   Dr. Fuller Plan: marked diverticulosis, difficult procedure due to adhesions, polyps benign   COLONOSCOPY  2006   Dr. Henrene Pastor: severe pandiverticulosis   COLONOSCOPY N/A 02/03/2013   SLF: 4  COLORECTAL POLYPS REMOVED/Moderate diverticulosis throughout the entire examined colon/Small internal hemorrhoids   COLONOSCOPY WITH PROPOFOL N/A 04/29/2019   Procedure: COLONOSCOPY WITH PROPOFOL;  Surgeon: Danie Binder, MD;  [facility];  Service: Endoscopy;  Laterality: N/A;  8:30am   JOINT REPLACEMENT Right 2009   Knee Replacement Right    POLYPECTOMY  04/29/2019   Procedure: POLYPECTOMY;  Surgeon: Danie Binder, MD;  [facility];  Service: Endoscopy;;  colon   PTCA     over 10 years ago , unsure of exact year , does recall that no stents were placed    REPLACEMENT TOTAL KNEE     right    TOTAL KNEE ARTHROPLASTY Left 03/12/2015   Procedure: LEFT TOTAL KNEE REPLACEMENT;  Surgeon: Carole Civil, MD;  Location: [facility];  Service: Orthopedics;  Laterality: Left;   TOTAL KNEE REVISION Left 01/09/2019   Procedure: TOTAL KNEE REVISION;  Surgeon: Rod Can, MD;  Location: [facility];  Service: Orthopedics;  Laterality: Left;   VAGINAL HYSTERECTOMY      Family History  Problem Relation Age of Onset   Colon polyps Sister    Cancer Mother  Established Patient Office Visit  Subjective:  Patient ID: Stephanie Hammond, female    DOB: 1950/09/29  Age: 71 y.o. MRN: [id number]  CC:  Chief Complaint  Patient presents with   Follow-up    Follow up low energy     HPI Stephanie Hammond is a 71 y.o. female with past medical history of HTN, HLD, type 2 DM, diverticulitis, s/p partial colectomy, chronic pain syndrome, gout, obesity and tobacco abuse who presents for complaint of chronic fatigue.  She has been having chronic fatigue.  She denies any recent change in weight or appetite.  Denies any fever, chills, chronic cough/hemoptysis, night sweats or LAD.  She has smoking history, 1 pack/day.  She currently denies any dyspnea or wheezing.  Denies any melena or hematochezia.  Her TSH has been WNL.  Her blood glucose has been well controlled as well.  She asks about sea moss supplement.  Past Medical History:  Diagnosis Date   Anemia    Blood transfusion 2009   after GI bleed   CAD (coronary artery disease)    Diabetes mellitus    2   Diverticulitis    Diverticulosis    Fear of local anesthetic    01-02-2019, patient states with last surgery in 2016 , she refused to have epidural before she was sedated d/t to extreme aversion to needles near her back     GERD (gastroesophageal reflux disease)    Gout    Headache    occ if sugar is  too hi or low    Heart murmur    "i was told this years and years and years ago "   History of GI diverticular bleed 08/2004   Hypertension    IBS (irritable bowel syndrome)    Osteoarthritis    Tachycardia    "it stays like that for years between 119 and 121 and it doesnt bother me  none "     Past Surgical History:  Procedure Laterality Date   CHOLECYSTECTOMY  2003   COLON SURGERY     COLONOSCOPY  2000   Dr. Fuller Plan: marked diverticulosis, difficult procedure due to adhesions, polyps benign   COLONOSCOPY  2006   Dr. Henrene Pastor: severe pandiverticulosis   COLONOSCOPY N/A 02/03/2013   SLF: 4  COLORECTAL POLYPS REMOVED/Moderate diverticulosis throughout the entire examined colon/Small internal hemorrhoids   COLONOSCOPY WITH PROPOFOL N/A 04/29/2019   Procedure: COLONOSCOPY WITH PROPOFOL;  Surgeon: Danie Binder, MD;  [facility];  Service: Endoscopy;  Laterality: N/A;  8:30am   JOINT REPLACEMENT Right 2009   Knee Replacement Right    POLYPECTOMY  04/29/2019   Procedure: POLYPECTOMY;  Surgeon: Danie Binder, MD;  [facility];  Service: Endoscopy;;  colon   PTCA     over 10 years ago , unsure of exact year , does recall that no stents were placed    REPLACEMENT TOTAL KNEE     right    TOTAL KNEE ARTHROPLASTY Left 03/12/2015   Procedure: LEFT TOTAL KNEE REPLACEMENT;  Surgeon: Carole Civil, MD;  Location: [facility];  Service: Orthopedics;  Laterality: Left;   TOTAL KNEE REVISION Left 01/09/2019   Procedure: TOTAL KNEE REVISION;  Surgeon: Rod Can, MD;  [facility];  Service: Orthopedics;  Laterality: Left;   VAGINAL HYSTERECTOMY      Family History  Problem Relation Age of Onset   Colon polyps Sister    Cancer Mother  Established Patient Office Visit  Subjective:  Patient ID: Stephanie Hammond, female    DOB: 1950/09/29  Age: 71 y.o. MRN: [id number]  CC:  Chief Complaint  Patient presents with   Follow-up    Follow up low energy     HPI Stephanie Hammond is a 71 y.o. female with past medical history of HTN, HLD, type 2 DM, diverticulitis, s/p partial colectomy, chronic pain syndrome, gout, obesity and tobacco abuse who presents for complaint of chronic fatigue.  She has been having chronic fatigue.  She denies any recent change in weight or appetite.  Denies any fever, chills, chronic cough/hemoptysis, night sweats or LAD.  She has smoking history, 1 pack/day.  She currently denies any dyspnea or wheezing.  Denies any melena or hematochezia.  Her TSH has been WNL.  Her blood glucose has been well controlled as well.  She asks about sea moss supplement.  Past Medical History:  Diagnosis Date   Anemia    Blood transfusion 2009   after GI bleed   CAD (coronary artery disease)    Diabetes mellitus    2   Diverticulitis    Diverticulosis    Fear of local anesthetic    01-02-2019, patient states with last surgery in 2016 , she refused to have epidural before she was sedated d/t to extreme aversion to needles near her back     GERD (gastroesophageal reflux disease)    Gout    Headache    occ if sugar is  too hi or low    Heart murmur    "i was told this years and years and years ago "   History of GI diverticular bleed 08/2004   Hypertension    IBS (irritable bowel syndrome)    Osteoarthritis    Tachycardia    "it stays like that for years between 119 and 121 and it doesnt bother me  none "     Past Surgical History:  Procedure Laterality Date   CHOLECYSTECTOMY  2003   COLON SURGERY     COLONOSCOPY  2000   Dr. Fuller Plan: marked diverticulosis, difficult procedure due to adhesions, polyps benign   COLONOSCOPY  2006   Dr. Henrene Pastor: severe pandiverticulosis   COLONOSCOPY N/A 02/03/2013   SLF: 4  COLORECTAL POLYPS REMOVED/Moderate diverticulosis throughout the entire examined colon/Small internal hemorrhoids   COLONOSCOPY WITH PROPOFOL N/A 04/29/2019   Procedure: COLONOSCOPY WITH PROPOFOL;  Surgeon: Danie Binder, MD;  [facility];  Service: Endoscopy;  Laterality: N/A;  8:30am   JOINT REPLACEMENT Right 2009   Knee Replacement Right    POLYPECTOMY  04/29/2019   Procedure: POLYPECTOMY;  Surgeon: Danie Binder, MD;  [facility];  Service: Endoscopy;;  colon   PTCA     over 10 years ago , unsure of exact year , does recall that no stents were placed    REPLACEMENT TOTAL KNEE     right    TOTAL KNEE ARTHROPLASTY Left 03/12/2015   Procedure: LEFT TOTAL KNEE REPLACEMENT;  Surgeon: Carole Civil, MD;  Location: [facility];  Service: Orthopedics;  Laterality: Left;   TOTAL KNEE REVISION Left 01/09/2019   Procedure: TOTAL KNEE REVISION;  Surgeon: Rod Can, MD;  Location[facility];  Service: Orthopedics;  Laterality: Left;   VAGINAL HYSTERECTOMY      Family History  Problem Relation Age of Onset   Colon polyps Sister    Cancer Mother  Established Patient Office Visit  Subjective:  Patient ID: Stephanie Hammond, female    DOB: 1950/09/29  Age: 71 y.o. MRN: [id number]  CC:  Chief Complaint  Patient presents with   Follow-up    Follow up low energy     HPI Stephanie Hammond is a 71 y.o. female with past medical history of HTN, HLD, type 2 DM, diverticulitis, s/p partial colectomy, chronic pain syndrome, gout, obesity and tobacco abuse who presents for complaint of chronic fatigue.  She has been having chronic fatigue.  She denies any recent change in weight or appetite.  Denies any fever, chills, chronic cough/hemoptysis, night sweats or LAD.  She has smoking history, 1 pack/day.  She currently denies any dyspnea or wheezing.  Denies any melena or hematochezia.  Her TSH has been WNL.  Her blood glucose has been well controlled as well.  She asks about sea moss supplement.  Past Medical History:  Diagnosis Date   Anemia    Blood transfusion 2009   after GI bleed   CAD (coronary artery disease)    Diabetes mellitus    2   Diverticulitis    Diverticulosis    Fear of local anesthetic    01-02-2019, patient states with last surgery in 2016 , she refused to have epidural before she was sedated d/t to extreme aversion to needles near her back     GERD (gastroesophageal reflux disease)    Gout    Headache    occ if sugar is  too hi or low    Heart murmur    "i was told this years and years and years ago "   History of GI diverticular bleed 08/2004   Hypertension    IBS (irritable bowel syndrome)    Osteoarthritis    Tachycardia    "it stays like that for years between 119 and 121 and it doesnt bother me  none "     Past Surgical History:  Procedure Laterality Date   CHOLECYSTECTOMY  2003   COLON SURGERY     COLONOSCOPY  2000   Dr. Fuller Plan: marked diverticulosis, difficult procedure due to adhesions, polyps benign   COLONOSCOPY  2006   Dr. Henrene Pastor: severe pandiverticulosis   COLONOSCOPY N/A 02/03/2013   SLF: 4  COLORECTAL POLYPS REMOVED/Moderate diverticulosis throughout the entire examined colon/Small internal hemorrhoids   COLONOSCOPY WITH PROPOFOL N/A 04/29/2019   Procedure: COLONOSCOPY WITH PROPOFOL;  Surgeon: Danie Binder, MD;  [facility];  Service: Endoscopy;  Laterality: N/A;  8:30am   JOINT REPLACEMENT Right 2009   Knee Replacement Right    POLYPECTOMY  04/29/2019   Procedure: POLYPECTOMY;  Surgeon: Danie Binder, MD;  [facility];  Service: Endoscopy;;  colon   PTCA     over 10 years ago , unsure of exact year , does recall that no stents were placed    REPLACEMENT TOTAL KNEE     right    TOTAL KNEE ARTHROPLASTY Left 03/12/2015   Procedure: LEFT TOTAL KNEE REPLACEMENT;  Surgeon: Carole Civil, MD;  Location: [facility];  Service: Orthopedics;  Laterality: Left;   TOTAL KNEE REVISION Left 01/09/2019   Procedure: TOTAL KNEE REVISION;  Surgeon: Rod Can, MD;  Location: [facility];  Service: Orthopedics;  Laterality: Left;   VAGINAL HYSTERECTOMY      Family History  Problem Relation Age of Onset   Colon polyps Sister    Cancer Mother

## 2022-06-02 NOTE — Assessment & Plan Note (Signed)
She sleeps only 4 to 5 hours a day Chronic insomnia can lead to fatigue as well She lives alone, does not report apneic episode, but is not clear about snoring Offered home sleep study for evaluation of insomnia and chronic fatigue, but she denies May be a good candidate for amitriptyline, but she denies

## 2022-06-02 NOTE — Assessment & Plan Note (Addendum)
Could be multifactorial, in the setting of chronic medical conditions - type 2 DM, CAD and insomnia She is on chronic opioids, which can also cause fatigue Her BP and blood glucose have been WNL She is a current smoker, needs low-dose CT chest for lung cancer screening Hb low normal, advised to start taking iron supplement Advised to take women's health multivitamin - unclear if sea moss would add any extra benefit

## 2022-06-02 NOTE — Patient Instructions (Signed)
Please start taking ferrous sulfate 325 mg once daily.  Please continue to take medications as prescribed.  Please take B complex multivitamins - Women's health multivitamin once a day.

## 2022-06-02 NOTE — Assessment & Plan Note (Signed)
Smokes about 1 pack/day  Asked about quitting: confirms that she currently smokes cigarettes Advise to quit smoking: Educated about QUITTING to reduce the risk of cancer, cardio and cerebrovascular disease. Assess willingness: Unwilling to quit at this time, but is working on cutting back. Assist with counseling and pharmacotherapy: Counseled for 5 minutes and literature provided. Arrange for follow up: Follow up in 3 months and continue to offer help.

## 2022-06-06 ENCOUNTER — Other Ambulatory Visit: Payer: Self-pay | Admitting: *Deleted

## 2022-06-06 ENCOUNTER — Telehealth: Payer: Self-pay

## 2022-06-06 MED ORDER — BLOOD GLUCOSE TEST VI STRP: ORAL_STRIP | 11 refills | Status: DC

## 2022-06-06 NOTE — Telephone Encounter (Signed)
Dexcom removed from med list and strips sent to [facility] for patient

## 2022-06-06 NOTE — Telephone Encounter (Signed)
Patient called said she does not use the dexcom and free style because her insurance denied.    Please call patient back [phone].

## 2022-06-08 ENCOUNTER — Other Ambulatory Visit: Payer: Self-pay | Admitting: Internal Medicine

## 2022-06-08 DIAGNOSIS — E1140 Type 2 diabetes mellitus with diabetic neuropathy, unspecified: Secondary | ICD-10-CM

## 2022-06-08 NOTE — Telephone Encounter (Signed)
Phar called stating that a refill request was sent wrong. States pt needs the ULTICARE Ultra Fine Needles, 4ML--32 gauge. Can you please send these in to [facility]?      Luellen Pucker [place], [phone], opt 2

## 2022-06-14 ENCOUNTER — Encounter: Payer: Self-pay | Admitting: Internal Medicine

## 2022-06-14 ENCOUNTER — Ambulatory Visit (INDEPENDENT_AMBULATORY_CARE_PROVIDER_SITE_OTHER): Payer: Medicare HMO | Admitting: Internal Medicine

## 2022-06-14 DIAGNOSIS — J309 Allergic rhinitis, unspecified: Secondary | ICD-10-CM | POA: Diagnosis not present

## 2022-06-14 DIAGNOSIS — T7840XA Allergy, unspecified, initial encounter: Secondary | ICD-10-CM

## 2022-06-14 MED ORDER — BD PEN NEEDLE NANO 2ND GEN 32G X 4 MM MISC: 4 refills | Status: DC

## 2022-06-14 MED ORDER — CETIRIZINE HCL 10 MG PO TABS: 10.0000 mg | ORAL_TABLET | Freq: Every day | ORAL | 0 refills | Status: DC

## 2022-06-14 MED ORDER — FLUTICASONE PROPIONATE 50 MCG/ACT NA SUSP: 1.0000 | Freq: Every day | NASAL | 0 refills | Status: DC | PRN

## 2022-06-14 NOTE — Progress Notes (Signed)
     This is a telephone encounter between [name] and [name] on 06/14/2022 for Cough. The visit was conducted with the patient located at home and [name] at [facility]. The patient's identity was confirmed using their DOB and current address. The patient has consented to being evaluated through a telephone encounter and understands the associated risks (an examination cannot be done and the patient may need to come in for an appointment) / benefits (allows the patient to remain at home)   CC: Cough (No fever, no aches, just a bad cough, sinus congestion started Saturday. )  HPI:Ms.Stephanie Hammond is a 71 y.o. female who presents for evaluation of cough. For the details of today's visit, please refer to the assessment and plan.   Past Medical History:  Diagnosis Date   Anemia    Blood transfusion 2009   after GI bleed   CAD (coronary artery disease)    Diabetes mellitus    2   Diverticulitis    Diverticulosis    Fear of local anesthetic    01-02-2019, patient states with last surgery in 2016 , she refused to have epidural before she was sedated d/t to extreme aversion to needles near her back     GERD (gastroesophageal reflux disease)    Gout    Headache    occ if sugar is  too hi or low    Heart murmur    "i was told this years and years and years ago "   History of GI diverticular bleed 08/2004   Hypertension    IBS (irritable bowel syndrome)    Osteoarthritis    Tachycardia    "it stays like that for years between 119 and 121 and it doesnt bother me  none "     Review of Systems:    Review of Systems  Constitutional:  Negative for chills and fever.  Respiratory:  Positive for cough. Negative for sputum production and shortness of breath.        Assessment & Plan:   Allergic rhinitis Patient is experiencing rhinorrhea and cough.  Symptoms began 4 days ago.  The cough is non-productive, without wheezing, dyspnea or hemoptysis and is aggravated by  nothing No other associated symptom .Patient does not have new pets. Patient does not have a history of asthma. Patient does not have a history of environmental allergens. Patient was around a friend 2 weeks ago who had a cough. Friend was tested for Flu and Covid it was negative Patient does have a history of smoking and continues to smoke 1/2 ppd to 1 ppd.   Assessment/Plan: Allergic Rhinitis. No history of allergies and odd to develop allergy at this age. However no infectoius symtpoms. Will prescribie glucocorticoid nasal spray and antihistamine. Patient will follow up if she develops fever or productive cough.  - cetirizine (ZYRTEC) 10 MG tablet; Take 1 tablet (10 mg total) by mouth daily.  Dispense: 30 tablet; Refill: 0 - fluticasone (FLONASE) 50 MCG/ACT nasal spray; Place 1 spray into both nostrils daily as needed for allergies or rhinitis.  Dispense: 15.8 mL; Refill: 0       Time:   Today, I have spent 15 minutes reviewing the chart, including problem list, medications, and with the patient with telehealth technology discussing the above problems.  [name], MD

## 2022-06-14 NOTE — Assessment & Plan Note (Addendum)
Patient is experiencing rhinorrhea and cough.  Symptoms began 4 days ago.  The cough is non-productive, without wheezing, dyspnea or hemoptysis and is aggravated by nothing No other associated symptom .Patient does not have new pets. Patient does not have a history of asthma. Patient does not have a history of environmental allergens. Patient was around a friend 2 weeks ago who had a cough. Friend was tested for Flu and Covid it was negative Patient does have a history of smoking and continues to smoke 1/2 ppd to 1 ppd.   Assessment/Plan: Allergic Rhinitis. No history of allergies and odd to develop allergy at this age. However no infectoius symtpoms. Will prescribie glucocorticoid nasal spray and antihistamine. Patient will follow up if she develops fever or productive cough.  - cetirizine (ZYRTEC) 10 MG tablet; Take 1 tablet (10 mg total) by mouth daily.  Dispense: 30 tablet; Refill: 0 - fluticasone (FLONASE) 50 MCG/ACT nasal spray; Place 1 spray into both nostrils daily as needed for allergies or rhinitis.  Dispense: 15.8 mL; Refill: 0

## 2022-06-19 ENCOUNTER — Telehealth (INDEPENDENT_AMBULATORY_CARE_PROVIDER_SITE_OTHER): Payer: Medicare HMO | Admitting: Internal Medicine

## 2022-06-19 DIAGNOSIS — J069 Acute upper respiratory infection, unspecified: Secondary | ICD-10-CM

## 2022-06-19 MED ORDER — AZITHROMYCIN 250 MG PO TABS: ORAL_TABLET | ORAL | 0 refills | Status: DC

## 2022-06-19 NOTE — Assessment & Plan Note (Addendum)
Upper respiratory tract infection, unspecified type. Will send antibiotic for upper respiratory tract infection and patient will make in person visit if not improving.  Noted to be allergic to penicillins.   - azithromycin (ZITHROMAX Z-PAK) 250 MG tablet; Take 2 tablets (500 mg) PO today, then 1 tablet (250 mg) PO daily x4 days.  Dispense: 6 tablet; Refill: 0

## 2022-06-19 NOTE — Telephone Encounter (Signed)
Patient called in regard to tele visit on 11/8  Meds sent in not helping, patient states she is not having allergy issues.  Would like to have antibiotic sent in.  Patient wants a call back.

## 2022-06-19 NOTE — Telephone Encounter (Signed)
       This is a telephone encounter between Stephanie Hammond and Stephanie Hammond on 06/19/2022 for cough and congestion. The visit was conducted with the patient located at home and Stephanie Hammond at [facility]. The patient's identity was confirmed using their DOB and current address. The patient has consented to being evaluated through a telephone encounter and understands the associated risks (an examination cannot be done and the patient may need to come in for an appointment) / benefits (allows the patient to remain at home, decreasing exposure to coronavirus).    CC: Congestion and cough  HPI:Ms.Stephanie Hammond is a 71 y.o. female who presents for evaluation of cough and congestion. Evaluated on 11/08 and suspected patient has allergic rhinitis. Reports not improving over the weekend. She tested for Covid at home and it was negative. Taking over counter medications, but continues to have cough and congestion. Offered in person visit and flu test, patient does not feel well enough to come for appointment. The office is 20-25 minutes from her home. No fever.   Past Medical History:  Diagnosis Date   Anemia    Blood transfusion 2009   after GI bleed   CAD (coronary artery disease)    Diabetes mellitus    2   Diverticulitis    Diverticulosis    Fear of local anesthetic    01-02-2019, patient states with last surgery in 2016 , she refused to have epidural before she was sedated d/t to extreme aversion to needles near her back     GERD (gastroesophageal reflux disease)    Gout    Headache    occ if sugar is  too hi or low    Heart murmur    "i was told this years and years and years ago "   History of GI diverticular bleed 08/2004   Hypertension    IBS (irritable bowel syndrome)    Osteoarthritis    Tachycardia    "it stays like that for years between 119 and 121 and it doesnt bother me  none "       Assessment & Plan:   URI (upper respiratory infection)  Upper respiratory tract  infection, unspecified type. Will send antibiotic for upper respiratory tract infection and patient will make in person visit if not improving.  Noted to be allergic to penicillins.   - azithromycin (ZITHROMAX Z-PAK) 250 MG tablet; Take 2 tablets (500 mg) PO today, then 1 tablet (250 mg) PO daily x4 days.  Dispense: 6 tablet; Refill: 0      Time:   Today, I have spent 12 minutes reviewing the chart, including problem list, medications, and with the patient with telehealth technology discussing the above problems.  Stephanie Dy, MD

## 2022-06-23 ENCOUNTER — Telehealth: Payer: Self-pay | Admitting: Internal Medicine

## 2022-06-23 NOTE — Telephone Encounter (Signed)
Pt called stating she is not happy with the Pain Management she was referred to. She is wanting to know if she can please be referred to [place]  FX# [phone]

## 2022-07-04 ENCOUNTER — Other Ambulatory Visit: Payer: Self-pay | Admitting: Internal Medicine

## 2022-07-04 DIAGNOSIS — E1140 Type 2 diabetes mellitus with diabetic neuropathy, unspecified: Secondary | ICD-10-CM

## 2022-07-05 ENCOUNTER — Other Ambulatory Visit: Payer: Self-pay

## 2022-07-05 MED ORDER — BASAGLAR KWIKPEN 100 UNIT/ML ~~LOC~~ SOPN: 25.0000 [IU] | PEN_INJECTOR | Freq: Every day | SUBCUTANEOUS | 3 refills | Status: DC

## 2022-07-21 ENCOUNTER — Ambulatory Visit (HOSPITAL_COMMUNITY): Payer: Medicare HMO

## 2022-07-24 ENCOUNTER — Telehealth: Payer: Self-pay | Admitting: Internal Medicine

## 2022-07-24 NOTE — Telephone Encounter (Signed)
Patient needs another referral from Dr Merlene Laughter office to [facility] and they do not have no referrals from office.

## 2022-08-30 ENCOUNTER — Ambulatory Visit (HOSPITAL_COMMUNITY): Payer: Medicare HMO

## 2022-08-31 ENCOUNTER — Other Ambulatory Visit: Payer: Self-pay | Admitting: Internal Medicine

## 2022-08-31 DIAGNOSIS — I10 Essential (primary) hypertension: Secondary | ICD-10-CM

## 2022-09-01 ENCOUNTER — Ambulatory Visit: Payer: Medicare HMO | Admitting: Internal Medicine

## 2022-09-01 DIAGNOSIS — Z794 Long term (current) use of insulin: Secondary | ICD-10-CM | POA: Diagnosis not present

## 2022-09-01 DIAGNOSIS — H524 Presbyopia: Secondary | ICD-10-CM | POA: Diagnosis not present

## 2022-09-01 DIAGNOSIS — H5213 Myopia, bilateral: Secondary | ICD-10-CM | POA: Diagnosis not present

## 2022-09-01 DIAGNOSIS — E119 Type 2 diabetes mellitus without complications: Secondary | ICD-10-CM | POA: Diagnosis not present

## 2022-09-15 ENCOUNTER — Other Ambulatory Visit: Payer: Self-pay | Admitting: Family Medicine

## 2022-09-15 DIAGNOSIS — E559 Vitamin D deficiency, unspecified: Secondary | ICD-10-CM | POA: Diagnosis not present

## 2022-09-15 DIAGNOSIS — M129 Arthropathy, unspecified: Secondary | ICD-10-CM | POA: Diagnosis not present

## 2022-09-15 DIAGNOSIS — Z6835 Body mass index (BMI) 35.0-35.9, adult: Secondary | ICD-10-CM | POA: Diagnosis not present

## 2022-09-15 DIAGNOSIS — G894 Chronic pain syndrome: Secondary | ICD-10-CM | POA: Diagnosis not present

## 2022-09-15 DIAGNOSIS — G8929 Other chronic pain: Secondary | ICD-10-CM | POA: Diagnosis not present

## 2022-09-15 DIAGNOSIS — M545 Low back pain, unspecified: Secondary | ICD-10-CM | POA: Diagnosis not present

## 2022-09-15 DIAGNOSIS — Z79899 Other long term (current) drug therapy: Secondary | ICD-10-CM | POA: Diagnosis not present

## 2022-09-18 ENCOUNTER — Encounter: Payer: Self-pay | Admitting: Internal Medicine

## 2022-09-18 ENCOUNTER — Ambulatory Visit (INDEPENDENT_AMBULATORY_CARE_PROVIDER_SITE_OTHER): Payer: Medicare HMO | Admitting: Internal Medicine

## 2022-09-18 VITALS — BP 119/70 | HR 112 | Ht 63.0 in | Wt 198.2 lb

## 2022-09-18 DIAGNOSIS — F5101 Primary insomnia: Secondary | ICD-10-CM

## 2022-09-18 DIAGNOSIS — G5601 Carpal tunnel syndrome, right upper limb: Secondary | ICD-10-CM

## 2022-09-18 DIAGNOSIS — E1140 Type 2 diabetes mellitus with diabetic neuropathy, unspecified: Secondary | ICD-10-CM

## 2022-09-18 DIAGNOSIS — I10 Essential (primary) hypertension: Secondary | ICD-10-CM | POA: Diagnosis not present

## 2022-09-18 DIAGNOSIS — R69 Illness, unspecified: Secondary | ICD-10-CM | POA: Diagnosis not present

## 2022-09-18 DIAGNOSIS — Z794 Long term (current) use of insulin: Secondary | ICD-10-CM | POA: Diagnosis not present

## 2022-09-18 LAB — POCT GLYCOSYLATED HEMOGLOBIN (HGB A1C): HbA1c, POC (controlled diabetic range): 5.6 % (ref 0.0–7.0)

## 2022-09-18 MED ORDER — OZEMPIC (2 MG/DOSE) 8 MG/3ML ~~LOC~~ SOPN: 2.0000 mg | PEN_INJECTOR | SUBCUTANEOUS | 3 refills | Status: DC

## 2022-09-18 MED ORDER — TRAZODONE HCL 50 MG PO TABS: 25.0000 mg | ORAL_TABLET | Freq: Every evening | ORAL | 3 refills | Status: DC | PRN

## 2022-09-18 NOTE — Patient Instructions (Addendum)
Please wear a wrist brace at nighttime.    Please continue taking medications as prescribed.  Please continue to follow low carb diet and ambulate as tolerated.  Please consider getting Shingrix and Tdap vaccines at local pharmacy.

## 2022-09-18 NOTE — Assessment & Plan Note (Addendum)
Lab Results  Component Value Date   HGBA1C 5.6 09/18/2022   Well-controlled On Basaglar 15 U qHS On Ozempic 2 mg On Metformin 1000 mg QD  Had Dexcom, but did not like it, but wants to try it again Used to follow up with Endocrinologist in [place] On ARB and statin  Was on Cymbalta for neuropathy, but has stopped taking it On Percocet for chronic back pain, followed by pain clinic

## 2022-09-18 NOTE — Progress Notes (Signed)
Established Patient Office Visit  Subjective:  Patient ID: Stephanie Hammond, female    DOB: October 29, 1950  Age: 72 y.o. MRN: [id number]  CC:  Chief Complaint  Patient presents with   Hand Pain    Right hand is painful and tingling all the time, numbness as well. Left ear feels full of wax.    HPI Stephanie Hammond is a 72 y.o. female with past medical history of HTN, HLD, type 2 DM, diverticulitis, s/p partial colectomy, chronic pain syndrome, gout, obesity and tobacco abuse who presents for f/u of her chronic medical conditions.  She complains of right hand pain, numbness and tingling, especially worse at nighttime.  Denies any recent injury.  She has tried shaking the hands, putting warm water on it and topical arthritic creams without any relief.  She also reports insomnia.  She has difficulty maintaining sleep and wakes up at 3 AM at times.  She also reports headache, which is generalized, dull and nonradiating.  Denies any dizziness or visual disturbance currently.  HTN: BP is well-controlled. Takes medications regularly. Patient denies headache, dizziness, chest pain, dyspnea or palpitations.  Type II DM: Her HbA1c was 5.0 in 05/23. She has tolerated Ozempic well. She takes Basaglar 15 units according to blood glucose at nighttime. She denies any episode of hypoglycemia recently. She has tried CGM in the past, but did not like it.    Past Medical History:  Diagnosis Date   Anemia    Blood transfusion 2009   after GI bleed   CAD (coronary artery disease)    Diabetes mellitus    2   Diverticulitis    Diverticulosis    Fear of local anesthetic    01-02-2019, patient states with last surgery in 2016 , she refused to have epidural before she was sedated d/t to extreme aversion to needles near her back     GERD (gastroesophageal reflux disease)    Gout    Headache    occ if sugar is  too hi or low    Heart murmur    "i was told this years and years and years ago "   History  of GI diverticular bleed 08/2004   Hypertension    IBS (irritable bowel syndrome)    Osteoarthritis    Tachycardia    "it stays like that for years between 119 and 121 and it doesnt bother me  none "     Past Surgical History:  Procedure Laterality Date   CHOLECYSTECTOMY  2003   COLON SURGERY     COLONOSCOPY  2000   Dr. Russella Dar: marked diverticulosis, difficult procedure due to adhesions, polyps benign   COLONOSCOPY  2006   Dr. Marina Goodell: severe pandiverticulosis   COLONOSCOPY N/A 02/03/2013   SLF: 4 COLORECTAL POLYPS REMOVED/Moderate diverticulosis throughout the entire examined colon/Small internal hemorrhoids   COLONOSCOPY WITH PROPOFOL N/A 04/29/2019   Procedure: COLONOSCOPY WITH PROPOFOL;  Surgeon: West Bali, MD;  Location: [facility];  Service: Endoscopy;  Laterality: N/A;  8:30am   JOINT REPLACEMENT Right 2009   Knee Replacement Right    POLYPECTOMY  04/29/2019   Procedure: POLYPECTOMY;  Surgeon: West Bali, MD;  Location: [facility];  Service: Endoscopy;;  colon   PTCA     over 10 years ago , unsure of exact year , does recall that no stents were placed    REPLACEMENT TOTAL KNEE     right    TOTAL KNEE ARTHROPLASTY Left 03/12/2015  Procedure: LEFT TOTAL KNEE REPLACEMENT;  Surgeon: Vickki Hearing, MD;  Location: [facility];  Service: Orthopedics;  Laterality: Left;   TOTAL KNEE REVISION Left 01/09/2019   Procedure: TOTAL KNEE REVISION;  Surgeon: Samson Frederic, MD;  Location: [facility];  Service: Orthopedics;  Laterality: Left;   VAGINAL HYSTERECTOMY      Family History  Problem Relation Age of Onset   Colon polyps Sister    Cancer Mother        lung    Diabetes Mother    Cancer Father        stomach    Stomach cancer Father        questionable   Colon cancer Neg Hx     Social History   Socioeconomic History   Marital status: Married    Spouse name: Not on file   Number of children: 2   Years of education: Not on file   Highest education level: Not on  file  Occupational History   Not on file  Tobacco Use   Smoking status: Every Day    Packs/day: 1.00    Years: 35.00    Total pack years: 35.00    Types: Cigarettes    Passive exposure: Current   Smokeless tobacco: Never  Vaping Use   Vaping Use: Never used  Substance and Sexual Activity   Alcohol use: No   Drug use: No   Sexual activity: Not Currently    Birth control/protection: Surgical    Comment: hyst  Other Topics Concern   Not on file  Social History Narrative   Husband passed away lives alone    Social Determinants of Health   Financial Resource Strain: Low Risk  (03/03/2022)   Overall Financial Resource Strain (CARDIA)    Difficulty of Paying Living Expenses: Not hard at all  Food Insecurity: No Food Insecurity (03/03/2022)   Hunger Vital Sign    Worried About Running Out of Food in the Last Year: Never true    Ran Out of Food in the Last Year: Never true  Transportation Needs: No Transportation Needs (03/03/2022)   PRAPARE - Administrator, Civil Service (Medical): No    Lack of Transportation (Non-Medical): No  Physical Activity: Inactive (03/03/2022)   Exercise Vital Sign    Days of Exercise per Week: 0 days    Minutes of Exercise per Session: 0 min  Stress: No Stress Concern Present (03/03/2022)   [employer] of Occupational Health - Occupational Stress Questionnaire    Feeling of Stress : Not at all  Social Connections: Moderately Isolated (03/03/2022)   Social Connection and Isolation Panel [NHANES]    Frequency of Communication with Friends and Family: More than three times a week    Frequency of Social Gatherings with Friends and Family: More than three times a week    Attends Religious Services: More than 4 times per year    Active Member of [employer] or Organizations: No    Attends Banker Meetings: Never    Marital Status: Widowed  Intimate Partner Violence: Not At Risk (03/03/2022)   Humiliation, Afraid, Rape, and Kick  questionnaire    Fear of Current or Ex-Partner: No    Emotionally Abused: No    Physically Abused: No    Sexually Abused: No    Outpatient Medications Prior to Visit  Medication Sig Dispense Refill   amLODipine (NORVASC) 10 MG tablet TAKE ONE TABLET BY MOUTH ONCE DAILY 90 tablet 1  atorvastatin (LIPITOR) 20 MG tablet TAKE ONE TABLET BY MOUTH EVERY DAY 90 tablet 1   blood glucose meter kit and supplies Dispense based on patient and insurance preference. Use up to four times daily as directed. (FOR ICD-10 E10.9, E11.9). 1 each 0   BUTRANS 15 MCG/HR Place 1 patch onto the skin once a week.     cetirizine (ZYRTEC) 10 MG tablet Take 1 tablet (10 mg total) by mouth daily. 30 tablet 0   fluticasone (FLONASE) 50 MCG/ACT nasal spray Place 1 spray into both nostrils daily as needed for allergies or rhinitis. 15.8 mL 0   Glucose Blood (BLOOD GLUCOSE TEST STRIPS) STRP Please dispense based on patient and insurance preference. Use as directed to monitor FSBS 3x daily. Dx: E11.9  For testing 4 times daily 100 strip 11   Insulin Glargine (BASAGLAR KWIKPEN) 100 UNIT/ML Inject 25 Units into the skin at bedtime. 15 mL 3   Insulin Pen Needle (BD PEN NEEDLE NANO 2ND GEN) 32G X 4 MM MISC Use to inject insulin daily dx e11.65 100 each 4   Lancets MISC Please dispense based on patient and insurance preference. Use as directed to monitor FSBS 3x daily. Dx: E11.9  For testing 4 times daily 100 each 11   losartan (COZAAR) 100 MG tablet TAKE ONE TABLET BY MOUTH DAILY 90 tablet 1   metFORMIN (GLUCOPHAGE-XR) 500 MG 24 hr tablet TAKE 2 TABLETS(1000 MG) BY MOUTH DAILY WITH BREAKFAST 180 tablet 2   NOVOLOG FLEXPEN 100 UNIT/ML FlexPen Sliding Scale: Blood Sugar of 150-200 = TWO units, 201-250 = 4units, 251-300 = 6units, 301-350 = 8units, 351-400 = 10units 15 mL 10   omeprazole (PRILOSEC) 20 MG capsule TAKE ONE CAPSULE BY MOUTH ONCE DAILY 30 MINUTES BEFORE morning MEAL 30 capsule 11   oxyCODONE-acetaminophen (PERCOCET)  10-325 MG tablet Take 1 tablet by mouth 2 (two) times daily.     Vitamin D, Ergocalciferol, (DRISDOL) 1.25 MG (50000 UNIT) CAPS capsule Take 50,000 Units by mouth once a week.     azithromycin (ZITHROMAX Z-PAK) 250 MG tablet Take 2 tablets (500 mg) PO today, then 1 tablet (250 mg) PO daily x4 days. 6 tablet 0   DULoxetine (CYMBALTA) 60 MG capsule Take 60 mg by mouth daily.     OZEMPIC, 2 MG/DOSE, 8 MG/3ML SOPN Inject 2 mg as directed once a week. 3 mL 3   No facility-administered medications prior to visit.    Allergies  Allergen Reactions   Ace Inhibitors Cough   Bentyl [Dicyclomine Hcl] Other (See Comments)    Blurry vision   Penicillins Itching and Swelling    Whelps Did it involve swelling of the face/tongue/throat, SOB, or low BP? No Did it involve sudden or severe rash/hives, skin peeling, or any reaction on the inside of your mouth or nose? No Did you need to seek medical attention at a hospital or doctor's office? No When did it last happen?      20 years If all above answers are "NO", may proceed with cephalosporin use.     ROS Review of Systems  Constitutional:  Negative for chills and fever.  HENT:  Negative for congestion, sinus pressure, sinus pain and sore throat.   Eyes:  Negative for pain and discharge.  Respiratory:  Negative for cough and shortness of breath.   Cardiovascular:  Negative for chest pain and palpitations.  Gastrointestinal:  Negative for abdominal pain, constipation, diarrhea, nausea and vomiting.  Endocrine: Negative for polydipsia and polyuria.  Genitourinary:  Negative for dysuria and hematuria.  Musculoskeletal:  Positive for arthralgias and back pain. Negative for neck pain and neck stiffness.  Skin:  Negative for rash.  Neurological:  Positive for numbness and headaches. Negative for dizziness and weakness.  Psychiatric/Behavioral:  Positive for sleep disturbance. Negative for agitation and behavioral problems.       Objective:     Physical Exam Vitals reviewed.  Constitutional:      General: She is not in acute distress.    Appearance: She is obese. She is not diaphoretic.  HENT:     Head: Normocephalic and atraumatic.     Nose: Nose normal.     Mouth/Throat:     Mouth: Mucous membranes are moist.  Eyes:     General: No scleral icterus.    Extraocular Movements: Extraocular movements intact.  Neck:     Vascular: No carotid bruit.  Cardiovascular:     Rate and Rhythm: Normal rate and regular rhythm.     Pulses: Normal pulses.     Heart sounds: Normal heart sounds. No murmur heard. Pulmonary:     Breath sounds: Normal breath sounds. No wheezing or rales.  Musculoskeletal:     Cervical back: Neck supple. No tenderness.     Right lower leg: No edema.     Left lower leg: No edema.     Comments: Tinel and Phalen sign positive on right side  Skin:    General: Skin is warm.     Findings: No rash.  Neurological:     General: No focal deficit present.     Mental Status: She is alert and oriented to person, place, and time.     Sensory: No sensory deficit.     Motor: No weakness.  Psychiatric:        Mood and Affect: Mood normal.        Behavior: Behavior normal.     BP 119/70 (BP Location: Right Arm, Patient Position: Sitting, Cuff Size: Large)   Pulse (!) 112   Ht 5\' 3"  (1.6 m)   Wt 198 lb 3.2 oz (89.9 kg)   SpO2 93%   BMI 35.11 kg/m  Wt Readings from Last 3 Encounters:  09/18/22 198 lb 3.2 oz (89.9 kg)  06/02/22 203 lb 3.2 oz (92.2 kg)  05/01/22 208 lb (94.3 kg)    Lab Results  Component Value Date   TSH 0.987 05/01/2022   Lab Results  Component Value Date   WBC 5.5 05/01/2022   HGB 11.8 05/01/2022   HCT 34.4 05/01/2022   MCV 94 05/01/2022   PLT 315 05/01/2022   Lab Results  Component Value Date   NA 141 05/01/2022   K 4.8 05/01/2022   CO2 21 05/01/2022   GLUCOSE 169 (H) 05/01/2022   BUN 17 05/01/2022   CREATININE 0.89 05/01/2022   BILITOT 0.2 05/01/2022   ALKPHOS 126 (H)  05/01/2022   AST 13 05/01/2022   ALT 12 05/01/2022   PROT 6.6 05/01/2022   ALBUMIN 4.1 05/01/2022   CALCIUM 9.3 05/01/2022   ANIONGAP 12 04/25/2019   EGFR 70 05/01/2022   Lab Results  Component Value Date   CHOL 97 (L) 05/01/2022   Lab Results  Component Value Date   HDL 43 05/01/2022   Lab Results  Component Value Date   LDLCALC 39 05/01/2022   Lab Results  Component Value Date   TRIG 67 05/01/2022   Lab Results  Component Value Date   CHOLHDL 2.3 05/01/2022  Lab Results  Component Value Date   HGBA1C 5.6 09/18/2022      Assessment & Plan:   Problem List Items Addressed This Visit       Cardiovascular and Mediastinum   Essential hypertension, benign    BP Readings from Last 1 Encounters:  09/18/22 119/70  Well-controlled with Amlodipine and Losartan Counseled for compliance with the medications Advised DASH diet and moderate exercise/walking, at least 150 mins/week        Endocrine   Type 2 diabetes mellitus with diabetic neuropathy (HCC) - Primary    Lab Results  Component Value Date   HGBA1C 5.6 09/18/2022  Well-controlled On Basaglar 15 U qHS On Ozempic 2 mg On Metformin 1000 mg QD  Had Dexcom, but did not like it, but wants to try it again Used to follow up with Endocrinologist in [place] On ARB and statin  Was on Cymbalta for neuropathy, but has stopped taking it On Percocet for chronic back pain, followed by pain clinic      Relevant Medications   Semaglutide, 2 MG/DOSE, (OZEMPIC, 2 MG/DOSE,) 8 MG/3ML SOPN   Other Relevant Orders   POCT glycosylated hemoglobin (Hb A1C) (Completed)     Nervous and Auditory   Carpal tunnel syndrome of right wrist    Right hand numbness and tingling left likely due to carpal tunnel syndrome Advised to wear wrist brace at nighttime If persistent, will refer to Orthopedic surgeon      Relevant Medications   traZODone (DESYREL) 50 MG tablet     Other   Morbid obesity (HCC)    BMI Readings from  Last 3 Encounters:  09/18/22 35.11 kg/m  06/02/22 36.00 kg/m  05/01/22 36.85 kg/m  Associated with HTN, type 2 DM and HLD Diet modification and ambulation as tolerated On GLP-1 agonist for type II DM      Relevant Medications   Semaglutide, 2 MG/DOSE, (OZEMPIC, 2 MG/DOSE,) 8 MG/3ML SOPN   Primary insomnia    She sleeps only 4 to 5 hours a day Chronic insomnia can lead to fatigue and headache as well She lives alone, does not report apneic episode, but is not clear about snoring Offered home sleep study for evaluation of insomnia and chronic fatigue, but she denies Started Trazodone PRN for now      Relevant Medications   traZODone (DESYREL) 50 MG tablet   Meds ordered this encounter  Medications   traZODone (DESYREL) 50 MG tablet    Sig: Take 0.5-1 tablets (25-50 mg total) by mouth at bedtime as needed for sleep.    Dispense:  30 tablet    Refill:  3   Semaglutide, 2 MG/DOSE, (OZEMPIC, 2 MG/DOSE,) 8 MG/3ML SOPN    Sig: Inject 2 mg into the skin every 7 (seven) days.    Dispense:  3 mL    Refill:  3    Follow-up: Return in about 4 months (around 01/17/2023).    Anabel Halon, MD

## 2022-09-18 NOTE — Assessment & Plan Note (Signed)
BP Readings from Last 1 Encounters:  09/18/22 119/70   Well-controlled with Amlodipine and Losartan Counseled for compliance with the medications Advised DASH diet and moderate exercise/walking, at least 150 mins/week

## 2022-09-18 NOTE — Assessment & Plan Note (Signed)
BMI Readings from Last 3 Encounters:  09/18/22 35.11 kg/m  06/02/22 36.00 kg/m  05/01/22 36.85 kg/m   Associated with HTN, type 2 DM and HLD Diet modification and ambulation as tolerated On GLP-1 agonist for type II DM

## 2022-09-18 NOTE — Assessment & Plan Note (Signed)
She sleeps only 4 to 5 hours a day Chronic insomnia can lead to fatigue and headache as well She lives alone, does not report apneic episode, but is not clear about snoring Offered home sleep study for evaluation of insomnia and chronic fatigue, but she denies Started Trazodone PRN for now

## 2022-09-18 NOTE — Assessment & Plan Note (Signed)
Right hand numbness and tingling left likely due to carpal tunnel syndrome Advised to wear wrist brace at nighttime If persistent, will refer to Orthopedic surgeon

## 2022-09-21 LAB — MICROALBUMIN / CREATININE URINE RATIO
Creatinine, Urine: 180.6 mg/dL
Microalb/Creat Ratio: 8 mg/g creat (ref 0–29)
Microalbumin, Urine: 14.4 ug/mL

## 2022-09-22 DIAGNOSIS — H2512 Age-related nuclear cataract, left eye: Secondary | ICD-10-CM | POA: Diagnosis not present

## 2022-09-22 DIAGNOSIS — Z01818 Encounter for other preprocedural examination: Secondary | ICD-10-CM | POA: Diagnosis not present

## 2022-09-22 DIAGNOSIS — H2511 Age-related nuclear cataract, right eye: Secondary | ICD-10-CM | POA: Diagnosis not present

## 2022-09-29 DIAGNOSIS — Z6834 Body mass index (BMI) 34.0-34.9, adult: Secondary | ICD-10-CM | POA: Diagnosis not present

## 2022-09-29 DIAGNOSIS — G894 Chronic pain syndrome: Secondary | ICD-10-CM | POA: Diagnosis not present

## 2022-09-29 DIAGNOSIS — M545 Low back pain, unspecified: Secondary | ICD-10-CM | POA: Diagnosis not present

## 2022-09-29 DIAGNOSIS — Z79899 Other long term (current) drug therapy: Secondary | ICD-10-CM | POA: Diagnosis not present

## 2022-09-29 DIAGNOSIS — G8929 Other chronic pain: Secondary | ICD-10-CM | POA: Diagnosis not present

## 2022-10-13 ENCOUNTER — Ambulatory Visit (HOSPITAL_COMMUNITY): Payer: Medicare HMO

## 2022-10-27 DIAGNOSIS — G8929 Other chronic pain: Secondary | ICD-10-CM | POA: Diagnosis not present

## 2022-10-27 DIAGNOSIS — Z6835 Body mass index (BMI) 35.0-35.9, adult: Secondary | ICD-10-CM | POA: Diagnosis not present

## 2022-10-27 DIAGNOSIS — Z79899 Other long term (current) drug therapy: Secondary | ICD-10-CM | POA: Diagnosis not present

## 2022-10-27 DIAGNOSIS — G894 Chronic pain syndrome: Secondary | ICD-10-CM | POA: Diagnosis not present

## 2022-10-27 DIAGNOSIS — M545 Low back pain, unspecified: Secondary | ICD-10-CM | POA: Diagnosis not present

## 2022-11-07 ENCOUNTER — Other Ambulatory Visit: Payer: Self-pay | Admitting: Internal Medicine

## 2022-11-07 DIAGNOSIS — I10 Essential (primary) hypertension: Secondary | ICD-10-CM

## 2022-11-17 DIAGNOSIS — H2512 Age-related nuclear cataract, left eye: Secondary | ICD-10-CM | POA: Diagnosis not present

## 2022-11-17 DIAGNOSIS — H269 Unspecified cataract: Secondary | ICD-10-CM | POA: Diagnosis not present

## 2022-11-24 ENCOUNTER — Other Ambulatory Visit: Payer: Self-pay | Admitting: Internal Medicine

## 2022-11-24 DIAGNOSIS — E785 Hyperlipidemia, unspecified: Secondary | ICD-10-CM

## 2022-11-24 DIAGNOSIS — E1140 Type 2 diabetes mellitus with diabetic neuropathy, unspecified: Secondary | ICD-10-CM

## 2022-11-25 DIAGNOSIS — G894 Chronic pain syndrome: Secondary | ICD-10-CM | POA: Diagnosis not present

## 2022-11-25 DIAGNOSIS — Z6835 Body mass index (BMI) 35.0-35.9, adult: Secondary | ICD-10-CM | POA: Diagnosis not present

## 2022-11-25 DIAGNOSIS — M545 Low back pain, unspecified: Secondary | ICD-10-CM | POA: Diagnosis not present

## 2022-11-25 DIAGNOSIS — G8929 Other chronic pain: Secondary | ICD-10-CM | POA: Diagnosis not present

## 2022-11-27 ENCOUNTER — Other Ambulatory Visit: Payer: Self-pay

## 2022-11-27 ENCOUNTER — Telehealth: Payer: Self-pay | Admitting: Internal Medicine

## 2022-11-27 MED ORDER — LANCETS MISC: 11 refills | Status: DC

## 2022-11-27 NOTE — Telephone Encounter (Signed)
Pt is requesting script for VERIO glucose meter   [facility] [place], [place] [address], [place] [address] Phone: [phone]  Fax: [phone] DEA #: -- DAW Reason: --

## 2022-11-27 NOTE — Telephone Encounter (Signed)
Sent to pharmacy 

## 2022-12-05 ENCOUNTER — Other Ambulatory Visit: Payer: Self-pay

## 2022-12-05 MED ORDER — BLOOD GLUCOSE TEST VI STRP: ORAL_STRIP | 11 refills | Status: DC

## 2022-12-05 MED ORDER — LANCETS MISC: 11 refills | Status: DC

## 2022-12-05 NOTE — Telephone Encounter (Signed)
Patient asked for nurse to return her call and let her know what name and type of machine that will be sent into her pharmacy

## 2022-12-05 NOTE — Telephone Encounter (Signed)
Needs to send this to [facility],

## 2022-12-05 NOTE — Telephone Encounter (Signed)
Spoke to patient

## 2022-12-06 ENCOUNTER — Telehealth: Payer: Self-pay | Admitting: Internal Medicine

## 2022-12-06 NOTE — Telephone Encounter (Signed)
Patient called said the wrong meter was called in.  Meter for diabetes call patient to verify correct one. Patient saying [employer]  Pharmacy: [facility] in [place]

## 2022-12-07 NOTE — Telephone Encounter (Signed)
Spoke with patient.

## 2022-12-08 DIAGNOSIS — H2511 Age-related nuclear cataract, right eye: Secondary | ICD-10-CM | POA: Diagnosis not present

## 2022-12-08 DIAGNOSIS — H269 Unspecified cataract: Secondary | ICD-10-CM | POA: Diagnosis not present

## 2022-12-11 ENCOUNTER — Telehealth: Payer: Self-pay | Admitting: "Endocrinology

## 2022-12-11 ENCOUNTER — Other Ambulatory Visit: Payer: Self-pay | Admitting: "Endocrinology

## 2022-12-11 DIAGNOSIS — I10 Essential (primary) hypertension: Secondary | ICD-10-CM

## 2022-12-11 NOTE — Telephone Encounter (Signed)
Pt has not been seen since Sept of 2021.  Do you want labs and if so can you put an order in?

## 2022-12-11 NOTE — Telephone Encounter (Signed)
Left patient voicemail to let her know to do labs 1 week before appt.  Mailed lab order

## 2022-12-12 ENCOUNTER — Ambulatory Visit: Payer: Medicare HMO | Admitting: Internal Medicine

## 2022-12-20 ENCOUNTER — Ambulatory Visit (HOSPITAL_COMMUNITY)
Admission: RE | Admit: 2022-12-20 | Discharge: 2022-12-20 | Disposition: A | Discharge status: Home / Self Care | Payer: Medicare HMO | Source: Ambulatory Visit | Attending: Internal Medicine | Admitting: Internal Medicine

## 2022-12-20 DIAGNOSIS — Z122 Encounter for screening for malignant neoplasm of respiratory organs: Secondary | ICD-10-CM | POA: Diagnosis not present

## 2022-12-20 DIAGNOSIS — F17210 Nicotine dependence, cigarettes, uncomplicated: Secondary | ICD-10-CM | POA: Diagnosis not present

## 2023-01-01 DIAGNOSIS — G894 Chronic pain syndrome: Secondary | ICD-10-CM | POA: Diagnosis not present

## 2023-01-01 DIAGNOSIS — I10 Essential (primary) hypertension: Secondary | ICD-10-CM | POA: Diagnosis not present

## 2023-01-01 DIAGNOSIS — Z6835 Body mass index (BMI) 35.0-35.9, adult: Secondary | ICD-10-CM | POA: Diagnosis not present

## 2023-01-01 DIAGNOSIS — Z79899 Other long term (current) drug therapy: Secondary | ICD-10-CM | POA: Diagnosis not present

## 2023-01-01 DIAGNOSIS — M545 Low back pain, unspecified: Secondary | ICD-10-CM | POA: Diagnosis not present

## 2023-01-19 ENCOUNTER — Ambulatory Visit: Payer: Medicare HMO | Admitting: Internal Medicine

## 2023-01-29 ENCOUNTER — Ambulatory Visit: Payer: Medicare HMO | Admitting: Internal Medicine

## 2023-01-29 ENCOUNTER — Encounter: Payer: Self-pay | Admitting: Internal Medicine

## 2023-01-29 ENCOUNTER — Other Ambulatory Visit: Payer: Self-pay | Admitting: Internal Medicine

## 2023-01-29 ENCOUNTER — Ambulatory Visit (INDEPENDENT_AMBULATORY_CARE_PROVIDER_SITE_OTHER): Payer: Medicare HMO | Admitting: Internal Medicine

## 2023-01-29 VITALS — BP 134/73 | HR 106 | Ht 63.0 in | Wt 208.4 lb

## 2023-01-29 DIAGNOSIS — Z0001 Encounter for general adult medical examination with abnormal findings: Secondary | ICD-10-CM | POA: Diagnosis not present

## 2023-01-29 DIAGNOSIS — Z720 Tobacco use: Secondary | ICD-10-CM | POA: Diagnosis not present

## 2023-01-29 DIAGNOSIS — I10 Essential (primary) hypertension: Secondary | ICD-10-CM | POA: Diagnosis not present

## 2023-01-29 DIAGNOSIS — E782 Mixed hyperlipidemia: Secondary | ICD-10-CM | POA: Diagnosis not present

## 2023-01-29 DIAGNOSIS — Z79899 Other long term (current) drug therapy: Secondary | ICD-10-CM | POA: Diagnosis not present

## 2023-01-29 DIAGNOSIS — E559 Vitamin D deficiency, unspecified: Secondary | ICD-10-CM | POA: Diagnosis not present

## 2023-01-29 DIAGNOSIS — Z6836 Body mass index (BMI) 36.0-36.9, adult: Secondary | ICD-10-CM | POA: Diagnosis not present

## 2023-01-29 DIAGNOSIS — R5382 Chronic fatigue, unspecified: Secondary | ICD-10-CM

## 2023-01-29 DIAGNOSIS — Z794 Long term (current) use of insulin: Secondary | ICD-10-CM

## 2023-01-29 DIAGNOSIS — E1140 Type 2 diabetes mellitus with diabetic neuropathy, unspecified: Secondary | ICD-10-CM

## 2023-01-29 DIAGNOSIS — G894 Chronic pain syndrome: Secondary | ICD-10-CM | POA: Diagnosis not present

## 2023-01-29 DIAGNOSIS — M47816 Spondylosis without myelopathy or radiculopathy, lumbar region: Secondary | ICD-10-CM | POA: Diagnosis not present

## 2023-01-29 DIAGNOSIS — M545 Low back pain, unspecified: Secondary | ICD-10-CM | POA: Diagnosis not present

## 2023-01-29 DIAGNOSIS — F5101 Primary insomnia: Secondary | ICD-10-CM | POA: Diagnosis not present

## 2023-01-29 MED ORDER — AMITRIPTYLINE HCL 10 MG PO TABS
10.0000 mg | ORAL_TABLET | Freq: Every day | ORAL | 3 refills | Status: DC
Start: 2023-01-29 — End: 2023-06-11

## 2023-01-29 MED ORDER — OZEMPIC (2 MG/DOSE) 8 MG/3ML ~~LOC~~ SOPN: 2.0000 mg | PEN_INJECTOR | SUBCUTANEOUS | 3 refills | Status: DC

## 2023-01-29 NOTE — Assessment & Plan Note (Signed)
She sleeps only 4 to 5 hours a day Chronic insomnia can lead to fatigue and headache as well She lives alone, does not report apneic episode, but is not clear about snoring Offered home sleep study for evaluation of insomnia and chronic fatigue, but she denies Added amitriptyline for insomnia, can also help with fatigue, headache and neuropathy

## 2023-01-29 NOTE — Progress Notes (Signed)
Established Patient Office Visit  Subjective:  Patient ID: Stephanie Hammond, female    DOB: 10-May-1951  Age: 72 y.o. MRN: [id number]  CC:  Chief Complaint  Patient presents with   Diabetes    Four month follow up    Annual Exam    HPI Stephanie Hammond is a 72 y.o. female with past medical history of HTN, HLD, type 2 DM, diverticulitis, s/p partial colectomy, chronic pain syndrome, gout, obesity and tobacco abuse who presents for annual physical.  HTN: BP is well-controlled. Takes medications regularly. Patient denies dizziness, chest pain, dyspnea or palpitations.  Type II DM: Her HbA1c was 5.6 in 02/24. She has tolerated Ozempic well. She takes Basaglar 15-20 units according to blood glucose at nighttime. She takes mealtime insulin once in a day, but does not follow sliding scale.  she has noticed blood glucose readings in 200s-300s recently. She denies any episode of hypoglycemia recently. She has tried CGM in the past, but did not like it.  Insomnia, chronic fatigue and headache:   She has difficulty maintaining sleep and wakes up at 3 AM at times.  She also reports headache, which is generalized, dull and nonradiating.  Denies any dizziness or visual disturbance currently.       Past Medical History:  Diagnosis Date   Anemia    Blood transfusion 2009   after GI bleed   CAD (coronary artery disease)    Diabetes mellitus    2   Diverticulitis    Diverticulosis    Fear of local anesthetic    01-02-2019, patient states with last surgery in 2016 , she refused to have epidural before she was sedated d/t to extreme aversion to needles near her back     GERD (gastroesophageal reflux disease)    Gout    Headache    occ if sugar is  too hi or low    Heart murmur    "i was told this years and years and years ago "   History of GI diverticular bleed 08/2004   Hypertension    IBS (irritable bowel syndrome)    Osteoarthritis    Tachycardia    "it stays like that for years  between 119 and 121 and it doesnt bother me  none "     Past Surgical History:  Procedure Laterality Date   CHOLECYSTECTOMY  2003   COLON SURGERY     COLONOSCOPY  2000   Dr. Russella Dar: marked diverticulosis, difficult procedure due to adhesions, polyps benign   COLONOSCOPY  2006   Dr. Marina Goodell: severe pandiverticulosis   COLONOSCOPY N/A 02/03/2013   SLF: 4 COLORECTAL POLYPS REMOVED/Moderate diverticulosis throughout the entire examined colon/Small internal hemorrhoids   COLONOSCOPY WITH PROPOFOL N/A 04/29/2019   Procedure: COLONOSCOPY WITH PROPOFOL;  Surgeon: West Bali, MD;  Location: [facility];  Service: Endoscopy;  Laterality: N/A;  8:30am   JOINT REPLACEMENT Right 2009   Knee Replacement Right    POLYPECTOMY  04/29/2019   Procedure: POLYPECTOMY;  Surgeon: West Bali, MD;  Location: [facility];  Service: Endoscopy;;  colon   PTCA     over 10 years ago , unsure of exact year , does recall that no stents were placed    REPLACEMENT TOTAL KNEE     right    TOTAL KNEE ARTHROPLASTY Left 03/12/2015   Procedure: LEFT TOTAL KNEE REPLACEMENT;  Surgeon: Vickki Hearing, MD;  Location: [facility];  Service: Orthopedics;  Laterality: Left;  TOTAL KNEE REVISION Left 01/09/2019   Procedure: TOTAL KNEE REVISION;  Surgeon: Samson Frederic, MD;  Location: [facility];  Service: Orthopedics;  Laterality: Left;   VAGINAL HYSTERECTOMY      Family History  Problem Relation Age of Onset   Colon polyps Sister    Cancer Mother        lung    Diabetes Mother    Cancer Father        stomach    Stomach cancer Father        questionable   Colon cancer Neg Hx     Social History   Socioeconomic History   Marital status: Married    Spouse name: Not on file   Number of children: 2   Years of education: Not on file   Highest education level: Not on file  Occupational History   Not on file  Tobacco Use   Smoking status: Every Day    Packs/day: 1.00    Years: 35.00    Additional pack years:  0.00    Total pack years: 35.00    Types: Cigarettes    Passive exposure: Current   Smokeless tobacco: Never  Vaping Use   Vaping Use: Never used  Substance and Sexual Activity   Alcohol use: No   Drug use: No   Sexual activity: Not Currently    Birth control/protection: Surgical    Comment: hyst  Other Topics Concern   Not on file  Social History Narrative   Husband passed away lives alone    Social Determinants of Health   Financial Resource Strain: Low Risk  (03/03/2022)   Overall Financial Resource Strain (CARDIA)    Difficulty of Paying Living Expenses: Not hard at all  Food Insecurity: No Food Insecurity (03/03/2022)   Hunger Vital Sign    Worried About Running Out of Food in the Last Year: Never true    Ran Out of Food in the Last Year: Never true  Transportation Needs: No Transportation Needs (03/03/2022)   PRAPARE - Administrator, Civil Service (Medical): No    Lack of Transportation (Non-Medical): No  Physical Activity: Inactive (03/03/2022)   Exercise Vital Sign    Days of Exercise per Week: 0 days    Minutes of Exercise per Session: 0 min  Stress: No Stress Concern Present (03/03/2022)   [employer] of Occupational Health - Occupational Stress Questionnaire    Feeling of Stress : Not at all  Social Connections: Moderately Isolated (03/03/2022)   Social Connection and Isolation Panel [NHANES]    Frequency of Communication with Friends and Family: More than three times a week    Frequency of Social Gatherings with Friends and Family: More than three times a week    Attends Religious Services: More than 4 times per year    Active Member of [employer] or Organizations: No    Attends Banker Meetings: Never    Marital Status: Widowed  Intimate Partner Violence: Not At Risk (03/03/2022)   Humiliation, Afraid, Rape, and Kick questionnaire    Fear of Current or Ex-Partner: No    Emotionally Abused: No    Physically Abused: No    Sexually  Abused: No    Outpatient Medications Prior to Visit  Medication Sig Dispense Refill   amLODipine (NORVASC) 10 MG tablet TAKE ONE TABLET BY MOUTH ONCE DAILY 90 tablet 1   atorvastatin (LIPITOR) 20 MG tablet TAKE ONE TABLET BY MOUTH EVERY DAY 90 tablet 1  blood glucose meter kit and supplies Dispense based on patient and insurance preference. Use up to four times daily as directed. (FOR ICD-10 E10.9, E11.9). 1 each 0   BUTRANS 15 MCG/HR Place 1 patch onto the skin once a week.     cetirizine (ZYRTEC) 10 MG tablet Take 1 tablet (10 mg total) by mouth daily. 30 tablet 0   fluticasone (FLONASE) 50 MCG/ACT nasal spray Place 1 spray into both nostrils daily as needed for allergies or rhinitis. 15.8 mL 0   Glucose Blood (BLOOD GLUCOSE TEST STRIPS) STRP Please dispense based on patient and insurance preference. Use as directed to monitor FSBS 3x daily. Dx: E11.9  For testing 4 times daily 100 strip 11   Insulin Glargine (BASAGLAR KWIKPEN) 100 UNIT/ML Inject 25 Units into the skin at bedtime. 15 mL 3   Insulin Pen Needle (BD PEN NEEDLE NANO U/F) 32G X 4 MM MISC USE AS DIRECTED TO INJECT INSULIN FOUR TIMES DAILY 100 each 2   Lancets MISC Please dispense based on patient and insurance preference. Use as directed to monitor FSBS 3x daily. Dx: E11.9  For testing 4 times daily 100 each 11   losartan (COZAAR) 100 MG tablet TAKE ONE TABLET BY MOUTH DAILY 90 tablet 1   metFORMIN (GLUCOPHAGE-XR) 500 MG 24 hr tablet TAKE 2 TABLETS(1000 MG) BY MOUTH DAILY WITH BREAKFAST 180 tablet 2   NOVOLOG FLEXPEN 100 UNIT/ML FlexPen Sliding Scale: Blood Sugar of 150-200 = TWO units, 201-250 = 4units, 251-300 = 6units, 301-350 = 8units, 351-400 = 10units 15 mL 10   omeprazole (PRILOSEC) 20 MG capsule TAKE ONE CAPSULE BY MOUTH ONCE DAILY 30 MINUTES BEFORE morning MEAL 30 capsule 11   oxyCODONE-acetaminophen (PERCOCET) 10-325 MG tablet Take 1 tablet by mouth 2 (two) times daily.     Vitamin D, Ergocalciferol, (DRISDOL) 1.25 MG  (50000 UNIT) CAPS capsule Take 50,000 Units by mouth once a week.     Semaglutide, 2 MG/DOSE, (OZEMPIC, 2 MG/DOSE,) 8 MG/3ML SOPN Inject 2 mg into the skin every 7 (seven) days. 3 mL 3   traZODone (DESYREL) 50 MG tablet Take 0.5-1 tablets (25-50 mg total) by mouth at bedtime as needed for sleep. 30 tablet 3   No facility-administered medications prior to visit.    Allergies  Allergen Reactions   Ace Inhibitors Cough   Bentyl [Dicyclomine Hcl] Other (See Comments)    Blurry vision   Penicillins Itching and Swelling    Whelps Did it involve swelling of the face/tongue/throat, SOB, or low BP? No Did it involve sudden or severe rash/hives, skin peeling, or any reaction on the inside of your mouth or nose? No Did you need to seek medical attention at a hospital or doctor's office? No When did it last happen?      20 years If all above answers are "NO", may proceed with cephalosporin use.     ROS Review of Systems  Constitutional:  Negative for chills and fever.  HENT:  Negative for congestion, sinus pressure, sinus pain and sore throat.   Eyes:  Negative for pain and discharge.  Respiratory:  Negative for cough and shortness of breath.   Cardiovascular:  Negative for chest pain and palpitations.  Gastrointestinal:  Negative for abdominal pain, constipation, diarrhea, nausea and vomiting.  Endocrine: Negative for polydipsia and polyuria.  Genitourinary:  Negative for dysuria and hematuria.  Musculoskeletal:  Positive for arthralgias and back pain. Negative for neck pain and neck stiffness.  Skin:  Negative for rash.  Neurological:  Positive for numbness and headaches. Negative for dizziness and weakness.  Psychiatric/Behavioral:  Positive for sleep disturbance. Negative for agitation and behavioral problems.       Objective:    Physical Exam Vitals reviewed.  Constitutional:      General: She is not in acute distress.    Appearance: She is obese. She is not diaphoretic.  HENT:      Head: Normocephalic and atraumatic.     Nose: Nose normal.     Mouth/Throat:     Mouth: Mucous membranes are moist.  Eyes:     General: No scleral icterus.    Extraocular Movements: Extraocular movements intact.  Neck:     Vascular: No carotid bruit.  Cardiovascular:     Rate and Rhythm: Normal rate and regular rhythm.     Pulses: Normal pulses.     Heart sounds: Normal heart sounds. No murmur heard. Pulmonary:     Breath sounds: Normal breath sounds. No wheezing or rales.  Musculoskeletal:     Cervical back: Neck supple. No tenderness.     Right lower leg: No edema.     Left lower leg: No edema.     Comments: Tinel and Phalen sign positive on right side  Skin:    General: Skin is warm.     Findings: No rash.  Neurological:     General: No focal deficit present.     Mental Status: She is alert and oriented to person, place, and time.     Sensory: No sensory deficit.     Motor: No weakness.  Psychiatric:        Mood and Affect: Mood normal.        Behavior: Behavior normal.     BP 134/73 (BP Location: Right Arm, Patient Position: Sitting, Cuff Size: Normal)   Pulse (!) 106   Ht 5\' 3"  (1.6 m)   Wt 208 lb 6.4 oz (94.5 kg)   SpO2 92%   BMI 36.92 kg/m  Wt Readings from Last 3 Encounters:  01/29/23 208 lb 6.4 oz (94.5 kg)  09/18/22 198 lb 3.2 oz (89.9 kg)  06/02/22 203 lb 3.2 oz (92.2 kg)    Lab Results  Component Value Date   TSH 0.987 05/01/2022   Lab Results  Component Value Date   WBC 5.5 05/01/2022   HGB 11.8 05/01/2022   HCT 34.4 05/01/2022   MCV 94 05/01/2022   PLT 315 05/01/2022   Lab Results  Component Value Date   NA 141 05/01/2022   K 4.8 05/01/2022   CO2 21 05/01/2022   GLUCOSE 169 (H) 05/01/2022   BUN 17 05/01/2022   CREATININE 0.89 05/01/2022   BILITOT 0.2 05/01/2022   ALKPHOS 126 (H) 05/01/2022   AST 13 05/01/2022   ALT 12 05/01/2022   PROT 6.6 05/01/2022   ALBUMIN 4.1 05/01/2022   CALCIUM 9.3 05/01/2022   ANIONGAP 12 04/25/2019    EGFR 70 05/01/2022   Lab Results  Component Value Date   CHOL 97 (L) 05/01/2022   Lab Results  Component Value Date   HDL 43 05/01/2022   Lab Results  Component Value Date   LDLCALC 39 05/01/2022   Lab Results  Component Value Date   TRIG 67 05/01/2022   Lab Results  Component Value Date   CHOLHDL 2.3 05/01/2022   Lab Results  Component Value Date   HGBA1C 5.6 09/18/2022      Assessment & Plan:   Problem List Items Addressed This  Visit       Cardiovascular and Mediastinum   Essential hypertension, benign    BP Readings from Last 1 Encounters:  01/29/23 134/73  Well-controlled with Amlodipine and Losartan Counseled for compliance with the medications Advised DASH diet and moderate exercise/walking, at least 150 mins/week      Relevant Orders   TSH   CBC with Differential/Platelet     Endocrine   Type 2 diabetes mellitus with diabetic neuropathy (HCC)    Lab Results  Component Value Date   HGBA1C 5.6 09/18/2022  Well-controlled On Basaglar 15 U at bedtime, increased dose to 20 units Needs to take NovoLog as sliding scale On Ozempic 2 mg On Metformin 1000 mg QD  Had Dexcom, but did not like it, but wants to try it again Used to follow up with Endocrinologist in [place], has appt with Dr Fransico Him in 07/24 On ARB and statin  Was on Cymbalta for neuropathy, but has stopped taking it On Percocet for chronic back pain, followed by pain clinic Added amitriptyline for insomnia, can also help with fatigue, headache and neuropathy      Relevant Medications   Semaglutide, 2 MG/DOSE, (OZEMPIC, 2 MG/DOSE,) 8 MG/3ML SOPN   Other Relevant Orders   Hemoglobin A1c   CMP14+EGFR     Other   Tobacco abuse    Smokes about 1 pack/day  Asked about quitting: confirms that she currently smokes cigarettes Advise to quit smoking: Educated about QUITTING to reduce the risk of cancer, cardio and cerebrovascular disease. Assess willingness: Unwilling to quit at this  time, but is working on cutting back. Assist with counseling and pharmacotherapy: Counseled for 5 minutes and literature provided. Arrange for follow up: Follow up in 3 months and continue to offer help.      Hyperlipidemia    On Atorvastatin      Relevant Orders   Lipid panel   Primary insomnia    She sleeps only 4 to 5 hours a day Chronic insomnia can lead to fatigue and headache as well She lives alone, does not report apneic episode, but is not clear about snoring Offered home sleep study for evaluation of insomnia and chronic fatigue, but she denies Added amitriptyline for insomnia, can also help with fatigue, headache and neuropathy      Relevant Medications   amitriptyline (ELAVIL) 10 MG tablet   Encounter for general adult medical examination with abnormal findings - Primary    Physical exam as documented. Counseling done  re healthy lifestyle involving commitment to 150 minutes exercise per week, heart healthy diet, and attaining healthy weight.The importance of adequate sleep also discussed. Changes in health habits are decided on by the patient with goals and time frames  set for achieving them. Immunization and cancer screening needs are specifically addressed at this visit.      Chronic fatigue    Could be multifactorial, in the setting of chronic medical conditions - type 2 DM, CAD and insomnia She is on chronic opioids, which can also cause fatigue Hb low normal, advised to start taking iron supplement Advised to take women's health multivitamin  Added amitriptyline for insomnia, can also help with fatigue, headache and neuropathy      Relevant Orders   TSH   Other Visit Diagnoses     Vitamin D deficiency       Relevant Orders   VITAMIN D 25 Hydroxy (Vit-D Deficiency, Fractures)       Meds ordered this encounter  Medications  amitriptyline (ELAVIL) 10 MG tablet    Sig: Take 1 tablet (10 mg total) by mouth at bedtime.    Dispense:  30 tablet     Refill:  3   Semaglutide, 2 MG/DOSE, (OZEMPIC, 2 MG/DOSE,) 8 MG/3ML SOPN    Sig: Inject 2 mg into the skin every 7 (seven) days.    Dispense:  3 mL    Refill:  3    Follow-up: Return in about 4 months (around 05/31/2023) for Insomnia and fatigue.    Anabel Halon, MD

## 2023-01-29 NOTE — Assessment & Plan Note (Signed)
On Atorvastatin 

## 2023-01-29 NOTE — Assessment & Plan Note (Signed)

## 2023-01-29 NOTE — Patient Instructions (Addendum)
Please start taking Amitriptyline as prescribed for insomnia. It can also improve headache.  Please continue to take medications as prescribed.  Please continue to follow low carb diet and perform moderate exercise/walking at least 150 mins/week.  Please continue start taking Basaglar 20 Units at nighttime.  Please follow sliding scale for Novolog before meals. Sliding Scale: Blood Sugar of 150-200 = 2 units, 201-250 = 4units, 251-300 = 6units, 301-350 = 8units, 351-400 = 10units

## 2023-01-29 NOTE — Assessment & Plan Note (Signed)
Smokes about 1 pack/day  Asked about quitting: confirms that she currently smokes cigarettes Advise to quit smoking: Educated about QUITTING to reduce the risk of cancer, cardio and cerebrovascular disease. Assess willingness: Unwilling to quit at this time, but is working on cutting back. Assist with counseling and pharmacotherapy: Counseled for 5 minutes and literature provided. Arrange for follow up: Follow up in 3 months and continue to offer help. 

## 2023-01-29 NOTE — Assessment & Plan Note (Signed)
BP Readings from Last 1 Encounters:  01/29/23 134/73   Well-controlled with Amlodipine and Losartan Counseled for compliance with the medications Advised DASH diet and moderate exercise/walking, at least 150 mins/week

## 2023-01-29 NOTE — Assessment & Plan Note (Signed)
Could be multifactorial, in the setting of chronic medical conditions - type 2 DM, CAD and insomnia She is on chronic opioids, which can also cause fatigue Hb low normal, advised to start taking iron supplement Advised to take women's health multivitamin  Added amitriptyline for insomnia, can also help with fatigue, headache and neuropathy

## 2023-01-29 NOTE — Assessment & Plan Note (Addendum)
Lab Results  Component Value Date   HGBA1C 5.6 09/18/2022   Well-controlled On Basaglar 15 U at bedtime, increased dose to 20 units Needs to take NovoLog as sliding scale On Ozempic 2 mg On Metformin 1000 mg QD  Had Dexcom, but did not like it, but wants to try it again Used to follow up with Endocrinologist in [place], has appt with Dr Fransico Him in 07/24 On ARB and statin  Was on Cymbalta for neuropathy, but has stopped taking it On Percocet for chronic back pain, followed by pain clinic Added amitriptyline for insomnia, can also help with fatigue, headache and neuropathy

## 2023-01-30 ENCOUNTER — Telehealth: Payer: Self-pay

## 2023-01-30 DIAGNOSIS — I10 Essential (primary) hypertension: Secondary | ICD-10-CM

## 2023-01-30 LAB — LIPID PANEL
Chol/HDL Ratio: 2 ratio (ref 0.0–4.4)
Cholesterol, Total: 110 mg/dL (ref 100–199)
HDL: 55 mg/dL (ref >39)
LDL Chol Calc (NIH): 41 mg/dL (ref 0–99)
Triglycerides: 61 mg/dL (ref 0–149)
VLDL Cholesterol Cal: 14 mg/dL (ref 5–40)

## 2023-01-30 LAB — COMPREHENSIVE METABOLIC PANEL
ALT: 17 IU/L (ref 0–32)
AST: 20 IU/L (ref 0–40)
Albumin: 4.2 g/dL (ref 3.9–4.9)
Alkaline Phosphatase: 131 IU/L — ABNORMAL HIGH (ref 44–121)
BUN/Creatinine Ratio: 23 (ref 12–28)
BUN: 20 mg/dL (ref 8–27)
Bilirubin Total: 0.3 mg/dL (ref 0.0–1.2)
CO2: 21 mmol/L (ref 20–29)
Calcium: 10.4 mg/dL — ABNORMAL HIGH (ref 8.7–10.3)
Chloride: 104 mmol/L (ref 96–106)
Creatinine, Ser: 0.86 mg/dL (ref 0.57–1.00)
Globulin, Total: 3 g/dL (ref 1.5–4.5)
Glucose: 125 mg/dL — ABNORMAL HIGH (ref 70–99)
Potassium: 6.1 mmol/L — ABNORMAL HIGH (ref 3.5–5.2)
Sodium: 141 mmol/L (ref 134–144)
Total Protein: 7.2 g/dL (ref 6.0–8.5)
eGFR: 73 mL/min/{1.73_m2} (ref >59)

## 2023-01-30 LAB — CMP14+EGFR
ALT: 11 IU/L (ref 0–32)
AST: 15 IU/L (ref 0–40)
Albumin: 4.2 g/dL (ref 3.9–4.9)
Alkaline Phosphatase: 130 IU/L — ABNORMAL HIGH (ref 44–121)
BUN/Creatinine Ratio: 21 (ref 12–28)
BUN: 19 mg/dL (ref 8–27)
Bilirubin Total: 0.3 mg/dL (ref 0.0–1.2)
CO2: 22 mmol/L (ref 20–29)
Calcium: 10.1 mg/dL (ref 8.7–10.3)
Chloride: 104 mmol/L (ref 96–106)
Creatinine, Ser: 0.89 mg/dL (ref 0.57–1.00)
Globulin, Total: 3 g/dL (ref 1.5–4.5)
Glucose: 125 mg/dL — ABNORMAL HIGH (ref 70–99)
Potassium: 5.6 mmol/L — ABNORMAL HIGH (ref 3.5–5.2)
Sodium: 140 mmol/L (ref 134–144)
Total Protein: 7.2 g/dL (ref 6.0–8.5)
eGFR: 70 mL/min/{1.73_m2} (ref >59)

## 2023-01-30 LAB — TSH: TSH: 1.18 u[IU]/mL (ref 0.450–4.500)

## 2023-01-30 LAB — CBC WITH DIFFERENTIAL/PLATELET
Basophils Absolute: 0.1 10*3/uL (ref 0.0–0.2)
Basos: 1 %
EOS (ABSOLUTE): 0.2 10*3/uL (ref 0.0–0.4)
Eos: 3 %
Hematocrit: 42.3 % (ref 34.0–46.6)
Hemoglobin: 13.6 g/dL (ref 11.1–15.9)
Immature Grans (Abs): 0 10*3/uL (ref 0.0–0.1)
Immature Granulocytes: 1 %
Lymphocytes Absolute: 1.4 10*3/uL (ref 0.7–3.1)
Lymphs: 24 %
MCH: 31 pg (ref 26.6–33.0)
MCHC: 32.2 g/dL (ref 31.5–35.7)
MCV: 96 fL (ref 79–97)
Monocytes Absolute: 0.5 10*3/uL (ref 0.1–0.9)
Monocytes: 8 %
Neutrophils Absolute: 3.7 10*3/uL (ref 1.4–7.0)
Neutrophils: 63 %
Platelets: 362 10*3/uL (ref 150–450)
RBC: 4.39 x10E6/uL (ref 3.77–5.28)
RDW: 13.4 % (ref 11.7–15.4)
WBC: 5.9 10*3/uL (ref 3.4–10.8)

## 2023-01-30 LAB — HEMOGLOBIN A1C
Est. average glucose Bld gHb Est-mCnc: 123 mg/dL
Hgb A1c MFr Bld: 5.9 % — ABNORMAL HIGH (ref 4.8–5.6)

## 2023-01-30 LAB — VITAMIN D 25 HYDROXY (VIT D DEFICIENCY, FRACTURES): Vit D, 25-Hydroxy: 59.3 ng/mL (ref 30.0–100.0)

## 2023-01-30 NOTE — Telephone Encounter (Signed)
-----   Message from Roma Kayser, MD sent at 01/30/2023 12:45 PM EDT ----- Ander Slade, Would you call this pt and ask if she is taking potassium supplement?  If so, she needs to hold. If not, I want her to limit potassium rich food/drinks ( potatoes, dairy, bananas, oranges, etc) and repeat CMP in 10 days. She has high potassium of 6.1.

## 2023-01-30 NOTE — Telephone Encounter (Signed)
Spoke with pt making her aware her potassium level is elevated at 6.1. She stated she is not taking any potassium supplements OTC or prescription. Advised her to limit potassium rich foods such as potatoes, bananas, oranges and dairy and repeat a CMP in 10 days per Dr.Nida's orders. Pt voiced understanding.

## 2023-02-19 ENCOUNTER — Ambulatory Visit: Payer: Medicare HMO | Admitting: "Endocrinology

## 2023-02-22 ENCOUNTER — Other Ambulatory Visit: Payer: Self-pay | Admitting: Internal Medicine

## 2023-02-22 ENCOUNTER — Other Ambulatory Visit: Payer: Self-pay

## 2023-02-22 DIAGNOSIS — I10 Essential (primary) hypertension: Secondary | ICD-10-CM

## 2023-02-22 DIAGNOSIS — E1140 Type 2 diabetes mellitus with diabetic neuropathy, unspecified: Secondary | ICD-10-CM

## 2023-02-22 MED ORDER — LOSARTAN POTASSIUM 100 MG PO TABS
100.0000 mg | ORAL_TABLET | Freq: Every day | ORAL | 1 refills | Status: DC
Start: 2023-02-22 — End: 2023-08-06

## 2023-02-22 MED ORDER — NOVOLOG FLEXPEN 100 UNIT/ML ~~LOC~~ SOPN: PEN_INJECTOR | SUBCUTANEOUS | 10 refills | Status: DC

## 2023-02-22 MED ORDER — BD PEN NEEDLE NANO U/F 32G X 4 MM MISC: 2 refills | Status: DC

## 2023-02-26 DIAGNOSIS — Z6837 Body mass index (BMI) 37.0-37.9, adult: Secondary | ICD-10-CM | POA: Diagnosis not present

## 2023-02-26 DIAGNOSIS — G894 Chronic pain syndrome: Secondary | ICD-10-CM | POA: Diagnosis not present

## 2023-02-26 DIAGNOSIS — I10 Essential (primary) hypertension: Secondary | ICD-10-CM | POA: Diagnosis not present

## 2023-02-26 DIAGNOSIS — M47816 Spondylosis without myelopathy or radiculopathy, lumbar region: Secondary | ICD-10-CM | POA: Diagnosis not present

## 2023-02-26 DIAGNOSIS — M545 Low back pain, unspecified: Secondary | ICD-10-CM | POA: Diagnosis not present

## 2023-02-26 DIAGNOSIS — E6609 Other obesity due to excess calories: Secondary | ICD-10-CM | POA: Diagnosis not present

## 2023-02-26 DIAGNOSIS — Z79899 Other long term (current) drug therapy: Secondary | ICD-10-CM | POA: Diagnosis not present

## 2023-02-26 LAB — LAB REPORT - SCANNED: EGFR: 78

## 2023-03-22 ENCOUNTER — Encounter: Payer: Self-pay | Admitting: "Endocrinology

## 2023-03-22 ENCOUNTER — Ambulatory Visit (INDEPENDENT_AMBULATORY_CARE_PROVIDER_SITE_OTHER): Payer: Medicare HMO | Admitting: "Endocrinology

## 2023-03-22 VITALS — BP 112/58 | HR 104 | Ht 63.0 in | Wt 211.8 lb

## 2023-03-22 DIAGNOSIS — E782 Mixed hyperlipidemia: Secondary | ICD-10-CM

## 2023-03-22 DIAGNOSIS — Z794 Long term (current) use of insulin: Secondary | ICD-10-CM

## 2023-03-22 DIAGNOSIS — I10 Essential (primary) hypertension: Secondary | ICD-10-CM

## 2023-03-22 DIAGNOSIS — E1140 Type 2 diabetes mellitus with diabetic neuropathy, unspecified: Secondary | ICD-10-CM

## 2023-03-22 DIAGNOSIS — F17200 Nicotine dependence, unspecified, uncomplicated: Secondary | ICD-10-CM | POA: Diagnosis not present

## 2023-03-22 MED ORDER — BASAGLAR KWIKPEN 100 UNIT/ML ~~LOC~~ SOPN: 30.0000 [IU] | PEN_INJECTOR | Freq: Every day | SUBCUTANEOUS | 1 refills | Status: DC

## 2023-03-22 MED ORDER — NOVOLOG FLEXPEN 100 UNIT/ML ~~LOC~~ SOPN
PEN_INJECTOR | SUBCUTANEOUS | 0 refills | Status: DC
Start: 2023-03-22 — End: 2023-05-14

## 2023-03-22 MED ORDER — EMPAGLIFLOZIN 10 MG PO TABS: 10.0000 mg | ORAL_TABLET | Freq: Every day | ORAL | 1 refills | Status: DC

## 2023-03-22 NOTE — Patient Instructions (Signed)
                                     Advice for Weight Management  -For most of us the best way to lose weight is by diet management. Generally speaking, diet management means consuming less calories intentionally which over time brings about progressive weight loss.  This can be achieved more effectively by avoiding ultra processed carbohydrates, processed meats, unhealthy fats.    It is critically important to know your numbers: how much calorie you are consuming and how much calorie you need. More importantly, our carbohydrates sources should be unprocessed naturally occurring  complex starch food items.  It is always important to balance nutrition also by  appropriate intake of proteins (mainly plant-based), healthy fats/oils, plenty of fruits and vegetables.   -The American College of Lifestyle Medicine (ACL M) recommends nutrition derived mostly from Whole Food, Plant Predominant Sources example an apple instead of applesauce or apple pie. Eat Plenty of vegetables, Mushrooms, fruits, Legumes, Whole Grains, Nuts, seeds in lieu of processed meats, processed snacks/pastries red meat, poultry, eggs.  Use only water or unsweetened tea for hydration.  The College also recommends the need to stay away from risky substances including alcohol, smoking; obtaining 7-9 hours of restorative sleep, at least 150 minutes of moderate intensity exercise weekly, importance of healthy social connections, and being mindful of stress and seek help when it is overwhelming.    -Sticking to a routine mealtime to eat 3 meals a day and avoiding unnecessary snacks is shown to have a big role in weight control. Under normal circumstances, the only time we burn stored energy is when we are hungry, so allow  some hunger to take place- hunger means no food between appropriate meal times, only water.  It is not advisable to starve.   -It is better to avoid simple carbohydrates including:  Cakes, Sweet Desserts, Ice Cream, Soda (diet and regular), Sweet Tea, Candies, Chips, Cookies, Store Bought Juices, Alcohol in Excess of  1-2 drinks a day, Lemonade,  Artificial Sweeteners, Doughnuts, Coffee Creamers, "Sugar-free" Products, etc, etc.  This is not a complete list.....    -Consulting with certified diabetes educators is proven to provide you with the most accurate and current information on diet.  Also, you may be  interested in discussing diet options/exchanges , we can schedule a visit with Stephanie Hammond, RDN, CDE for individualized nutrition education.  -Exercise: If you are able: 30 -60 minutes a day ,4 days a week, or 150 minutes of moderate intensity exercise weekly.    The longer the better if tolerated.  Combine stretch, strength, and aerobic activities.  If you were told in the past that you have high risk for cardiovascular diseases, or if you are currently symptomatic, you may seek evaluation by your heart doctor prior to initiating moderate to intense exercise programs.                                  Additional Care Considerations for Diabetes/Prediabetes   -Diabetes  is a chronic disease.  The most important care consideration is regular follow-up with your diabetes care provider with the goal being avoiding or delaying its complications and to take advantage of advances in medications and technology.  If appropriate actions are taken early enough, type 2 diabetes can even be   reversed.  Seek information from the right source.  - Whole Food, Plant Predominant Nutrition is highly recommended: Eat Plenty of vegetables, Mushrooms, fruits, Legumes, Whole Grains, Nuts, seeds in lieu of processed meats, processed snacks/pastries red meat, poultry, eggs as recommended by American College of  Lifestyle Medicine (ACLM).  -Type 2 diabetes is known to coexist with other important comorbidities such as high blood pressure and high cholesterol.  It is critical to control not only the  diabetes but also the high blood pressure and high cholesterol to minimize and delay the risk of complications including coronary artery disease, stroke, amputations, blindness, etc.  The good news is that this diet recommendation for type 2 diabetes is also very helpful for managing high cholesterol and high blood blood pressure.  - Studies showed that people with diabetes will benefit from a class of medications known as ACE inhibitors and statins.  Unless there are specific reasons not to be on these medications, the standard of care is to consider getting one from these groups of medications at an optimal doses.  These medications are generally considered safe and proven to help protect the heart and the kidneys.    - People with diabetes are encouraged to initiate and maintain regular follow-up with eye doctors, foot doctors, dentists , and if necessary heart and kidney doctors.     - It is highly recommended that people with diabetes quit smoking or stay away from smoking, and get yearly  flu vaccine and pneumonia vaccine at least every 5 years.  See above for additional recommendations on exercise, sleep, stress management , and healthy social connections.      

## 2023-03-22 NOTE — Progress Notes (Signed)
03/22/2023, 5:20 PM   Endocrinology follow-up note  Subjective:    Patient ID: Stephanie Hammond, female    DOB: 10-Dec-1950.  Stephanie Hammond is being seen in follow-up after she was seen in consultation for management of currently uncontrolled symptomatic diabetes requested by  Anabel Halon, MD.   Past Medical History:  Diagnosis Date   Anemia    Blood transfusion 2009   after GI bleed   CAD (coronary artery disease)    Diabetes mellitus    2   Diverticulitis    Diverticulosis    Fear of local anesthetic    01-02-2019, patient states with last surgery in 2016 , she refused to have epidural before she was sedated d/t to extreme aversion to needles near her back     GERD (gastroesophageal reflux disease)    Gout    Headache    occ if sugar is  too hi or low    Heart murmur    "i was told this years and years and years ago "   History of GI diverticular bleed 08/2004   Hypertension    IBS (irritable bowel syndrome)    Osteoarthritis    Tachycardia    "it stays like that for years between 119 and 121 and it doesnt bother me  none "     Past Surgical History:  Procedure Laterality Date   CHOLECYSTECTOMY  2003   COLON SURGERY     COLONOSCOPY  2000   Dr. Russella Dar: marked diverticulosis, difficult procedure due to adhesions, polyps benign   COLONOSCOPY  2006   Dr. Marina Goodell: severe pandiverticulosis   COLONOSCOPY N/A 02/03/2013   SLF: 4 COLORECTAL POLYPS REMOVED/Moderate diverticulosis throughout the entire examined colon/Small internal hemorrhoids   COLONOSCOPY WITH PROPOFOL N/A 04/29/2019   Procedure: COLONOSCOPY WITH PROPOFOL;  Surgeon: West Bali, MD;  Location: [facility];  Service: Endoscopy;  Laterality: N/A;  8:30am   JOINT REPLACEMENT Right 2009   Knee Replacement Right    POLYPECTOMY  04/29/2019   Procedure: POLYPECTOMY;  Surgeon: West Bali, MD;  Location: [facility];  Service: Endoscopy;;  colon   PTCA     over 10 years ago , unsure of exact year , does recall that no stents were placed    REPLACEMENT TOTAL KNEE     right    TOTAL KNEE ARTHROPLASTY Left 03/12/2015   Procedure: LEFT TOTAL KNEE REPLACEMENT;  Surgeon: Vickki Hearing, MD;  Location: [facility];  Service: Orthopedics;  Laterality: Left;   TOTAL KNEE REVISION Left 01/09/2019   Procedure: TOTAL KNEE REVISION;  Surgeon: Samson Frederic, MD;  Location: [facility];  Service: Orthopedics;  Laterality: Left;   VAGINAL HYSTERECTOMY      Social History   Socioeconomic History   Marital status: Married    Spouse name: Not on file   Number of children: 2   Years of education: Not on file   Highest education level: Not on file  Occupational History   Not on file  Tobacco Use   Smoking status: Every Day  Current packs/day: 1.00    Average packs/day: 1 pack/day for 35.0 years (35.0 ttl pk-yrs)    Types: Cigarettes    Passive exposure: Current   Smokeless tobacco: Never  Vaping Use   Vaping status: Never Used  Substance and Sexual Activity   Alcohol use: No   Drug use: No   Sexual activity: Not Currently    Birth control/protection: Surgical    Comment: hyst  Other Topics Concern   Not on file  Social History Narrative   Husband passed away lives alone    Social Determinants of Health   Financial Resource Strain: Low Risk  (03/03/2022)   Overall Financial Resource Strain (CARDIA)    Difficulty of Paying Living Expenses: Not hard at all  Food Insecurity: No Food Insecurity (03/03/2022)   Hunger Vital Sign    Worried About Running Out of Food in the Last Year: Never true    Ran Out of Food in the Last Year: Never true  Transportation Needs: No Transportation Needs (03/03/2022)   PRAPARE - Administrator, Civil Service (Medical): No    Lack of Transportation (Non-Medical): No  Physical Activity: Inactive (03/03/2022)   Exercise Vital Sign    Days of Exercise per Week: 0  days    Minutes of Exercise per Session: 0 min  Stress: No Stress Concern Present (03/03/2022)   [employer] of Occupational Health - Occupational Stress Questionnaire    Feeling of Stress : Not at all  Social Connections: Moderately Isolated (03/03/2022)   Social Connection and Isolation Panel [NHANES]    Frequency of Communication with Friends and Family: More than three times a week    Frequency of Social Gatherings with Friends and Family: More than three times a week    Attends Religious Services: More than 4 times per year    Active Member of [employer] or Organizations: No    Attends Banker Meetings: Never    Marital Status: Widowed    Family History  Problem Relation Age of Onset   Colon polyps Sister    Cancer Mother        lung    Diabetes Mother    Cancer Father        stomach    Stomach cancer Father        questionable   Colon cancer Neg Hx     Outpatient Encounter Medications as of 03/22/2023  Medication Sig   empagliflozin (JARDIANCE) 10 MG TABS tablet Take 1 tablet (10 mg total) by mouth daily before breakfast.   amitriptyline (ELAVIL) 10 MG tablet Take 1 tablet (10 mg total) by mouth at bedtime. (Patient not taking: Reported on 03/22/2023)   amLODipine (NORVASC) 10 MG tablet TAKE ONE TABLET BY MOUTH ONCE DAILY   atorvastatin (LIPITOR) 20 MG tablet TAKE ONE TABLET BY MOUTH EVERY DAY   blood glucose meter kit and supplies Dispense based on patient and insurance preference. Use up to four times daily as directed. (FOR ICD-10 E10.9, E11.9).   BUTRANS 15 MCG/HR Place 1 patch onto the skin once a week.   cetirizine (ZYRTEC) 10 MG tablet Take 1 tablet (10 mg total) by mouth daily. (Patient not taking: Reported on 03/22/2023)   fluticasone (FLONASE) 50 MCG/ACT nasal spray Place 1 spray into both nostrils daily as needed for allergies or rhinitis. (Patient not taking: Reported on 03/22/2023)   Glucose Blood (BLOOD GLUCOSE TEST STRIPS) STRP Please dispense based  on patient and insurance preference. Use as directed  to monitor FSBS 3x daily. Dx: E11.9  For testing 4 times daily   insulin aspart (NOVOLOG FLEXPEN) 100 UNIT/ML FlexPen Hold for now.   Insulin Glargine (BASAGLAR KWIKPEN) 100 UNIT/ML Inject 30 Units into the skin at bedtime.   Insulin Pen Needle (BD PEN NEEDLE NANO U/F) 32G X 4 MM MISC USE AS DIRECTED TO INJECT INSULIN FOUR TIMES DAILY   Lancets MISC Please dispense based on patient and insurance preference. Use as directed to monitor FSBS 3x daily. Dx: E11.9  For testing 4 times daily   losartan (COZAAR) 100 MG tablet Take 1 tablet (100 mg total) by mouth daily.   metFORMIN (GLUCOPHAGE-XR) 500 MG 24 hr tablet TAKE 2 TABLETS(1000 MG) BY MOUTH DAILY WITH BREAKFAST   omeprazole (PRILOSEC) 20 MG capsule TAKE ONE CAPSULE BY MOUTH ONCE DAILY 30 MINUTES BEFORE morning MEAL   oxyCODONE-acetaminophen (PERCOCET) 10-325 MG tablet Take 1 tablet by mouth 2 (two) times daily.   Semaglutide, 2 MG/DOSE, (OZEMPIC, 2 MG/DOSE,) 8 MG/3ML SOPN Inject 2 mg into the skin every 7 (seven) days.   Vitamin D, Ergocalciferol, (DRISDOL) 1.25 MG (50000 UNIT) CAPS capsule Take 50,000 Units by mouth once a week.   [DISCONTINUED] insulin aspart (NOVOLOG FLEXPEN) 100 UNIT/ML FlexPen Sliding Scale: Blood Sugar of 150-200 = 2 units, 201-250 = 4units, 251-300 = 6units, 301-350 = 8units, 351-400 = 10units   [DISCONTINUED] Insulin Glargine (BASAGLAR KWIKPEN) 100 UNIT/ML Inject 25 Units into the skin at bedtime. (Patient taking differently: Inject 20-25 Units into the skin at bedtime.)   No facility-administered encounter medications on file as of 03/22/2023.    ALLERGIES: Allergies  Allergen Reactions   Ace Inhibitors Cough   Bentyl [Dicyclomine Hcl] Other (See Comments)    Blurry vision   Penicillins Itching and Swelling    Whelps Did it involve swelling of the face/tongue/throat, SOB, or low BP? No Did it involve sudden or severe rash/hives, skin peeling, or any reaction  on the inside of your mouth or nose? No Did you need to seek medical attention at a hospital or doctor's office? No When did it last happen?      20 years If all above answers are "NO", may proceed with cephalosporin use.     VACCINATION STATUS: Immunization History  Administered Date(s) Administered   Moderna Sars-Covid-2 Vaccination 10/01/2019, 10/29/2019, 07/22/2020   PPD Test 09/03/2017, 06/08/2021   Zoster Recombinant(Shingrix) 11/07/2017, 11/07/2017    Diabetes She presents for her follow-up diabetic visit. She has type 2 diabetes mellitus. Onset time: She was diagnosed at approximate age of 42 years. Her disease course has been worsening. There are no hypoglycemic associated symptoms. Pertinent negatives for hypoglycemia include no confusion, headaches, pallor or seizures. Associated symptoms include fatigue. Pertinent negatives for diabetes include no chest pain, no polydipsia, no polyphagia and no polyuria. There are no hypoglycemic complications. Symptoms are improving. There are no diabetic complications. Risk factors for coronary artery disease include diabetes mellitus, dyslipidemia, hypertension, family history, sedentary lifestyle, post-menopausal and tobacco exposure. Current diabetic treatment includes insulin injections and oral agent (monotherapy) (She is currently on Levemir 15 units nightly, NovoLog on a sliding scale.  She is also on Metformin 1000 mg daily at breakfast.). Her weight is increasing steadily. She is following a generally unhealthy diet. When asked about meal planning, she reported none. She has not had a previous visit with a dietitian. Her home blood glucose trend is increasing steadily. Her breakfast blood glucose range is generally >200 mg/dl. Her lunch blood glucose range  is generally >200 mg/dl. Her dinner blood glucose range is generally >200 mg/dl. Her bedtime blood glucose range is generally >200 mg/dl. Her overall blood glucose range is >200 mg/dl. (She  did not show up since September 2021.  Patient presents with a meter showing average blood glucose of 209-234 over the last 30 days despite her previsit labs showing A1c of 5.9%.  She is known to have discordance between her average blood glucose and A1c.  Her recent CBC shows hemoglobin of 12.7, and hematocrit of 36.1. She does not have recent hypoglycemia.) An ACE inhibitor/angiotensin II receptor blocker is being taken. Eye exam is current.  Hyperlipidemia This is a chronic problem. The current episode started more than 1 year ago. The problem is controlled. Exacerbating diseases include diabetes and obesity. Pertinent negatives include no chest pain, myalgias or shortness of breath. Current antihyperlipidemic treatment includes statins. Risk factors for coronary artery disease include dyslipidemia, diabetes mellitus, hypertension, a sedentary lifestyle, post-menopausal, obesity and family history.  Hypertension This is a chronic problem. Pertinent negatives include no chest pain, headaches, palpitations or shortness of breath. Risk factors for coronary artery disease include dyslipidemia, diabetes mellitus, obesity, sedentary lifestyle, smoking/tobacco exposure, family history and post-menopausal state. Past treatments include angiotensin blockers.     Review of Systems  Constitutional:  Positive for fatigue. Negative for chills, fever and unexpected weight change.  HENT:  Negative for trouble swallowing and voice change.   Eyes:  Negative for visual disturbance.  Respiratory:  Negative for cough, shortness of breath and wheezing.   Cardiovascular:  Negative for chest pain, palpitations and leg swelling.  Gastrointestinal:  Negative for diarrhea, nausea and vomiting.  Endocrine: Negative for cold intolerance, heat intolerance, polydipsia, polyphagia and polyuria.  Musculoskeletal:  Negative for arthralgias and myalgias.  Skin:  Negative for color change, pallor, rash and wound.  Neurological:   Negative for seizures and headaches.  Psychiatric/Behavioral:  Negative for confusion and suicidal ideas.     Objective:       03/22/2023    2:46 PM 01/29/2023    8:24 AM 09/18/2022    8:55 AM  Vitals with BMI  Height 5\' 3"  5\' 3"  5\' 3"   Weight 211 lbs 13 oz 208 lbs 6 oz 198 lbs 3 oz  BMI 37.53 36.93 35.12  Systolic 112 134 161  Diastolic 58 73 70  Pulse 104 106 112    BP (!) 112/58   Pulse (!) 104   Ht 5\' 3"  (1.6 m)   Wt 211 lb 12.8 oz (96.1 kg)   BMI 37.52 kg/m   Wt Readings from Last 3 Encounters:  03/22/23 211 lb 12.8 oz (96.1 kg)  01/29/23 208 lb 6.4 oz (94.5 kg)  09/18/22 198 lb 3.2 oz (89.9 kg)     Physical Exam Constitutional:      Appearance: She is well-developed.  HENT:     Head: Normocephalic and atraumatic.  Neck:     Thyroid: No thyromegaly.     Trachea: No tracheal deviation.  Cardiovascular:     Rate and Rhythm: Normal rate and regular rhythm.  Pulmonary:     Effort: Pulmonary effort is normal.  Abdominal:     Tenderness: There is no abdominal tenderness. There is no guarding.  Musculoskeletal:        General: Normal range of motion.     Cervical back: Normal range of motion and neck supple.     Comments: Her foot exam is normal.  Skin:    General:  Skin is warm and dry.     Coloration: Skin is not pale.     Findings: No erythema or rash.  Neurological:     Mental Status: She is alert and oriented to person, place, and time.     Cranial Nerves: No cranial nerve deficit.     Coordination: Coordination normal.     Deep Tendon Reflexes: Reflexes are normal and symmetric.  Psychiatric:        Judgment: Judgment normal.    CMP ( most recent) CMP     Component Value Date/Time   NA 141 01/29/2023 0925   K 6.1 (H) 01/29/2023 0925   CL 104 01/29/2023 0925   CO2 21 01/29/2023 0925   GLUCOSE 125 (H) 01/29/2023 0925   GLUCOSE CANCELED 10/04/2020 1301   GLUCOSE 349 (H) 10/06/2015 1436   BUN 20 01/29/2023 0925   CREATININE 0.86 01/29/2023 0925    CREATININE 0.87 03/19/2020 1127   CALCIUM 10.4 (H) 01/29/2023 0925   PROT 7.2 01/29/2023 0925   ALBUMIN 4.2 01/29/2023 0925   AST 20 01/29/2023 0925   ALT 17 01/29/2023 0925   ALKPHOS 131 (H) 01/29/2023 0925   BILITOT 0.3 01/29/2023 0925   GFRNONAA >60 04/25/2019 1032   GFRNONAA 93 06/14/2017 0814   GFRAA >60 04/25/2019 1032   GFRAA 108 06/14/2017 0814     Diabetic Labs (most recent): Lab Results  Component Value Date   HGBA1C 5.9 (H) 01/29/2023   HGBA1C 5.6 09/18/2022   HGBA1C 5.3 05/01/2022   MICROALBUR 0.9 10/04/2020   MICROALBUR 0.5 09/13/2018   MICROALBUR <0.2 06/14/2017     Lipid Panel ( most recent) Lipid Panel     Component Value Date/Time   CHOL 110 01/29/2023 0920   TRIG 61 01/29/2023 0920   HDL 55 01/29/2023 0920   CHOLHDL 2.0 01/29/2023 0920   CHOLHDL 2.5 03/19/2020 1127   VLDL 27 09/11/2016 1255   LDLCALC 41 01/29/2023 0920   LDLCALC CANCELED 10/04/2020 1301   LDLDIRECT 68 12/01/2013 1454   LABVLDL 14 01/29/2023 0920      Lab Results  Component Value Date   TSH 1.180 01/29/2023   TSH 0.987 05/01/2022   TSH 0.588 05/04/2021   TSH 0.75 09/10/2017   TSH 1.384 05/27/2012      Assessment & Plan:   1. Type 2 diabetes mellitus with diabetic neuropathy, with long-term current use of insulin (HCC)  - Stephanie Hammond has currently uncontrolled symptomatic type 2 DM since  72 years of age.   She did not show up since September 2021.  Patient presents with a meter showing average blood glucose of 209-234 over the last 30 days despite her previsit labs showing A1c of 5.9%.  She is known to have discordance between her average blood glucose and A1c.  Her recent CBC shows hemoglobin of 12.7, and hematocrit of 36.1. She does not have recent hypoglycemia.  -She has a habit of not showing up for appointments for proper care.  She no-showed since September 2021.    - I had a long discussion with her about the progressive nature of diabetes and the  pathology behind its complications. -her diabetes is complicated by chronic smoking, comorbid hypertension, hyperlipidemia obesity/sedentary/and she remains at a high risk for more acute and chronic complications which include CAD, CVA, CKD, retinopathy, and neuropathy. These are all discussed in detail with her.  - I have counseled her on diet  and weight management  by adopting a carbohydrate restricted/protein rich  diet. Patient is encouraged to switch to  unprocessed or minimally processed    complex starch and increased protein intake (animal or plant source), fruits, and vegetables. -  she is advised to stick to a routine mealtimes to eat 3 meals  a day and avoid unnecessary snacks ( to snack only to correct hypoglycemia).   - she  admits there is a room for improvement in her diet and drink choices. -  Suggestion is made for her to avoid simple carbohydrates  from her diet including Cakes, Sweet Desserts / Pastries, Ice Cream, Soda (diet and regular), Sweet Tea, Candies, Chips, Cookies, Sweet Pastries,  Store Bought Juices, Alcohol in Excess of  1-2 drinks a day, Artificial Sweeteners, Coffee Creamer, and "Sugar-free" Products. This will help patient to have stable blood glucose profile and potentially avoid unintended weight gain.   - she will be scheduled with Norm Salt, RDN, CDE for diabetes education.  - I have approached her with the following individualized plan to manage  her diabetes and patient agrees:   -In light of her presentation with significant hyperglycemia discordant with her recent A1c of 5.9%, she will continue to need at least basal insulin.    -I advised her to increase her Basaglar to 30 units nightly , advised to hold NovoLog for now.  She will continue to monitor blood glucose twice a day-daily before breakfast and at bedtime.  - she is encouraged to call clinic for blood glucose levels less than 70 or above 200 mg /dl. -She was offered a CGM, however she claims  that she was bothered by the alarms  the last time she tried freestyle libre and Dexcom. -She is advised to continue metformin 500 mg p.o. daily at breakfast.  At this time she will benefit from an SGLT2 inhibitor. -I discussed and added Jardiance 10 mg p.o. daily at breakfast.  Side effects and precautions discussed with her. - she will be considered for incretin therapy as appropriate next visit.  - Specific targets for  A1c;  LDL, HDL,  and Triglycerides were discussed with the patient.  2) Blood Pressure /Hypertension:  -Her blood pressure is controlled to target.   she is advised to continue her current medications including losartan 50 mg p.o. daily with breakfast . 3) Lipids/Hyperlipidemia:   Review of her recent lipid panel showed  controlled  LDL at 68 .  she  is advised to continue atorvastatin 20 mg p.o. daily at bedtime.    Side effects and precautions discussed with her.  4)  Weight/Diet:  Body mass index is 37.52 kg/m.  -   clearly complicating her diabetes care.   she is  a candidate for weight loss. I discussed with her the fact that loss of 5 - 10% of her  current body weight will have the most impact on her diabetes management.  Exercise, and detailed carbohydrates information provided  -  detailed on discharge instructions.  5) Chronic Care/Health Maintenance:  -she  is on ACEI/ARB and Statin medications and  is encouraged to initiate and continue to follow up with Ophthalmology, Dentist,  Podiatrist at least yearly or according to recommendations, and advised to   stay away from smoking. I have recommended yearly flu vaccine and pneumonia vaccine at least every 5 years; moderate intensity exercise for up to 150 minutes weekly; and  sleep for at least 7 hours a day.  The patient was counseled on the dangers of tobacco use, and was advised to quit.  Reviewed strategies to maximize success, including removing cigarettes and smoking materials from environment.   - she is   advised to maintain close follow up with Anabel Halon, MD for primary care needs, as well as her other providers for optimal and coordinated care.    I spent  41  minutes in the care of the patient today including review of labs from CMP, Lipids, Thyroid Function, Hematology (current and previous including abstractions from other facilities); face-to-face time discussing  her blood glucose readings/logs, discussing hypoglycemia and hyperglycemia episodes and symptoms, medications doses, her options of short and long term treatment based on the latest standards of care / guidelines;  discussion about incorporating lifestyle medicine;  and documenting the encounter. Risk reduction counseling performed per USPSTF guidelines to reduce  obesity and cardiovascular risk factors.     Please refer to Patient Instructions for Blood Glucose Monitoring and Insulin/Medications Dosing Guide"  in media tab for additional information. Please  also refer to " Patient Self Inventory" in the Media  tab for reviewed elements of pertinent patient history.  Stephanie Hammond participated in the discussions, expressed understanding, and voiced agreement with the above plans.  All questions were answered to her satisfaction. she is encouraged to contact clinic should she have any questions or concerns prior to her return visit.  Follow up plan: - Return in about 4 weeks (around 04/19/2023) for F/U with Meter/CGM Megan Salon Only - no Labs.  Marquis Lunch, MD [facility] [address] [place], [place] [address] Phone: [phone]  Fax: [phone]    03/22/2023, 5:20 PM  This note was partially dictated with voice recognition software. Similar sounding words can be transcribed inadequately or may not  be corrected upon review.

## 2023-03-28 ENCOUNTER — Telehealth: Payer: Self-pay | Admitting: Internal Medicine

## 2023-03-28 NOTE — Telephone Encounter (Signed)
Spoke to pharmacy.

## 2023-03-28 NOTE — Telephone Encounter (Signed)
[facility] calling needing clarification on insulin aspart (NOVOLOG FLEXPEN) 100 UNIT/ML FlexPen [[id number]] Please advise [phone] Thank you

## 2023-04-02 DIAGNOSIS — M545 Low back pain, unspecified: Secondary | ICD-10-CM | POA: Diagnosis not present

## 2023-04-02 DIAGNOSIS — Z6837 Body mass index (BMI) 37.0-37.9, adult: Secondary | ICD-10-CM | POA: Diagnosis not present

## 2023-04-02 DIAGNOSIS — M47816 Spondylosis without myelopathy or radiculopathy, lumbar region: Secondary | ICD-10-CM | POA: Diagnosis not present

## 2023-04-02 DIAGNOSIS — G894 Chronic pain syndrome: Secondary | ICD-10-CM | POA: Diagnosis not present

## 2023-04-02 DIAGNOSIS — Z79899 Other long term (current) drug therapy: Secondary | ICD-10-CM | POA: Diagnosis not present

## 2023-04-02 DIAGNOSIS — G8929 Other chronic pain: Secondary | ICD-10-CM | POA: Diagnosis not present

## 2023-04-02 DIAGNOSIS — I10 Essential (primary) hypertension: Secondary | ICD-10-CM | POA: Diagnosis not present

## 2023-04-02 DIAGNOSIS — E6609 Other obesity due to excess calories: Secondary | ICD-10-CM | POA: Diagnosis not present

## 2023-04-10 ENCOUNTER — Other Ambulatory Visit: Payer: Self-pay | Admitting: Internal Medicine

## 2023-04-16 ENCOUNTER — Ambulatory Visit: Payer: Medicare HMO | Admitting: "Endocrinology

## 2023-04-19 DIAGNOSIS — J309 Allergic rhinitis, unspecified: Secondary | ICD-10-CM | POA: Diagnosis not present

## 2023-04-19 DIAGNOSIS — G4733 Obstructive sleep apnea (adult) (pediatric): Secondary | ICD-10-CM | POA: Diagnosis not present

## 2023-04-19 DIAGNOSIS — M545 Low back pain, unspecified: Secondary | ICD-10-CM | POA: Diagnosis not present

## 2023-04-19 DIAGNOSIS — M1990 Unspecified osteoarthritis, unspecified site: Secondary | ICD-10-CM | POA: Diagnosis not present

## 2023-04-19 DIAGNOSIS — K219 Gastro-esophageal reflux disease without esophagitis: Secondary | ICD-10-CM | POA: Diagnosis not present

## 2023-04-19 DIAGNOSIS — N189 Chronic kidney disease, unspecified: Secondary | ICD-10-CM | POA: Diagnosis not present

## 2023-04-19 DIAGNOSIS — Z008 Encounter for other general examination: Secondary | ICD-10-CM | POA: Diagnosis not present

## 2023-04-19 DIAGNOSIS — E1142 Type 2 diabetes mellitus with diabetic polyneuropathy: Secondary | ICD-10-CM | POA: Diagnosis not present

## 2023-04-19 DIAGNOSIS — G43909 Migraine, unspecified, not intractable, without status migrainosus: Secondary | ICD-10-CM | POA: Diagnosis not present

## 2023-04-19 DIAGNOSIS — E785 Hyperlipidemia, unspecified: Secondary | ICD-10-CM | POA: Diagnosis not present

## 2023-04-19 DIAGNOSIS — I2510 Atherosclerotic heart disease of native coronary artery without angina pectoris: Secondary | ICD-10-CM | POA: Diagnosis not present

## 2023-04-19 DIAGNOSIS — F325 Major depressive disorder, single episode, in full remission: Secondary | ICD-10-CM | POA: Diagnosis not present

## 2023-04-27 ENCOUNTER — Other Ambulatory Visit: Payer: Self-pay | Admitting: Internal Medicine

## 2023-04-27 DIAGNOSIS — I10 Essential (primary) hypertension: Secondary | ICD-10-CM

## 2023-05-03 DIAGNOSIS — M25562 Pain in left knee: Secondary | ICD-10-CM | POA: Diagnosis not present

## 2023-05-03 DIAGNOSIS — Z79899 Other long term (current) drug therapy: Secondary | ICD-10-CM | POA: Diagnosis not present

## 2023-05-03 DIAGNOSIS — M47816 Spondylosis without myelopathy or radiculopathy, lumbar region: Secondary | ICD-10-CM | POA: Diagnosis not present

## 2023-05-03 DIAGNOSIS — G894 Chronic pain syndrome: Secondary | ICD-10-CM | POA: Diagnosis not present

## 2023-05-03 DIAGNOSIS — M545 Low back pain, unspecified: Secondary | ICD-10-CM | POA: Diagnosis not present

## 2023-05-07 DIAGNOSIS — Z79899 Other long term (current) drug therapy: Secondary | ICD-10-CM | POA: Diagnosis not present

## 2023-05-09 ENCOUNTER — Other Ambulatory Visit: Payer: Self-pay | Admitting: Internal Medicine

## 2023-05-09 DIAGNOSIS — E785 Hyperlipidemia, unspecified: Secondary | ICD-10-CM

## 2023-05-09 DIAGNOSIS — E1140 Type 2 diabetes mellitus with diabetic neuropathy, unspecified: Secondary | ICD-10-CM

## 2023-05-14 ENCOUNTER — Encounter: Payer: Self-pay | Admitting: "Endocrinology

## 2023-05-14 ENCOUNTER — Ambulatory Visit (INDEPENDENT_AMBULATORY_CARE_PROVIDER_SITE_OTHER): Payer: Medicare PPO | Admitting: "Endocrinology

## 2023-05-14 VITALS — BP 118/62 | HR 88 | Ht 63.0 in | Wt 211.8 lb

## 2023-05-14 DIAGNOSIS — Z794 Long term (current) use of insulin: Secondary | ICD-10-CM | POA: Diagnosis not present

## 2023-05-14 DIAGNOSIS — I10 Essential (primary) hypertension: Secondary | ICD-10-CM | POA: Diagnosis not present

## 2023-05-14 DIAGNOSIS — E782 Mixed hyperlipidemia: Secondary | ICD-10-CM

## 2023-05-14 DIAGNOSIS — E1140 Type 2 diabetes mellitus with diabetic neuropathy, unspecified: Secondary | ICD-10-CM | POA: Diagnosis not present

## 2023-05-14 LAB — POCT GLYCOSYLATED HEMOGLOBIN (HGB A1C): HbA1c, POC (controlled diabetic range): 5.5 % (ref 0.0–7.0)

## 2023-05-14 MED ORDER — FREESTYLE LIBRE 3 READER DEVI: 1.0000 | Freq: Once | 0 refills | Status: DC | PRN

## 2023-05-14 MED ORDER — FREESTYLE LIBRE 3 PLUS SENSOR MISC: 2 refills | Status: AC

## 2023-05-14 NOTE — Progress Notes (Signed)
05/14/2023, 11:36 AM   Endocrinology follow-up note  Subjective:    Patient ID: Stephanie Hammond, female    DOB: 1951-07-24.  Stephanie Hammond is being seen in follow-up after she was seen in consultation for management of currently uncontrolled symptomatic diabetes requested by  Anabel Halon, MD.   Past Medical History:  Diagnosis Date   Anemia    Blood transfusion 2009   after GI bleed   CAD (coronary artery disease)    Diabetes mellitus    2   Diverticulitis    Diverticulosis    Fear of local anesthetic    01-02-2019, patient states with last surgery in 2016 , she refused to have epidural before she was sedated d/t to extreme aversion to needles near her back     GERD (gastroesophageal reflux disease)    Gout    Headache    occ if sugar is  too hi or low    Heart murmur    "i was told this years and years and years ago "   History of GI diverticular bleed 08/2004   Hypertension    IBS (irritable bowel syndrome)    Osteoarthritis    Tachycardia    "it stays like that for years between 119 and 121 and it doesnt bother me  none "     Past Surgical History:  Procedure Laterality Date   CHOLECYSTECTOMY  2003   COLON SURGERY     COLONOSCOPY  2000   Dr. Russella Dar: marked diverticulosis, difficult procedure due to adhesions, polyps benign   COLONOSCOPY  2006   Dr. Marina Goodell: severe pandiverticulosis   COLONOSCOPY N/A 02/03/2013   SLF: 4 COLORECTAL POLYPS REMOVED/Moderate diverticulosis throughout the entire examined colon/Small internal hemorrhoids   COLONOSCOPY WITH PROPOFOL N/A 04/29/2019   Procedure: COLONOSCOPY WITH PROPOFOL;  Surgeon: West Bali, MD;  Location: [facility];  Service: Endoscopy;  Laterality: N/A;  8:30am   JOINT REPLACEMENT Right 2009   Knee Replacement Right    POLYPECTOMY  04/29/2019   Procedure: POLYPECTOMY;  Surgeon: West Bali, MD;  Location: [facility];  Service: Endoscopy;;  colon   PTCA     over 10 years ago , unsure of exact year , does recall that no stents were placed    REPLACEMENT TOTAL KNEE     right    TOTAL KNEE ARTHROPLASTY Left 03/12/2015   Procedure: LEFT TOTAL KNEE REPLACEMENT;  Surgeon: Vickki Hearing, MD;  Location: [facility];  Service: Orthopedics;  Laterality: Left;   TOTAL KNEE REVISION Left 01/09/2019   Procedure: TOTAL KNEE REVISION;  Surgeon: Samson Frederic, MD;  Location: [facility];  Service: Orthopedics;  Laterality: Left;   VAGINAL HYSTERECTOMY      Social History   Socioeconomic History   Marital status: Married    Spouse name: Not on file   Number of children: 2   Years of education: Not on file   Highest education level: Not on file  Occupational History   Not on file  Tobacco Use   Smoking status: Every Day  Current packs/day: 1.00    Average packs/day: 1 pack/day for 35.0 years (35.0 ttl pk-yrs)    Types: Cigarettes    Passive exposure: Current   Smokeless tobacco: Never  Vaping Use   Vaping status: Never Used  Substance and Sexual Activity   Alcohol use: No   Drug use: No   Sexual activity: Not Currently    Birth control/protection: Surgical    Comment: hyst  Other Topics Concern   Not on file  Social History Narrative   Husband passed away lives alone    Social Determinants of Health   Financial Resource Strain: Low Risk  (03/03/2022)   Overall Financial Resource Strain (CARDIA)    Difficulty of Paying Living Expenses: Not hard at all  Food Insecurity: No Food Insecurity (03/03/2022)   Hunger Vital Sign    Worried About Running Out of Food in the Last Year: Never true    Ran Out of Food in the Last Year: Never true  Transportation Needs: No Transportation Needs (03/03/2022)   PRAPARE - Administrator, Civil Service (Medical): No    Lack of Transportation (Non-Medical): No  Physical Activity: Inactive (03/03/2022)   Exercise Vital Sign    Days of Exercise per Week: 0  days    Minutes of Exercise per Session: 0 min  Stress: No Stress Concern Present (03/03/2022)   [employer] of Occupational Health - Occupational Stress Questionnaire    Feeling of Stress : Not at all  Social Connections: Moderately Isolated (03/03/2022)   Social Connection and Isolation Panel [NHANES]    Frequency of Communication with Friends and Family: More than three times a week    Frequency of Social Gatherings with Friends and Family: More than three times a week    Attends Religious Services: More than 4 times per year    Active Member of [employer] or Organizations: No    Attends Banker Meetings: Never    Marital Status: Widowed    Family History  Problem Relation Age of Onset   Colon polyps Sister    Cancer Mother        lung    Diabetes Mother    Cancer Father        stomach    Stomach cancer Father        questionable   Colon cancer Neg Hx     Outpatient Encounter Medications as of 05/14/2023  Medication Sig   Continuous Glucose Receiver (FREESTYLE LIBRE 3 READER) DEVI 1 Piece by Does not apply route once as needed for up to 1 dose.   Continuous Glucose Sensor (FREESTYLE LIBRE 3 PLUS SENSOR) MISC Change sensor every 15 days.   [DISCONTINUED] insulin aspart (NOVOLOG FLEXPEN) 100 UNIT/ML FlexPen Hold for now.   amitriptyline (ELAVIL) 10 MG tablet Take 1 tablet (10 mg total) by mouth at bedtime. (Patient not taking: Reported on 03/22/2023)   amLODipine (NORVASC) 10 MG tablet TAKE ONE TABLET BY MOUTH ONCE DAILY   atorvastatin (LIPITOR) 20 MG tablet TAKE ONE TABLET BY MOUTH EVERY DAY   blood glucose meter kit and supplies Dispense based on patient and insurance preference. Use up to four times daily as directed. (FOR ICD-10 E10.9, E11.9).   BUTRANS 15 MCG/HR Place 1 patch onto the skin once a week.   cetirizine (ZYRTEC) 10 MG tablet Take 1 tablet (10 mg total) by mouth daily. (Patient not taking: Reported on 03/22/2023)   empagliflozin (JARDIANCE) 10 MG TABS  tablet Take 1 tablet (10  mg total) by mouth daily before breakfast.   fluticasone (FLONASE) 50 MCG/ACT nasal spray Place 1 spray into both nostrils daily as needed for allergies or rhinitis. (Patient not taking: Reported on 03/22/2023)   Glucose Blood (BLOOD GLUCOSE TEST STRIPS) STRP Please dispense based on patient and insurance preference. Use as directed to monitor FSBS 3x daily. Dx: E11.9  For testing 4 times daily   Insulin Glargine (BASAGLAR KWIKPEN) 100 UNIT/ML Inject 25 Units into the skin at bedtime. (Patient taking differently: 30 Units at bedtime.)   Insulin Pen Needle (BD PEN NEEDLE NANO U/F) 32G X 4 MM MISC USE AS DIRECTED TO INJECT INSULIN FOUR TIMES DAILY   Lancets MISC Please dispense based on patient and insurance preference. Use as directed to monitor FSBS 3x daily. Dx: E11.9  For testing 4 times daily   losartan (COZAAR) 100 MG tablet Take 1 tablet (100 mg total) by mouth daily.   metFORMIN (GLUCOPHAGE-XR) 500 MG 24 hr tablet TAKE 2 TABLETS(1000 MG) BY MOUTH DAILY WITH BREAKFAST   omeprazole (PRILOSEC) 20 MG capsule TAKE ONE CAPSULE BY MOUTH ONCE DAILY 30 MINUTES BEFORE morning MEAL   oxyCODONE-acetaminophen (PERCOCET) 10-325 MG tablet Take 1 tablet by mouth 2 (two) times daily.   Semaglutide, 2 MG/DOSE, (OZEMPIC, 2 MG/DOSE,) 8 MG/3ML SOPN Inject 2 mg into the skin every 7 (seven) days.   Vitamin D, Ergocalciferol, (DRISDOL) 1.25 MG (50000 UNIT) CAPS capsule TAKE ONE CAPSULE BY MOUTH ONCE A WEEK IN THE MORNING   No facility-administered encounter medications on file as of 05/14/2023.    ALLERGIES: Allergies  Allergen Reactions   Ace Inhibitors Cough   Bentyl [Dicyclomine Hcl] Other (See Comments)    Blurry vision   Penicillins Itching and Swelling    Whelps Did it involve swelling of the face/tongue/throat, SOB, or low BP? No Did it involve sudden or severe rash/hives, skin peeling, or any reaction on the inside of your mouth or nose? No Did you need to seek medical  attention at a hospital or doctor's office? No When did it last happen?      20 years If all above answers are "NO", may proceed with cephalosporin use.     VACCINATION STATUS: Immunization History  Administered Date(s) Administered   Moderna Sars-Covid-2 Vaccination 10/01/2019, 10/29/2019, 07/22/2020   PPD Test 09/03/2017, 06/08/2021   Zoster Recombinant(Shingrix) 11/07/2017, 11/07/2017    Diabetes She presents for her follow-up diabetic visit. She has type 2 diabetes mellitus. Onset time: She was diagnosed at approximate age of 69 years. Her disease course has been fluctuating. There are no hypoglycemic associated symptoms. Pertinent negatives for hypoglycemia include no confusion, headaches, pallor or seizures. Associated symptoms include fatigue. Pertinent negatives for diabetes include no chest pain, no polydipsia, no polyphagia and no polyuria. There are no hypoglycemic complications. Symptoms are improving. There are no diabetic complications. Risk factors for coronary artery disease include diabetes mellitus, dyslipidemia, hypertension, family history, sedentary lifestyle, post-menopausal and tobacco exposure. Current diabetic treatment includes insulin injections and oral agent (monotherapy). Her weight is stable. She is following a generally unhealthy diet. When asked about meal planning, she reported none. She has not had a previous visit with a dietitian. Her home blood glucose trend is fluctuating minimally. Her breakfast blood glucose range is generally >200 mg/dl. Her lunch blood glucose range is generally >200 mg/dl. Her dinner blood glucose range is generally >200 mg/dl. Her bedtime blood glucose range is generally >200 mg/dl. Her overall blood glucose range is >200 mg/dl. (She presents  with her meter showing average blood glucose ranging between 204-210 over the last 30 days.  She continues to have discordant EAG and A1c.  Her point-of-care A1c today is 5.5% which does not agree with  her average blood glucose.  She did not document or report hypoglycemia.  Her recent CBC shows a hemoglobin of 13.6 and hematocrit of 42.3.) An ACE inhibitor/angiotensin II receptor blocker is being taken. Eye exam is current.  Hyperlipidemia This is a chronic problem. The current episode started more than 1 year ago. The problem is controlled. Exacerbating diseases include diabetes and obesity. Pertinent negatives include no chest pain, myalgias or shortness of breath. Current antihyperlipidemic treatment includes statins. Risk factors for coronary artery disease include dyslipidemia, diabetes mellitus, hypertension, a sedentary lifestyle, post-menopausal, obesity and family history.  Hypertension This is a chronic problem. Pertinent negatives include no chest pain, headaches, palpitations or shortness of breath. Risk factors for coronary artery disease include dyslipidemia, diabetes mellitus, obesity, sedentary lifestyle, smoking/tobacco exposure, family history and post-menopausal state. Past treatments include angiotensin blockers.     Review of Systems  Constitutional:  Positive for fatigue. Negative for chills, fever and unexpected weight change.  HENT:  Negative for trouble swallowing and voice change.   Eyes:  Negative for visual disturbance.  Respiratory:  Negative for cough, shortness of breath and wheezing.   Cardiovascular:  Negative for chest pain, palpitations and leg swelling.  Gastrointestinal:  Negative for diarrhea, nausea and vomiting.  Endocrine: Negative for cold intolerance, heat intolerance, polydipsia, polyphagia and polyuria.  Musculoskeletal:  Negative for arthralgias and myalgias.  Skin:  Negative for color change, pallor, rash and wound.  Neurological:  Negative for seizures and headaches.  Psychiatric/Behavioral:  Negative for confusion and suicidal ideas.     Objective:       05/14/2023   10:03 AM 03/22/2023    2:46 PM 01/29/2023    8:24 AM  Vitals with BMI   Height 5\' 3"  5\' 3"  5\' 3"   Weight 211 lbs 13 oz 211 lbs 13 oz 208 lbs 6 oz  BMI 37.53 37.53 36.93  Systolic 118 112 062  Diastolic 62 58 73  Pulse 88 104 106    BP 118/62   Pulse 88   Ht 5\' 3"  (1.6 m)   Wt 211 lb 12.8 oz (96.1 kg)   BMI 37.52 kg/m   Wt Readings from Last 3 Encounters:  05/14/23 211 lb 12.8 oz (96.1 kg)  03/22/23 211 lb 12.8 oz (96.1 kg)  01/29/23 208 lb 6.4 oz (94.5 kg)     Physical Exam Constitutional:      Appearance: She is well-developed.  HENT:     Head: Normocephalic and atraumatic.  Neck:     Thyroid: No thyromegaly.     Trachea: No tracheal deviation.  Cardiovascular:     Rate and Rhythm: Normal rate and regular rhythm.  Pulmonary:     Effort: Pulmonary effort is normal.  Abdominal:     Tenderness: There is no abdominal tenderness. There is no guarding.  Musculoskeletal:        General: Normal range of motion.     Cervical back: Normal range of motion and neck supple.     Comments: Her foot exam is normal.  Skin:    General: Skin is warm and dry.     Coloration: Skin is not pale.     Findings: No erythema or rash.  Neurological:     Mental Status: She is alert and oriented to  person, place, and time.     Cranial Nerves: No cranial nerve deficit.     Coordination: Coordination normal.     Deep Tendon Reflexes: Reflexes are normal and symmetric.  Psychiatric:        Judgment: Judgment normal.    CMP ( most recent) CMP     Component Value Date/Time   NA 141 01/29/2023 0925   K 6.1 (H) 01/29/2023 0925   CL 104 01/29/2023 0925   CO2 21 01/29/2023 0925   GLUCOSE 125 (H) 01/29/2023 0925   GLUCOSE CANCELED 10/04/2020 1301   GLUCOSE 349 (H) 10/06/2015 1436   BUN 20 01/29/2023 0925   CREATININE 0.86 01/29/2023 0925   CREATININE 0.87 03/19/2020 1127   CALCIUM 10.4 (H) 01/29/2023 0925   PROT 7.2 01/29/2023 0925   ALBUMIN 4.2 01/29/2023 0925   AST 20 01/29/2023 0925   ALT 17 01/29/2023 0925   ALKPHOS 131 (H) 01/29/2023 0925   BILITOT  0.3 01/29/2023 0925   GFRNONAA >60 04/25/2019 1032   GFRNONAA 93 06/14/2017 0814   GFRAA >60 04/25/2019 1032   GFRAA 108 06/14/2017 0814     Diabetic Labs (most recent): Lab Results  Component Value Date   HGBA1C 5.5 05/14/2023   HGBA1C 5.9 (H) 01/29/2023   HGBA1C 5.6 09/18/2022   MICROALBUR 0.9 10/04/2020   MICROALBUR 0.5 09/13/2018   MICROALBUR <0.2 06/14/2017     Lipid Panel ( most recent) Lipid Panel     Component Value Date/Time   CHOL 110 01/29/2023 0920   TRIG 61 01/29/2023 0920   HDL 55 01/29/2023 0920   CHOLHDL 2.0 01/29/2023 0920   CHOLHDL 2.5 03/19/2020 1127   VLDL 27 09/11/2016 1255   LDLCALC 41 01/29/2023 0920   LDLCALC CANCELED 10/04/2020 1301   LDLDIRECT 68 12/01/2013 1454   LABVLDL 14 01/29/2023 0920      Lab Results  Component Value Date   TSH 1.180 01/29/2023   TSH 0.987 05/01/2022   TSH 0.588 05/04/2021   TSH 0.75 09/10/2017   TSH 1.384 05/27/2012      Assessment & Plan:   1. Type 2 diabetes mellitus with diabetic neuropathy, with long-term current use of insulin (HCC)  - Stephanie Hammond has currently uncontrolled symptomatic type 2 DM since  72 years of age.   She presents with her meter showing average blood glucose ranging between 204-210 over the last 30 days.  She continues to have discordant EAG and A1c.  Her point-of-care A1c today is 5.5% which does not agree with her average blood glucose.  She did not document or report hypoglycemia.  Her recent CBC shows a hemoglobin of 13.6 and hematocrit of 42.3.  -She has a habit of not showing up for appointments for proper care.  She no-showed since September 2021.    - I had a long discussion with her about the progressive nature of diabetes and the pathology behind its complications. -her diabetes is complicated by chronic smoking, comorbid hypertension, hyperlipidemia obesity/sedentary/and she remains at a high risk for more acute and chronic complications which include CAD, CVA, CKD,  retinopathy, and neuropathy. These are all discussed in detail with her.  - I have counseled her on diet  and weight management  by adopting a carbohydrate restricted/protein rich diet. Patient is encouraged to switch to  unprocessed or minimally processed    complex starch and increased protein intake (animal or plant source), fruits, and vegetables. -  she is advised to stick to a routine mealtimes  to eat 3 meals  a day and avoid unnecessary snacks ( to snack only to correct hypoglycemia).   - she  admits there is a room for improvement in her diet and drink choices. -  Suggestion is made for her to avoid simple carbohydrates  from her diet including Cakes, Sweet Desserts / Pastries, Ice Cream, Soda (diet and regular), Sweet Tea, Candies, Chips, Cookies, Sweet Pastries,  Store Bought Juices, Alcohol in Excess of  1-2 drinks a day, Artificial Sweeteners, Coffee Creamer, and "Sugar-free" Products. This will help patient to have stable blood glucose profile and potentially avoid unintended weight gain.   - she will be scheduled with Norm Salt, RDN, CDE for diabetes education.  - I have approached her with the following individualized plan to manage  her diabetes and patient agrees:   -She continues to have discordant findings between her EAG and A1c.  Her point-of-care A1c is 5.5% today however her average blood glucose on her meter is between 204 and 210 mg per DL over the last 30 days.   -She was previously approached for a CGM, was hesitant.  She is open for the device now.  I discussed and prescribed a freestyle libre device for her. -In the meantime, I advised her to continue Basaglar 30 units nightly, continue Ozempic 2 mg subcutaneously weekly.  She is advised to discontinue NovoLog until next visit.  She is also advised to continue metformin 1000 mg p.o. once a day and Jardiance 10 mg p.o. daily at breakfast.   - Specific targets for  A1c;  LDL, HDL,  and Triglycerides were discussed  with the patient.  2) Blood Pressure /Hypertension:  -Her blood pressure is controlled to target.   she is advised to continue her current medications including losartan 50 mg p.o. daily with breakfast . 3) Lipids/Hyperlipidemia:   Review of her recent lipid panel showed  controlled  LDL at 68 .  she  is advised to continue atorvastatin 20 mg p.o. daily at bedtime.    Side effects and precautions discussed with her.  4)  Weight/Diet:  Body mass index is 37.52 kg/m.  -   clearly complicating her diabetes care.   she is  a candidate for weight loss. I discussed with her the fact that loss of 5 - 10% of her  current body weight will have the most impact on her diabetes management.  Exercise, and detailed carbohydrates information provided  -  detailed on discharge instructions.  5) Chronic Care/Health Maintenance:  -she  is on ACEI/ARB and Statin medications and  is encouraged to initiate and continue to follow up with Ophthalmology, Dentist,  Podiatrist at least yearly or according to recommendations, and advised to   stay away from smoking. I have recommended yearly flu vaccine and pneumonia vaccine at least every 5 years; moderate intensity exercise for up to 150 minutes weekly; and  sleep for at least 7 hours a day.  The patient was counseled on the dangers of tobacco use, and was advised to quit.  Reviewed strategies to maximize success, including removing cigarettes and smoking materials from environment.   - she is  advised to maintain close follow up with Anabel Halon, MD for primary care needs, as well as her other providers for optimal and coordinated care.   I spent  26  minutes in the care of the patient today including review of labs from CMP, Lipids, Thyroid Function, Hematology (current and previous including abstractions from other facilities);  face-to-face time discussing  her blood glucose readings/logs, discussing hypoglycemia and hyperglycemia episodes and symptoms,  medications doses, her options of short and long term treatment based on the latest standards of care / guidelines;  discussion about incorporating lifestyle medicine;  and documenting the encounter. Risk reduction counseling performed per USPSTF guidelines to reduce  obesity and cardiovascular risk factors.     Please refer to Patient Instructions for Blood Glucose Monitoring and Insulin/Medications Dosing Guide"  in media tab for additional information. Please  also refer to " Patient Self Inventory" in the Media  tab for reviewed elements of pertinent patient history.  Stephanie Hammond participated in the discussions, expressed understanding, and voiced agreement with the above plans.  All questions were answered to her satisfaction. she is encouraged to contact clinic should she have any questions or concerns prior to her return visit.   Follow up plan: - Return in about 4 months (around 09/14/2023) for F/U with Pre-visit Labs, Meter/CGM/Logs, A1c here.  Marquis Lunch, MD [facility] [address] [place], [place] [address] Phone: [phone]  Fax: [phone]    05/14/2023, 11:36 AM  This note was partially dictated with voice recognition software. Similar sounding words can be transcribed inadequately or may not  be corrected upon review.

## 2023-05-14 NOTE — Patient Instructions (Signed)

## 2023-05-16 ENCOUNTER — Telehealth: Payer: Self-pay | Admitting: Internal Medicine

## 2023-05-16 ENCOUNTER — Other Ambulatory Visit: Payer: Self-pay

## 2023-05-16 DIAGNOSIS — E1140 Type 2 diabetes mellitus with diabetic neuropathy, unspecified: Secondary | ICD-10-CM

## 2023-05-16 NOTE — Telephone Encounter (Signed)
Patient called referral to diabetic  doctor in [facility] Dr Talmage Nap on [facility] in [place] fax # [phone]. She feels like not getting the right results and not happy with Dr Fransico Him.

## 2023-05-18 ENCOUNTER — Other Ambulatory Visit (HOSPITAL_COMMUNITY): Payer: Self-pay

## 2023-05-18 ENCOUNTER — Telehealth: Payer: Self-pay

## 2023-05-18 NOTE — Telephone Encounter (Signed)
Notified by pharmacy pt's Rx for Libre 3 CGM system needs prior authorization.

## 2023-05-18 NOTE — Telephone Encounter (Signed)
Pharmacy Patient Advocate Encounter  Received notification from [facility] that Prior Authorization for [facility] 3 plus sensor  has been APPROVED through 08/05/2024   PA #/Case ID/Reference #: [id number]

## 2023-05-18 NOTE — Telephone Encounter (Signed)
Pharmacy Patient Advocate Encounter   Received notification from Pt Calls Messages that prior authorization for Freestyle libre 3 plus sensor is required/requested.   Per test claim: PA required; PA submitted to [facility] via CoverMyMeds Key/confirmation #/EOC  [id number] Status is pending

## 2023-05-21 ENCOUNTER — Other Ambulatory Visit (HOSPITAL_COMMUNITY): Payer: Self-pay | Admitting: Internal Medicine

## 2023-05-21 ENCOUNTER — Other Ambulatory Visit: Payer: Self-pay

## 2023-05-21 DIAGNOSIS — E1140 Type 2 diabetes mellitus with diabetic neuropathy, unspecified: Secondary | ICD-10-CM

## 2023-05-21 DIAGNOSIS — Z1231 Encounter for screening mammogram for malignant neoplasm of breast: Secondary | ICD-10-CM

## 2023-05-21 MED ORDER — BLOOD GLUCOSE TEST VI STRP: ORAL_STRIP | 2 refills | Status: DC

## 2023-05-21 NOTE — Telephone Encounter (Signed)
Pt made aware

## 2023-05-24 ENCOUNTER — Telehealth: Payer: Self-pay | Admitting: Nurse Practitioner

## 2023-05-24 MED ORDER — ACCU-CHEK GUIDE W/DEVICE KIT: PACK | 0 refills | Status: DC

## 2023-05-24 NOTE — Telephone Encounter (Signed)
Rx sent 

## 2023-05-24 NOTE — Telephone Encounter (Signed)
[facility] said they need a accu chek meter called in to go with the strips you sent in. Thanks

## 2023-05-28 DIAGNOSIS — Z96652 Presence of left artificial knee joint: Secondary | ICD-10-CM | POA: Diagnosis not present

## 2023-05-28 DIAGNOSIS — Z79899 Other long term (current) drug therapy: Secondary | ICD-10-CM | POA: Diagnosis not present

## 2023-05-28 DIAGNOSIS — G894 Chronic pain syndrome: Secondary | ICD-10-CM | POA: Diagnosis not present

## 2023-05-28 DIAGNOSIS — M545 Low back pain, unspecified: Secondary | ICD-10-CM | POA: Diagnosis not present

## 2023-05-28 DIAGNOSIS — M7052 Other bursitis of knee, left knee: Secondary | ICD-10-CM | POA: Diagnosis not present

## 2023-05-28 DIAGNOSIS — M25562 Pain in left knee: Secondary | ICD-10-CM | POA: Diagnosis not present

## 2023-05-28 DIAGNOSIS — M47816 Spondylosis without myelopathy or radiculopathy, lumbar region: Secondary | ICD-10-CM | POA: Diagnosis not present

## 2023-05-31 ENCOUNTER — Ambulatory Visit: Payer: Medicare PPO | Admitting: Internal Medicine

## 2023-05-31 DIAGNOSIS — Z79899 Other long term (current) drug therapy: Secondary | ICD-10-CM | POA: Diagnosis not present

## 2023-06-11 ENCOUNTER — Encounter: Payer: Self-pay | Admitting: Internal Medicine

## 2023-06-11 ENCOUNTER — Ambulatory Visit (INDEPENDENT_AMBULATORY_CARE_PROVIDER_SITE_OTHER): Payer: Medicare PPO | Admitting: Internal Medicine

## 2023-06-11 ENCOUNTER — Ambulatory Visit (HOSPITAL_COMMUNITY): Payer: Medicare HMO

## 2023-06-11 VITALS — BP 132/64 | HR 90 | Ht 63.0 in | Wt 212.8 lb

## 2023-06-11 DIAGNOSIS — Z794 Long term (current) use of insulin: Secondary | ICD-10-CM | POA: Diagnosis not present

## 2023-06-11 DIAGNOSIS — I10 Essential (primary) hypertension: Secondary | ICD-10-CM

## 2023-06-11 DIAGNOSIS — E782 Mixed hyperlipidemia: Secondary | ICD-10-CM | POA: Diagnosis not present

## 2023-06-11 DIAGNOSIS — I2510 Atherosclerotic heart disease of native coronary artery without angina pectoris: Secondary | ICD-10-CM | POA: Diagnosis not present

## 2023-06-11 DIAGNOSIS — E1140 Type 2 diabetes mellitus with diabetic neuropathy, unspecified: Secondary | ICD-10-CM

## 2023-06-11 DIAGNOSIS — F17200 Nicotine dependence, unspecified, uncomplicated: Secondary | ICD-10-CM

## 2023-06-11 DIAGNOSIS — F17210 Nicotine dependence, cigarettes, uncomplicated: Secondary | ICD-10-CM

## 2023-06-11 DIAGNOSIS — F5101 Primary insomnia: Secondary | ICD-10-CM

## 2023-06-11 MED ORDER — AMITRIPTYLINE HCL 10 MG PO TABS
10.0000 mg | ORAL_TABLET | Freq: Every day | ORAL | 3 refills | Status: DC
Start: 2023-06-11 — End: 2023-11-30

## 2023-06-11 NOTE — Progress Notes (Signed)
Established Patient Office Visit  Subjective:  Patient ID: Stephanie Hammond, female    DOB: [birth year]  Age: 72 y.o. MRN: [id number]  CC:  Chief Complaint  Patient presents with   Insomnia    Patient states she does not sleep at all     HPI Stephanie Hammond is a 72 y.o. female with past medical history of HTN, HLD, type 2 DM, diverticulitis, s/p partial colectomy, chronic pain syndrome, gout, obesity and tobacco abuse who presents for f/u of her chronic medical conditions.  HTN: BP is well-controlled. Takes medications regularly. Patient denies dizziness, chest pain, dyspnea or palpitations.  Type II DM: Her HbA1c was 5.5 in 10/24. She has tolerated Ozempic well. She takes Basaglar 30 units at nighttime. She takes mealtime insulin per sliding scale.  she has noticed blood glucose readings in 200s ??. She denies any episode of hypoglycemia recently. She has tried CGM in the past, but did not like it. She is going to see new Endocrinology provider in [place].  Insomnia, chronic fatigue and headache:   She has difficulty maintaining sleep and wakes up at 3 AM at times.  She also reports headache, which is generalized, dull and nonradiating.  Denies any dizziness or visual disturbance currently.  She has not tried Elavil yet.   Past Medical History:  Diagnosis Date   Anemia    Blood transfusion 2009   after GI bleed   CAD (coronary artery disease)    Diabetes mellitus    2   Diverticulitis    Diverticulosis    Fear of local anesthetic    01-02-2019, patient states with last surgery in 2016 , she refused to have epidural before she was sedated d/t to extreme aversion to needles near her back     GERD (gastroesophageal reflux disease)    Gout    Headache    occ if sugar is  too hi or low    Heart murmur    "i was told this years and years and years ago "   History of GI diverticular bleed 08/2004   Hypertension    IBS (irritable bowel syndrome)    Osteoarthritis     Tachycardia    "it stays like that for years between 119 and 121 and it doesnt bother me  none "     Past Surgical History:  Procedure Laterality Date   CHOLECYSTECTOMY  2003   COLON SURGERY     COLONOSCOPY  2000   Dr. Russella Dar: marked diverticulosis, difficult procedure due to adhesions, polyps benign   COLONOSCOPY  2006   Dr. Marina Goodell: severe pandiverticulosis   COLONOSCOPY N/A 02/03/2013   SLF: 4 COLORECTAL POLYPS REMOVED/Moderate diverticulosis throughout the entire examined colon/Small internal hemorrhoids   COLONOSCOPY WITH PROPOFOL N/A 04/29/2019   Procedure: COLONOSCOPY WITH PROPOFOL;  Surgeon: West Bali, MD;  Location: [facility];  Service: Endoscopy;  Laterality: N/A;  8:30am   JOINT REPLACEMENT Right 2009   Knee Replacement Right    POLYPECTOMY  04/29/2019   Procedure: POLYPECTOMY;  Surgeon: West Bali, MD;  Location: [facility];  Service: Endoscopy;;  colon   PTCA     over 10 years ago , unsure of exact year , does recall that no stents were placed    REPLACEMENT TOTAL KNEE     right    TOTAL KNEE ARTHROPLASTY Left 03/12/2015   Procedure: LEFT TOTAL KNEE REPLACEMENT;  Surgeon: Vickki Hearing, MD;  Location: [facility];  Service: Orthopedics;  Laterality: Left;   TOTAL KNEE REVISION Left 01/09/2019   Procedure: TOTAL KNEE REVISION;  Surgeon: Samson Frederic, MD;  Location: [facility];  Service: Orthopedics;  Laterality: Left;   VAGINAL HYSTERECTOMY      Family History  Problem Relation Age of Onset   Colon polyps Sister    Cancer Mother        lung    Diabetes Mother    Cancer Father        stomach    Stomach cancer Father        questionable   Colon cancer Neg Hx     Social History   Socioeconomic History   Marital status: Married    Spouse name: Not on file   Number of children: 2   Years of education: Not on file   Highest education level: Not on file  Occupational History   Not on file  Tobacco Use   Smoking status: Every Day    Current  packs/day: 1.00    Average packs/day: 1 pack/day for 35.0 years (35.0 ttl pk-yrs)    Types: Cigarettes    Passive exposure: Current   Smokeless tobacco: Never  Vaping Use   Vaping status: Never Used  Substance and Sexual Activity   Alcohol use: No   Drug use: No   Sexual activity: Not Currently    Birth control/protection: Surgical    Comment: hyst  Other Topics Concern   Not on file  Social History Narrative   Husband passed away lives alone    Social Determinants of Health   Financial Resource Strain: Low Risk  (03/03/2022)   Overall Financial Resource Strain (CARDIA)    Difficulty of Paying Living Expenses: Not hard at all  Food Insecurity: No Food Insecurity (03/03/2022)   Hunger Vital Sign    Worried About Running Out of Food in the Last Year: Never true    Ran Out of Food in the Last Year: Never true  Transportation Needs: No Transportation Needs (03/03/2022)   PRAPARE - Administrator, Civil Service (Medical): No    Lack of Transportation (Non-Medical): No  Physical Activity: Inactive (03/03/2022)   Exercise Vital Sign    Days of Exercise per Week: 0 days    Minutes of Exercise per Session: 0 min  Stress: No Stress Concern Present (03/03/2022)   [employer] of Occupational Health - Occupational Stress Questionnaire    Feeling of Stress : Not at all  Social Connections: Moderately Isolated (03/03/2022)   Social Connection and Isolation Panel [NHANES]    Frequency of Communication with Friends and Family: More than three times a week    Frequency of Social Gatherings with Friends and Family: More than three times a week    Attends Religious Services: More than 4 times per year    Active Member of [employer] or Organizations: No    Attends Banker Meetings: Never    Marital Status: Widowed  Intimate Partner Violence: Not At Risk (03/03/2022)   Humiliation, Afraid, Rape, and Kick questionnaire    Fear of Current or Ex-Partner: No    Emotionally  Abused: No    Physically Abused: No    Sexually Abused: No    Outpatient Medications Prior to Visit  Medication Sig Dispense Refill   amLODipine (NORVASC) 10 MG tablet TAKE ONE TABLET BY MOUTH ONCE DAILY 90 tablet 1   atorvastatin (LIPITOR) 20 MG tablet TAKE ONE TABLET BY MOUTH EVERY DAY 90 tablet  1   blood glucose meter kit and supplies Dispense based on patient and insurance preference. Use up to four times daily as directed. (FOR ICD-10 E10.9, E11.9). 1 each 0   Blood Glucose Monitoring Suppl (ACCU-CHEK GUIDE) w/Device KIT Use to test BG bid. Dx E11.65 1 kit 0   BUTRANS 15 MCG/HR Place 1 patch onto the skin once a week.     cetirizine (ZYRTEC) 10 MG tablet Take 1 tablet (10 mg total) by mouth daily. (Patient not taking: Reported on 03/22/2023) 30 tablet 0   Continuous Glucose Receiver (FREESTYLE LIBRE 3 READER) DEVI 1 Piece by Does not apply route once as needed for up to 1 dose. 1 each 0   Continuous Glucose Sensor (FREESTYLE LIBRE 3 PLUS SENSOR) MISC Change sensor every 15 days. 2 each 2   empagliflozin (JARDIANCE) 10 MG TABS tablet Take 1 tablet (10 mg total) by mouth daily before breakfast. 90 tablet 1   fluticasone (FLONASE) 50 MCG/ACT nasal spray Place 1 spray into both nostrils daily as needed for allergies or rhinitis. (Patient not taking: Reported on 03/22/2023) 15.8 mL 0   Glucose Blood (BLOOD GLUCOSE TEST STRIPS) STRP Please dispense based on patient and insurance preference. Use as directed to monitor blood glucose twice daily. 200 strip 2   Insulin Glargine (BASAGLAR KWIKPEN) 100 UNIT/ML Inject 25 Units into the skin at bedtime. (Patient taking differently: 30 Units at bedtime.) 15 mL 3   Insulin Pen Needle (BD PEN NEEDLE NANO U/F) 32G X 4 MM MISC USE AS DIRECTED TO INJECT INSULIN FOUR TIMES DAILY 100 each 2   Lancets MISC Please dispense based on patient and insurance preference. Use as directed to monitor FSBS 3x daily. Dx: E11.9  For testing 4 times daily 100 each 11    losartan (COZAAR) 100 MG tablet Take 1 tablet (100 mg total) by mouth daily. 90 tablet 1   metFORMIN (GLUCOPHAGE-XR) 500 MG 24 hr tablet TAKE 2 TABLETS(1000 MG) BY MOUTH DAILY WITH BREAKFAST 180 tablet 2   omeprazole (PRILOSEC) 20 MG capsule TAKE ONE CAPSULE BY MOUTH ONCE DAILY 30 MINUTES BEFORE morning MEAL 30 capsule 11   oxyCODONE-acetaminophen (PERCOCET) 10-325 MG tablet Take 1 tablet by mouth 2 (two) times daily.     Semaglutide, 2 MG/DOSE, (OZEMPIC, 2 MG/DOSE,) 8 MG/3ML SOPN Inject 2 mg into the skin every 7 (seven) days. 3 mL 3   Vitamin D, Ergocalciferol, (DRISDOL) 1.25 MG (50000 UNIT) CAPS capsule TAKE ONE CAPSULE BY MOUTH ONCE A WEEK IN THE MORNING 12 capsule 2   amitriptyline (ELAVIL) 10 MG tablet Take 1 tablet (10 mg total) by mouth at bedtime. (Patient not taking: Reported on 03/22/2023) 30 tablet 3   No facility-administered medications prior to visit.    Allergies  Allergen Reactions   Ace Inhibitors Cough   Bentyl [Dicyclomine Hcl] Other (See Comments)    Blurry vision   Penicillins Itching and Swelling    Whelps Did it involve swelling of the face/tongue/throat, SOB, or low BP? No Did it involve sudden or severe rash/hives, skin peeling, or any reaction on the inside of your mouth or nose? No Did you need to seek medical attention at a hospital or doctor's office? No When did it last happen?      20 years If all above answers are "NO", may proceed with cephalosporin use.     ROS Review of Systems  Constitutional:  Negative for chills and fever.  HENT:  Negative for congestion, sinus pressure, sinus pain  and sore throat.   Eyes:  Negative for pain and discharge.  Respiratory:  Negative for cough and shortness of breath.   Cardiovascular:  Negative for chest pain and palpitations.  Gastrointestinal:  Negative for abdominal pain, constipation, diarrhea, nausea and vomiting.  Endocrine: Negative for polydipsia and polyuria.  Genitourinary:  Negative for dysuria and  hematuria.  Musculoskeletal:  Positive for arthralgias and back pain. Negative for neck pain and neck stiffness.  Skin:  Negative for rash.  Neurological:  Positive for numbness and headaches. Negative for dizziness and weakness.  Psychiatric/Behavioral:  Positive for sleep disturbance. Negative for agitation and behavioral problems.       Objective:    Physical Exam Vitals reviewed.  Constitutional:      General: She is not in acute distress.    Appearance: She is obese. She is not diaphoretic.  HENT:     Head: Normocephalic and atraumatic.     Nose: Nose normal.     Mouth/Throat:     Mouth: Mucous membranes are moist.  Eyes:     General: No scleral icterus.    Extraocular Movements: Extraocular movements intact.  Neck:     Vascular: No carotid bruit.  Cardiovascular:     Rate and Rhythm: Normal rate and regular rhythm.     Pulses: Normal pulses.     Heart sounds: Normal heart sounds. No murmur heard. Pulmonary:     Breath sounds: Normal breath sounds. No wheezing or rales.  Musculoskeletal:     Cervical back: Neck supple. No tenderness.     Right lower leg: No edema.     Left lower leg: No edema.     Comments: Tinel and Phalen sign positive on right side  Skin:    General: Skin is warm.     Findings: No rash.  Neurological:     General: No focal deficit present.     Mental Status: She is alert and oriented to person, place, and time.     Sensory: No sensory deficit.     Motor: No weakness.  Psychiatric:        Mood and Affect: Mood normal.        Behavior: Behavior normal.     BP 132/64 (BP Location: Right Arm)   Pulse 90   Ht 5\' 3"  (1.6 m)   Wt 212 lb 12.8 oz (96.5 kg)   SpO2 95%   BMI 37.70 kg/m  Wt Readings from Last 3 Encounters:  06/11/23 212 lb 12.8 oz (96.5 kg)  05/14/23 211 lb 12.8 oz (96.1 kg)  03/22/23 211 lb 12.8 oz (96.1 kg)    Lab Results  Component Value Date   TSH 1.180 01/29/2023   Lab Results  Component Value Date   WBC 5.9  01/29/2023   HGB 13.6 01/29/2023   HCT 42.3 01/29/2023   MCV 96 01/29/2023   PLT 362 01/29/2023   Lab Results  Component Value Date   NA 141 01/29/2023   K 6.1 (H) 01/29/2023   CO2 21 01/29/2023   GLUCOSE 125 (H) 01/29/2023   BUN 20 01/29/2023   CREATININE 0.86 01/29/2023   BILITOT 0.3 01/29/2023   ALKPHOS 131 (H) 01/29/2023   AST 20 01/29/2023   ALT 17 01/29/2023   PROT 7.2 01/29/2023   ALBUMIN 4.2 01/29/2023   CALCIUM 10.4 (H) 01/29/2023   ANIONGAP 12 04/25/2019   EGFR 78.0 02/26/2023   Lab Results  Component Value Date   CHOL 110 01/29/2023   Lab Results  Component Value Date   HDL 55 01/29/2023   Lab Results  Component Value Date   LDLCALC 41 01/29/2023   Lab Results  Component Value Date   TRIG 61 01/29/2023   Lab Results  Component Value Date   CHOLHDL 2.0 01/29/2023   Lab Results  Component Value Date   HGBA1C 5.5 05/14/2023      Assessment & Plan:   Problem List Items Addressed This Visit       Cardiovascular and Mediastinum   CAD (coronary artery disease)    On statin BP WNL Check lipid profile      Essential hypertension - Primary    BP Readings from Last 1 Encounters:  06/11/23 (!) 147/67   Well-controlled with Amlodipine and Losartan Counseled for compliance with the medications Advised DASH diet and moderate exercise/walking, at least 150 mins/week        Endocrine   Type 2 diabetes mellitus with diabetic neuropathy (HCC)    Lab Results  Component Value Date   HGBA1C 5.5 05/14/2023   Well-controlled - but her elevated blood glucose does not align with HbA1c On Basaglar 30 U at bedtime Needs to take NovoLog as sliding scale On Ozempic 2 mg qw On Metformin 1000 mg once daily and Jardiance 10 mg QD  Had Dexcom, but did not like it, but wants to try it again Used to follow up with Endocrinologist in [place], has appt with Dr Willeen Cass office in this week On ARB and statin  Was on Cymbalta for neuropathy, but has stopped  taking it On Percocet for chronic back pain, followed by pain clinic Added amitriptyline for insomnia, can also help with fatigue, headache and neuropathy      Relevant Orders   CMP14+EGFR   TSH + free T4     Other   Morbid obesity (HCC)    BMI Readings from Last 3 Encounters:  06/11/23 37.70 kg/m  05/14/23 37.52 kg/m  03/22/23 37.52 kg/m   Associated with HTN, type 2 DM and HLD Diet modification and ambulation as tolerated On GLP-1 agonist for type II DM      Current smoker    Smokes about 1 pack/day  Asked about quitting: confirms that she currently smokes cigarettes Advise to quit smoking: Educated about QUITTING to reduce the risk of cancer, cardio and cerebrovascular disease. Assess willingness: Unwilling to quit at this time, but is working on cutting back. Assist with counseling and pharmacotherapy: Counseled for 5 minutes and literature provided. Arrange for follow up: Follow up in 3 months and continue to offer help.      Hyperlipidemia    On Atorvastatin Check lipid profile      Relevant Orders   Lipid Profile   Primary insomnia    She sleeps only 4 to 5 hours a day Chronic insomnia can lead to fatigue and headache as well She lives alone, does not report apneic episode, but is not clear about snoring Offered home sleep study for evaluation of insomnia and chronic fatigue, but she denies Added amitriptyline for insomnia, can also help with fatigue, headache and neuropathy      Relevant Medications   amitriptyline (ELAVIL) 10 MG tablet   Other Relevant Orders   TSH + free T4    Meds ordered this encounter  Medications   amitriptyline (ELAVIL) 10 MG tablet    Sig: Take 1 tablet (10 mg total) by mouth at bedtime.    Dispense:  30 tablet    Refill:  3  Follow-up: Return in about 4 months (around 10/09/2023) for Insomnia and HTN.    Anabel Halon, MD

## 2023-06-11 NOTE — Assessment & Plan Note (Addendum)
Lab Results  Component Value Date   HGBA1C 5.5 05/14/2023   Well-controlled - but her elevated blood glucose does not align with HbA1c On Basaglar 30 U at bedtime Needs to take NovoLog as sliding scale On Ozempic 2 mg qw On Metformin 1000 mg once daily and Jardiance 10 mg QD  Had Dexcom, but did not like it, but wants to try it again Used to follow up with Endocrinologist in [place], has appt with Dr Willeen Cass office in this week On ARB and statin  Was on Cymbalta for neuropathy, but has stopped taking it On Percocet for chronic back pain, followed by pain clinic Added amitriptyline for insomnia, can also help with fatigue, headache and neuropathy

## 2023-06-11 NOTE — Assessment & Plan Note (Addendum)
On statin BP WNL Check lipid profile

## 2023-06-11 NOTE — Patient Instructions (Addendum)
Please start taking Amitriptyline as prescribed for insomnia.  Please maintain simple sleep hygiene. - Maintain dark and non-noisy environment in the bedroom. - Please use the bedroom for sleep and sexual activity only. - Do not use electronic devices in the bedroom. - Please take dinner at least 2 hours before bedtime. - Please avoid caffeinated products in the evening, including coffee, soft drinks. - Please try to maintain the regular sleep-wake cycle - Go to bed and wake up at the same time.  Please continue to take medications as prescribed.  Please continue to follow low carb diet and perform moderate exercise/walking as tolerated.

## 2023-06-11 NOTE — Assessment & Plan Note (Signed)
She sleeps only 4 to 5 hours a day Chronic insomnia can lead to fatigue and headache as well She lives alone, does not report apneic episode, but is not clear about snoring Offered home sleep study for evaluation of insomnia and chronic fatigue, but she denies Added amitriptyline for insomnia, can also help with fatigue, headache and neuropathy

## 2023-06-11 NOTE — Assessment & Plan Note (Signed)
Smokes about 1 pack/day  Asked about quitting: confirms that she currently smokes cigarettes Advise to quit smoking: Educated about QUITTING to reduce the risk of cancer, cardio and cerebrovascular disease. Assess willingness: Unwilling to quit at this time, but is working on cutting back. Assist with counseling and pharmacotherapy: Counseled for 5 minutes and literature provided. Arrange for follow up: Follow up in 3 months and continue to offer help. 

## 2023-06-11 NOTE — Assessment & Plan Note (Signed)
On Atorvastatin Check lipid profile

## 2023-06-11 NOTE — Assessment & Plan Note (Signed)
BP Readings from Last 1 Encounters:  06/11/23 (!) 147/67   Well-controlled with Amlodipine and Losartan Counseled for compliance with the medications Advised DASH diet and moderate exercise/walking, at least 150 mins/week

## 2023-06-11 NOTE — Assessment & Plan Note (Signed)
BMI Readings from Last 3 Encounters:  06/11/23 37.70 kg/m  05/14/23 37.52 kg/m  03/22/23 37.52 kg/m   Associated with HTN, type 2 DM and HLD Diet modification and ambulation as tolerated On GLP-1 agonist for type II DM

## 2023-06-12 LAB — CMP14+EGFR
ALT: 14 [IU]/L (ref 0–32)
AST: 12 [IU]/L (ref 0–40)
Albumin: 4.1 g/dL (ref 3.9–4.9)
Alkaline Phosphatase: 138 [IU]/L — ABNORMAL HIGH (ref 44–121)
BUN/Creatinine Ratio: 16 (ref 12–28)
BUN: 16 mg/dL (ref 8–27)
Bilirubin Total: 0.3 mg/dL (ref 0.0–1.2)
CO2: 22 mmol/L (ref 20–29)
Calcium: 9.3 mg/dL (ref 8.7–10.3)
Chloride: 106 mmol/L (ref 96–106)
Creatinine, Ser: 1.01 mg/dL — ABNORMAL HIGH (ref 0.57–1.00)
Globulin, Total: 3.1 g/dL (ref 1.5–4.5)
Glucose: 147 mg/dL — ABNORMAL HIGH (ref 70–99)
Potassium: 5.1 mmol/L (ref 3.5–5.2)
Sodium: 143 mmol/L (ref 134–144)
Total Protein: 7.2 g/dL (ref 6.0–8.5)
eGFR: 60 mL/min/{1.73_m2} (ref >59)

## 2023-06-12 LAB — LIPID PANEL
Chol/HDL Ratio: 2 ratio (ref 0.0–4.4)
Cholesterol, Total: 105 mg/dL (ref 100–199)
HDL: 52 mg/dL (ref >39)
LDL Chol Calc (NIH): 34 mg/dL (ref 0–99)
Triglycerides: 101 mg/dL (ref 0–149)
VLDL Cholesterol Cal: 19 mg/dL (ref 5–40)

## 2023-06-12 LAB — TSH+FREE T4
Free T4: 1.31 ng/dL (ref 0.82–1.77)
TSH: 0.816 u[IU]/mL (ref 0.450–4.500)

## 2023-06-13 ENCOUNTER — Ambulatory Visit: Payer: Medicare PPO

## 2023-06-14 NOTE — Progress Notes (Signed)
Erroneous encounter

## 2023-06-15 ENCOUNTER — Ambulatory Visit (HOSPITAL_COMMUNITY): Payer: Medicare HMO

## 2023-07-02 ENCOUNTER — Ambulatory Visit (HOSPITAL_COMMUNITY): Payer: Self-pay

## 2023-07-02 ENCOUNTER — Other Ambulatory Visit: Payer: Self-pay | Admitting: Internal Medicine

## 2023-07-02 ENCOUNTER — Telehealth: Payer: Self-pay

## 2023-07-02 DIAGNOSIS — E1140 Type 2 diabetes mellitus with diabetic neuropathy, unspecified: Secondary | ICD-10-CM

## 2023-07-02 MED ORDER — LANTUS SOLOSTAR 100 UNIT/ML ~~LOC~~ SOPN: 30.0000 [IU] | PEN_INJECTOR | Freq: Every day | SUBCUTANEOUS | 3 refills | Status: DC

## 2023-07-02 NOTE — Telephone Encounter (Signed)
Request placed in dr patel box

## 2023-07-02 NOTE — Telephone Encounter (Signed)
Copied from CRM [id number]. Topic: Clinical - Prescription Issue >> Jul 02, 2023  8:52 AM Conni Elliot wrote: Reason for CRM: pharmacy told pt she needs pre authorization for medication or a substitute medication sent in

## 2023-07-09 ENCOUNTER — Other Ambulatory Visit: Payer: Self-pay | Admitting: Family Medicine

## 2023-07-09 DIAGNOSIS — M25562 Pain in left knee: Secondary | ICD-10-CM | POA: Diagnosis not present

## 2023-07-09 DIAGNOSIS — Z79899 Other long term (current) drug therapy: Secondary | ICD-10-CM | POA: Diagnosis not present

## 2023-07-09 DIAGNOSIS — M130 Polyarthritis, unspecified: Secondary | ICD-10-CM | POA: Diagnosis not present

## 2023-07-09 DIAGNOSIS — Z6837 Body mass index (BMI) 37.0-37.9, adult: Secondary | ICD-10-CM | POA: Diagnosis not present

## 2023-07-09 DIAGNOSIS — M545 Low back pain, unspecified: Secondary | ICD-10-CM | POA: Diagnosis not present

## 2023-07-09 DIAGNOSIS — M329 Systemic lupus erythematosus, unspecified: Secondary | ICD-10-CM | POA: Diagnosis not present

## 2023-07-09 DIAGNOSIS — G8929 Other chronic pain: Secondary | ICD-10-CM | POA: Diagnosis not present

## 2023-07-09 DIAGNOSIS — D649 Anemia, unspecified: Secondary | ICD-10-CM | POA: Diagnosis not present

## 2023-07-09 DIAGNOSIS — Z0130 Encounter for examination of blood pressure without abnormal findings: Secondary | ICD-10-CM | POA: Diagnosis not present

## 2023-07-09 DIAGNOSIS — G894 Chronic pain syndrome: Secondary | ICD-10-CM | POA: Diagnosis not present

## 2023-07-09 DIAGNOSIS — E119 Type 2 diabetes mellitus without complications: Secondary | ICD-10-CM | POA: Diagnosis not present

## 2023-07-09 DIAGNOSIS — Z8739 Personal history of other diseases of the musculoskeletal system and connective tissue: Secondary | ICD-10-CM | POA: Diagnosis not present

## 2023-07-09 DIAGNOSIS — M47816 Spondylosis without myelopathy or radiculopathy, lumbar region: Secondary | ICD-10-CM | POA: Diagnosis not present

## 2023-07-13 ENCOUNTER — Telehealth: Payer: Self-pay

## 2023-07-13 NOTE — Telephone Encounter (Signed)
Left message to return call 

## 2023-07-13 NOTE — Telephone Encounter (Signed)
Copied from CRM [id number]. Topic: Clinical - Prescription Issue >> Jul 13, 2023  8:50 AM Amy B wrote: Reason for CRM: Patient states she is out of her test strips.  She uses them 4 times per day and has run out.  The pharmacist told her she cannot get anymore strips until the 28th.  She asks for a call back to discuss her options, [phone].

## 2023-07-16 ENCOUNTER — Inpatient Hospital Stay (HOSPITAL_COMMUNITY): Admission: RE | Admit: 2023-07-16 | Payer: Self-pay | Source: Ambulatory Visit

## 2023-07-17 ENCOUNTER — Ambulatory Visit: Payer: Self-pay | Admitting: Internal Medicine

## 2023-07-17 ENCOUNTER — Other Ambulatory Visit: Payer: Self-pay

## 2023-07-17 DIAGNOSIS — E1140 Type 2 diabetes mellitus with diabetic neuropathy, unspecified: Secondary | ICD-10-CM

## 2023-07-17 MED ORDER — BLOOD GLUCOSE TEST VI STRP: ORAL_STRIP | 2 refills | Status: DC

## 2023-07-17 NOTE — Telephone Encounter (Signed)
Tried to call patient.

## 2023-07-17 NOTE — Telephone Encounter (Signed)
Copied from CRM [id number]. Topic: Clinical - Red Word Triage >> Jul 17, 2023  8:50 AM Prudencio Pair wrote: Red Word that prompted transfer to Nurse Triage: Patient states that she think she has the flu. States she had chills last night & this morning. Wants to see if provider can call something in. Pt states she's already had covid test and doesn't have covid.   Chief Complaint: Flu Like Symptoms Symptoms: Headaches, Runny nose with clear phlegm, some body aches Frequency: x 3-4 days) Pertinent Negatives: Patient denies fever at this time, nausea, vomiting, earaches, breathing problems Disposition: [] ED /[x] Urgent Care (no appt availability in office) / [] Appointment(In office/virtual)/ []  [facility]/ [] Home Care/ [] Refused Recommended Disposition /[] [facility]/ []  Follow-up with PCP Additional Notes: Patient called with flu like symptoms.  Patient states a negative home covid test.  Patient states symptoms start 3-4 days ago. Patient denies any fevers at this time, although she had a fever of 101 last night that resolved after taking Tylenol.  Patient also denies any nausea or vomiting or abnormal diarrhea different from her usual bowel movements with her history of diverticulitis. Pt also denies any ear aches or breathing problems.  Patient states she knows this is the flu but she has not had any known contacts with anyone with the flu.  Patient was advised to call and ask for Tamiflu.  Due to no appointments available in the office today, this RN asked patient about a virtual appt, however, she did not have MyChart set up needed to access this.  I started to help her set this up but patient was not receiving the texts with the activation codes while I was speaking to her.  She said she would just try to set that up next time she was in the office in person.  Patient was offered an appointment tomorrow morning in the office but she declined and said she was out of work today and was  going back to work tomorrow and just wanted something possibly called in today such as Tamiflu.  This RN advised patient that an urgent care would be recommended due to not appointments available today and declining the appointment tomorrow morning in the office.  Patient said she considered the urgent care and said if she didn't feel better by lunch time, she would go to the urgent care.  I advised her that if anything changes or her symptoms worsened to either call us back or call 911 or go to the emergency room.  Patient verbalized understanding.  Reason for Disposition  Patient is HIGH RISK (e.g., age > 64 years, pregnant, HIV+, or chronic medical condition)  Answer Assessment - Initial Assessment Questions 1. WORST SYMPTOM: "What is your worst symptom?" (e.g., cough, runny nose, muscle aches, headache, sore throat, fever)      Headaches and runny nose with clear phlegm 2. ONSET: "When did your flu symptoms start?"      Friday or Saturday (3-4 days ago) 3. COUGH: "How bad is the cough?"       Dry cough 4. RESPIRATORY DISTRESS: "Describe your breathing."      "Breathing is fine" 5. FEVER: "Do you have a fever?" If Yes, ask: "What is your temperature, how was it measured, and when did it start?"     Last night it was 101 but not this morning--pt took Tylenol 6. EXPOSURE: "Were you exposed to someone with influenza?"       Denies 7. FLU VACCINE: "Did  you get a flu shot this year?"     Patient does not take flu shots 8. HIGH RISK DISEASE: "Do you have any chronic medical problems?" (e.g., heart or lung disease, asthma, weak immune system, or other HIGH RISK conditions)     Diabetes 10. OTHER SYMPTOMS: "Do you have any other symptoms?"  (e.g., runny nose, muscle aches, headache, sore throat)       Headaches, runny nose with clear mucous  Protocols used: Influenza (Flu) - [facility]

## 2023-07-30 ENCOUNTER — Ambulatory Visit (HOSPITAL_COMMUNITY): Payer: Self-pay

## 2023-08-06 ENCOUNTER — Other Ambulatory Visit: Payer: Self-pay | Admitting: Internal Medicine

## 2023-08-06 DIAGNOSIS — I10 Essential (primary) hypertension: Secondary | ICD-10-CM

## 2023-08-09 DIAGNOSIS — Z0130 Encounter for examination of blood pressure without abnormal findings: Secondary | ICD-10-CM | POA: Diagnosis not present

## 2023-08-09 DIAGNOSIS — G894 Chronic pain syndrome: Secondary | ICD-10-CM | POA: Diagnosis not present

## 2023-08-09 DIAGNOSIS — M25562 Pain in left knee: Secondary | ICD-10-CM | POA: Diagnosis not present

## 2023-08-09 DIAGNOSIS — M545 Low back pain, unspecified: Secondary | ICD-10-CM | POA: Diagnosis not present

## 2023-08-09 DIAGNOSIS — M47816 Spondylosis without myelopathy or radiculopathy, lumbar region: Secondary | ICD-10-CM | POA: Diagnosis not present

## 2023-08-09 DIAGNOSIS — Z6837 Body mass index (BMI) 37.0-37.9, adult: Secondary | ICD-10-CM | POA: Diagnosis not present

## 2023-08-09 DIAGNOSIS — G8929 Other chronic pain: Secondary | ICD-10-CM | POA: Diagnosis not present

## 2023-08-09 DIAGNOSIS — Z79899 Other long term (current) drug therapy: Secondary | ICD-10-CM | POA: Diagnosis not present

## 2023-08-09 DIAGNOSIS — M130 Polyarthritis, unspecified: Secondary | ICD-10-CM | POA: Diagnosis not present

## 2023-08-13 ENCOUNTER — Other Ambulatory Visit: Payer: Self-pay | Admitting: Internal Medicine

## 2023-08-15 ENCOUNTER — Ambulatory Visit (HOSPITAL_COMMUNITY)
Admission: RE | Admit: 2023-08-15 | Discharge: 2023-08-15 | Disposition: A | Discharge status: Home / Self Care | Payer: Medicare PPO | Source: Ambulatory Visit | Attending: Internal Medicine | Admitting: Internal Medicine

## 2023-08-15 DIAGNOSIS — Z1231 Encounter for screening mammogram for malignant neoplasm of breast: Secondary | ICD-10-CM | POA: Diagnosis not present

## 2023-08-20 DIAGNOSIS — F17210 Nicotine dependence, cigarettes, uncomplicated: Secondary | ICD-10-CM | POA: Diagnosis not present

## 2023-08-20 DIAGNOSIS — E118 Type 2 diabetes mellitus with unspecified complications: Secondary | ICD-10-CM | POA: Diagnosis not present

## 2023-08-20 DIAGNOSIS — I10 Essential (primary) hypertension: Secondary | ICD-10-CM | POA: Diagnosis not present

## 2023-08-20 DIAGNOSIS — E785 Hyperlipidemia, unspecified: Secondary | ICD-10-CM | POA: Diagnosis not present

## 2023-08-20 DIAGNOSIS — E1165 Type 2 diabetes mellitus with hyperglycemia: Secondary | ICD-10-CM | POA: Diagnosis not present

## 2023-08-27 ENCOUNTER — Other Ambulatory Visit: Payer: Self-pay | Admitting: Internal Medicine

## 2023-09-04 DIAGNOSIS — R0602 Shortness of breath: Secondary | ICD-10-CM | POA: Diagnosis not present

## 2023-09-04 DIAGNOSIS — M79605 Pain in left leg: Secondary | ICD-10-CM | POA: Diagnosis not present

## 2023-09-04 DIAGNOSIS — I517 Cardiomegaly: Secondary | ICD-10-CM | POA: Diagnosis not present

## 2023-09-04 DIAGNOSIS — M79604 Pain in right leg: Secondary | ICD-10-CM | POA: Diagnosis not present

## 2023-09-04 DIAGNOSIS — R0989 Other specified symptoms and signs involving the circulatory and respiratory systems: Secondary | ICD-10-CM | POA: Diagnosis not present

## 2023-09-06 ENCOUNTER — Other Ambulatory Visit: Payer: Self-pay | Admitting: Internal Medicine

## 2023-09-06 DIAGNOSIS — E1140 Type 2 diabetes mellitus with diabetic neuropathy, unspecified: Secondary | ICD-10-CM

## 2023-09-07 DIAGNOSIS — Z0000 Encounter for general adult medical examination without abnormal findings: Secondary | ICD-10-CM | POA: Diagnosis not present

## 2023-09-07 DIAGNOSIS — Z6836 Body mass index (BMI) 36.0-36.9, adult: Secondary | ICD-10-CM | POA: Diagnosis not present

## 2023-09-07 DIAGNOSIS — M47816 Spondylosis without myelopathy or radiculopathy, lumbar region: Secondary | ICD-10-CM | POA: Diagnosis not present

## 2023-09-07 DIAGNOSIS — G894 Chronic pain syndrome: Secondary | ICD-10-CM | POA: Diagnosis not present

## 2023-09-07 DIAGNOSIS — M545 Low back pain, unspecified: Secondary | ICD-10-CM | POA: Diagnosis not present

## 2023-09-07 DIAGNOSIS — E6609 Other obesity due to excess calories: Secondary | ICD-10-CM | POA: Diagnosis not present

## 2023-09-07 DIAGNOSIS — G8929 Other chronic pain: Secondary | ICD-10-CM | POA: Diagnosis not present

## 2023-09-07 DIAGNOSIS — Z79899 Other long term (current) drug therapy: Secondary | ICD-10-CM | POA: Diagnosis not present

## 2023-09-07 DIAGNOSIS — M25562 Pain in left knee: Secondary | ICD-10-CM | POA: Diagnosis not present

## 2023-09-07 DIAGNOSIS — M329 Systemic lupus erythematosus, unspecified: Secondary | ICD-10-CM | POA: Diagnosis not present

## 2023-09-07 DIAGNOSIS — Z0130 Encounter for examination of blood pressure without abnormal findings: Secondary | ICD-10-CM | POA: Diagnosis not present

## 2023-09-07 DIAGNOSIS — Z6837 Body mass index (BMI) 37.0-37.9, adult: Secondary | ICD-10-CM | POA: Diagnosis not present

## 2023-09-14 ENCOUNTER — Ambulatory Visit: Payer: Medicare PPO | Admitting: "Endocrinology

## 2023-09-26 ENCOUNTER — Other Ambulatory Visit: Payer: Self-pay | Admitting: "Endocrinology

## 2023-10-05 DIAGNOSIS — E6609 Other obesity due to excess calories: Secondary | ICD-10-CM | POA: Diagnosis not present

## 2023-10-05 DIAGNOSIS — I517 Cardiomegaly: Secondary | ICD-10-CM | POA: Diagnosis not present

## 2023-10-05 DIAGNOSIS — Z79899 Other long term (current) drug therapy: Secondary | ICD-10-CM | POA: Diagnosis not present

## 2023-10-05 DIAGNOSIS — G8929 Other chronic pain: Secondary | ICD-10-CM | POA: Diagnosis not present

## 2023-10-05 DIAGNOSIS — E119 Type 2 diabetes mellitus without complications: Secondary | ICD-10-CM | POA: Diagnosis not present

## 2023-10-05 DIAGNOSIS — Z6837 Body mass index (BMI) 37.0-37.9, adult: Secondary | ICD-10-CM | POA: Diagnosis not present

## 2023-10-05 DIAGNOSIS — M47816 Spondylosis without myelopathy or radiculopathy, lumbar region: Secondary | ICD-10-CM | POA: Diagnosis not present

## 2023-10-05 DIAGNOSIS — M545 Low back pain, unspecified: Secondary | ICD-10-CM | POA: Diagnosis not present

## 2023-10-05 DIAGNOSIS — M25562 Pain in left knee: Secondary | ICD-10-CM | POA: Diagnosis not present

## 2023-10-08 ENCOUNTER — Other Ambulatory Visit: Payer: Self-pay | Admitting: "Endocrinology

## 2023-10-09 ENCOUNTER — Ambulatory Visit: Payer: Medicare PPO | Admitting: Internal Medicine

## 2023-10-24 ENCOUNTER — Other Ambulatory Visit: Payer: Self-pay | Admitting: Internal Medicine

## 2023-10-24 DIAGNOSIS — E1140 Type 2 diabetes mellitus with diabetic neuropathy, unspecified: Secondary | ICD-10-CM

## 2023-10-24 DIAGNOSIS — I10 Essential (primary) hypertension: Secondary | ICD-10-CM

## 2023-10-29 ENCOUNTER — Other Ambulatory Visit: Payer: Self-pay | Admitting: "Endocrinology

## 2023-11-02 ENCOUNTER — Other Ambulatory Visit: Payer: Self-pay | Admitting: Internal Medicine

## 2023-11-02 DIAGNOSIS — E1140 Type 2 diabetes mellitus with diabetic neuropathy, unspecified: Secondary | ICD-10-CM

## 2023-11-02 DIAGNOSIS — E785 Hyperlipidemia, unspecified: Secondary | ICD-10-CM

## 2023-11-06 DIAGNOSIS — M47816 Spondylosis without myelopathy or radiculopathy, lumbar region: Secondary | ICD-10-CM | POA: Diagnosis not present

## 2023-11-06 DIAGNOSIS — Z87891 Personal history of nicotine dependence: Secondary | ICD-10-CM | POA: Diagnosis not present

## 2023-11-06 DIAGNOSIS — Z6837 Body mass index (BMI) 37.0-37.9, adult: Secondary | ICD-10-CM | POA: Diagnosis not present

## 2023-11-06 DIAGNOSIS — F17210 Nicotine dependence, cigarettes, uncomplicated: Secondary | ICD-10-CM | POA: Diagnosis not present

## 2023-11-06 DIAGNOSIS — M25562 Pain in left knee: Secondary | ICD-10-CM | POA: Diagnosis not present

## 2023-11-06 DIAGNOSIS — Z79899 Other long term (current) drug therapy: Secondary | ICD-10-CM | POA: Diagnosis not present

## 2023-11-06 DIAGNOSIS — G894 Chronic pain syndrome: Secondary | ICD-10-CM | POA: Diagnosis not present

## 2023-11-06 DIAGNOSIS — M545 Low back pain, unspecified: Secondary | ICD-10-CM | POA: Diagnosis not present

## 2023-11-06 DIAGNOSIS — E119 Type 2 diabetes mellitus without complications: Secondary | ICD-10-CM | POA: Diagnosis not present

## 2023-11-26 ENCOUNTER — Other Ambulatory Visit: Payer: Self-pay | Admitting: Internal Medicine

## 2023-11-30 ENCOUNTER — Encounter: Payer: Self-pay | Admitting: Internal Medicine

## 2023-11-30 ENCOUNTER — Ambulatory Visit (INDEPENDENT_AMBULATORY_CARE_PROVIDER_SITE_OTHER): Admitting: Internal Medicine

## 2023-11-30 ENCOUNTER — Ambulatory Visit: Payer: Medicare PPO | Admitting: Internal Medicine

## 2023-11-30 VITALS — BP 122/68 | HR 106 | Ht 63.0 in | Wt 214.6 lb

## 2023-11-30 DIAGNOSIS — F5101 Primary insomnia: Secondary | ICD-10-CM | POA: Diagnosis not present

## 2023-11-30 DIAGNOSIS — E1140 Type 2 diabetes mellitus with diabetic neuropathy, unspecified: Secondary | ICD-10-CM | POA: Diagnosis not present

## 2023-11-30 DIAGNOSIS — M51362 Other intervertebral disc degeneration, lumbar region with discogenic back pain and lower extremity pain: Secondary | ICD-10-CM | POA: Diagnosis not present

## 2023-11-30 DIAGNOSIS — I10 Essential (primary) hypertension: Secondary | ICD-10-CM | POA: Diagnosis not present

## 2023-11-30 DIAGNOSIS — Z794 Long term (current) use of insulin: Secondary | ICD-10-CM | POA: Diagnosis not present

## 2023-11-30 MED ORDER — AMITRIPTYLINE HCL 10 MG PO TABS: 10.0000 mg | ORAL_TABLET | Freq: Every day | ORAL | 3 refills | Status: DC

## 2023-11-30 NOTE — Assessment & Plan Note (Addendum)
 She sleeps only 4 to 5 hours a day Chronic insomnia can lead to fatigue and headache as well She lives alone, does not report apneic episode, but is not clear about snoring Offered home sleep study for evaluation of insomnia and chronic fatigue, but she denies Added amitriptyline  for insomnia, can also help with fatigue, headache and neuropathy - needs to start taking it

## 2023-11-30 NOTE — Assessment & Plan Note (Signed)
 BP Readings from Last 1 Encounters:  11/30/23 122/68   Well-controlled with Amlodipine  and Losartan  Counseled for compliance with the medications Advised DASH diet and moderate exercise/walking, at least 150 mins/week

## 2023-11-30 NOTE — Assessment & Plan Note (Signed)
 C/o chronic low back pain On Cymbalta and Percocet as needed Followed by [facility]

## 2023-11-30 NOTE — Progress Notes (Signed)
 Established Patient Office Visit  Subjective:  Patient ID: Stephanie Hammond, female    DOB: 11-09-1950  Age: 73 y.o. MRN: [id number]  CC:  Chief Complaint  Patient presents with   Medical Management of Chronic Issues    4 month f/u , about the same with insomnia.     HPI Stephanie Hammond is a 73 y.o. female with past medical history of HTN, HLD, type 2 DM, diverticulitis, s/p partial colectomy, chronic pain syndrome, gout, obesity and tobacco abuse who presents for f/u of her chronic medical conditions.  HTN: BP is well-controlled. Takes medications regularly. Patient denies dizziness, chest pain, dyspnea or palpitations.  Type II DM: Her HbA1c was 5.5 in 10/24. She has tolerated Ozempic  well. She takes Basaglar  30 units at nighttime, but adjusts it according to her glucose at nighttime, takes 15 U if blood glucose is less than 200 and does not take it if it is close to 100. She takes mealtime insulin  per sliding scale.  She has noticed blood glucose readings in 200s ??. She denies any episode of hypoglycemia recently. She has tried CGM in the past, but did not like it. She is seeing Dr Ronelle Coffee, Endocrinology provider in [place] now.  Insomnia, chronic fatigue and headache:   She has difficulty maintaining sleep and wakes up at 3 AM at times.  She also reports headache, which is generalized, dull and nonradiating.  Denies any dizziness or visual disturbance currently.  She has not tried Elavil  yet.   Past Medical History:  Diagnosis Date   Anemia    Blood transfusion 2009   after GI bleed   CAD (coronary artery disease)    Diabetes mellitus    2   Diverticulitis    Diverticulosis    Fear of local anesthetic    01-02-2019, patient states with last surgery in 2016 , she refused to have epidural before she was sedated d/t to extreme aversion to needles near her back     GERD (gastroesophageal reflux disease)    Gout    Headache    occ if sugar is  too hi or low    Heart murmur     "i was told this years and years and years ago "   History of GI diverticular bleed 08/2004   Hypertension    IBS (irritable bowel syndrome)    Osteoarthritis    Tachycardia    "it stays like that for years between 119 and 121 and it doesnt bother me  none "     Past Surgical History:  Procedure Laterality Date   CHOLECYSTECTOMY  2003   COLON SURGERY     COLONOSCOPY  2000   Dr. Sandrea Cruel: marked diverticulosis, difficult procedure due to adhesions, polyps benign   COLONOSCOPY  2006   Dr. Elvin Hammer: severe pandiverticulosis   COLONOSCOPY N/A 02/03/2013   SLF: 4 COLORECTAL POLYPS REMOVED/Moderate diverticulosis throughout the entire examined colon/Small internal hemorrhoids   COLONOSCOPY WITH PROPOFOL  N/A 04/29/2019   Procedure: COLONOSCOPY WITH PROPOFOL ;  Surgeon: Alyce Jubilee, MD;  [facility]: [facility];  Service: Endoscopy;  Laterality: N/A;  8:30am   JOINT REPLACEMENT Right 2009   Knee Replacement Right    POLYPECTOMY  04/29/2019   Procedure: POLYPECTOMY;  Surgeon: Alyce Jubilee, MD;  Location: [facility];  Service: Endoscopy;;  colon   PTCA     over 10 years ago , unsure of exact year , does recall that no stents were placed  REPLACEMENT TOTAL KNEE     right    TOTAL KNEE ARTHROPLASTY Left 03/12/2015   Procedure: LEFT TOTAL KNEE REPLACEMENT;  Surgeon: Darrin Emerald, MD;  Location: [facility];  Service: Orthopedics;  Laterality: Left;   TOTAL KNEE REVISION Left 01/09/2019   Procedure: TOTAL KNEE REVISION;  Surgeon: Adonica Hoose, MD;  Location: [facility];  Service: Orthopedics;  Laterality: Left;   VAGINAL HYSTERECTOMY      Family History  Problem Relation Age of Onset   Colon polyps Sister    Cancer Mother        lung    Diabetes Mother    Cancer Father        stomach    Stomach cancer Father        questionable   Colon cancer Neg Hx     Social History   Socioeconomic History   Marital status: Married    Spouse name: Not on file   Number of children: 2   Years  of education: Not on file   Highest education level: Not on file  Occupational History   Not on file  Tobacco Use   Smoking status: Every Day    Current packs/day: 1.00    Average packs/day: 1 pack/day for 35.0 years (35.0 ttl pk-yrs)    Types: Cigarettes    Passive exposure: Current   Smokeless tobacco: Never  Vaping Use   Vaping status: Never Used  Substance and Sexual Activity   Alcohol  use: No   Drug use: No   Sexual activity: Not Currently    Birth control/protection: Surgical    Comment: hyst  Other Topics Concern   Not on file  Social History Narrative   Husband passed away lives alone    Social Drivers of Health   Financial Resource Strain: Low Risk  (03/03/2022)   Overall Financial Resource Strain (CARDIA)    Difficulty of Paying Living Expenses: Not hard at all  Food Insecurity: No Food Insecurity (03/03/2022)   Hunger Vital Sign    Worried About Running Out of Food in the Last Year: Never true    Ran Out of Food in the Last Year: Never true  Transportation Needs: No Transportation Needs (03/03/2022)   PRAPARE - Administrator, Civil Service (Medical): No    Lack of Transportation (Non-Medical): No  Physical Activity: Inactive (03/03/2022)   Exercise Vital Sign    Days of Exercise per Week: 0 days    Minutes of Exercise per Session: 0 min  Stress: No Stress Concern Present (03/03/2022)   [employer] of Occupational Health - Occupational Stress Questionnaire    Feeling of Stress : Not at all  Social Connections: Moderately Isolated (03/03/2022)   Social Connection and Isolation Panel [NHANES]    Frequency of Communication with Friends and Family: More than three times a week    Frequency of Social Gatherings with Friends and Family: More than three times a week    Attends Religious Services: More than 4 times per year    Active Member of [employer] or Organizations: No    Attends Banker Meetings: Never    Marital Status: Widowed   Intimate Partner Violence: Not At Risk (03/03/2022)   Humiliation, Afraid, Rape, and Kick questionnaire    Fear of Current or Ex-Partner: No    Emotionally Abused: No    Physically Abused: No    Sexually Abused: No    Outpatient Medications Prior to Visit  Medication Sig Dispense  Refill   ACCU-CHEK GUIDE TEST test strip USE as directed TO monitor blood glucose four TIMES daily.] 200 strip 2   amLODipine  (NORVASC ) 10 MG tablet TAKE ONE TABLET BY MOUTH ONCE DAILY 90 tablet 1   atorvastatin  (LIPITOR) 20 MG tablet TAKE ONE TABLET BY MOUTH EVERY DAY 90 tablet 1   BD PEN NEEDLE NANO 2ND GEN 32G X 4 MM MISC USE AS DIRECTED TO INJECT INSULIN  FOUR TIMES DAILY 100 each 2   blood glucose meter kit and supplies Dispense based on patient and insurance preference. Use up to four times daily as directed. (FOR ICD-10 E10.9, E11.9). 1 each 0   Blood Glucose Monitoring Suppl (ACCU-CHEK GUIDE) w/Device KIT Use to test BG bid. Dx E11.65 1 kit 0   BUTRANS 15 MCG/HR Place 1 patch onto the skin once a week.     cetirizine  (ZYRTEC ) 10 MG tablet Take 1 tablet (10 mg total) by mouth daily. (Patient not taking: Reported on 03/22/2023) 30 tablet 0   Continuous Glucose Receiver (FREESTYLE LIBRE 3 READER) DEVI 1 Piece by Does not apply route once as needed for up to 1 dose. 1 each 0   Continuous Glucose Sensor (FREESTYLE LIBRE 3 PLUS SENSOR) MISC Change sensor every 15 days. 2 each 2   empagliflozin  (JARDIANCE ) 10 MG TABS tablet TAKE 1 TABLET (10MG  TOTAL) BY MOUTH DAILY BEFORE BREAKFAST 30 tablet 0   fluticasone  (FLONASE ) 50 MCG/ACT nasal spray Place 1 spray into both nostrils daily as needed for allergies or rhinitis. (Patient not taking: Reported on 03/22/2023) 15.8 mL 0   insulin  glargine (LANTUS  SOLOSTAR) 100 UNIT/ML Solostar Pen Inject 30 Units into the skin daily. 15 mL 3   Lancets MISC Please dispense based on patient and insurance preference. Use as directed to monitor FSBS 3x daily. Dx: E11.9  For testing 4 times  daily 100 each 11   losartan  (COZAAR ) 100 MG tablet Take 1 tablet (100 mg total) by mouth daily. 90 tablet 1   metFORMIN  (GLUCOPHAGE -XR) 500 MG 24 hr tablet TAKE 2 TABLETS(1000 MG) BY MOUTH DAILY WITH BREAKFAST 180 tablet 2   omeprazole  (PRILOSEC) 20 MG capsule TAKE ONE CAPSULE BY MOUTH ONCE DAILY 30 MINUTES BEFORE morning MEAL 30 capsule 11   oxyCODONE -acetaminophen  (PERCOCET) 7.5-325 MG tablet Take 1 tablet by mouth 3 (three) times daily.     Semaglutide , 2 MG/DOSE, (OZEMPIC , 2 MG/DOSE,) 8 MG/3ML SOPN INJECT 2 MG INTO THE SKIN EVERY 7 DAYS 3 mL 3   Vitamin D , Ergocalciferol , (DRISDOL) 1.25 MG (50000 UNIT) CAPS capsule TAKE ONE CAPSULE BY MOUTH ONCE A WEEK IN THE MORNING 12 capsule 2   amitriptyline  (ELAVIL ) 10 MG tablet Take 1 tablet (10 mg total) by mouth at bedtime. 30 tablet 3   oxyCODONE -acetaminophen  (PERCOCET) 10-325 MG tablet Take 1 tablet by mouth 2 (two) times daily.     No facility-administered medications prior to visit.    Allergies  Allergen Reactions   Ace Inhibitors Cough   Bentyl  [Dicyclomine  Hcl] Other (See Comments)    Blurry vision   Penicillins Itching and Swelling    Whelps Did it involve swelling of the face/tongue/throat, SOB, or low BP? No Did it involve sudden or severe rash/hives, skin peeling, or any reaction on the inside of your mouth or nose? No Did you need to seek medical attention at a hospital or doctor's office? No When did it last happen?      20 years If all above answers are "NO", may proceed with cephalosporin  use.     ROS Review of Systems  Constitutional:  Negative for chills and fever.  HENT:  Negative for congestion, sinus pressure, sinus pain and sore throat.   Eyes:  Negative for pain and discharge.  Respiratory:  Negative for cough and shortness of breath.   Cardiovascular:  Negative for chest pain and palpitations.  Gastrointestinal:  Negative for abdominal pain, constipation, diarrhea, nausea and vomiting.  Endocrine: Negative for  polydipsia and polyuria.  Genitourinary:  Negative for dysuria and hematuria.  Musculoskeletal:  Positive for arthralgias and back pain. Negative for neck pain and neck stiffness.  Skin:  Negative for rash.  Neurological:  Positive for numbness and headaches. Negative for dizziness and weakness.  Psychiatric/Behavioral:  Positive for sleep disturbance. Negative for agitation and behavioral problems.       Objective:    Physical Exam Vitals reviewed.  Constitutional:      General: She is not in acute distress.    Appearance: She is obese. She is not diaphoretic.  HENT:     Head: Normocephalic and atraumatic.     Nose: Nose normal.     Mouth/Throat:     Mouth: Mucous membranes are moist.  Eyes:     General: No scleral icterus.    Extraocular Movements: Extraocular movements intact.  Neck:     Vascular: No carotid bruit.  Cardiovascular:     Rate and Rhythm: Normal rate and regular rhythm.     Heart sounds: Normal heart sounds. No murmur heard. Pulmonary:     Breath sounds: Normal breath sounds. No wheezing or rales.  Musculoskeletal:     Cervical back: Neck supple. No tenderness.     Right lower leg: No edema.     Left lower leg: No edema.     Comments: Tinel and Phalen sign positive on right side  Skin:    General: Skin is warm.     Findings: No rash.  Neurological:     General: No focal deficit present.     Mental Status: She is alert and oriented to person, place, and time.     Sensory: No sensory deficit.     Motor: No weakness.  Psychiatric:        Mood and Affect: Mood normal.        Behavior: Behavior normal.     BP 122/68   Pulse (!) 106   Ht 5\' 3"  (1.6 m)   Wt 214 lb 9.6 oz (97.3 kg)   SpO2 92%   BMI 38.01 kg/m  Wt Readings from Last 3 Encounters:  11/30/23 214 lb 9.6 oz (97.3 kg)  06/13/23 212 lb (96.2 kg)  06/11/23 212 lb 12.8 oz (96.5 kg)    Lab Results  Component Value Date   TSH 0.816 06/11/2023   Lab Results  Component Value Date   WBC  5.9 01/29/2023   HGB 13.6 01/29/2023   HCT 42.3 01/29/2023   MCV 96 01/29/2023   PLT 362 01/29/2023   Lab Results  Component Value Date   NA 143 06/11/2023   K 5.1 06/11/2023   CO2 22 06/11/2023   GLUCOSE 147 (H) 06/11/2023   BUN 16 06/11/2023   CREATININE 1.01 (H) 06/11/2023   BILITOT 0.3 06/11/2023   ALKPHOS 138 (H) 06/11/2023   AST 12 06/11/2023   ALT 14 06/11/2023   PROT 7.2 06/11/2023   ALBUMIN 4.1 06/11/2023   CALCIUM  9.3 06/11/2023   ANIONGAP 12 04/25/2019   EGFR 60 06/11/2023   Lab  Results  Component Value Date   CHOL 105 06/11/2023   Lab Results  Component Value Date   HDL 52 06/11/2023   Lab Results  Component Value Date   LDLCALC 34 06/11/2023   Lab Results  Component Value Date   TRIG 101 06/11/2023   Lab Results  Component Value Date   CHOLHDL 2.0 06/11/2023   Lab Results  Component Value Date   HGBA1C 5.5 05/14/2023      Assessment & Plan:   Problem List Items Addressed This Visit       Cardiovascular and Mediastinum   Essential hypertension   BP Readings from Last 1 Encounters:  11/30/23 122/68   Well-controlled with Amlodipine  and Losartan  Counseled for compliance with the medications Advised DASH diet and moderate exercise/walking, at least 150 mins/week        Endocrine   Type 2 diabetes mellitus with diabetic neuropathy (HCC) - Primary   Lab Results  Component Value Date   HGBA1C 5.5 05/14/2023   Well-controlled - but her elevated blood glucose does not align with HbA1c On Basaglar  30 U at bedtime Needs to take NovoLog  as sliding scale and take Balaglar same dose regularly instead of changing it, would prefer to decrease dose to 24 U, but would defer to Dr Ronelle Coffee On Ozempic  2 mg qw On Metformin  1000 mg once daily and Jardiance  10 mg QD  Had Dexcom, but did not like it, but wants to try it again Follows up with Endocrinologist in [place], has appt with Dr Earleen Glazier office in this week On ARB and statin  Was on  Cymbalta for neuropathy, but has stopped taking it On Percocet for chronic back pain, followed by pain clinic Added amitriptyline  for insomnia, can also help with fatigue, headache and neuropathy      Relevant Orders   Microalbumin / creatinine urine ratio   Bayer DCA Hb A1c Waived     Musculoskeletal and Integument   DDD (degenerative disc disease), lumbar   C/o chronic low back pain On Cymbalta and Percocet as needed Followed by [facility]        Other   Primary insomnia (Chronic)   She sleeps only 4 to 5 hours a day Chronic insomnia can lead to fatigue and headache as well She lives alone, does not report apneic episode, but is not clear about snoring Offered home sleep study for evaluation of insomnia and chronic fatigue, but she denies Added amitriptyline  for insomnia, can also help with fatigue, headache and neuropathy - needs to start taking it      Relevant Medications   amitriptyline  (ELAVIL ) 10 MG tablet    Meds ordered this encounter  Medications   amitriptyline  (ELAVIL ) 10 MG tablet    Sig: Take 1 tablet (10 mg total) by mouth at bedtime.    Dispense:  30 tablet    Refill:  3    Follow-up: Return in about 4 months (around 03/31/2024) for DM and insomnia.    Meldon Sport, MD

## 2023-11-30 NOTE — Patient Instructions (Addendum)
 Please schedule Annual Wellness.  Please start taking Amitriptyline  for insomnia.  Please continue to take medications as prescribed.  Please continue to follow low carb diet and perform moderate exercise/walking at least 150 mins/week.

## 2023-11-30 NOTE — Assessment & Plan Note (Addendum)
 Lab Results  Component Value Date   HGBA1C 5.5 05/14/2023   Well-controlled - but her elevated blood glucose does not align with HbA1c On Basaglar  30 U at bedtime Needs to take NovoLog  as sliding scale and take Balaglar same dose regularly instead of changing it, would prefer to decrease dose to 24 U, but would defer to Dr Ronelle Coffee On Ozempic  2 mg qw On Metformin  1000 mg once daily and Jardiance  10 mg QD  Had Dexcom, but did not like it, but wants to try it again Follows up with Endocrinologist in [place], has appt with Dr Earleen Glazier office in this week On ARB and statin  Was on Cymbalta for neuropathy, but has stopped taking it On Percocet for chronic back pain, followed by pain clinic Added amitriptyline  for insomnia, can also help with fatigue, headache and neuropathy

## 2023-12-03 LAB — BAYER DCA HB A1C WAIVED: HB A1C (BAYER DCA - WAIVED): 6.5 % — ABNORMAL HIGH (ref 4.8–5.6)

## 2023-12-04 LAB — MICROALBUMIN / CREATININE URINE RATIO
Creatinine, Urine: 29.5 mg/dL
Microalbumin, Urine: <3 ug/mL

## 2023-12-10 DIAGNOSIS — M545 Low back pain, unspecified: Secondary | ICD-10-CM | POA: Diagnosis not present

## 2023-12-10 DIAGNOSIS — F1120 Opioid dependence, uncomplicated: Secondary | ICD-10-CM | POA: Diagnosis not present

## 2023-12-10 DIAGNOSIS — Z6838 Body mass index (BMI) 38.0-38.9, adult: Secondary | ICD-10-CM | POA: Diagnosis not present

## 2023-12-10 DIAGNOSIS — M25562 Pain in left knee: Secondary | ICD-10-CM | POA: Diagnosis not present

## 2023-12-10 DIAGNOSIS — M47816 Spondylosis without myelopathy or radiculopathy, lumbar region: Secondary | ICD-10-CM | POA: Diagnosis not present

## 2023-12-17 ENCOUNTER — Telehealth: Payer: Self-pay | Admitting: Internal Medicine

## 2023-12-17 DIAGNOSIS — E1140 Type 2 diabetes mellitus with diabetic neuropathy, unspecified: Secondary | ICD-10-CM

## 2023-12-18 NOTE — Telephone Encounter (Signed)
 Copied from CRM [id number]. Topic: Clinical - Lab/Test Results >> Dec 18, 2023 11:29 AM Jyl Or S wrote: Reason for CRM: Patient is requesting call back patient have questions about getting lab work done

## 2023-12-18 NOTE — Telephone Encounter (Signed)
 [email or web address]

## 2023-12-19 ENCOUNTER — Other Ambulatory Visit: Payer: Self-pay

## 2023-12-19 ENCOUNTER — Telehealth: Payer: Self-pay

## 2023-12-19 DIAGNOSIS — I10 Essential (primary) hypertension: Secondary | ICD-10-CM

## 2023-12-19 DIAGNOSIS — E1140 Type 2 diabetes mellitus with diabetic neuropathy, unspecified: Secondary | ICD-10-CM

## 2023-12-19 NOTE — Telephone Encounter (Signed)
 Copied from CRM [id number]. Topic: General - Other >> Dec 18, 2023  2:48 PM Sophia H wrote: Reason for CRM: Pt returning heather call - CAL, no answer. Please reach back out to pt, # [phone]

## 2023-12-19 NOTE — Telephone Encounter (Signed)
 Pt stated other office is needing the labs will call them to have them fax the orders to us 

## 2023-12-19 NOTE — Telephone Encounter (Signed)
 Currently no lab orders have been received.

## 2023-12-19 NOTE — Telephone Encounter (Signed)
 Lab orders received from [facility] for alc and microalbumin, orders placed, pt states she will come on Friday morning to have labs done, fyi in case any additional labs are needed.

## 2023-12-19 NOTE — Telephone Encounter (Signed)
 Pt called in checking on status of if lab orders have been sent over from another office

## 2023-12-20 ENCOUNTER — Other Ambulatory Visit: Payer: Self-pay | Admitting: Internal Medicine

## 2023-12-20 DIAGNOSIS — E1140 Type 2 diabetes mellitus with diabetic neuropathy, unspecified: Secondary | ICD-10-CM

## 2023-12-23 ENCOUNTER — Other Ambulatory Visit: Payer: Self-pay | Admitting: Internal Medicine

## 2023-12-23 DIAGNOSIS — F5101 Primary insomnia: Secondary | ICD-10-CM

## 2023-12-24 DIAGNOSIS — E1165 Type 2 diabetes mellitus with hyperglycemia: Secondary | ICD-10-CM | POA: Diagnosis not present

## 2023-12-24 DIAGNOSIS — F17210 Nicotine dependence, cigarettes, uncomplicated: Secondary | ICD-10-CM | POA: Diagnosis not present

## 2023-12-24 DIAGNOSIS — E785 Hyperlipidemia, unspecified: Secondary | ICD-10-CM | POA: Diagnosis not present

## 2023-12-24 DIAGNOSIS — I10 Essential (primary) hypertension: Secondary | ICD-10-CM | POA: Diagnosis not present

## 2024-01-15 ENCOUNTER — Telehealth: Payer: Self-pay | Admitting: Internal Medicine

## 2024-01-15 NOTE — Telephone Encounter (Signed)
 Patient going to my eye doctor [place] next month for her eye exam

## 2024-01-16 ENCOUNTER — Other Ambulatory Visit (HOSPITAL_COMMUNITY): Payer: Self-pay | Admitting: Adult Medicine

## 2024-01-16 DIAGNOSIS — M8589 Other specified disorders of bone density and structure, multiple sites: Secondary | ICD-10-CM

## 2024-01-16 DIAGNOSIS — Z122 Encounter for screening for malignant neoplasm of respiratory organs: Secondary | ICD-10-CM

## 2024-01-23 ENCOUNTER — Ambulatory Visit: Admitting: Internal Medicine

## 2024-01-25 ENCOUNTER — Ambulatory Visit (HOSPITAL_COMMUNITY)
Admission: RE | Admit: 2024-01-25 | Discharge: 2024-01-25 | Disposition: A | Discharge status: Home / Self Care | Source: Ambulatory Visit | Attending: Adult Medicine | Admitting: Adult Medicine

## 2024-01-25 DIAGNOSIS — I700 Atherosclerosis of aorta: Secondary | ICD-10-CM | POA: Insufficient documentation

## 2024-01-25 DIAGNOSIS — I2510 Atherosclerotic heart disease of native coronary artery without angina pectoris: Secondary | ICD-10-CM | POA: Insufficient documentation

## 2024-01-25 DIAGNOSIS — Z122 Encounter for screening for malignant neoplasm of respiratory organs: Secondary | ICD-10-CM | POA: Insufficient documentation

## 2024-01-25 DIAGNOSIS — J439 Emphysema, unspecified: Secondary | ICD-10-CM | POA: Insufficient documentation

## 2024-01-25 DIAGNOSIS — D3502 Benign neoplasm of left adrenal gland: Secondary | ICD-10-CM | POA: Insufficient documentation

## 2024-01-25 DIAGNOSIS — M8589 Other specified disorders of bone density and structure, multiple sites: Secondary | ICD-10-CM | POA: Diagnosis present

## 2024-01-25 DIAGNOSIS — F17210 Nicotine dependence, cigarettes, uncomplicated: Secondary | ICD-10-CM | POA: Insufficient documentation

## 2024-01-25 DIAGNOSIS — Z13820 Encounter for screening for osteoporosis: Secondary | ICD-10-CM | POA: Diagnosis not present

## 2024-02-01 ENCOUNTER — Other Ambulatory Visit: Payer: Self-pay | Admitting: Internal Medicine

## 2024-02-01 DIAGNOSIS — I10 Essential (primary) hypertension: Secondary | ICD-10-CM

## 2024-02-06 ENCOUNTER — Telehealth: Payer: Self-pay | Admitting: Internal Medicine

## 2024-02-06 ENCOUNTER — Other Ambulatory Visit: Payer: Self-pay | Admitting: Internal Medicine

## 2024-02-06 DIAGNOSIS — M51362 Other intervertebral disc degeneration, lumbar region with discogenic back pain and lower extremity pain: Secondary | ICD-10-CM

## 2024-02-06 DIAGNOSIS — G894 Chronic pain syndrome: Secondary | ICD-10-CM

## 2024-02-06 NOTE — Telephone Encounter (Signed)
Pt informed

## 2024-02-06 NOTE — Telephone Encounter (Signed)
 Copied from CRM [id number]. Topic: Referral - Question >> Feb 06, 2024  8:18 AM Avram MATSU wrote: Reason for CRM: patient would like a new referral for pain management, the current place she went to she is not happy with and stated she don't want to go back. Patient also stated she does not want to be referred to [place] and wold like to stay in [place].

## 2024-02-12 NOTE — Telephone Encounter (Signed)
 See below eye exam

## 2024-02-15 LAB — HM DIABETES EYE EXAM

## 2024-02-18 ENCOUNTER — Telehealth: Payer: Self-pay | Admitting: Internal Medicine

## 2024-02-18 NOTE — Telephone Encounter (Signed)
 Please advise     Copied from CRM [id number]. Topic: Referral - Question >> Feb 18, 2024  3:32 PM Selinda RAMAN wrote: Reason for CRM: The patient called in checking on the status of her pain management referral and after speaking with Vernell at [facility] she says she has tons of referrals on her desk and does not currently see the one for the patient. Can the referral please be resent and the patient wants to make sure her latest Bone Density and CT Chest Lung is sent with it. Please assist patient further.

## 2024-02-19 ENCOUNTER — Other Ambulatory Visit: Payer: Self-pay | Admitting: Internal Medicine

## 2024-02-19 ENCOUNTER — Encounter: Payer: Self-pay | Admitting: Internal Medicine

## 2024-02-19 NOTE — Telephone Encounter (Signed)
 I have faxed referral to [facility] as requested at this time - LCS and Bone Density have been included.  [facility] will reach out to the Patient for Appt Scheduling :)

## 2024-02-25 ENCOUNTER — Telehealth: Payer: Self-pay

## 2024-02-25 NOTE — Telephone Encounter (Signed)
 Copied from CRM [id number]. Topic: Referral - Question >> Feb 25, 2024  1:55 PM Sasha H wrote: Reason for CRM: Pt is wanting to know if she can be referred to a different pain management clinic as she has not heard from [facility]

## 2024-02-26 ENCOUNTER — Telehealth: Payer: Self-pay | Admitting: Internal Medicine

## 2024-02-26 NOTE — Telephone Encounter (Signed)
 Referral was just sent on 7/15.  Referral's can take up to 2 weeks for scheduling depending on Specialty.  I would allow [facility] more time to review and process referral at this time.

## 2024-02-26 NOTE — Telephone Encounter (Signed)
 Copied from CRM [id number]. Topic: Referral - Status >> Feb 26, 2024  1:24 PM Avram MATSU wrote: Reason for CRM: patient stated she was not able to reach anyone for her referral to [facility] to get an appt. She stated she would like to go to [facility] for pain management instead.   Phone:[phone]  Fax: [phone]

## 2024-02-27 NOTE — Telephone Encounter (Signed)
 Referral redirected as Patient requested.

## 2024-03-13 ENCOUNTER — Encounter (INDEPENDENT_AMBULATORY_CARE_PROVIDER_SITE_OTHER): Payer: Self-pay | Admitting: *Deleted

## 2024-03-19 ENCOUNTER — Other Ambulatory Visit: Payer: Self-pay | Admitting: Internal Medicine

## 2024-03-27 ENCOUNTER — Telehealth: Payer: Self-pay

## 2024-03-27 NOTE — Telephone Encounter (Signed)
 Copied from CRM #[id number]. Topic: Appointments - Appointment Cancel/Reschedule >> Mar 27, 2024 10:48 AM Charlet HERO wrote: Patient/patient representative is calling to reschedule an appointment. Refer to attachments for appointment information.  For August 27

## 2024-03-28 ENCOUNTER — Ambulatory Visit: Admitting: Internal Medicine

## 2024-04-02 ENCOUNTER — Ambulatory Visit: Admitting: Internal Medicine

## 2024-04-18 ENCOUNTER — Other Ambulatory Visit: Payer: Self-pay | Admitting: Internal Medicine

## 2024-04-18 DIAGNOSIS — I10 Essential (primary) hypertension: Secondary | ICD-10-CM

## 2024-04-22 ENCOUNTER — Ambulatory Visit: Admitting: Internal Medicine

## 2024-04-30 ENCOUNTER — Encounter: Payer: Self-pay | Admitting: Gastroenterology

## 2024-04-30 ENCOUNTER — Ambulatory Visit (INDEPENDENT_AMBULATORY_CARE_PROVIDER_SITE_OTHER): Admitting: Gastroenterology

## 2024-04-30 VITALS — BP 110/68 | HR 103 | Temp 98.7°F | Ht 63.0 in | Wt 210.0 lb

## 2024-04-30 DIAGNOSIS — K529 Noninfective gastroenteritis and colitis, unspecified: Secondary | ICD-10-CM

## 2024-04-30 DIAGNOSIS — Z9049 Acquired absence of other specified parts of digestive tract: Secondary | ICD-10-CM | POA: Diagnosis not present

## 2024-04-30 DIAGNOSIS — Z860101 Personal history of adenomatous and serrated colon polyps: Secondary | ICD-10-CM | POA: Diagnosis not present

## 2024-04-30 NOTE — Progress Notes (Signed)
 GI Office Note    Referring Provider: Tobie Suzzane POUR, MD Primary Care Physician:  Tobie Suzzane POUR, MD  Primary Gastroenterologist: Carlin POUR. Cindie, DO   Chief Complaint   Chief Complaint  Patient presents with   Colonoscopy     History of Present Illness   Stephanie Hammond is a 73 y.o. female presenting today to schedule colonoscopy. Last seen 11/2021. Having diarrhea at that time. She did not complete labs and stool tests ordered. Due for surveillance colonoscopy at this time.  History of recurrent diverticulitis with rectovaginal fistula status post segmental colon resection and fistula repair at [facility] January 2021. Last colonoscopy performed prior to surgical resection. See below.  Discussed the use of AI scribe software for clinical note transcription with the patient, who gave verbal consent to proceed.  She has experienced chronic diarrhea for several years, with episodes occurring after eating. Foods such as oatmeal, sausage, sandwiches, and eggs lead to multiple bowel movements shortly after consumption. She avoids certain foods like gravy and hamburger due to these symptoms. This morning, she had three bowel movements after eating a sausage biscuit with mustard.  In 2021, she underwent colon surgery where a portion of her colon was removed and a rectovaginal fistula was repaired. She has a history of polyps and was due for a five-year follow-up colonoscopy, which prompted the current visit.    No abdominal pain, cramps, vomiting, heartburn, or blood in the stool. Diarrhea does not occur at night, only after eating. She avoids eating out due to the urgency of her symptoms and sometimes skips meals to prevent diarrhea. She drinks a pot of black coffee in the morning but avoids it during the day to prevent symptoms.      Prior Data   Colonoscopy 04/2019: -one 6mm polyp in ascending colon  -severe pan-colonic diverticulosis with restricted mobility of  the rectosigmoid colon. Unable to appreciate colovaginal fistula. No mass -tortuous rectosigmoid colon requiring change in ultraslim scope -ext and int hemorrhoids.   Medications   Current Outpatient Medications  Medication Sig Dispense Refill   ACCU-CHEK GUIDE TEST test strip USE as directed TO monitor blood glucose four TIMES daily.] 200 strip 2   amLODipine  (NORVASC ) 10 MG tablet TAKE ONE TABLET BY MOUTH ONCE DAILY 90 tablet 1   atorvastatin  (LIPITOR) 20 MG tablet TAKE ONE TABLET BY MOUTH EVERY DAY 90 tablet 1   empagliflozin  (JARDIANCE ) 10 MG TABS tablet TAKE 1 TABLET (10MG  TOTAL) BY MOUTH DAILY BEFORE BREAKFAST 30 tablet 0   gabapentin  (NEURONTIN ) 100 MG capsule Take 200 mg by mouth at bedtime.     LANTUS  SOLOSTAR 100 UNIT/ML Solostar Pen Inject 30 Units into the skin daily. 15 mL 3   losartan  (COZAAR ) 100 MG tablet Take 1 tablet (100 mg total) by mouth daily. 90 tablet 1   MOUNJARO 7.5 MG/0.5ML Pen Inject 7.5 mg into the skin once a week.     NOVOLOG  FLEXPEN 100 UNIT/ML FlexPen Sliding Scale Blood Sugar of 150-200 = 2 units, 201-250 = 4units, 251-300 = 6units, 301-350 = 8units, 351-400 = 10units     omeprazole  (PRILOSEC) 20 MG capsule TAKE ONE CAPSULE BY MOUTH ONCE DAILY 30 MINUTES BEFORE morning MEAL 30 capsule 11   oxyCODONE -acetaminophen  (PERCOCET) 10-325 MG tablet Take 1 tablet by mouth 4 (four) times daily as needed.     Continuous Glucose Sensor (FREESTYLE LIBRE 3 PLUS SENSOR) MISC Change sensor every 15 days. (Patient not taking: Reported on 04/30/2024)  2 each 2   No current facility-administered medications for this visit.    Allergies   Allergies as of 04/30/2024 - Review Complete 04/30/2024  Allergen Reaction Noted   Ace inhibitors Cough 08/15/2012   Bentyl  [dicyclomine  hcl] Other (See Comments) 02/03/2013   Penicillins Itching and Swelling 03/28/2011    Past Medical History   Past Medical History:  Diagnosis Date   Anemia    Blood transfusion 2009   after GI  bleed   CAD (coronary artery disease)    Diabetes mellitus    2   Diverticulitis    Diverticulosis    Fear of local anesthetic    01-02-2019, patient states with last surgery in 2016 , she refused to have epidural before she was sedated d/t to extreme aversion to needles near her back     GERD (gastroesophageal reflux disease)    Gout    Headache    occ if sugar is  too hi or low    Heart murmur    i was told this years and years and years ago    History of GI diverticular bleed 08/2004   Hypertension    IBS (irritable bowel syndrome)    Osteoarthritis    Tachycardia    it stays like that for years between 119 and 121 and it doesnt bother me  none      Past Surgical History   Past Surgical History:  Procedure Laterality Date   CHOLECYSTECTOMY  2003   COLON SURGERY     COLONOSCOPY  2000   Dr. Aneita: marked diverticulosis, difficult procedure due to adhesions, polyps benign   COLONOSCOPY  2006   Dr. Abran: severe pandiverticulosis   COLONOSCOPY N/A 02/03/2013   SLF: 4 COLORECTAL POLYPS REMOVED/Moderate diverticulosis throughout the entire examined colon/Small internal hemorrhoids   COLONOSCOPY WITH PROPOFOL  N/A 04/29/2019   Procedure: COLONOSCOPY WITH PROPOFOL ;  Surgeon: Harvey Margo CROME, MD;  Location: [facility];  Service: Endoscopy;  Laterality: N/A;  8:30am   JOINT REPLACEMENT Right 2009   Knee Replacement Right    POLYPECTOMY  04/29/2019   Procedure: POLYPECTOMY;  Surgeon: Harvey Margo CROME, MD;  Location: [facility];  Service: Endoscopy;;  colon   PTCA     over 10 years ago , unsure of exact year , does recall that no stents were placed    REPLACEMENT TOTAL KNEE     right    TOTAL KNEE ARTHROPLASTY Left 03/12/2015   Procedure: LEFT TOTAL KNEE REPLACEMENT;  Surgeon: Taft FORBES Minerva, MD;  Location: [facility];  Service: Orthopedics;  Laterality: Left;   TOTAL KNEE REVISION Left 01/09/2019   Procedure: TOTAL KNEE REVISION;  Surgeon: Fidel Rogue, MD;  Location: [facility];   Service: Orthopedics;  Laterality: Left;   VAGINAL HYSTERECTOMY      Past Family History   Family History  Problem Relation Age of Onset   Colon polyps Sister    Cancer Mother        lung    Diabetes Mother    Cancer Father        stomach    Stomach cancer Father        questionable   Colon cancer Neg Hx     Past Social History   Social History   Socioeconomic History   Marital status: Married    Spouse name: Not on file   Number of children: 2   Years of education: Not on file   Highest education level: Not  on file  Occupational History   Not on file  Tobacco Use   Smoking status: Every Day    Current packs/day: 1.00    Average packs/day: 1 pack/day for 35.0 years (35.0 ttl pk-yrs)    Types: Cigarettes    Passive exposure: Current   Smokeless tobacco: Never  Vaping Use   Vaping status: Never Used  Substance and Sexual Activity   Alcohol  use: No   Drug use: No   Sexual activity: Not Currently    Birth control/protection: Surgical    Comment: hyst  Other Topics Concern   Not on file  Social History Narrative   Husband passed away lives alone    Social Drivers of Health   Financial Resource Strain: Low Risk  (03/03/2022)   Overall Financial Resource Strain (CARDIA)    Difficulty of Paying Living Expenses: Not hard at all  Food Insecurity: No Food Insecurity (03/03/2022)   Hunger Vital Sign    Worried About Running Out of Food in the Last Year: Never true    Ran Out of Food in the Last Year: Never true  Transportation Needs: No Transportation Needs (03/03/2022)   PRAPARE - Administrator, Civil Service (Medical): No    Lack of Transportation (Non-Medical): No  Physical Activity: Inactive (03/03/2022)   Exercise Vital Sign    Days of Exercise per Week: 0 days    Minutes of Exercise per Session: 0 min  Stress: No Stress Concern Present (03/03/2022)   [employer] of Occupational Health - Occupational Stress Questionnaire    Feeling of  Stress : Not at all  Social Connections: Moderately Isolated (03/03/2022)   Social Connection and Isolation Panel    Frequency of Communication with Friends and Family: More than three times a week    Frequency of Social Gatherings with Friends and Family: More than three times a week    Attends Religious Services: More than 4 times per year    Active Member of [employer] or Organizations: No    Attends Banker Meetings: Never    Marital Status: Widowed  Intimate Partner Violence: Not At Risk (03/03/2022)   Humiliation, Afraid, Rape, and Kick questionnaire    Fear of Current or Ex-Partner: No    Emotionally Abused: No    Physically Abused: No    Sexually Abused: No    Review of Systems   General: Negative for anorexia, weight loss, fever, chills, fatigue, weakness. Eyes: Negative for vision changes.  ENT: Negative for hoarseness, difficulty swallowing , nasal congestion. CV: Negative for chest pain, angina, palpitations, dyspnea on exertion, peripheral edema.  Respiratory: Negative for dyspnea at rest, dyspnea on exertion, cough, sputum, wheezing.  GI: See history of present illness. GU:  Negative for dysuria, hematuria, urinary incontinence, urinary frequency, nocturnal urination.  MS: Negative for joint pain, low back pain.  Derm: Negative for rash or itching.  Neuro: Negative for weakness, abnormal sensation, seizure, frequent headaches, memory loss,  confusion.  Psych: Negative for anxiety, depression, suicidal ideation, hallucinations.  Endo: Negative for unusual weight change.  Heme: Negative for bruising or bleeding. Allergy: Negative for rash or hives.  Physical Exam   BP 110/68 (BP Location: Right Arm, Patient Position: Sitting, Cuff Size: Large)   Pulse (!) 103   Temp 98.7 F (37.1 C) (Oral)   Ht 5' 3 (1.6 m)   Wt 210 lb (95.3 kg)   SpO2 91%   BMI 37.20 kg/m    General: Well-nourished, well-developed in no  acute distress.  Head: Normocephalic,  atraumatic.   Eyes: Conjunctiva pink, no icterus. Mouth: Oropharyngeal mucosa moist and pink  Neck: Supple without thyromegaly, masses, or lymphadenopathy.  Lungs: Clear to auscultation bilaterally.  Heart: Regular rate and rhythm, no murmurs rubs or gallops.  Abdomen: Bowel sounds are normal, nontender, nondistended, no hepatosplenomegaly or masses,  no abdominal bruits or hernia, no rebound or guarding.   Rectal: not performed Extremities: No lower extremity edema. No clubbing or deformities.  Neuro: Alert and oriented x 4 , grossly normal neurologically.  Skin: Warm and dry, no rash or jaundice.   Psych: Alert and cooperative, normal mood and affect.  Labs   Lab Results  Component Value Date   NA 143 06/11/2023   CL 106 06/11/2023   K 5.1 06/11/2023   CO2 22 06/11/2023   BUN 16 06/11/2023   CREATININE 1.01 (H) 06/11/2023   EGFR 60 06/11/2023   CALCIUM  9.3 06/11/2023   ALBUMIN 4.1 06/11/2023   GLUCOSE 147 (H) 06/11/2023   Lab Results  Component Value Date   ALT 14 06/11/2023   AST 12 06/11/2023   ALKPHOS 138 (H) 06/11/2023   BILITOT 0.3 06/11/2023   Lab Results  Component Value Date   WBC 5.9 01/29/2023   HGB 13.6 01/29/2023   HCT 42.3 01/29/2023   MCV 96 01/29/2023   PLT 362 01/29/2023   Lab Results  Component Value Date   TSH 0.816 06/11/2023   Lab Results  Component Value Date   HGBA1C 6.5 (H) 11/30/2023    Imaging Studies   No results found.  Assessment/Plan:    Adenomatous colon polyps and chronic diarrhea following colonic resection and rectovaginal fistula repair: Chronic diarrhea persists post-surgery, primarily postprandial. She did not complete work up planned in 2023. Due for surveillance colonoscopy for history of adenomatous colon polyps.    -colonoscopy with possible random colon biopsies. ASA .  I have discussed the risks, alternatives, benefits with regards to but not limited to the risk of reaction to medication, bleeding, infection,  perforation and the patient is agreeable to proceed. Written consent to be obtained. - Consider stool tests/labs if colonoscopy does not explain her diarrhea.     Sonny RAMAN. Ezzard, MHS, PA-C [facility]

## 2024-04-30 NOTE — Patient Instructions (Signed)
 Colonoscopy to be scheduled.   If nothing found on colonoscopy, we will complete stools and labs previously recommended by Dr. Cindie to evaluate your chronic diarrhea.

## 2024-05-01 ENCOUNTER — Encounter: Payer: Self-pay | Admitting: *Deleted

## 2024-05-01 ENCOUNTER — Other Ambulatory Visit: Payer: Self-pay | Admitting: *Deleted

## 2024-05-01 ENCOUNTER — Telehealth: Payer: Self-pay | Admitting: *Deleted

## 2024-05-01 MED ORDER — NA SULFATE-K SULFATE-MG SULF 17.5-3.13-1.6 GM/177ML PO SOLN: ORAL | 0 refills | Status: DC

## 2024-05-01 NOTE — Telephone Encounter (Signed)
 Pt has been scheduled for 06/02/24. Instructions mailed and prep sent to pharmacy.   [employer] PA: Information about your requested care Prior authorization is not required for this code [id number].

## 2024-05-01 NOTE — Telephone Encounter (Signed)
 LMOVM to return call  TCS w/Dr.Carver, asa 3.  Random colon biopsies

## 2024-05-02 ENCOUNTER — Other Ambulatory Visit: Payer: Self-pay | Admitting: Internal Medicine

## 2024-05-02 DIAGNOSIS — E1140 Type 2 diabetes mellitus with diabetic neuropathy, unspecified: Secondary | ICD-10-CM

## 2024-05-02 DIAGNOSIS — E785 Hyperlipidemia, unspecified: Secondary | ICD-10-CM

## 2024-05-09 ENCOUNTER — Ambulatory Visit: Admitting: Nurse Practitioner

## 2024-05-26 ENCOUNTER — Telehealth: Payer: Self-pay | Admitting: *Deleted

## 2024-05-26 NOTE — Telephone Encounter (Signed)
 Pt called in. Did not received instructions in the mail. Could not come pick up instructions from office. Discussed instructions in detail with patient over the phone. She voiced understanding.

## 2024-05-28 ENCOUNTER — Encounter (HOSPITAL_COMMUNITY): Payer: Self-pay

## 2024-05-28 ENCOUNTER — Encounter (HOSPITAL_COMMUNITY)
Admission: RE | Admit: 2024-05-28 | Discharge: 2024-05-28 | Disposition: A | Discharge status: Home / Self Care | Source: Ambulatory Visit | Attending: Internal Medicine | Admitting: Internal Medicine

## 2024-05-28 ENCOUNTER — Other Ambulatory Visit: Payer: Self-pay

## 2024-05-30 ENCOUNTER — Encounter: Payer: Self-pay | Admitting: *Deleted

## 2024-05-30 NOTE — Telephone Encounter (Signed)
 Pt called back and stated she already had another appointment on 06/13/24.She has been rescheduled to 06/27/24 at 10:45 am. Update instructions mailed.

## 2024-05-30 NOTE — Telephone Encounter (Signed)
 Pt rescheduled her procedure from 06/02/24 to 06/13/24 at 1 pm. She says she and her sister can't drive in the dark and it will be dark when she has to arrive at the hospital. Updated instructions mailed and pt informed if doesn't receive by 11/3 to call office back

## 2024-06-25 ENCOUNTER — Encounter (HOSPITAL_COMMUNITY)
Admission: RE | Admit: 2024-06-25 | Discharge: 2024-06-25 | Disposition: A | Discharge status: Home / Self Care | Source: Ambulatory Visit | Attending: Internal Medicine | Admitting: Internal Medicine

## 2024-06-27 ENCOUNTER — Encounter (HOSPITAL_COMMUNITY): Payer: Self-pay | Admitting: Internal Medicine

## 2024-06-27 ENCOUNTER — Ambulatory Visit (HOSPITAL_COMMUNITY)
Admission: RE | Admit: 2024-06-27 | Discharge: 2024-06-27 | Disposition: A | Discharge status: Home / Self Care | Attending: Internal Medicine | Admitting: Internal Medicine

## 2024-06-27 ENCOUNTER — Ambulatory Visit (HOSPITAL_COMMUNITY): Admitting: Anesthesiology

## 2024-06-27 ENCOUNTER — Encounter (HOSPITAL_COMMUNITY)
Admission: RE | Disposition: A | Discharge status: Home / Self Care | Payer: Self-pay | Source: Home / Self Care | Attending: Internal Medicine

## 2024-06-27 DIAGNOSIS — Z860101 Personal history of adenomatous and serrated colon polyps: Secondary | ICD-10-CM | POA: Diagnosis not present

## 2024-06-27 DIAGNOSIS — I2510 Atherosclerotic heart disease of native coronary artery without angina pectoris: Secondary | ICD-10-CM | POA: Diagnosis not present

## 2024-06-27 DIAGNOSIS — F17210 Nicotine dependence, cigarettes, uncomplicated: Secondary | ICD-10-CM | POA: Insufficient documentation

## 2024-06-27 DIAGNOSIS — Z7984 Long term (current) use of oral hypoglycemic drugs: Secondary | ICD-10-CM | POA: Insufficient documentation

## 2024-06-27 DIAGNOSIS — Z79899 Other long term (current) drug therapy: Secondary | ICD-10-CM | POA: Insufficient documentation

## 2024-06-27 DIAGNOSIS — Z1211 Encounter for screening for malignant neoplasm of colon: Secondary | ICD-10-CM

## 2024-06-27 DIAGNOSIS — Z794 Long term (current) use of insulin: Secondary | ICD-10-CM | POA: Insufficient documentation

## 2024-06-27 DIAGNOSIS — I10 Essential (primary) hypertension: Secondary | ICD-10-CM

## 2024-06-27 DIAGNOSIS — E1142 Type 2 diabetes mellitus with diabetic polyneuropathy: Secondary | ICD-10-CM | POA: Insufficient documentation

## 2024-06-27 DIAGNOSIS — K219 Gastro-esophageal reflux disease without esophagitis: Secondary | ICD-10-CM | POA: Insufficient documentation

## 2024-06-27 DIAGNOSIS — K648 Other hemorrhoids: Secondary | ICD-10-CM

## 2024-06-27 DIAGNOSIS — Z980 Intestinal bypass and anastomosis status: Secondary | ICD-10-CM | POA: Diagnosis not present

## 2024-06-27 DIAGNOSIS — K5730 Diverticulosis of large intestine without perforation or abscess without bleeding: Secondary | ICD-10-CM

## 2024-06-27 DIAGNOSIS — Z7985 Long-term (current) use of injectable non-insulin antidiabetic drugs: Secondary | ICD-10-CM | POA: Diagnosis not present

## 2024-06-27 DIAGNOSIS — F32A Depression, unspecified: Secondary | ICD-10-CM | POA: Diagnosis not present

## 2024-06-27 DIAGNOSIS — Z860100 Personal history of colon polyps, unspecified: Secondary | ICD-10-CM

## 2024-06-27 HISTORY — PX: COLONOSCOPY: SHX5424

## 2024-06-27 LAB — GLUCOSE, CAPILLARY: Glucose-Capillary: 125 mg/dL — ABNORMAL HIGH (ref 70–99)

## 2024-06-27 SURGERY — COLONOSCOPY
Anesthesia: General

## 2024-06-27 MED ORDER — PROPOFOL 10 MG/ML IV BOLUS
INTRAVENOUS | Status: DC | PRN
  Administered 2024-06-27: 150 ug/kg/min via INTRAVENOUS
  Administered 2024-06-27: 80 mg via INTRAVENOUS

## 2024-06-27 MED ORDER — LACTATED RINGERS IV SOLN: INTRAVENOUS | Status: DC | PRN

## 2024-06-27 MED ORDER — LACTATED RINGERS IV SOLN
INTRAVENOUS | Status: DC
  Administered 2024-06-27: 1000 mL via INTRAVENOUS

## 2024-06-27 NOTE — Op Note (Signed)
 [facility] Patient Name: Stephanie Hammond Procedure Date: 06/27/2024 10:27 AM MRN: [id number] Date of Birth: 10-31-1950 Attending MD: Carlin POUR. Cindie , [place], [id number] CSN: [id number] Age: 73 Admit Type: Outpatient Procedure:                Colonoscopy Indications:              High risk colon cancer surveillance: Personal                            history of colonic polyps Providers:                Carlin POUR. Cindie, DO, Emilee Tubb RN, RN, Madelin Hunter, RN Referring MD:              Medicines:                See the Anesthesia note for documentation of the                            administered medications Complications:            No immediate complications. Estimated Blood Loss:     Estimated blood loss was minimal. Procedure:                Pre-Anesthesia Assessment:                           - The anesthesia plan was to use monitored                            anesthesia care (MAC).                           After obtaining informed consent, the colonoscope                            was passed under direct vision. Throughout the                            procedure, the patient's blood pressure, pulse, and                            oxygen  saturations were monitored continuously. The                            PCF-HQ190L ([id number]) Peds Colon was introduced                            through the anus and advanced to the the cecum,                            identified by appendiceal orifice and ileocecal                            valve. The colonoscopy was performed without  difficulty. The patient tolerated the procedure                            well. The quality of the bowel preparation was                            evaluated using the BBPS [facility] Bowel Preparation                            Scale) with scores of: Right Colon = 2 (minor                            amount of residual staining, small fragments of                             stool and/or opaque liquid, but mucosa seen well),                            Transverse Colon = 2 (minor amount of residual                            staining, small fragments of stool and/or opaque                            liquid, but mucosa seen well) and Left Colon = 2                            (minor amount of residual staining, small fragments                            of stool and/or opaque liquid, but mucosa seen                            well). The total BBPS score equals 6. The quality                            of the bowel preparation was good. Scope In: 10:40:38 AM Scope Out: 10:53:27 AM Scope Withdrawal Time: 0 hours 9 minutes 50 seconds  Total Procedure Duration: 0 hours 12 minutes 49 seconds  Findings:      Non-bleeding internal hemorrhoids were found.      Many large-mouthed and small-mouthed diverticula were found in the       entire colon.      There was evidence of a prior side-to-side colo-colonic anastomosis in       the sigmoid colon. This was patent and was characterized by healthy       appearing mucosa. The anastomosis was traversed.      Biopsies for histology were taken with a cold forceps from the ascending       colon, transverse colon and descending colon for evaluation of       microscopic colitis.      The exam was otherwise without abnormality. Impression:               - Non-bleeding internal hemorrhoids.                           -  Diverticulosis in the entire examined colon.                           - Patent side-to-side colo-colonic anastomosis,                            characterized by healthy appearing mucosa.                           - The examination was otherwise normal.                           - Biopsies were taken with a cold forceps from the                            ascending colon, transverse colon and descending                            colon for evaluation of microscopic colitis. Moderate  Sedation:      Per Anesthesia Care Recommendation:           - Patient has a contact number available for                            emergencies. The signs and symptoms of potential                            delayed complications were discussed with the                            patient. Return to normal activities tomorrow.                            Written discharge instructions were provided to the                            patient.                           - Resume previous diet.                           - Continue present medications.                           - Await pathology results.                           - Repeat colonoscopy in 5 years for surveillance.                           - Return to GI clinic in 3 months. Procedure Code(s):        --- Professional ---                           [phone], Colonoscopy, flexible; with biopsy, single  or multiple Diagnosis Code(s):        --- Professional ---                           Z86.010, Personal history of colonic polyps                           K64.8, Other hemorrhoids                           Z98.0, Intestinal bypass and anastomosis status                           K57.30, Diverticulosis of large intestine without                            perforation or abscess without bleeding CPT copyright 2022 American Medical Association. All rights reserved. The codes documented in this report are preliminary and upon coder review may  be revised to meet current compliance requirements. Carlin POUR. Cindie, DO Carlin POUR. Cindie, DO 06/27/2024 10:58:47 AM This report has been signed electronically. Number of Addenda: 0

## 2024-06-27 NOTE — Discharge Instructions (Addendum)
  Colonoscopy Discharge Instructions  Read the instructions outlined below and refer to this sheet in the next few weeks. These discharge instructions provide you with general information on caring for yourself after you leave the hospital. Your doctor may also give you specific instructions. While your treatment has been planned according to the most current medical practices available, unavoidable complications occasionally occur.   ACTIVITY You may resume your regular activity, but move at a slower pace for the next 24 hours.  Take frequent rest periods for the next 24 hours.  Walking will help get rid of the air and reduce the bloated feeling in your belly (abdomen).  No driving for 24 hours (because of the medicine (anesthesia) used during the test).   Do not sign any important legal documents or operate any machinery for 24 hours (because of the anesthesia used during the test).  NUTRITION Drink plenty of fluids.  You may resume your normal diet as instructed by your doctor.  Begin with a light meal and progress to your normal diet. Heavy or fried foods are harder to digest and may make you feel sick to your stomach (nauseated).  Avoid alcoholic beverages for 24 hours or as instructed.  MEDICATIONS You may resume your normal medications unless your doctor tells you otherwise.  WHAT YOU CAN EXPECT TODAY Some feelings of bloating in the abdomen.  Passage of more gas than usual.  Spotting of blood in your stool or on the toilet paper.  IF YOU HAD POLYPS REMOVED DURING THE COLONOSCOPY: No aspirin  products for 7 days or as instructed.  No alcohol  for 7 days or as instructed.  Eat a soft diet for the next 24 hours.  FINDING OUT THE RESULTS OF YOUR TEST Not all test results are available during your visit. If your test results are not back during the visit, make an appointment with your caregiver to find out the results. Do not assume everything is normal if you have not heard from your  caregiver or the medical facility. It is important for you to follow up on all of your test results.  SEEK IMMEDIATE MEDICAL ATTENTION IF: You have more than a spotting of blood in your stool.  Your belly is swollen (abdominal distention).  You are nauseated or vomiting.  You have a temperature over 101.  You have abdominal pain or discomfort that is severe or gets worse throughout the day.   Your colonoscopy was relatively unremarkable.  I did not find any polyps or evidence of colon cancer.  I recommend repeating colonoscopy in 5 years for surveillance purposes.  Overall, your colon appeared very healthy.  I did not see any active inflammation indicative of underlying inflammatory bowel disease such as Crohn's disease or ulcerative colitis throughout your colon.  I took biopsies of your colon to further evaluate your diarrhea.  Await pathology results, my office will contact you.  You do have diverticulosis and internal hemorrhoids. I would recommend increasing fiber in your diet or adding OTC Benefiber/Metamucil. Be sure to drink at least 4 to 6 glasses of water  daily.   Follow-up with GI in 3 months   I hope you have a great rest of your week!  Carlin POUR. Cindie, D.O. Gastroenterology and Hepatology [facility]

## 2024-06-27 NOTE — Transfer of Care (Signed)
 Immediate Anesthesia Transfer of Care Note  Patient: Stephanie Hammond  Procedure(s) Performed: COLONOSCOPY  Patient Location: Short Stay  Anesthesia Type:General  Level of Consciousness: awake  Airway & Oxygen  Therapy: Patient Spontanous Breathing  Post-op Assessment: Report given to RN and Post -op Vital signs reviewed and stable  Post vital signs: Reviewed and stable  Last Vitals:  Vitals Value Taken Time  BP 96/44   Temp 97.6   Pulse 85   Resp 14   SpO2 98%     Last Pain:  Vitals:   06/27/24 1035  TempSrc:   PainSc: 0-No pain      Patients Stated Pain Goal: 8 (06/27/24 0946)  Complications: No notable events documented.

## 2024-06-27 NOTE — H&P (Signed)
 Primary Care Physician:  Tobie Suzzane POUR, MD Primary Gastroenterologist:  Dr. Cindie  Pre-Procedure History & Physical: HPI:  Stephanie Hammond is a 73 y.o. female is here for a colonoscopy to be performed for surveillance purposes, personal history of adenomatous colon polyps  Past Medical History:  Diagnosis Date   Anemia    Blood transfusion 2009   after GI bleed   CAD (coronary artery disease)    Diabetes mellitus    2   Diverticulitis    Diverticulosis    Fear of local anesthetic    01-02-2019, patient states with last surgery in 2016 , she refused to have epidural before she was sedated d/t to extreme aversion to needles near her back     GERD (gastroesophageal reflux disease)    Gout    Headache    occ if sugar is  too hi or low    Heart murmur    i was told this years and years and years ago    History of GI diverticular bleed 08/2004   Hypertension    IBS (irritable bowel syndrome)    Osteoarthritis    Tachycardia    it stays like that for years between 119 and 121 and it doesnt bother me  none      Past Surgical History:  Procedure Laterality Date   CATARACT EXTRACTION Bilateral    CHOLECYSTECTOMY  2003   COLON SURGERY     COLONOSCOPY  2000   Dr. Aneita: marked diverticulosis, difficult procedure due to adhesions, polyps benign   COLONOSCOPY  2006   Dr. Abran: severe pandiverticulosis   COLONOSCOPY N/A 02/03/2013   SLF: 4 COLORECTAL POLYPS REMOVED/Moderate diverticulosis throughout the entire examined colon/Small internal hemorrhoids   COLONOSCOPY WITH PROPOFOL  N/A 04/29/2019   Procedure: COLONOSCOPY WITH PROPOFOL ;  Surgeon: Harvey Margo CROME, MD;  Location: [facility];  Service: Endoscopy;  Laterality: N/A;  8:30am   JOINT REPLACEMENT Right 2009   Knee Replacement Right    POLYPECTOMY  04/29/2019   Procedure: POLYPECTOMY;  Surgeon: Harvey Margo CROME, MD;  Location: [facility];  Service: Endoscopy;;  colon   PTCA     over 10 years ago , unsure of exact  year , does recall that no stents were placed    REPLACEMENT TOTAL KNEE     right    TOTAL KNEE ARTHROPLASTY Left 03/12/2015   Procedure: LEFT TOTAL KNEE REPLACEMENT;  Surgeon: Taft FORBES Minerva, MD;  Location: [facility];  Service: Orthopedics;  Laterality: Left;   TOTAL KNEE REVISION Left 01/09/2019   Procedure: TOTAL KNEE REVISION;  Surgeon: Fidel Rogue, MD;  Location: [facility];  Service: Orthopedics;  Laterality: Left;   VAGINAL HYSTERECTOMY      Prior to Admission medications   Medication Sig Start Date End Date Taking? Authorizing Provider  ACCU-CHEK GUIDE TEST test strip USE as directed TO monitor blood glucose four TIMES daily.] 10/24/23  Yes Melvenia Manus FORBES, MD  amLODipine  (NORVASC ) 10 MG tablet TAKE ONE TABLET BY MOUTH ONCE DAILY 04/18/24  Yes Tobie Suzzane POUR, MD  atorvastatin  (LIPITOR) 20 MG tablet TAKE ONE TABLET BY MOUTH EVERY DAY 05/02/24  Yes Tobie Suzzane POUR, MD  Continuous Glucose Sensor (FREESTYLE LIBRE 3 PLUS SENSOR) MISC Change sensor every 15 days. 05/14/23  Yes Nida, Gebreselassie W, MD  gabapentin  (NEURONTIN ) 100 MG capsule Take 200 mg by mouth at bedtime.   Yes [provider]  LANTUS  SOLOSTAR 100 UNIT/ML Solostar Pen Inject 30 Units into the  skin daily. 12/17/23  Yes Tobie Suzzane POUR, MD  losartan  (COZAAR ) 100 MG tablet Take 1 tablet (100 mg total) by mouth daily. 02/01/24  Yes Tobie Suzzane POUR, MD  Na Sulfate-K Sulfate-Mg Sulfate concentrate (SUPREP) 17.5-3.13-1.6 GM/177ML SOLN As directed 05/01/24  Yes Tiffnay Bossi, Carlin POUR, DO  NOVOLOG  FLEXPEN 100 UNIT/ML FlexPen Sliding Scale Blood Sugar of 150-200 = 2 units, 201-250 = 4units, 251-300 = 6units, 301-350 = 8units, 351-400 = 10units   Yes [provider]  omeprazole  (PRILOSEC) 20 MG capsule TAKE ONE CAPSULE BY MOUTH ONCE DAILY 30 MINUTES BEFORE morning MEAL 02/19/24  Yes Tobie Suzzane POUR, MD  oxyCODONE -acetaminophen  (PERCOCET) 10-325 MG tablet Take 1 tablet by mouth 4 (four) times daily as needed. 04/14/24  Yes  [provider]  empagliflozin  (JARDIANCE ) 10 MG TABS tablet TAKE 1 TABLET (10MG  TOTAL) BY MOUTH DAILY BEFORE BREAKFAST 09/27/23   Nida, Gebreselassie W, MD  MOUNJARO 7.5 MG/0.5ML Pen Inject 7.5 mg into the skin once a week. 12/24/23   [provider]    Allergies as of 05/01/2024 - Review Complete 04/30/2024  Allergen Reaction Noted   Ace inhibitors Cough 08/15/2012   Bentyl  [dicyclomine  hcl] Other (See Comments) 02/03/2013   Penicillins Itching and Swelling 03/28/2011    Family History  Problem Relation Age of Onset   Colon polyps Sister    Cancer Mother        lung    Diabetes Mother    Cancer Father        stomach    Stomach cancer Father        questionable   Colon cancer Neg Hx     Social History   Socioeconomic History   Marital status: Married    Spouse name: Not on file   Number of children: 2   Years of education: Not on file   Highest education level: Not on file  Occupational History   Not on file  Tobacco Use   Smoking status: Every Day    Current packs/day: 1.00    Average packs/day: 1 pack/day for 35.0 years (35.0 ttl pk-yrs)    Types: Cigarettes    Passive exposure: Current   Smokeless tobacco: Never  Vaping Use   Vaping status: Never Used  Substance and Sexual Activity   Alcohol  use: No   Drug use: No   Sexual activity: Not Currently    Birth control/protection: Surgical    Comment: hyst  Other Topics Concern   Not on file  Social History Narrative   Husband passed away lives alone    Social Drivers of Health   Financial Resource Strain: Low Risk  (03/03/2022)   Overall Financial Resource Strain (CARDIA)    Difficulty of Paying Living Expenses: Not hard at all  Food Insecurity: No Food Insecurity (03/03/2022)   Hunger Vital Sign    Worried About Running Out of Food in the Last Year: Never true    Ran Out of Food in the Last Year: Never true  Transportation Needs: No Transportation Needs (03/03/2022)   PRAPARE -  Administrator, Civil Service (Medical): No    Lack of Transportation (Non-Medical): No  Physical Activity: Inactive (03/03/2022)   Exercise Vital Sign    Days of Exercise per Week: 0 days    Minutes of Exercise per Session: 0 min  Stress: No Stress Concern Present (03/03/2022)   [employer] of Occupational Health - Occupational Stress Questionnaire    Feeling of Stress : Not at all  Social Connections: Moderately Isolated (03/03/2022)   Social Connection and Isolation Panel    Frequency of Communication with Friends and Family: More than three times a week    Frequency of Social Gatherings with Friends and Family: More than three times a week    Attends Religious Services: More than 4 times per year    Active Member of [employer] or Organizations: No    Attends Banker Meetings: Never    Marital Status: Widowed  Intimate Partner Violence: Not At Risk (03/03/2022)   Humiliation, Afraid, Rape, and Kick questionnaire    Fear of Current or Ex-Partner: No    Emotionally Abused: No    Physically Abused: No    Sexually Abused: No    Review of Systems: See HPI, otherwise negative ROS  Physical Exam: Vital signs in last 24 hours:     General:   Alert,  Well-developed, well-nourished, pleasant and cooperative in NAD Head:  Normocephalic and atraumatic. Eyes:  Sclera clear, no icterus.   Conjunctiva pink. Ears:  Normal auditory acuity. Nose:  No deformity, discharge,  or lesions. Msk:  Symmetrical without gross deformities. Normal posture. Extremities:  Without clubbing or edema. Neurologic:  Alert and  oriented x4;  grossly normal neurologically. Skin:  Intact without significant lesions or rashes. Psych:  Alert and cooperative. Normal mood and affect.  Impression/Plan: Stephanie Hammond is here for a colonoscopy to be performed for surveillance purposes, personal history of adenomatous colon polyps   The risks of the procedure including infection, bleed, or  perforation as well as benefits, limitations, alternatives and imponderables have been reviewed with the patient. Questions have been answered. All parties agreeable.

## 2024-06-27 NOTE — Anesthesia Postprocedure Evaluation (Signed)
 Anesthesia Post Note  Patient: Stephanie Hammond  Procedure(s) Performed: COLONOSCOPY  Patient location during evaluation: Phase II Anesthesia Type: General Level of consciousness: awake and alert Pain management: pain level controlled Vital Signs Assessment: post-procedure vital signs reviewed and stable Respiratory status: spontaneous breathing, nonlabored ventilation and respiratory function stable Cardiovascular status: stable Anesthetic complications: no   There were no known notable events for this encounter.   Last Vitals:  Vitals:   06/27/24 0946 06/27/24 1101  BP: (!) 123/53 (!) 91/58  Pulse:  84  Resp: 19 (!) 23  Temp: 36.9 C 36.4 C  SpO2: 98% 95%    Last Pain:  Vitals:   06/27/24 1101  TempSrc: Oral  PainSc: 0-No pain                 Eurika Sandy L Coriana Angello

## 2024-06-27 NOTE — Anesthesia Preprocedure Evaluation (Signed)
 Anesthesia Evaluation  Patient identified by MRN, date of birth, ID band Patient awake    Reviewed: Allergy & Precautions, NPO status , Patient's Chart, lab work & pertinent test results  Airway Mallampati: II  TM Distance: >3 FB Neck ROM: Full    Dental no notable dental hx. (+) Teeth Intact, Dental Advisory Given   Pulmonary neg pulmonary ROS, Current Smoker and Patient abstained from smoking.   Pulmonary exam normal breath sounds clear to auscultation       Cardiovascular Exercise Tolerance: Poor hypertension, + CAD  Normal cardiovascular examI Rhythm:Regular Rate:Normal  S/p 3 TKAs - still with pain in Left knee Denies CP/DOE   Neuro/Psych  Headaches PSYCHIATRIC DISORDERS  Depression    neuropathy  Neuromuscular disease    GI/Hepatic Neg liver ROS,GERD  Medicated and Controlled,,Known RVF found on CT -here for Flex sig with Dr. Harvey   Endo/Other  negative endocrine ROSdiabetes, Type 2    Renal/GU negative Renal ROS  negative genitourinary   Musculoskeletal  (+) Arthritis , Osteoarthritis,    Abdominal   Peds negative pediatric ROS (+)  Hematology negative hematology ROS (+) Blood dyscrasia, anemia   Anesthesia Other Findings   Reproductive/Obstetrics negative OB ROS                              Anesthesia Physical Anesthesia Plan  ASA: 3  Anesthesia Plan: General   Post-op Pain Management: Minimal or no pain anticipated   Induction: Intravenous  PONV Risk Score and Plan: 2 and Propofol  infusion  Airway Management Planned: Nasal Cannula and Natural Airway  Additional Equipment: None  Intra-op Plan:   Post-operative Plan:   Informed Consent: I have reviewed the patients History and Physical, chart, labs and discussed the procedure including the risks, benefits and alternatives for the proposed anesthesia with the patient or authorized representative who has indicated  his/her understanding and acceptance.     Dental advisory given  Plan Discussed with: CRNA  Anesthesia Plan Comments:          Anesthesia Quick Evaluation

## 2024-06-30 ENCOUNTER — Other Ambulatory Visit: Payer: Self-pay | Admitting: Internal Medicine

## 2024-06-30 LAB — SURGICAL PATHOLOGY

## 2024-07-02 ENCOUNTER — Encounter (HOSPITAL_COMMUNITY): Payer: Self-pay | Admitting: Internal Medicine

## 2024-07-17 ENCOUNTER — Telehealth: Payer: Self-pay | Admitting: Pharmacy Technician

## 2024-07-17 ENCOUNTER — Other Ambulatory Visit (HOSPITAL_COMMUNITY): Payer: Self-pay

## 2024-07-17 NOTE — Telephone Encounter (Signed)
 Pharmacy Patient Advocate Encounter   Received notification from CoverMyMeds that prior authorization for Ozempic  (2 MG/DOSE) 8MG /3ML pen-injectors is due for renewal.   Insurance verification completed.   The patient is insured through [employer] [place].  Action: Medication has been discontinued. Archived Key: [id number]

## 2024-07-23 ENCOUNTER — Encounter

## 2024-07-25 ENCOUNTER — Encounter

## 2024-07-25 ENCOUNTER — Ambulatory Visit: Admitting: Internal Medicine

## 2024-08-01 ENCOUNTER — Other Ambulatory Visit: Payer: Self-pay | Admitting: Internal Medicine

## 2024-08-01 DIAGNOSIS — I10 Essential (primary) hypertension: Secondary | ICD-10-CM

## 2024-08-12 ENCOUNTER — Encounter: Payer: Self-pay | Admitting: Gastroenterology

## 2024-08-27 ENCOUNTER — Other Ambulatory Visit: Payer: Self-pay | Admitting: Internal Medicine

## 2024-08-27 DIAGNOSIS — E1140 Type 2 diabetes mellitus with diabetic neuropathy, unspecified: Secondary | ICD-10-CM

## 2024-09-23 ENCOUNTER — Ambulatory Visit

## 2024-09-25 ENCOUNTER — Ambulatory Visit: Admitting: Internal Medicine

## 2024-10-14 ENCOUNTER — Ambulatory Visit: Admitting: Gastroenterology
